# Patient Record
Sex: Female | Born: 1935
Health system: Southern US, Community
[De-identification: ages and names within clinical notes are randomized; demographics above are authoritative.]

## PROBLEM LIST (undated history)

## (undated) DIAGNOSIS — IMO0002 Reserved for concepts with insufficient information to code with codable children: Secondary | ICD-10-CM

## (undated) DIAGNOSIS — H5440 Blindness, one eye, unspecified eye: Secondary | ICD-10-CM

## (undated) DIAGNOSIS — S0591XA Unspecified injury of right eye and orbit, initial encounter: Secondary | ICD-10-CM

## (undated) DIAGNOSIS — K5790 Diverticulosis of intestine, part unspecified, without perforation or abscess without bleeding: Secondary | ICD-10-CM

## (undated) DIAGNOSIS — I10 Essential (primary) hypertension: Secondary | ICD-10-CM

## (undated) DIAGNOSIS — E785 Hyperlipidemia, unspecified: Secondary | ICD-10-CM

## (undated) DIAGNOSIS — K5904 Chronic idiopathic constipation: Secondary | ICD-10-CM

## (undated) DIAGNOSIS — K219 Gastro-esophageal reflux disease without esophagitis: Secondary | ICD-10-CM

## (undated) DIAGNOSIS — K222 Esophageal obstruction: Secondary | ICD-10-CM

## (undated) HISTORY — DX: Essential (primary) hypertension: I10

## (undated) HISTORY — DX: Reserved for concepts with insufficient information to code with codable children: IMO0002

## (undated) HISTORY — PX: EYE SURGERY: SHX253

## (undated) HISTORY — DX: Diverticulosis of intestine, part unspecified, without perforation or abscess without bleeding: K57.90

## (undated) HISTORY — DX: Unspecified injury of right eye and orbit, initial encounter: S05.91XA

## (undated) HISTORY — PX: BREAST REDUCTION SURGERY: SHX8

## (undated) HISTORY — DX: Esophageal obstruction: K22.2

## (undated) HISTORY — DX: Hyperlipidemia, unspecified: E78.5

## (undated) HISTORY — PX: TOTAL ABDOMINAL HYSTERECTOMY W/ BILATERAL SALPINGOOPHORECTOMY: SHX83

## (undated) HISTORY — DX: Blindness, one eye, unspecified eye: H54.40

## (undated) HISTORY — DX: Chronic idiopathic constipation: K59.04

## (undated) HISTORY — PX: ABDOMINAL HYSTERECTOMY: SHX81

---
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
## 1998-02-13 ENCOUNTER — Ambulatory Visit (HOSPITAL_COMMUNITY): Admission: RE | Admit: 1998-02-13 | Discharge: 1998-02-13 | Payer: Self-pay

## 1999-02-14 ENCOUNTER — Ambulatory Visit (HOSPITAL_COMMUNITY): Admission: RE | Admit: 1999-02-14 | Discharge: 1999-02-14 | Payer: Self-pay

## 2000-02-18 ENCOUNTER — Ambulatory Visit (HOSPITAL_COMMUNITY): Admission: RE | Admit: 2000-02-18 | Discharge: 2000-02-18 | Payer: Self-pay | Admitting: Family Medicine

## 2000-02-18 ENCOUNTER — Encounter: Payer: Self-pay | Admitting: Family Medicine

## 2001-02-26 ENCOUNTER — Encounter: Payer: Self-pay | Admitting: Family Medicine

## 2001-02-26 ENCOUNTER — Ambulatory Visit (HOSPITAL_COMMUNITY): Admission: RE | Admit: 2001-02-26 | Discharge: 2001-02-26 | Payer: Self-pay | Admitting: Family Medicine

## 2001-12-28 ENCOUNTER — Other Ambulatory Visit: Admission: RE | Admit: 2001-12-28 | Discharge: 2001-12-28 | Payer: Self-pay | Admitting: Family Medicine

## 2002-03-30 ENCOUNTER — Encounter: Payer: Self-pay | Admitting: Family Medicine

## 2002-03-30 ENCOUNTER — Ambulatory Visit (HOSPITAL_COMMUNITY): Admission: RE | Admit: 2002-03-30 | Discharge: 2002-03-30 | Payer: Self-pay | Admitting: Family Medicine

## 2003-03-29 ENCOUNTER — Encounter: Payer: Self-pay | Admitting: Family Medicine

## 2003-03-29 ENCOUNTER — Ambulatory Visit (HOSPITAL_COMMUNITY): Admission: RE | Admit: 2003-03-29 | Discharge: 2003-03-29 | Payer: Self-pay | Admitting: Family Medicine

## 2003-04-06 ENCOUNTER — Ambulatory Visit (HOSPITAL_COMMUNITY): Admission: RE | Admit: 2003-04-06 | Discharge: 2003-04-06 | Payer: Self-pay | Admitting: Family Medicine

## 2003-04-06 ENCOUNTER — Encounter: Payer: Self-pay | Admitting: Family Medicine

## 2003-07-05 ENCOUNTER — Ambulatory Visit (HOSPITAL_COMMUNITY): Admission: RE | Admit: 2003-07-05 | Discharge: 2003-07-05 | Payer: Self-pay | Admitting: Family Medicine

## 2003-07-05 ENCOUNTER — Encounter: Payer: Self-pay | Admitting: Family Medicine

## 2003-08-05 ENCOUNTER — Encounter: Payer: Self-pay | Admitting: Family Medicine

## 2003-08-05 ENCOUNTER — Ambulatory Visit (HOSPITAL_COMMUNITY): Admission: RE | Admit: 2003-08-05 | Discharge: 2003-08-05 | Payer: Self-pay | Admitting: Family Medicine

## 2004-04-25 ENCOUNTER — Ambulatory Visit (HOSPITAL_COMMUNITY): Admission: RE | Admit: 2004-04-25 | Discharge: 2004-04-25 | Payer: Self-pay | Admitting: Family Medicine

## 2004-05-07 ENCOUNTER — Ambulatory Visit (HOSPITAL_COMMUNITY): Admission: RE | Admit: 2004-05-07 | Discharge: 2004-05-07 | Payer: Self-pay | Admitting: Ophthalmology

## 2004-10-15 ENCOUNTER — Ambulatory Visit (HOSPITAL_COMMUNITY): Admission: RE | Admit: 2004-10-15 | Discharge: 2004-10-15 | Payer: Self-pay | Admitting: Ophthalmology

## 2004-10-20 ENCOUNTER — Observation Stay (HOSPITAL_COMMUNITY): Admission: EM | Admit: 2004-10-20 | Discharge: 2004-10-20 | Payer: Self-pay | Admitting: Emergency Medicine

## 2004-11-27 ENCOUNTER — Ambulatory Visit: Payer: Self-pay | Admitting: Family Medicine

## 2005-03-12 ENCOUNTER — Ambulatory Visit: Payer: Self-pay | Admitting: Family Medicine

## 2005-03-27 ENCOUNTER — Ambulatory Visit (HOSPITAL_COMMUNITY): Admission: RE | Admit: 2005-03-27 | Discharge: 2005-03-27 | Payer: Self-pay | Admitting: Family Medicine

## 2005-04-29 ENCOUNTER — Ambulatory Visit (HOSPITAL_COMMUNITY): Admission: RE | Admit: 2005-04-29 | Discharge: 2005-04-29 | Payer: Self-pay | Admitting: Family Medicine

## 2005-11-26 ENCOUNTER — Ambulatory Visit: Payer: Self-pay | Admitting: Family Medicine

## 2005-12-11 ENCOUNTER — Ambulatory Visit: Payer: Self-pay | Admitting: Family Medicine

## 2006-03-03 ENCOUNTER — Ambulatory Visit: Payer: Self-pay | Admitting: Family Medicine

## 2006-03-27 ENCOUNTER — Other Ambulatory Visit: Admission: RE | Admit: 2006-03-27 | Discharge: 2006-03-27 | Payer: Self-pay | Admitting: Family Medicine

## 2006-03-27 ENCOUNTER — Ambulatory Visit: Payer: Self-pay | Admitting: Family Medicine

## 2006-03-27 ENCOUNTER — Encounter: Payer: Self-pay | Admitting: Family Medicine

## 2006-04-30 ENCOUNTER — Ambulatory Visit (HOSPITAL_COMMUNITY): Admission: RE | Admit: 2006-04-30 | Discharge: 2006-04-30 | Payer: Self-pay | Admitting: Family Medicine

## 2006-09-09 ENCOUNTER — Ambulatory Visit: Payer: Self-pay | Admitting: Pediatrics

## 2007-01-01 ENCOUNTER — Ambulatory Visit: Payer: Self-pay | Admitting: Family Medicine

## 2007-02-05 ENCOUNTER — Encounter: Payer: Self-pay | Admitting: Family Medicine

## 2007-02-05 LAB — CONVERTED CEMR LAB
ALT: 22 units/L (ref 0–35)
Albumin: 4.3 g/dL (ref 3.5–5.2)
Alkaline Phosphatase: 122 units/L — ABNORMAL HIGH (ref 39–117)
BUN: 21 mg/dL (ref 6–23)
Chloride: 106 meq/L (ref 96–112)
Cholesterol: 188 mg/dL (ref 0–200)
Creatinine, Ser: 0.94 mg/dL (ref 0.40–1.20)
Glucose, Bld: 89 mg/dL (ref 70–99)
HDL: 82 mg/dL (ref >39)
Total Protein: 6.8 g/dL (ref 6.0–8.3)
Triglycerides: 139 mg/dL (ref <150)

## 2007-03-28 ENCOUNTER — Encounter (INDEPENDENT_AMBULATORY_CARE_PROVIDER_SITE_OTHER): Payer: Self-pay | Admitting: *Deleted

## 2007-03-28 LAB — CONVERTED CEMR LAB: Pap Smear: NORMAL

## 2007-04-16 ENCOUNTER — Encounter: Payer: Self-pay | Admitting: Family Medicine

## 2007-04-16 ENCOUNTER — Other Ambulatory Visit: Admission: RE | Admit: 2007-04-16 | Discharge: 2007-04-16 | Payer: Self-pay | Admitting: Family Medicine

## 2007-04-16 ENCOUNTER — Ambulatory Visit: Payer: Self-pay | Admitting: Family Medicine

## 2007-05-04 ENCOUNTER — Ambulatory Visit (HOSPITAL_COMMUNITY): Admission: RE | Admit: 2007-05-04 | Discharge: 2007-05-04 | Payer: Self-pay | Admitting: Family Medicine

## 2007-06-11 ENCOUNTER — Ambulatory Visit: Payer: Self-pay | Admitting: Family Medicine

## 2007-06-11 LAB — CONVERTED CEMR LAB
Basophils Absolute: 0 10*3/uL (ref 0.0–0.1)
Basophils Relative: 0 % (ref 0–1)
Creatinine, Ser: 0.9 mg/dL (ref 0.40–1.20)
Eosinophils Absolute: 0.1 10*3/uL (ref 0.0–0.7)
Eosinophils Relative: 1 % (ref 0–5)
HCT: 40.5 % (ref 36.0–46.0)
Lymphs Abs: 1.6 10*3/uL (ref 0.7–3.3)
Monocytes Absolute: 0.6 10*3/uL (ref 0.2–0.7)
Neutrophils Relative %: 59 % (ref 43–77)
Potassium: 4.3 meq/L (ref 3.5–5.3)
RBC: 4.58 M/uL (ref 3.87–5.11)
Sodium: 140 meq/L (ref 135–145)

## 2007-07-09 ENCOUNTER — Ambulatory Visit: Payer: Self-pay | Admitting: Family Medicine

## 2007-07-16 ENCOUNTER — Ambulatory Visit (HOSPITAL_COMMUNITY): Admission: RE | Admit: 2007-07-16 | Discharge: 2007-07-16 | Payer: Self-pay | Admitting: Family Medicine

## 2007-08-20 ENCOUNTER — Ambulatory Visit: Payer: Self-pay | Admitting: Family Medicine

## 2007-10-12 ENCOUNTER — Encounter: Payer: Self-pay | Admitting: Family Medicine

## 2007-10-12 LAB — CONVERTED CEMR LAB
ALT: 26 units/L (ref 0–35)
AST: 22 units/L (ref 0–37)
Albumin: 4.3 g/dL (ref 3.5–5.2)
Alkaline Phosphatase: 82 units/L (ref 39–117)
BUN: 18 mg/dL (ref 6–23)
Basophils Absolute: 0 10*3/uL (ref 0.0–0.1)
Basophils Relative: 0 % (ref 0–1)
Bilirubin, Direct: 0.1 mg/dL (ref 0.0–0.3)
CO2: 26 meq/L (ref 19–32)
Calcium: 9.4 mg/dL (ref 8.4–10.5)
Chloride: 104 meq/L (ref 96–112)
Cholesterol: 190 mg/dL (ref 0–200)
Creatinine, Ser: 0.92 mg/dL (ref 0.40–1.20)
Eosinophils Absolute: 0.1 10*3/uL (ref 0.0–0.7)
Eosinophils Relative: 2 % (ref 0–5)
Glucose, Bld: 95 mg/dL (ref 70–99)
HCT: 42.9 % (ref 36.0–46.0)
HDL: 74 mg/dL (ref >39)
Hemoglobin: 13.7 g/dL (ref 12.0–15.0)
Indirect Bilirubin: 0.4 mg/dL (ref 0.0–0.9)
LDL Cholesterol: 80 mg/dL (ref 0–99)
Lymphocytes Relative: 30 % (ref 12–46)
Lymphs Abs: 1.7 10*3/uL (ref 0.7–3.3)
MCHC: 31.9 g/dL (ref 30.0–36.0)
MCV: 94.5 fL (ref 78.0–100.0)
Monocytes Absolute: 0.6 10*3/uL (ref 0.2–0.7)
Monocytes Relative: 10 % (ref 3–11)
Neutro Abs: 3.1 10*3/uL (ref 1.7–7.7)
Neutrophils Relative %: 57 % (ref 43–77)
Platelets: 286 10*3/uL (ref 150–400)
Potassium: 4.3 meq/L (ref 3.5–5.3)
RBC: 4.54 M/uL (ref 3.87–5.11)
RDW: 13.8 % (ref 11.5–14.0)
Sodium: 141 meq/L (ref 135–145)
Total Bilirubin: 0.5 mg/dL (ref 0.3–1.2)
Total CHOL/HDL Ratio: 2.6
Total Protein: 6.8 g/dL (ref 6.0–8.3)
Triglycerides: 179 mg/dL — ABNORMAL HIGH (ref <150)
VLDL: 36 mg/dL (ref 0–40)
WBC: 5.5 10*3/uL (ref 4.0–10.5)

## 2007-12-12 ENCOUNTER — Encounter (INDEPENDENT_AMBULATORY_CARE_PROVIDER_SITE_OTHER): Payer: Self-pay | Admitting: *Deleted

## 2008-03-28 ENCOUNTER — Ambulatory Visit: Payer: Self-pay | Admitting: Family Medicine

## 2008-03-28 LAB — CONVERTED CEMR LAB
Albumin: 4.6 g/dL (ref 3.5–5.2)
BUN: 19 mg/dL (ref 6–23)
CO2: 25 meq/L (ref 19–32)
Calcium: 9.6 mg/dL (ref 8.4–10.5)
Cholesterol: 215 mg/dL — ABNORMAL HIGH (ref 0–200)
HDL: 85 mg/dL (ref >39)
Total CHOL/HDL Ratio: 2.5
Total Protein: 7 g/dL (ref 6.0–8.3)

## 2008-03-31 DIAGNOSIS — K5730 Diverticulosis of large intestine without perforation or abscess without bleeding: Secondary | ICD-10-CM | POA: Insufficient documentation

## 2008-03-31 DIAGNOSIS — E782 Mixed hyperlipidemia: Secondary | ICD-10-CM | POA: Insufficient documentation

## 2008-03-31 DIAGNOSIS — E785 Hyperlipidemia, unspecified: Secondary | ICD-10-CM

## 2008-03-31 DIAGNOSIS — M810 Age-related osteoporosis without current pathological fracture: Secondary | ICD-10-CM | POA: Insufficient documentation

## 2008-03-31 DIAGNOSIS — H5440 Blindness, one eye, unspecified eye: Secondary | ICD-10-CM

## 2008-03-31 DIAGNOSIS — I10 Essential (primary) hypertension: Secondary | ICD-10-CM | POA: Insufficient documentation

## 2008-03-31 HISTORY — DX: Diverticulosis of large intestine without perforation or abscess without bleeding: K57.30

## 2008-04-04 ENCOUNTER — Ambulatory Visit (HOSPITAL_COMMUNITY): Admission: RE | Admit: 2008-04-04 | Discharge: 2008-04-04 | Payer: Self-pay | Admitting: Family Medicine

## 2008-05-11 ENCOUNTER — Ambulatory Visit (HOSPITAL_COMMUNITY): Admission: RE | Admit: 2008-05-11 | Discharge: 2008-05-11 | Payer: Self-pay | Admitting: Family Medicine

## 2008-07-04 ENCOUNTER — Encounter: Admission: RE | Admit: 2008-07-04 | Discharge: 2008-07-04 | Payer: Self-pay | Admitting: General Surgery

## 2008-10-18 ENCOUNTER — Encounter: Payer: Self-pay | Admitting: Family Medicine

## 2008-12-23 DIAGNOSIS — K5904 Chronic idiopathic constipation: Secondary | ICD-10-CM

## 2008-12-23 HISTORY — DX: Chronic idiopathic constipation: K59.04

## 2009-04-13 ENCOUNTER — Telehealth: Payer: Self-pay | Admitting: Family Medicine

## 2009-05-11 ENCOUNTER — Other Ambulatory Visit: Admission: RE | Admit: 2009-05-11 | Discharge: 2009-05-11 | Payer: Self-pay | Admitting: Family Medicine

## 2009-05-11 ENCOUNTER — Encounter: Payer: Self-pay | Admitting: Family Medicine

## 2009-05-11 ENCOUNTER — Ambulatory Visit: Payer: Self-pay | Admitting: Family Medicine

## 2009-05-11 LAB — CONVERTED CEMR LAB: OCCULT 1: NEGATIVE

## 2009-05-12 ENCOUNTER — Encounter: Payer: Self-pay | Admitting: Family Medicine

## 2009-05-15 ENCOUNTER — Ambulatory Visit (HOSPITAL_COMMUNITY): Admission: RE | Admit: 2009-05-15 | Discharge: 2009-05-15 | Payer: Self-pay | Admitting: Family Medicine

## 2009-05-17 LAB — CONVERTED CEMR LAB
Basophils Absolute: 0 10*3/uL (ref 0.0–0.1)
Chloride: 103 meq/L (ref 96–112)
Cholesterol: 270 mg/dL — ABNORMAL HIGH (ref 0–200)
HCT: 41.9 % (ref 36.0–46.0)
Hemoglobin: 13.9 g/dL (ref 12.0–15.0)
LDL Cholesterol: 154 mg/dL — ABNORMAL HIGH (ref 0–99)
MCHC: 33.2 g/dL (ref 30.0–36.0)
MCV: 89.5 fL (ref 78.0–100.0)
Monocytes Absolute: 0.5 10*3/uL (ref 0.1–1.0)
Neutrophils Relative %: 54 % (ref 43–77)
Potassium: 4.1 meq/L (ref 3.5–5.3)
RBC: 4.68 M/uL (ref 3.87–5.11)
RDW: 14.1 % (ref 11.5–15.5)
TSH: 2.451 microintl units/mL (ref 0.350–4.500)
Total CHOL/HDL Ratio: 3.6
VLDL: 41 mg/dL — ABNORMAL HIGH (ref 0–40)
WBC: 4.5 10*3/uL (ref 4.0–10.5)

## 2009-07-14 ENCOUNTER — Emergency Department (HOSPITAL_COMMUNITY): Admission: EM | Admit: 2009-07-14 | Discharge: 2009-07-14 | Payer: Self-pay | Admitting: Emergency Medicine

## 2009-08-04 ENCOUNTER — Ambulatory Visit: Payer: Self-pay | Admitting: Family Medicine

## 2009-08-04 DIAGNOSIS — R002 Palpitations: Secondary | ICD-10-CM

## 2009-08-07 ENCOUNTER — Encounter: Payer: Self-pay | Admitting: Family Medicine

## 2009-08-09 ENCOUNTER — Encounter: Payer: Self-pay | Admitting: Family Medicine

## 2009-08-15 LAB — CONVERTED CEMR LAB
ALT: 22 units/L (ref 0–35)
AST: 23 units/L (ref 0–37)
Albumin: 4.5 g/dL (ref 3.5–5.2)
Alkaline Phosphatase: 100 units/L (ref 39–117)
Bilirubin, Direct: <0.1 mg/dL (ref 0.0–0.3)
Cholesterol: 319 mg/dL — ABNORMAL HIGH (ref 0–200)
LDL Cholesterol: 198 mg/dL — ABNORMAL HIGH (ref 0–99)
Triglycerides: 193 mg/dL — ABNORMAL HIGH (ref <150)
Vit D, 25-Hydroxy: 41 ng/mL (ref 30–89)

## 2009-09-11 ENCOUNTER — Encounter: Payer: Self-pay | Admitting: Family Medicine

## 2009-09-27 ENCOUNTER — Encounter: Payer: Self-pay | Admitting: Family Medicine

## 2009-10-27 ENCOUNTER — Telehealth: Payer: Self-pay | Admitting: Family Medicine

## 2009-11-06 ENCOUNTER — Ambulatory Visit: Payer: Self-pay | Admitting: Family Medicine

## 2009-11-20 ENCOUNTER — Ambulatory Visit: Payer: Self-pay | Admitting: Gastroenterology

## 2009-11-20 DIAGNOSIS — K219 Gastro-esophageal reflux disease without esophagitis: Secondary | ICD-10-CM

## 2009-11-20 DIAGNOSIS — K5900 Constipation, unspecified: Secondary | ICD-10-CM | POA: Insufficient documentation

## 2009-11-22 ENCOUNTER — Encounter: Payer: Self-pay | Admitting: Gastroenterology

## 2009-11-22 DIAGNOSIS — K222 Esophageal obstruction: Secondary | ICD-10-CM

## 2009-11-22 HISTORY — DX: Esophageal obstruction: K22.2

## 2009-11-22 HISTORY — PX: UPPER GASTROINTESTINAL ENDOSCOPY: SHX188

## 2009-11-22 HISTORY — PX: COLONOSCOPY: SHX174

## 2009-12-05 ENCOUNTER — Ambulatory Visit (HOSPITAL_COMMUNITY): Admission: RE | Admit: 2009-12-05 | Discharge: 2009-12-05 | Payer: Self-pay | Admitting: Gastroenterology

## 2009-12-05 ENCOUNTER — Ambulatory Visit: Payer: Self-pay | Admitting: Gastroenterology

## 2009-12-11 ENCOUNTER — Encounter: Payer: Self-pay | Admitting: Gastroenterology

## 2010-03-23 ENCOUNTER — Encounter: Payer: Self-pay | Admitting: Family Medicine

## 2010-05-09 ENCOUNTER — Encounter: Payer: Self-pay | Admitting: Urgent Care

## 2010-05-18 ENCOUNTER — Ambulatory Visit (HOSPITAL_COMMUNITY): Admission: RE | Admit: 2010-05-18 | Discharge: 2010-05-18 | Payer: Self-pay | Admitting: Family Medicine

## 2010-05-30 ENCOUNTER — Ambulatory Visit (HOSPITAL_COMMUNITY): Admission: RE | Admit: 2010-05-30 | Discharge: 2010-05-30 | Payer: Self-pay | Admitting: Family Medicine

## 2010-06-04 ENCOUNTER — Ambulatory Visit: Payer: Self-pay | Admitting: Family Medicine

## 2010-06-15 ENCOUNTER — Encounter: Payer: Self-pay | Admitting: Family Medicine

## 2011-01-13 ENCOUNTER — Encounter (HOSPITAL_BASED_OUTPATIENT_CLINIC_OR_DEPARTMENT_OTHER): Payer: Self-pay | Admitting: Internal Medicine

## 2011-01-24 ENCOUNTER — Ambulatory Visit (INDEPENDENT_AMBULATORY_CARE_PROVIDER_SITE_OTHER): Payer: Medicare PPO | Admitting: Family Medicine

## 2011-01-24 ENCOUNTER — Encounter: Payer: Self-pay | Admitting: Family Medicine

## 2011-01-24 DIAGNOSIS — K5730 Diverticulosis of large intestine without perforation or abscess without bleeding: Secondary | ICD-10-CM

## 2011-01-24 DIAGNOSIS — M810 Age-related osteoporosis without current pathological fracture: Secondary | ICD-10-CM

## 2011-01-24 DIAGNOSIS — E785 Hyperlipidemia, unspecified: Secondary | ICD-10-CM

## 2011-01-24 DIAGNOSIS — I10 Essential (primary) hypertension: Secondary | ICD-10-CM

## 2011-01-24 NOTE — Medication Information (Signed)
Summary: omeprazole refill  omeprazole refill   Imported By: Hendricks Limes LPN 62/13/0865 78:46:96  _____________________________________________________________________  External Attachment:    Type:   Image     Comment:   External Document

## 2011-01-24 NOTE — Progress Notes (Signed)
[facility]   Imported By: Lind Guest 06/20/2010 11:17:15  _____________________________________________________________________  External Attachment:    Type:   Image     Comment:   External Document

## 2011-01-24 NOTE — Medication Information (Signed)
Summary: PA for omeprazole  PA for omeprazole   Imported By: Hendricks Limes LPN 16/09/9603 54:09:81  _____________________________________________________________________  External Attachment:    Type:   Image     Comment:   External Document

## 2011-01-24 NOTE — Assessment & Plan Note (Signed)
Summary: office visit   Vital Signs:  Patient profile:   77 year old female Menstrual status:  hysterectomy Height:      65.5 inches Weight:      144 pounds BMI:     23.68 O2 Sat:      98 % Pulse rate:   80 / minute Resp:     16 per minute BP sitting:   120 / 78  (left arm) Cuff size:   regular  Vitals Entered By: Everitt Amber LPN (June 04, 2010 10:31 AM) CC: Follow up chronic problems   Primary Care Provider:  Syliva Overman, MD  CC:  Follow up chronic problems.  History of Present Illness: Reports  that she has been doing well. Denies recent fever or chills. Denies sinus pressure, nasal congestion , ear pain or sore throat. Denies chest congestion, or cough productive of sputum. Denies chest pain, palpitations, PND, orthopnea or leg swelling. Denies abdominal pain, nausea, vomitting, diarrhea or constipation. Denies change in bowel movements or bloody stool. Denies dysuria , frequency, incontinence or hesitancy. Denies  joint pain, swelling, or reduced mobility. Denies headaches, vertigo, seizures. Denies depression, anxiety or insomnia. Denies  rash, lesions, or itch.     Current Medications (verified): 1)  Aspirin 81 Mg  Tbec (Aspirin) .... One Tab By Mouth Once Daily 2)  Diphenhydramine Hcl 25 Mg  Tabs (Diphenhydramine Hcl) .... One Tab By Mouth Once Daily As Needed 3)  Multivitamin/iron   Tabs (Multiple Vitamins-Iron) .... One Tab By Mouth Once Daily 4)  Ramipril 10 Mg  Caps (Ramipril) .... One 1/2 Cap By Mouth Once Daily 5)  Calcium + D + K 750-500-40 Mg-Unt-Mcg  Tabs (Calcium-Vitamin D-Vitamin K) .... Two Tabs By Mouth Once Daily 6)  Fish Oil 1200 Mg Caps (Omega-3 Fatty Acids) .... Take 1 Tablet By Mouth Two Times A Day 7)  Vitamin D (Ergocalciferol) 50000 Unit Caps (Ergocalciferol) .... Take One Tab Once A Week 8)  Crestor 40 Mg Tabs (Rosuvastatin Calcium) .... Take Onehalf Tab At Bedtime 9)  Omeprazole 20 Mg Cpdr (Omeprazole) .... Take 1 Tablet By Mouth  Once A Day  Allergies (verified): No Known Drug Allergies  Review of Systems      See HPI Eyes:  Complains of vision loss-both eyes. Endo:  Denies cold intolerance, excessive hunger, excessive thirst, excessive urination, heat intolerance, polyuria, and weight change. Heme:  Denies abnormal bruising and bleeding. Allergy:  Denies hives or rash and itching eyes.  Physical Exam  General:  Well-developed,well-nourished,in no acute distress; alert,appropriate and cooperative throughout examination HEENT: No facial asymmetry,  EOMI, No sinus tenderness, TM's Clear, oropharynx  pink and moist.   Chest: Clear to auscultation bilaterally.  CVS: S1, S2, No murmurs, No S3.   Abd: Soft, Nontender.  MS: Adequate ROM spine, hips, shoulders and knees.  Ext: No edema.   CNS: CN 2-12 intact, power tone and sensation normal throughout.   Skin: Intact, no visible lesions or rashes.  Psych: Good eye contact, normal affect.  Memory intact, not anxious or depressed appearing.    Impression & Recommendations:  Problem # 1:  GERD (ICD-530.81) Assessment Unchanged  Her updated medication list for this problem includes:    Omeprazole 20 Mg Cpdr (Omeprazole) .[name]... Take 1 tablet by mouth once a day  Problem # 2:  HYPERTENSION (ICD-401.9) Assessment: Unchanged  Her updated medication list for this problem includes:    Ramipril 10 Mg Caps (Ramipril) ..... One 1/2 cap by mouth once daily  Orders: T-Basic Metabolic Panel ([id number])  BP today: 120/78 Prior BP: 122/78 (11/20/2009)  Labs Reviewed: K+: 4.1 (05/12/2009) Creat: : 0.85 (05/12/2009)   Chol: 319 (08/04/2009)   HDL: 82 (08/04/2009)   LDL: 198 (08/04/2009)   TG: 193 (08/04/2009)  Problem # 3:  HYPERLIPIDEMIA (ICD-272.4) Assessment: Comment Only  The following medications were removed from the medication list:    Crestor 40 Mg Tabs (Rosuvastatin calcium) .[name]... Take onehalf tab at bedtime Her updated medication list for this problem  includes:    Crestor 10 Mg Tabs (Rosuvastatin calcium) .[name]... Take 1 tab by mouth at bedtime  Orders: T-Hepatic Function [id number]) T-Lipid Profile [id number])  Labs Reviewed: SGOT: 23 (08/04/2009)   SGPT: 22 (08/04/2009)   HDL:82 (08/04/2009), 75 (05/12/2009)  LDL:198 (08/04/2009), 154 (05/12/2009)  Chol:319 (08/04/2009), 270 (05/12/2009)  Trig:193 (08/04/2009), 203 (05/12/2009)  Complete Medication List: 1)  Aspirin 81 Mg Tbec (Aspirin) .... One tab by mouth once daily 2)  Diphenhydramine Hcl 25 Mg Tabs (Diphenhydramine hcl) .... One tab by mouth once daily as needed 3)  Multivitamin/iron Tabs (Multiple vitamins-iron) .... One tab by mouth once daily 4)  Ramipril 10 Mg Caps (Ramipril) .... One 1/2 cap by mouth once daily 5)  Calcium + D + K 750-500-40 Mg-unt-mcg Tabs (Calcium-vitamin d-vitamin k) .... Two tabs by mouth once daily 6)  Fish Oil 1200 Mg Caps (Omega-3 fatty acids) .... Take 1 tablet by mouth two times a day 7)  Omeprazole 20 Mg Cpdr (Omeprazole) .... Take 1 tablet by mouth once a day 8)  Crestor 10 Mg Tabs (Rosuvastatin calcium) .... Take 1 tab by mouth at bedtime  Patient Instructions: 1)  F/u in 5months and 5 and a half weeks.At 8am or 1pm. 2)  No med changes at this time. 3)  BMP prior to visit, ICD-9: 4)  Hepatic Panel prior to visit, ICD-9:   fasting in 5 and a half mionths  5)  Lipid Panel prior to visit, ICD-9: Prescriptions: CRESTOR 10 MG TABS (ROSUVASTATIN CALCIUM) Take 1 tab by mouth at bedtime  #30 x 5   Entered and Authorized by:   Syliva Overman MD   Signed by:   Syliva Overman MD on 06/04/2010   Method used:   Historical   RxID:   [id number]

## 2011-01-25 NOTE — Progress Notes (Signed)
[facility]   Imported By: Lind Guest 03/28/2010 13:57:50  _____________________________________________________________________  External Attachment:    Type:   Image     Comment:   External Document

## 2011-01-28 ENCOUNTER — Encounter: Payer: Self-pay | Admitting: Family Medicine

## 2011-01-28 ENCOUNTER — Encounter (INDEPENDENT_AMBULATORY_CARE_PROVIDER_SITE_OTHER): Payer: Self-pay

## 2011-01-28 LAB — CONVERTED CEMR LAB
ALT: 17 units/L (ref 0–35)
AST: 23 units/L (ref 0–37)
Alkaline Phosphatase: 104 units/L (ref 39–117)
Basophils Relative: 1 % (ref 0–1)
Bilirubin, Direct: 0.1 mg/dL (ref 0.0–0.3)
CO2: 30 meq/L (ref 19–32)
Creatinine, Ser: 0.84 mg/dL (ref 0.40–1.20)
HCT: 41.1 % (ref 36.0–46.0)
Indirect Bilirubin: 0.3 mg/dL (ref 0.0–0.9)
LDL Cholesterol: 82 mg/dL (ref 0–99)
Lymphocytes Relative: 30 % (ref 12–46)
Lymphs Abs: 1.6 10*3/uL (ref 0.7–4.0)
MCHC: 32.8 g/dL (ref 30.0–36.0)
Platelets: 264 10*3/uL (ref 150–400)
Potassium: 4.3 meq/L (ref 3.5–5.3)
RDW: 13.8 % (ref 11.5–15.5)
Sodium: 141 meq/L (ref 135–145)
Total Bilirubin: 0.4 mg/dL (ref 0.3–1.2)
Total CHOL/HDL Ratio: 2.2
Total Protein: 6.9 g/dL (ref 6.0–8.3)

## 2011-01-30 NOTE — Assessment & Plan Note (Signed)
Summary: office visit   Vital Signs:  Patient profile:   77 year old female Menstrual status:  hysterectomy Height:      65.5 inches Weight:      143 pounds BMI:     23.52 O2 Sat:      98 % Pulse rate:   82 / minute Pulse rhythm:   regular Resp:     16 per minute BP sitting:   122 / 80  (left arm) Cuff size:   regular  Vitals Entered By: Everitt Amber LPN (January 24, 2011 2:31 PM) CC: Follow up chronic problems   Primary Care Provider:  Syliva Overman, MD  CC:  Follow up chronic problems.  History of Present Illness: Reports  that she has been doing well except for black stool in the past 3months Denies recent fever or chills. Denies sinus pressure, nasal congestion , ear pain or sore throat. Denies chest congestion, or cough productive of sputum. Denies chest pain, palpitations, PND, orthopnea or leg swelling. Denies abdominal pain, nausea, vomitting, diarrhea or constipation. Denies change in bowel movements or bloody stool. Denies dysuria , frequency, incontinence or hesitancy. Denies  joint pain, swelling, or reduced mobility. Denies headaches, vertigo, seizures. Denies depression, anxiety or insomnia. Denies  rash, lesions, or itch.     Current Medications (verified): 1)  Aspirin 81 Mg  Tbec (Aspirin) .... One Tab By Mouth Once Daily 2)  Diphenhydramine Hcl 25 Mg  Tabs (Diphenhydramine Hcl) .... One Tab By Mouth Once Daily As Needed 3)  Multivitamin/iron   Tabs (Multiple Vitamins-Iron) .... One Tab By Mouth Once Daily 4)  Ramipril 10 Mg  Caps (Ramipril) .... One 1/2 Cap By Mouth Once Daily 5)  Calcium 1000mg  .... Take 2 Tabs Daily 6)  Fish Oil 1200 Mg Caps (Omega-3 Fatty Acids) .... Take 1 Tablet By Mouth Two Times A Day 7)  Omeprazole 20 Mg Cpdr (Omeprazole) .... Take 1 Tablet By Mouth Once A Day 8)  Crestor 10 Mg Tabs (Rosuvastatin Calcium) .... Take 1 Tab By Mouth At Bedtime 9)  Gnp Magnesium 250 Mg Tabs (Magnesium) .... 2 Tabs Once Daily 10)  Vitamin D  1000 Unit Tabs (Cholecalciferol) .... 2 Tabs Once Daily  Allergies (verified): No Known Drug Allergies  Review of Systems      See HPI Eyes:  Complains of vision loss-both eyes. GI:  Complains of dark tarry stools; 3 month h/o dark stool, no change noted in apetite, stool form , and no weight loss, 2 aunts had colon ca. Endo:  Denies cold intolerance, excessive hunger, excessive thirst, excessive urination, and heat intolerance. Heme:  Denies abnormal bruising and bleeding. Allergy:  Denies hives or rash and itching eyes.  Physical Exam  General:  Well-developed,well-nourished,in no acute distress; alert,appropriate and cooperative throughout examination HEENT: No facial asymmetry,  EOMI, No sinus tenderness, TM's Clear, oropharynx  pink and moist.   Chest: Clear to auscultation bilaterally.  CVS: S1, S2, No murmurs, No S3.   Abd: Soft, Nontender.  MS: Adequate ROM spine, hips, shoulders and knees.  Ext: No edema.   CNS: CN 2-12 intact, power tone and sensation normal throughout.   Skin: Intact, no visible lesions or rashes.  Psych: Good eye contact, normal affect.  Memory intact, not anxious or depressed appearing. Rectal: internal hemmorhoids, guaic neg brown stool    Impression & Recommendations:  Problem # 1:  GERD (ICD-530.81) Assessment Improved  Her updated medication list for this problem includes:    Omeprazole 20  Mg Cpdr (Omeprazole) .[name]... Take 1 tablet by mouth once a day  Problem # 2:  HYPERLIPIDEMIA (ICD-272.4) Assessment: Comment Only  Her updated medication list for this problem includes:    Crestor 10 Mg Tabs (Rosuvastatin calcium) .[name]... Take 1 tab by mouth at bedtime Low fat dietdiscussed and encouraged   Orders: T-Hepatic Function [id number]) T-Lipid Profile [id number])  Labs Reviewed: SGOT: 23 (08/04/2009)   SGPT: 22 (08/04/2009)   HDL:82 (08/04/2009), 75 (05/12/2009)  LDL:198 (08/04/2009), 154 (05/12/2009)  Chol:319 (08/04/2009), 270  (05/12/2009)  Trig:193 (08/04/2009), 203 (05/12/2009)  Problem # 3:  HYPERTENSION (ICD-401.9) Assessment: Unchanged  The following medications were removed from the medication list:    Ramipril 10 Mg Caps (Ramipril) ..... One 1/2 cap by mouth once daily Her updated medication list for this problem includes:    Ramipril 5 Mg Caps (Ramipril) .[name]... Take 1 capsule by mouth once a day  Orders: T-Basic Metabolic Panel [id number])  BP today: 122/80 Prior BP: 120/78 (06/04/2010)  Labs Reviewed: K+: 4.1 (05/12/2009) Creat: : 0.85 (05/12/2009)   Chol: 319 (08/04/2009)   HDL: 82 (08/04/2009)   LDL: 198 (08/04/2009)   TG: 193 (08/04/2009)  Complete Medication List: 1)  Aspirin 81 Mg Tbec (Aspirin) .... One tab by mouth once daily 2)  Diphenhydramine Hcl 25 Mg Tabs (Diphenhydramine hcl) .... One tab by mouth once daily as needed 3)  Multivitamin/iron Tabs (Multiple vitamins-iron) .... One tab by mouth once daily 4)  Calcium 1000mg   .... Take 2 tabs daily 5)  Fish Oil 1200 Mg Caps (Omega-3 fatty acids) .... Take 1 tablet by mouth two times a day 6)  Omeprazole 20 Mg Cpdr (Omeprazole) .... Take 1 tablet by mouth once a day 7)  Crestor 10 Mg Tabs (Rosuvastatin calcium) .... Take 1 tab by mouth at bedtime 8)  Gnp Magnesium 250 Mg Tabs (Magnesium) .... 2 tabs once daily 9)  Vitamin D 1000 Unit Tabs (Cholecalciferol) .... 2 tabs once daily 10)  Ramipril 5 Mg Caps (Ramipril) .... Take 1 capsule by mouth once a day  Other Orders: T-CBC w/Diff ([id number]) T-TSH [id number])  Patient Instructions: 1)  Follow up appointment in 5.35months 2)  It is important that you exercise regularly at least 20 minutes 5 times a week. If you develop chest pain, have severe difficulty breathing, or feel very tired , stop exercising immediately and seek medical attention. 3)  BMP prior to visit, ICD-9: 4)  Hepatic Panel prior to visit, ICD-9: 5)  Lipid Panel prior to visit, ICD-9: fasting asap 6)  TSH prior to  visit, ICD-9: 7)  CBC w/ Diff prior to visit, ICD-9: 8)  Pls return 3 stool cards at your earliest convenience once you have black stool off of aspirin. 9)  No med changes at this time. 10)  We will give you info on hot flashes  (good luck)   Orders Added: 1)  Est. Patient Level IV [40347] 2)  T-Basic Metabolic Panel [[id number]] 3)  T-Hepatic Function [[id number]] 4)  T-Lipid Profile [[id number]] 5)  T-CBC w/Diff [[id number]] 6)  T-TSH [[id number]]

## 2011-02-07 NOTE — Letter (Signed)
Summary: Laboratory/X-Ray Results  [facility]  [address]   [place], [place] [address]   Phone: [phone]  Fax: [phone]    Lab/X-Ray Results  January 28, 2011  MRN: [id number]  [facility] [address] [place], [place]  [address]  [place]    The results of your recent lab/x-ray has been reviewed and were found:    If you have any questions, please contact our office.     Everitt Amber LPN

## 2011-02-07 NOTE — Letter (Signed)
Summary: Letter  Letter   Imported By: Lind Guest 01/29/2011 14:51:19  _____________________________________________________________________  External Attachment:    Type:   Image     Comment:   External Document

## 2011-03-31 LAB — URINE MICROSCOPIC-ADD ON

## 2011-03-31 LAB — BASIC METABOLIC PANEL
BUN: 19 mg/dL (ref 6–23)
CO2: 25 mEq/L (ref 19–32)
Calcium: 9.3 mg/dL (ref 8.4–10.5)
Creatinine, Ser: 0.84 mg/dL (ref 0.4–1.2)
GFR calc Af Amer: >60 mL/min (ref >60)
GFR calc non Af Amer: >60 mL/min (ref >60)
Glucose, Bld: 107 mg/dL — ABNORMAL HIGH (ref 70–99)

## 2011-03-31 LAB — URINALYSIS, ROUTINE W REFLEX MICROSCOPIC
Nitrite: NEGATIVE
Protein, ur: NEGATIVE mg/dL
Specific Gravity, Urine: <1.005 — ABNORMAL LOW (ref 1.005–1.030)

## 2011-03-31 LAB — CBC
Hemoglobin: 13.5 g/dL (ref 12.0–15.0)
MCHC: 34.9 g/dL (ref 30.0–36.0)
RDW: 13.3 % (ref 11.5–15.5)

## 2011-03-31 LAB — DIFFERENTIAL
Basophils Absolute: 0 10*3/uL (ref 0.0–0.1)
Eosinophils Absolute: 0.1 10*3/uL (ref 0.0–0.7)
Eosinophils Relative: 2 % (ref 0–5)
Monocytes Absolute: 0.7 10*3/uL (ref 0.1–1.0)
Monocytes Relative: 11 % (ref 3–12)

## 2011-04-29 ENCOUNTER — Telehealth: Payer: Self-pay | Admitting: Family Medicine

## 2011-04-29 NOTE — Telephone Encounter (Signed)
See response

## 2011-04-29 NOTE — Telephone Encounter (Signed)
Patient coming Wednesday at 3:30 for nurse visit ear flush

## 2011-04-29 NOTE — Telephone Encounter (Signed)
This sounds like possible wax, so I think a nurse visit should be fine, ask her if ear hurts, fever, chills or sympts suggesting infection,if not nurse visit is fine, I will look in the ear before and after

## 2011-04-29 NOTE — Telephone Encounter (Signed)
Does patient need to go to urgent care?

## 2011-04-29 NOTE — Telephone Encounter (Signed)
Called patient, left message.

## 2011-05-01 ENCOUNTER — Ambulatory Visit (INDEPENDENT_AMBULATORY_CARE_PROVIDER_SITE_OTHER): Payer: Medicare PPO | Admitting: Gastroenterology

## 2011-05-01 ENCOUNTER — Telehealth: Payer: Self-pay | Admitting: Family Medicine

## 2011-05-01 ENCOUNTER — Encounter: Payer: Self-pay | Admitting: Gastroenterology

## 2011-05-01 DIAGNOSIS — K5900 Constipation, unspecified: Secondary | ICD-10-CM

## 2011-05-01 MED ORDER — LUBIPROSTONE 24 MCG PO CAPS: ORAL_CAPSULE | ORAL | Status: DC

## 2011-05-01 NOTE — Telephone Encounter (Signed)
Kelsey Nelson came back in and she is going to see Dr.  Darrick Penna today

## 2011-05-01 NOTE — Telephone Encounter (Signed)
noted 

## 2011-05-01 NOTE — Assessment & Plan Note (Addendum)
Intermittent constipation in the past. Sx worse for 2 weeks. No warning signs. No indication for repeat TCS. No definite S/Sx of diverticulitis.   Take Calcium 30 mins to 1 hour after meals. Add Amitiza. Continue water, fiber, and exercise. OPV in 1 month.

## 2011-05-01 NOTE — Patient Instructions (Signed)
Take Amitiza at bedtime for 7 days then take it twice a day. Continue to exercise. Continue to drink water and eat fiber. Follow up in 1 month. Call Dr. Darrick Penna if you see red, grey/clay colored, or black tarry stools.

## 2011-05-01 NOTE — Progress Notes (Signed)
  Subjective:    Patient ID: Kelsey Nelson, female    DOB: 07/19/1934, 77 y.o.   MRN: [id number]  PCP: [facility]  HPI  Gets gas when she's constipated and makes her back hurt. GETS PAIN EVERY 3-4 MOS. Bms: usu. Every day but for the past 2 weeks has had trouble moving her bowels. BMs: dark green, not black, red,or white. Weight loss 6 lbs since 2010. Pt states has happened in the past month. Constipation first then decreased appetite for past couple of weeks. Can't pinpoint a trigger. No new stress. Last night felt "chills". Hasn't seen Dr. Lodema Hong. No itching or eyes turning yellow. One night very nauseous, last SUN. No vomiting. REFLUX IS CONTROLLED. No new meds.  Past Medical History  Diagnosis Date  . Diverticulosis     [place]  . Hyperlipemia   . HTN (hypertension)   . Functional constipation 2010 INTERMITTENT  . Blindness of right eye   . BMI between 19-24,adult 2010 145 LBS  . Schatzki's ring DEC 2010  . Diverticula of small intestine DEC 2010 TWO   Past Surgical History  Procedure Date  . Total abdominal hysterectomy w/ bilateral salpingoophorectomy 1985 FIBROIDS  . Eye surgery RIGHT 2005  . Breast reduction surgery 1992 BIL  . Colonoscopy DEC 2010    SIMPLE ADENOMAS (6-8), [place] TICS, SML IH  . Upper gastrointestinal endoscopy DEC 2010    Bx-MILD GASTRITIS/DUODENITIS, DUODENAL DIVERTICULA, SCHATZKI'S RING   No Known Allergies  Current Outpatient Prescriptions  Medication Sig Dispense Refill  . Calcium Carb-Cholecalciferol (CALCIUM 1000 + D PO) Take by mouth.        . Flaxseed, Linseed, (FLAX SEEDS PO) Take by mouth.        . magnesium gluconate (MAGONATE) 500 MG tablet Take 500 mg by mouth 2 (two) times daily.        . Multiple Vitamin (MULTIVITAMIN) capsule Take 1 capsule by mouth daily.        [name] omeprazole (PRILOSEC) 20 MG capsule Take 20 mg by mouth daily.        . ramipril (ALTACE) 5 MG capsule Take 5 mg by mouth daily.        . rosuvastatin (CRESTOR) 10 MG tablet Take 10  mg by mouth daily.        [name] lubiprostone (AMITIZA) 24 MCG capsule 1 po BID  60 capsule  11   Review of Systems FEB 2012 CA NL  TSH NL    Objective:   Physical Exam  Constitutional: She is oriented to person, place, and time. She appears well-developed and well-nourished. No distress.  HENT:  Head: Normocephalic and atraumatic.  Mouth/Throat: Oropharynx is clear and moist.  Eyes: No scleral icterus.  Neck: Normal range of motion. Neck supple.  Cardiovascular: Normal rate and regular rhythm.   Pulmonary/Chest: Effort normal and breath sounds normal.  Abdominal: Soft. Bowel sounds are normal. There is tenderness.       MILD TTP IN BLQ  Musculoskeletal: She exhibits no edema.  Lymphadenopathy:    She has no cervical adenopathy.  Neurological: She is oriented to person, place, and time.  Skin: Skin is warm and dry.          Assessment & Plan:

## 2011-05-01 NOTE — Progress Notes (Signed)
Pt is aware of OV for 6/13 @ 10 am with [facility]

## 2011-05-02 ENCOUNTER — Telehealth: Payer: Self-pay

## 2011-05-02 NOTE — Telephone Encounter (Signed)
Pt was informed. Said she will try, although she did the one last night with food and was sick in 15 min. She will try again and let us know.

## 2011-05-02 NOTE — Telephone Encounter (Signed)
Please call pt. She is to take only one at bedtime for 7 days then increase to one twice a day. If she has vomiting with just one pill at bedtime, then we will decrease her dose to 8 mcg daily.

## 2011-05-02 NOTE — Telephone Encounter (Signed)
Pt called and said she was given samples of Amitiza 24 mcg yesterday. She took one last night and one this AM ( with food ) and both times she got very sick and started vomiting about 15 min after. Please advise!

## 2011-05-02 NOTE — Progress Notes (Signed)
Cc to PCP 

## 2011-05-06 ENCOUNTER — Other Ambulatory Visit: Payer: Self-pay

## 2011-05-06 MED ORDER — OMEPRAZOLE 20 MG PO CPDR: 20.0000 mg | DELAYED_RELEASE_CAPSULE | Freq: Every day | ORAL | Status: DC

## 2011-05-07 ENCOUNTER — Telehealth: Payer: Self-pay

## 2011-05-07 ENCOUNTER — Encounter: Payer: Self-pay | Admitting: Gastroenterology

## 2011-05-07 MED ORDER — OMEPRAZOLE 20 MG PO CPDR: 20.0000 mg | DELAYED_RELEASE_CAPSULE | Freq: Every day | ORAL | Status: DC

## 2011-05-07 NOTE — Telephone Encounter (Signed)
Please disregard electronic RX Fax printed Rx to [facility]

## 2011-05-07 NOTE — Telephone Encounter (Signed)
Pt's husband came by and brought a note of her problems. Can't have a BM without a laxative. The Amitiza produces a little result for one time but then nothing. Said if she doesn't go to bed immediately after taking it she vomits. With or without medications she has nausea most of the time. Last several nights she has had chills and then sweats. I asked Darl Pikes to made her an appt to see Dr. Darrick Penna, she has an appt on 05/08/2011.

## 2011-05-07 NOTE — Telephone Encounter (Signed)
Faxed Rx to [facility]

## 2011-05-08 ENCOUNTER — Ambulatory Visit (HOSPITAL_COMMUNITY)
Admission: RE | Admit: 2011-05-08 | Discharge: 2011-05-08 | Disposition: A | Discharge status: Home / Self Care | Payer: Medicare PPO | Source: Ambulatory Visit | Attending: Gastroenterology | Admitting: Gastroenterology

## 2011-05-08 ENCOUNTER — Ambulatory Visit (INDEPENDENT_AMBULATORY_CARE_PROVIDER_SITE_OTHER): Payer: Medicare PPO | Admitting: Gastroenterology

## 2011-05-08 ENCOUNTER — Encounter: Payer: Self-pay | Admitting: Gastroenterology

## 2011-05-08 VITALS — BP 126/77 | HR 83 | Temp 97.6°F | Ht 65.0 in | Wt 139.8 lb

## 2011-05-08 DIAGNOSIS — K5900 Constipation, unspecified: Secondary | ICD-10-CM

## 2011-05-08 DIAGNOSIS — N289 Disorder of kidney and ureter, unspecified: Secondary | ICD-10-CM | POA: Insufficient documentation

## 2011-05-08 DIAGNOSIS — R110 Nausea: Secondary | ICD-10-CM | POA: Insufficient documentation

## 2011-05-08 DIAGNOSIS — K5909 Other constipation: Secondary | ICD-10-CM | POA: Insufficient documentation

## 2011-05-08 DIAGNOSIS — R634 Abnormal weight loss: Secondary | ICD-10-CM | POA: Insufficient documentation

## 2011-05-08 DIAGNOSIS — K5730 Diverticulosis of large intestine without perforation or abscess without bleeding: Secondary | ICD-10-CM | POA: Insufficient documentation

## 2011-05-08 DIAGNOSIS — R1013 Epigastric pain: Secondary | ICD-10-CM

## 2011-05-08 DIAGNOSIS — K3189 Other diseases of stomach and duodenum: Secondary | ICD-10-CM

## 2011-05-08 LAB — COMPREHENSIVE METABOLIC PANEL
AST: 32 U/L (ref 0–37)
Albumin: 4.5 g/dL (ref 3.5–5.2)
BUN: 15 mg/dL (ref 6–23)
CO2: 27 mEq/L (ref 19–32)
Calcium: 9.9 mg/dL (ref 8.4–10.5)
Chloride: 104 mEq/L (ref 96–112)
Creat: 1.01 mg/dL (ref 0.40–1.20)
Glucose, Bld: 92 mg/dL (ref 70–99)
Potassium: 5 mEq/L (ref 3.5–5.3)

## 2011-05-08 LAB — CREATININE, SERUM
Creatinine, Ser: 0.85 mg/dL (ref 0.4–1.2)
GFR calc non Af Amer: >60 mL/min (ref >60)

## 2011-05-08 MED ORDER — IOHEXOL 300 MG/ML  SOLN
100.0000 mL | Freq: Once | INTRAMUSCULAR | Status: AC | PRN
  Administered 2011-05-08: 100 mL via INTRAVENOUS

## 2011-05-08 NOTE — Assessment & Plan Note (Addendum)
Most likely functional constipation. Differential diagnosis includes occult malignancy (pancreatic), obstruction from stricture due to previous surgery, and less likely diverticulitis.  STOP TAKING CALCIUM. CT SCAN TODAY TO EVALUATE nausea, vomiting, and change in bowel habits. Continue Amitiza. May try AMITIZA 8 MCG AT BEDTIME INSTEAD OF 24 MCG TABLETS to see if you have less side effects. FOLLOW UP IN ONE MONTH.

## 2011-05-08 NOTE — Assessment & Plan Note (Addendum)
Most likely 2o to non-ulcer dyspepsia or uncontrolled GERD. Pt has hx: PUD and continues on ASA.  Take OMP 30 minutes prior to first meal. CT SCAN TODAY. UPPER ENDOSCOPY TOMORROW. FOLLOW UP IN ONE MONTH.  Pt given [facility] & Dr. Suszanne Conners.

## 2011-05-08 NOTE — Progress Notes (Signed)
Cc to PCP 

## 2011-05-08 NOTE — Progress Notes (Signed)
Pt is aware of OV on 06/05/11 @ 1000 with [facility]

## 2011-05-08 NOTE — Patient Instructions (Addendum)
STOP TAKING CALCIUM. Take OMEPRAZOLE 30 minutes prior to your first meal. CT SCAN TODAY. UPPER ENDOSCOPY TOMORROW. TRY AMITIZA 8 MCG AT BEDTIME INSTEAD OF 24 MCG TABLETS. FOLLOW UP IN ONE MONTH.

## 2011-05-08 NOTE — Telephone Encounter (Signed)
OPV today.

## 2011-05-08 NOTE — Progress Notes (Signed)
Subjective:     Patient ID: Kelsey Nelson, female   DOB: 1934-06-08, 77 y.o.   MRN: [id number]  HPI Still only able to have a BM with AMitiza 24 mcg at bedtime. Initially nausea and vomiting after taking meds. Better if takes it and goes to bed. BMs: once a day ("verry loose"). Amitiza does work. Concerned that she needs laxative to have a BM. Having night sweats/subjective chills-3x at night since last visit. Nausea: most of the time. Meds tried: none. No heartburn or indigestion. Has burping a lot. NEVER HAD DIVERTICULITIS. NO PAIN WITH CONSTIPATION OR problems swallowing, dysuria, or hematuria, bld in stool, or blk tarry stools. Takes ASA daily. No BC, Goody's, Ibuprofen, Motrin or Aleve.   Past Medical History  Diagnosis Date  . Diverticulosis     [place]  . Hyperlipemia   . HTN (hypertension)   . Functional constipation 2010 INTERMITTENT  . Blindness of right eye   . BMI between 19-24,adult 2010 145 LBS  . Schatzki's ring DEC 2010  . Diverticula of small intestine DEC 2010 TWO   Past Surgical History  Procedure Date  . Total abdominal hysterectomy w/ bilateral salpingoophorectomy 1985 FIBROIDS  . Eye surgery RIGHT 2005  . Breast reduction surgery 1992 BIL  . Colonoscopy DEC 2010    SIMPLE ADENOMAS (6-8),  TICS, SML IH  . Upper gastrointestinal endoscopy DEC 2010    Bx-MILD GASTRITIS/DUODENITIS, DUODENAL DIVERTICULA, SCHATZKI'S RING   No Known Allergies  Current Outpatient Prescriptions  Medication Sig Dispense Refill  . Calcium Carb-Cholecalciferol (CALCIUM 1000 + D PO) Take by mouth.        . Flaxseed, Linseed, (FLAX SEEDS PO) Take by mouth.        . lubiprostone (AMITIZA) 24 MCG capsule 1 po QHS      . magnesium gluconate (MAGONATE) 500 MG tablet Take 500 mg by mouth 2 (two) times daily.        . Multiple Vitamin (MULTIVITAMIN) capsule Take 1 capsule by mouth daily.        [name] omeprazole (PRILOSEC) 20 MG capsule Take 1 capsule (20 mg total) by mouth daily.      . ramipril  (ALTACE) 5 MG capsule Take 5 mg by mouth daily.        . rosuvastatin (CRESTOR) 10 MG tablet Take 10 mg by mouth daily.         Review of Systems  All other systems reviewed and are negative.  Pt c/o ear problems.     Objective:   Physical Exam  Constitutional: She is oriented to person, place, and time. She appears well-developed and well-nourished. No distress.  HENT:  Head: Normocephalic and atraumatic.  Mouth/Throat: Oropharynx is clear and moist.       No lesion  Eyes: No scleral icterus.       Right pupil defect, left pupil reactive  Neck: Normal range of motion. Neck supple.  Cardiovascular: Normal rate and regular rhythm.   No murmur heard. Pulmonary/Chest: Effort normal and breath sounds normal. She has no wheezes.  Abdominal: Soft. Bowel sounds are normal. She exhibits no distension. There is no tenderness. There is no rebound and no guarding.  Musculoskeletal: She exhibits no edema.  Lymphadenopathy:    She has no cervical adenopathy.  Neurological: She is alert and oriented to person, place, and time.       No focal deficits  Skin: Skin is warm and dry. No rash noted.  Psychiatric:  FLAT AFFECT       Assessment:         Plan:

## 2011-05-09 ENCOUNTER — Ambulatory Visit (HOSPITAL_COMMUNITY)
Admission: RE | Admit: 2011-05-09 | Discharge: 2011-05-09 | Disposition: A | Discharge status: Home / Self Care | Payer: Medicare PPO | Source: Ambulatory Visit | Attending: Gastroenterology | Admitting: Gastroenterology

## 2011-05-09 ENCOUNTER — Other Ambulatory Visit: Payer: Self-pay | Admitting: Gastroenterology

## 2011-05-09 ENCOUNTER — Encounter: Payer: Medicare PPO | Admitting: Gastroenterology

## 2011-05-09 DIAGNOSIS — Z79899 Other long term (current) drug therapy: Secondary | ICD-10-CM | POA: Insufficient documentation

## 2011-05-09 DIAGNOSIS — K222 Esophageal obstruction: Secondary | ICD-10-CM | POA: Insufficient documentation

## 2011-05-09 DIAGNOSIS — I10 Essential (primary) hypertension: Secondary | ICD-10-CM | POA: Insufficient documentation

## 2011-05-09 DIAGNOSIS — K2960 Other gastritis without bleeding: Secondary | ICD-10-CM

## 2011-05-09 DIAGNOSIS — R112 Nausea with vomiting, unspecified: Secondary | ICD-10-CM

## 2011-05-09 DIAGNOSIS — E785 Hyperlipidemia, unspecified: Secondary | ICD-10-CM | POA: Insufficient documentation

## 2011-05-09 DIAGNOSIS — K2940 Chronic atrophic gastritis without bleeding: Secondary | ICD-10-CM | POA: Insufficient documentation

## 2011-05-09 DIAGNOSIS — K2980 Duodenitis without bleeding: Secondary | ICD-10-CM | POA: Insufficient documentation

## 2011-05-09 DIAGNOSIS — R110 Nausea: Secondary | ICD-10-CM | POA: Insufficient documentation

## 2011-05-10 NOTE — Op Note (Signed)
NAME:  Kelsey Nelson, Kelsey Nelson                ACCOUNT NO.:  [id number]   MEDICAL RECORD NO.:  [id number]          PATIENT TYPE:  OIB   LOCATION:  2899                         FACILITY:  [facility]   PHYSICIAN:  Alford Highland. Rankin, M.D.   DATE OF BIRTH:  08-03-1934   DATE OF PROCEDURE:  10/15/2004  DATE OF DISCHARGE:                                 OPERATIVE REPORT   PREOPERATIVE DIAGNOSIS:  Proliferative vitreal retinopathy, stage C2, both  eyes.   POSTOPERATIVE DIAGNOSES:  1.  Proliferative vitreal retinopathy, stage C2, both eyes.  2.  Anterior synechiae, corneal adhesion of iris.   OPERATION PERFORMED:  1.  Posterior vitrectomy and membrane peel, right eye.  PVR site      infratemporally near the ora serrata.  2.  Endolaser panphotocoagulation to reattach the retina.  3.  Internal drainage of subretinal fluid via gas-fluid exchange.  4.  Instillation of a permanent vitreous substitute--silicone oil 5000      centistokes.  5.  Removal of silicone oil, right eye, at the beginning of the surgery.  6.  Lysis of anterior synechiae of iris adhesion to the old corneal injury      site, right eye.   SURGEON:  Alford Highland. Rankin, M.D.   ANESTHESIA:  Local retrobulbar, monitored anesthesia control.   INDICATIONS FOR PROCEDURE:  The patient is a 77 year old woman who has a  history of profound vision loss in this right eye on the basis of old  traumatic injury never repaired, cataract formation, who underwent initially  successful removal of a mature involutional traumatic cataract which was  complicated by some subretinal hemorrhage which presumably has led to  development of proliferative diabetic retinopathy.  This is an attempt to  remove the oil, reattach the retina, remove membrane material in this left  eye epiretinal tissue that has produced this recurrent retinal detachment  and reinflation of the silicone oil.  The patient understands the risks of  anesthesia including the rare occurrence of  death but also to the eye  including but not limited to hemorrhage, infection, scarring, need for  another surgery, no change in vision, loss of vision and progression of  disease despite intervention.  After appropriate signed consent was  obtained, the patient was taken to the operating room.   DESCRIPTION OF PROCEDURE:  In the operating room, appropriate monitoring was  followed by mild sedation.  0.75% Marcaine was delivered 5 mL retrobulbar  followed by an additional 5 mL laterally in the fashion of modified Darel Hong.  The right periocular region was sterilely prepped and draped in the  usual ophthalmic fashion.  A lid speculum was applied.  Conjunctival  peritomy was fashioned in each of the quadrants with the exception of  infranasal.  A 4 mm infusion secured 3.5 mm posterior to the limbus in the  infratemporal quadrant.  Placement in the vitreous cavity verified visually.  Superior sclerotomies then fashioned.  Wild microscope was placed in  position with the Biom attached.  Core vitrectomy was then begun.  This  initial part of the procedure consisted of extraction  of silicone oil with a  silicone oil extractor.  Remnants of the oil were removed as well.  The  lysis of the anterior corneal adhesion was then carried out using  intravitreous scissors.   At this time forceps were then used to engage the epiretinal tissues in the  infratemporal quadrant near the ora serrata with significant traction on the  retina preventing its reattachment.  This was also the cause of the  detachment.  Significant attempts to remove the epiretinal tissues caused  significant traction posteriorly and the fear of causing a large posterior  break.  For this reason, it was decided to perform a limited retinectomy at  the site of the PVR, because of the intimate nature of the adherence of the  tissues and the underlying corrugation and constriction of the retina.  Endo  diathermy was then applied to  the vessels and then this area had relaxing  retinotomy fashioned.  This allowed for excellent mobility of the peripheral  retina.  At this time fluid-air exchange was then completed.  The retina  flattened nicely without difficulty.  Endolaser panphotocoagulation placed  in the bed of the detachment which extended from the 9 o'clock position  inferiorly around to the 5:30 position.  Posterior row of laser treatment  was placed between the macula and the posterior extent of the detachment.  The retina flattened nicely.  Because of its inferior nature of detachment,  decision was made to reinstill the silicone oil.  This was done under  passive technique.  Superior sclerotomies were then closed with 7-0 Vicryl.  The infusion removed and similarly closed with 7-0 Vicryl.  Conjunctiva  closed with 7-0 Vicryl.  Subconjunctival injection of antibiotic and steroid  applied.  The patient tolerated the procedure well without complication.       GAR/MEDQ  D:  10/15/2004  T:  10/15/2004  Job:  [id number]

## 2011-05-10 NOTE — Op Note (Signed)
NAME:  Kelsey Nelson, BROXTERMAN                          ACCOUNT NO.:  [id number]   MEDICAL RECORD NO.:  [id number]                   PATIENT TYPE:  OIB   LOCATION:  2550                                 FACILITY:  [facility]   PHYSICIAN:  Alford Highland. Rankin, M.D.                DATE OF BIRTH:  May 12, 1934   DATE OF PROCEDURE:  05/07/2004  DATE OF DISCHARGE:  05/07/2004                                 OPERATIVE REPORT   PREOPERATIVE DIAGNOSIS:  1. 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  2. Iris and corneal adhesions.   POSTOPERATIVE DIAGNOSIS:  1. 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  2. Iris and corneal adhesions.  3. Rhegmatogenous detachment temporally, supratemporally.   OPERATION PERFORMED:  1. Posterior vitrectomy with endolaser pan photocoagulation to repair     retinal detachment.  2. Removal of nonmagnetic foreign body, multiple fragments of old crenated     involutional cataract.  3. Temporary injection of vitreous substitute, Perfluoron.  4. Injection of permanent vitreous substitute, 500 centistokes.  5. Lysis of adhesions corneal adhesions, right eye.  6. Inferior peripheral iridectomy, right eye.   SURGEON:  Alford Highland. Rankin, M.D.   ANESTHESIA:  Local retrobulbar, monitored anesthesia control.   INDICATIONS FOR PROCEDURE:  The patient is a 77 year old woman with a 55  year history of traumatic cataract from perforating injury.  This is an  attempt to remove the cloudy __________ opacification.  The patient and the  family understand the risk of retinal detachment, hemorrhage, infection,  scarring, need for another surgery, no change in vision, loss of vision, and  uncovering previous injury.  She understands the risks to the eye, including  even loss of the eye.  She wished to proceed with a chance at maximizing  visual potential in this eye.  Appropriate signed consent was obtained.  She  was taken to the operating room.   DESCRIPTION OF PROCEDURE:  In the  operating room appropriate monitoring  followed by mild sedation.  0.75% Marcaine delivered 5 mL retrobulbar  followed by additional 5 mL laterally in the fashion of modified Darel Hong.  The right periocular region was sterilely prepped and draped in the usual  ophthalmic fashion.  A lid speculum was applied.  Conjunctival peritomy was  fashioned temporally and supranasally.  A 4 mm infusion secured 3.5 mm  posterior to the limbus in the infratemporal quadrant.  During its placement  it was noticed that free fluid and vitreous exited the wound.   At this time the anterior chamber was opened and deepened with Viscoat via  paracentesis incision.  Superior sclerotomies were then fashioned.  The  microscope was placed in position with the Biom attached.  Core vitrectomy  was then begun.  Instruments were seen immediately in the visual axis and  behind the large lens fragment and high aspiration, low cutting rate was  successful at engaging the central  remnants of the lens.  This material was  white and fairly easily removed.  It was necessary to cause segmentation in  the posterior segment using the MVR blade.  This allowed further engagement  and removal in a circumferential fashion of the Soemmering's ring type of  crenated material.  Thus it was necessary at this time as the vitrectomy  machine would not cut this, nor would the fragmentation machine aspirate  this leathery type material, found necessary to use the __________ foreign  body forceps.  This allowed segmentation and removal of this material.  Some  fragments were noted to be mobile.  There did appear to be a small whitening  of the retina inferior to the optic nerve which was suspicious for a lens  fragment injury.  Similar type injury occurred infratemporal to the arcade.  It was at this time that intraocular Perfluoron was used to protect and  cover the macular region.  No further injuries were noted.  After removal of  all the  multiple lens fragments carried out in deliberate fashion,  it was  noted that there was fluid in the retina temporally. This appeared to be in  the same quadrant of the previous corneal laceration. Suspicion for retinal  hole in this area.  Indirect ophthalmoscopy count not confirm the location  of the retinal hole.  It is believed that there may be a small atrophic hole  at the ora serrata.   For this reason, after removal of all the lens fragments had been confirmed,  the Perfluoron was left in place in the macular region to protect the retina  in the macular region.  Fluid-air exchange was carried out overlying this  area.  Diathermy was then carried out in this area and laser retinopexy.  The retina reattached nicely.  All subretinal fluid was removed.  Remnants  of the Perfluoron were removed.  Thereafter, a decision was made to use  intraocular silicone oil because of the highly complex nature of this repair  and the likelihood that longstanding intraocular tamponade be required.  Inferior peripheral iridectomy had been fashioned previously in the removal  of the Soemmering's ring type of cataract.  At this time, the supranasal  sclerotomy closed with 7-0 Vicryl.  The air-silicone oil exchange 5000  centistokes was then carried out without difficulty.  This sclerotomy was  then closed with 7-0 Vicryl.  The infusion removed and similarly closed with  7-0 Vicryl.  Conjunctiva closed with 7-0 Vicryl.  Subconjunctival injection  of antibiotic and steroid applied.  The patient tolerated the procedure well  without complication. The patient was awake and alert and transferred to the  short stay area to be discharged as an outpatient.                                               Alford Highland Rankin, M.D.    GAR/MEDQ  D:  05/07/2004  T:  05/08/2004  Job:  [id number]

## 2011-05-16 ENCOUNTER — Encounter: Payer: Self-pay | Admitting: Family Medicine

## 2011-05-17 ENCOUNTER — Ambulatory Visit (INDEPENDENT_AMBULATORY_CARE_PROVIDER_SITE_OTHER): Payer: Medicare PPO | Admitting: Family Medicine

## 2011-05-17 ENCOUNTER — Encounter: Payer: Self-pay | Admitting: Family Medicine

## 2011-05-17 VITALS — BP 130/78 | HR 83 | Resp 16 | Ht 66.0 in | Wt 143.0 lb

## 2011-05-17 DIAGNOSIS — N289 Disorder of kidney and ureter, unspecified: Secondary | ICD-10-CM | POA: Insufficient documentation

## 2011-05-17 DIAGNOSIS — R1013 Epigastric pain: Secondary | ICD-10-CM

## 2011-05-17 DIAGNOSIS — E785 Hyperlipidemia, unspecified: Secondary | ICD-10-CM

## 2011-05-17 DIAGNOSIS — I10 Essential (primary) hypertension: Secondary | ICD-10-CM

## 2011-05-17 DIAGNOSIS — K5900 Constipation, unspecified: Secondary | ICD-10-CM

## 2011-05-17 DIAGNOSIS — K3189 Other diseases of stomach and duodenum: Secondary | ICD-10-CM

## 2011-05-17 MED ORDER — RAMIPRIL 5 MG PO CAPS: 5.0000 mg | ORAL_CAPSULE | Freq: Every day | ORAL | Status: DC

## 2011-05-17 MED ORDER — ROSUVASTATIN CALCIUM 10 MG PO TABS: 10.0000 mg | ORAL_TABLET | Freq: Every day | ORAL | Status: DC

## 2011-05-17 NOTE — Progress Notes (Signed)
  Subjective:    Patient ID: Kelsey Nelson, female    DOB: October 06, 1934, 77 y.o.   MRN: [id number]  HPI Pt reports 1 month history  Constipation, saw GI had amitiza presribed, she is intolerant. Relief from daily mineral oil , and this week std herbal supplement post meals , states this also seems to help, but willing to reduce usage of both products , limiting to as needed. Chronic h/o constipation flares every 3 to 4 mths for years. She is aware of need for further scanning to evaluate a renal lesion., and this will be set up as soon as possible. She has a chronic h/o excessive belching , the possibility of gall bladder US is opened to ensure no gallstones, this will be ordered    Review of Systems See HPI Denies recent fever or chills. Denies sinus pressure, nasal congestion, ear pain or sore throat. Denies chest congestion, productive cough or wheezing. Denies chest pains, palpitations, paroxysmal nocturnal dyspnea, orthopnea and leg swelling Occasional abdominal pain, chronic constipation and excessive belching Denies depression, anxiety or insomnia. Denies skin break down or rash.         Objective:   Physical Exam Patient alert and oriented and in no Cardiopulmonary distress.  HEENT: No facial asymmetry, EOMI, no sinus tenderness, TM's clear, Oropharynx pink and moist.  Neck supple no adenopathy.  Chest: Clear to auscultation bilaterally.  CVS: S1, S2 no murmurs, no S3.  ABD: Soft non tender. Bowel sounds normal.  Ext: No edema  MS: Adequate ROM spine, shoulders, hips and knees.  [name]  Psych: Good eye contact, normal affect. Memory intact not anxious or depressed appearing.        Assessment & Plan:

## 2011-05-17 NOTE — Patient Instructions (Signed)
F/u in 3. 5 months.  You will be referred for an MRI to further look at your kidney and most likely will also be followed by urology about this.  I will look further into the need for [place] of your gallbladder as it relates to belching and get back to you.  pls manage the bouts ofconstipation as discussed.  Pls keep ENT appt.  I will check as to when aspirin can be resumed

## 2011-05-24 ENCOUNTER — Telehealth: Payer: Self-pay | Admitting: Family Medicine

## 2011-05-24 NOTE — Telephone Encounter (Signed)
pls advise pt I recommend an ultrasound of her gallbladder, and schedule,I am unable to enter the order, dx dyspepsia, belching and bloating, thank you

## 2011-05-24 NOTE — Assessment & Plan Note (Signed)
Deteriorated, chronic excessive belching, gBUS to be ordered

## 2011-05-24 NOTE — Assessment & Plan Note (Signed)
Intolerant of amitiza,using OTC supplement with some relief

## 2011-05-24 NOTE — Assessment & Plan Note (Signed)
Controlled, no change in medication  

## 2011-05-24 NOTE — Assessment & Plan Note (Signed)
MRI to be ordered to further diilneate this, pt aware

## 2011-05-27 ENCOUNTER — Other Ambulatory Visit: Payer: Self-pay | Admitting: Family Medicine

## 2011-05-27 ENCOUNTER — Ambulatory Visit (HOSPITAL_COMMUNITY)
Admission: RE | Admit: 2011-05-27 | Discharge: 2011-05-27 | Disposition: A | Discharge status: Home / Self Care | Payer: Medicare PPO | Source: Ambulatory Visit | Attending: Family Medicine | Admitting: Family Medicine

## 2011-05-27 DIAGNOSIS — N281 Cyst of kidney, acquired: Secondary | ICD-10-CM

## 2011-05-27 DIAGNOSIS — N289 Disorder of kidney and ureter, unspecified: Secondary | ICD-10-CM

## 2011-05-27 DIAGNOSIS — K7689 Other specified diseases of liver: Secondary | ICD-10-CM | POA: Insufficient documentation

## 2011-05-27 HISTORY — DX: Cyst of kidney, acquired: N28.1

## 2011-05-27 MED ORDER — GADOBENATE DIMEGLUMINE 529 MG/ML IV SOLN: 13.0000 mL | Freq: Once | INTRAVENOUS | Status: AC | PRN

## 2011-05-28 ENCOUNTER — Telehealth: Payer: Self-pay | Admitting: Family Medicine

## 2011-05-28 ENCOUNTER — Encounter: Payer: Self-pay | Admitting: Family Medicine

## 2011-05-29 NOTE — Telephone Encounter (Signed)
Yes we can explain to Dr Patty Sermons office this is a pt request to cHANGE her doc, since they will want to know why (they are not encouraging people to switch from one doc to the next)

## 2011-06-05 ENCOUNTER — Ambulatory Visit: Payer: Medicare PPO | Admitting: Gastroenterology

## 2011-06-11 ENCOUNTER — Telehealth: Payer: Self-pay | Admitting: Gastroenterology

## 2011-06-11 NOTE — Telephone Encounter (Signed)
I was aware that she was thinking of changing, so  Since this is her idea, sending him her info is the next bestr thing we can do, thanks for your help with her

## 2011-06-11 NOTE — Op Note (Signed)
NAME:  Kelsey Nelson, Kelsey Nelson                ACCOUNT NO.:  [id number]  MEDICAL RECORD NO.:  [id number]           PATIENT TYPE:  O  LOCATION:  DAYP                          FACILITY:  [facility]  PHYSICIAN:  Jonette Eva, M.D.     DATE OF BIRTH:  [birth year]  DATE OF PROCEDURE:  05/09/2011 DATE OF DISCHARGE:                              OPERATIVE REPORT   PRIMARY PROVIDER:  Milus Mallick. Lodema Hong, MD  PROCEDURE:  Esophagogastroduodenoscopy with cold forceps biopsy of the gastric mucosa.  INDICATION FOR EXAM:  Kelsey Nelson is a 77 year old female who presents with persistent nausea and takes aspirin.  She had an upper endoscopy in December 2012 which showed peptic ulcer disease.  He was also noted to have chronic duodenitis but no H. pylori.  She has been having difficulty with constipation.  FINDINGS: 1. Distal Schatzki ring.  Otherwise, no evidence of Barrett's, mass,     erosions, ulcerations, or strictures. 2. Linear erosions seen in the prepyloric area.  Biopsies obtained via     cold forceps to evaluate for H. pylori gastritis. 3. Patchy erythema in the duodenal bulb with linear erythema extending     through the junction of D1 and D2.  Normal second portion of the     duodenum.  Unable to appreciate duodenal diverticula.  DIAGNOSIS:  Nausea, most likely secondary to gastritis and duodenitis. The gastritis and duodenitis most likely secondary to nonsteroidal antiinflammatory drugs.  RECOMMENDATIONS: 1. She had increased the Prilosec to 20 mg twice daily 30 minutes     prior to meals for the next 3 months.  Then, she may go down to     once daily.  She should continue her high-fiber diet. 2. She is asked to hold calcium for 1 month. 3. She should drink 6-8 cups of water daily. 4. She should take Amitiza 24 mcg or 8 mcg at bedtime.  The patient     will use the dose that caused the least amount of nausea. 5. No aspirin or NSAIDs for 14 days. 6. She already has a followup appointment to see  me in 1 month.  MEDICATIONS: 1. Demerol 50 mg IV. 2. Versed 3 mg IV. 3. Phenergan 6.25 mg IV.  PROCEDURE TECHNIQUE:  Physical exam was performed.  Informed consent was obtained from the patient after explaining the benefits, risks, and alternatives to the procedure.  The patient was connected to the monitor and placed in the left lateral position.  Continuous oxygen was provided by nasal cannula and IV medicine administered through the indwelling cannula.  After administration of sedation, the patient's esophagus was entered and the scope was advanced under direct visualization to the second portion of the duodenum.  Scope was removed slowly by carefully examining the color, texture, anatomy, and integrity of the mucosa on the way out.  The patient was recovered in endoscopy and discharged home in satisfactory condition.  RADIOGRAPHIC STUDIES:  CT scan of the abdomen and pelvis performed on May 08, 2011, showed a complex cyst in the left kidney.  Extensive left- sided diverticulosis with rectosigmoid muscular hypertrophy.  The gastric wall thickening  secondary to gastritis.   PATH: GASTRITIS-OMP BID.  Jonette Eva, M.D.     SF/MEDQ  D:  05/09/2011  T:  05/09/2011  Job:  [id number]  cc:   Milus Mallick. Lodema Hong, M.D. Fax: [phone]  Electronically Signed by Jonette Eva M.D. on 06/11/2011 01:10:35 PM

## 2011-06-11 NOTE — Telephone Encounter (Signed)
Pt requesting change to new GI doc. Information faxed to Dr. Karilyn Cota. Noted in the computer.

## 2011-06-12 ENCOUNTER — Telehealth: Payer: Self-pay | Admitting: Family Medicine

## 2011-06-12 NOTE — Telephone Encounter (Signed)
Dr Patty Sermons office said that he would not see patient because she is established [facility] patient. Please advise.

## 2011-06-12 NOTE — Telephone Encounter (Signed)
pls let pt know she will need to see GI in [place], and set up appt there for her when she decides  Which doc she wants to see

## 2011-06-12 NOTE — Telephone Encounter (Signed)
Spoke with NUR ofc. Dr. Karilyn Cota will review records and decide if he will accept pt. If not pt may see [facility] or [facility] in [place].

## 2011-06-12 NOTE — Telephone Encounter (Signed)
noted 

## 2011-06-13 NOTE — Telephone Encounter (Signed)
Notes was copied out of computer by Dr. Cathie Beams office

## 2011-06-13 NOTE — Telephone Encounter (Signed)
NUR called yesterday afternoon to let us know that he would see patient.

## 2011-06-18 ENCOUNTER — Ambulatory Visit (INDEPENDENT_AMBULATORY_CARE_PROVIDER_SITE_OTHER): Payer: Medicare PPO | Admitting: Internal Medicine

## 2011-06-18 DIAGNOSIS — R110 Nausea: Secondary | ICD-10-CM

## 2011-06-18 DIAGNOSIS — K5900 Constipation, unspecified: Secondary | ICD-10-CM

## 2011-06-21 ENCOUNTER — Encounter: Payer: Self-pay | Admitting: Family Medicine

## 2011-06-21 ENCOUNTER — Ambulatory Visit (HOSPITAL_COMMUNITY)
Admission: RE | Admit: 2011-06-21 | Discharge: 2011-06-21 | Disposition: A | Discharge status: Home / Self Care | Payer: Medicare PPO | Source: Ambulatory Visit | Attending: Family Medicine | Admitting: Family Medicine

## 2011-06-21 ENCOUNTER — Ambulatory Visit (INDEPENDENT_AMBULATORY_CARE_PROVIDER_SITE_OTHER): Payer: Medicare PPO | Admitting: Family Medicine

## 2011-06-21 VITALS — BP 150/84 | Temp 97.5°F | Ht 66.0 in | Wt 142.4 lb

## 2011-06-21 DIAGNOSIS — K5900 Constipation, unspecified: Secondary | ICD-10-CM | POA: Insufficient documentation

## 2011-06-21 DIAGNOSIS — R110 Nausea: Secondary | ICD-10-CM

## 2011-06-21 DIAGNOSIS — R109 Unspecified abdominal pain: Secondary | ICD-10-CM

## 2011-06-21 DIAGNOSIS — K5909 Other constipation: Secondary | ICD-10-CM | POA: Insufficient documentation

## 2011-06-21 DIAGNOSIS — I10 Essential (primary) hypertension: Secondary | ICD-10-CM

## 2011-06-21 DIAGNOSIS — R194 Change in bowel habit: Secondary | ICD-10-CM | POA: Insufficient documentation

## 2011-06-21 MED ORDER — ONDANSETRON HCL 2 MG/ML IJ SOLN
4.0000 mg | Freq: Once | INTRAMUSCULAR | Status: AC
  Administered 2011-06-21: 4 mg via INTRAMUSCULAR

## 2011-06-21 NOTE — Patient Instructions (Signed)
F/u as before.  You should have a good bowel movement in the next few hours.  On exam you do have soft stool in your rectum.  pls ensure that you eat a lot of vegetables and fruit , and keep your water intake good.(at least 64 ounces waterdaily)  Pls get an x ray because you have abdominal pain

## 2011-06-22 DIAGNOSIS — R110 Nausea: Secondary | ICD-10-CM | POA: Insufficient documentation

## 2011-06-22 NOTE — Assessment & Plan Note (Signed)
Unchanged, pt re-educated about diet and use of stool softeners and fiber daily, laxatives evry 3 days only preferred. Reassured of soft stool in rectum, anticipate bM soon

## 2011-06-22 NOTE — Assessment & Plan Note (Signed)
Elevated at this visit , likely due to strress

## 2011-06-22 NOTE — Progress Notes (Signed)
  Subjective:    Patient ID: Kelsey Nelson, female    DOB: May 01, 1934, 77 y.o.   MRN: [id number]  HPI Pt walked in today, stating 'I have never felt so terrible in all my life" No bM for past 3 days, despite laxative use , and constant nausea. Not much apetitie , poor intake , no vomiting. No fever or chills. Denies urinary symptoms.   Review of Systems Denies recent fever or chills. Denies sinus pressure, nasal congestion, ear pain or sore throat. Denies chest congestion, productive cough or wheezing. Denies chest pains,  and leg swelling       Objective:   Physical Exam Patient alert and oriented and in no Cardiopulmonary distress.  HEENT: No facial asymmetry, EOMI, , Oropharynx pink and moist.  Neck supple no adenopathy.  Chest: Clear to auscultation bilaterally.  CVS: S1, S2 no murmurs, no S3.  ABD: Soft . Left lower quadrant tenderness, questionable  rebound or guarding Rectal; no mas, soft guaic negative brown stool in rectum Ext: No edema  MS: Adequate ROM spine, shoulders, hips and knees.        Assessment & Plan:

## 2011-06-22 NOTE — Assessment & Plan Note (Signed)
Deteriorated, continues daily. Zofran administered. Will specifically order gall bladder studies

## 2011-06-27 ENCOUNTER — Ambulatory Visit (HOSPITAL_COMMUNITY)
Admission: RE | Admit: 2011-06-27 | Discharge: 2011-06-27 | Disposition: A | Discharge status: Home / Self Care | Payer: Medicare FFS | Source: Ambulatory Visit | Attending: Family Medicine | Admitting: Family Medicine

## 2011-06-27 ENCOUNTER — Other Ambulatory Visit: Payer: Self-pay | Admitting: Family Medicine

## 2011-06-27 DIAGNOSIS — R109 Unspecified abdominal pain: Secondary | ICD-10-CM | POA: Insufficient documentation

## 2011-06-27 DIAGNOSIS — Q619 Cystic kidney disease, unspecified: Secondary | ICD-10-CM | POA: Insufficient documentation

## 2011-06-27 DIAGNOSIS — R110 Nausea: Secondary | ICD-10-CM | POA: Insufficient documentation

## 2011-06-27 MED ORDER — PROMETHAZINE HCL 12.5 MG PO TABS: 12.5000 mg | ORAL_TABLET | Freq: Three times a day (TID) | ORAL | Status: DC | PRN

## 2011-06-28 ENCOUNTER — Other Ambulatory Visit: Payer: Self-pay | Admitting: Family Medicine

## 2011-06-28 ENCOUNTER — Ambulatory Visit (INDEPENDENT_AMBULATORY_CARE_PROVIDER_SITE_OTHER): Payer: Medicare FFS | Admitting: Urology

## 2011-06-28 DIAGNOSIS — R110 Nausea: Secondary | ICD-10-CM

## 2011-06-28 DIAGNOSIS — N281 Cyst of kidney, acquired: Secondary | ICD-10-CM

## 2011-07-01 ENCOUNTER — Other Ambulatory Visit (INDEPENDENT_AMBULATORY_CARE_PROVIDER_SITE_OTHER): Payer: Self-pay | Admitting: Internal Medicine

## 2011-07-01 DIAGNOSIS — R110 Nausea: Secondary | ICD-10-CM

## 2011-07-03 ENCOUNTER — Ambulatory Visit (HOSPITAL_COMMUNITY)
Admission: RE | Admit: 2011-07-03 | Discharge: 2011-07-03 | Disposition: A | Discharge status: Home / Self Care | Payer: Medicare FFS | Source: Ambulatory Visit | Attending: Internal Medicine | Admitting: Internal Medicine

## 2011-07-03 ENCOUNTER — Other Ambulatory Visit (INDEPENDENT_AMBULATORY_CARE_PROVIDER_SITE_OTHER): Payer: Self-pay | Admitting: Internal Medicine

## 2011-07-03 ENCOUNTER — Other Ambulatory Visit (HOSPITAL_COMMUNITY): Payer: Medicare PPO

## 2011-07-03 DIAGNOSIS — R110 Nausea: Secondary | ICD-10-CM

## 2011-07-03 DIAGNOSIS — R42 Dizziness and giddiness: Secondary | ICD-10-CM | POA: Insufficient documentation

## 2011-07-08 ENCOUNTER — Encounter (HOSPITAL_COMMUNITY): Payer: Self-pay

## 2011-07-08 ENCOUNTER — Encounter (HOSPITAL_COMMUNITY)
Admission: RE | Admit: 2011-07-08 | Discharge: 2011-07-08 | Disposition: A | Discharge status: Home / Self Care | Payer: Medicare FFS | Source: Ambulatory Visit | Attending: Family Medicine | Admitting: Family Medicine

## 2011-07-08 DIAGNOSIS — R110 Nausea: Secondary | ICD-10-CM | POA: Insufficient documentation

## 2011-07-08 MED ORDER — TECHNETIUM TC 99M MEBROFENIN IV KIT
5.0000 | PACK | Freq: Once | INTRAVENOUS | Status: AC | PRN
  Administered 2011-07-08: 5.4 via INTRAVENOUS

## 2011-07-08 MED ORDER — SINCALIDE 5 MCG IJ SOLR
0.0200 ug/kg | Freq: Once | INTRAMUSCULAR | Status: AC
  Administered 2011-07-08: 1.27 ug via INTRAVENOUS

## 2011-07-10 ENCOUNTER — Telehealth: Payer: Self-pay | Admitting: Family Medicine

## 2011-07-10 NOTE — Telephone Encounter (Signed)
Patient aware of result of gallbladder study

## 2011-07-12 NOTE — Progress Notes (Signed)
Quick Note:  Called patient left message ______

## 2011-07-15 ENCOUNTER — Encounter (INDEPENDENT_AMBULATORY_CARE_PROVIDER_SITE_OTHER): Payer: Self-pay | Admitting: *Deleted

## 2011-07-16 ENCOUNTER — Ambulatory Visit: Payer: Medicare PPO | Admitting: Family Medicine

## 2011-07-18 ENCOUNTER — Encounter (INDEPENDENT_AMBULATORY_CARE_PROVIDER_SITE_OTHER): Payer: Self-pay

## 2011-08-12 ENCOUNTER — Ambulatory Visit (INDEPENDENT_AMBULATORY_CARE_PROVIDER_SITE_OTHER): Payer: Medicare FFS | Admitting: Internal Medicine

## 2011-08-22 ENCOUNTER — Other Ambulatory Visit: Payer: Self-pay | Admitting: *Deleted

## 2011-08-22 MED ORDER — RAMIPRIL 5 MG PO CAPS: 5.0000 mg | ORAL_CAPSULE | Freq: Every day | ORAL | Status: DC

## 2011-08-27 ENCOUNTER — Ambulatory Visit: Payer: Medicare PPO | Admitting: Family Medicine

## 2011-08-29 ENCOUNTER — Other Ambulatory Visit: Payer: Self-pay | Admitting: Family Medicine

## 2011-08-29 DIAGNOSIS — Z139 Encounter for screening, unspecified: Secondary | ICD-10-CM

## 2011-09-02 ENCOUNTER — Ambulatory Visit: Payer: Medicare PPO | Admitting: Family Medicine

## 2011-09-03 ENCOUNTER — Ambulatory Visit (HOSPITAL_COMMUNITY)
Admission: RE | Admit: 2011-09-03 | Discharge: 2011-09-03 | Disposition: A | Discharge status: Home / Self Care | Payer: Medicare FFS | Source: Ambulatory Visit | Attending: Family Medicine | Admitting: Family Medicine

## 2011-09-03 DIAGNOSIS — Z1231 Encounter for screening mammogram for malignant neoplasm of breast: Secondary | ICD-10-CM | POA: Insufficient documentation

## 2011-09-03 DIAGNOSIS — Z139 Encounter for screening, unspecified: Secondary | ICD-10-CM

## 2011-09-10 ENCOUNTER — Ambulatory Visit (INDEPENDENT_AMBULATORY_CARE_PROVIDER_SITE_OTHER): Payer: Medicare FFS | Admitting: Internal Medicine

## 2011-09-10 DIAGNOSIS — E785 Hyperlipidemia, unspecified: Secondary | ICD-10-CM

## 2011-09-10 DIAGNOSIS — K5900 Constipation, unspecified: Secondary | ICD-10-CM

## 2011-09-10 DIAGNOSIS — K5909 Other constipation: Secondary | ICD-10-CM

## 2011-09-10 DIAGNOSIS — R110 Nausea: Secondary | ICD-10-CM

## 2011-09-10 MED ORDER — SUCRALFATE 1 G PO TABS: 2.0000 g | ORAL_TABLET | Freq: Every day | ORAL | Status: DC

## 2011-09-10 NOTE — Patient Instructions (Signed)
Go back on Crestor but take 5 mg daily. Carafate 2 gm by mouth daily at bedtime

## 2011-09-10 NOTE — Progress Notes (Signed)
Presenting complaint; followup for nausea and constipation. Subjective; mother 77 year old Caucasian female who is here for scheduled visit. I initially saw on 06/19/2011 for nausea and constipation. She had undergone multiple studies by Dr. Darrick Penna summarized in my initial note. She was begun on Zofran and has to take it every day She had MRI of her brain and no abnormality was found to account for her nausea. She states her constipation is improved a great deal. She is having one bowel movement every day stool is soft. Her nausea has not gone away completely but is not as intense. She has nausea every morning when she wakes up in as soon as she needs her breakfast she gets better. He would go she does not have good appetite she has to eat small meals so that she can keep her nausea weight. She has not experienced any vomiting. She also complains of insomnia and using Sleep-Aid which helps sometimes. She believes she is coping better with her nausea. Current medications; Current Outpatient Prescriptions on File Prior to Visit  Medication Sig Dispense Refill  . amoxicillin (AMOXIL) 500 MG capsule       . Calcium Carb-Cholecalciferol (CALCIUM 1000 + D PO) Take by mouth.        . Digestive Enzymes TABS Take 1 tablet by mouth 3 (three) times daily.        . Flaxseed, Linseed, (FLAX SEEDS PO) Take by mouth.        . lubiprostone (AMITIZA) 24 MCG capsule 1 po BID  60 capsule  11  . magnesium gluconate (MAGONATE) 500 MG tablet Take 500 mg by mouth 2 (two) times daily.        . Multiple Vitamin (MULTIVITAMIN) capsule Take 1 capsule by mouth daily.        [name] omeprazole (PRILOSEC) 20 MG capsule Take 1 capsule (20 mg total) by mouth daily.  90 capsule  3  . ramipril (ALTACE) 5 MG capsule Take 1 capsule (5 mg total) by mouth daily.  90 capsule  1  . rosuvastatin (CRESTOR) 10 MG tablet Take 1 tablet (10 mg total) by mouth daily.  90 tablet  1   objective; Weight 140 pounds, she is 65 inches tall. Pulse 68 per  minute. Blood pressure 108/70. Respirations 16 and temp is 97.8. Conjunctiva is pink sclerae nonicteric  oropharyngeal mucosa is normal. Neck masses or thyromegaly noted. Abdomen is soft and nontender without organomegaly or masses No LE edema noted Lab data; MRI of brain without contrast on 07/03/2011 Normal examination Assessment; #1. Constipation; she is doing much better with therapy which will be continued for now #2. Chronic nausea with negative GI workup; he seemed to help this symptom; ? Bile reflux. Plan; Trial with Carafate 2 g at bedtime. Patient will call us with a progress report in few weeks. She will return for office visit in 3 months. Management of her insomnia will be deferred to Dr. Lodema Hong

## 2011-09-11 ENCOUNTER — Encounter (INDEPENDENT_AMBULATORY_CARE_PROVIDER_SITE_OTHER): Payer: Self-pay | Admitting: Internal Medicine

## 2011-09-20 ENCOUNTER — Encounter: Payer: Self-pay | Admitting: Family Medicine

## 2011-09-20 ENCOUNTER — Ambulatory Visit (INDEPENDENT_AMBULATORY_CARE_PROVIDER_SITE_OTHER): Payer: Medicare FFS | Admitting: Family Medicine

## 2011-09-20 VITALS — BP 110/70 | HR 75 | Resp 16 | Ht 66.0 in | Wt 139.0 lb

## 2011-09-20 DIAGNOSIS — R110 Nausea: Secondary | ICD-10-CM

## 2011-09-20 DIAGNOSIS — E785 Hyperlipidemia, unspecified: Secondary | ICD-10-CM

## 2011-09-20 DIAGNOSIS — R05 Cough: Secondary | ICD-10-CM

## 2011-09-20 DIAGNOSIS — G4700 Insomnia, unspecified: Secondary | ICD-10-CM | POA: Insufficient documentation

## 2011-09-20 DIAGNOSIS — I10 Essential (primary) hypertension: Secondary | ICD-10-CM

## 2011-09-20 MED ORDER — METHYLPREDNISOLONE ACETATE 80 MG/ML IJ SUSP
80.0000 mg | Freq: Once | INTRAMUSCULAR | Status: AC
  Administered 2011-09-20: 80 mg via INTRAMUSCULAR

## 2011-09-20 MED ORDER — CHLORPHENIRAMINE-HYDROCODONE 8-10 MG/5ML PO LQCR: 5.0000 mL | Freq: Two times a day (BID) | ORAL | Status: DC | PRN

## 2011-09-20 MED ORDER — AZITHROMYCIN 250 MG PO TABS: ORAL_TABLET | ORAL | Status: AC

## 2011-09-20 MED ORDER — PREDNISONE (PAK) 5 MG PO TABS: 5.0000 mg | ORAL_TABLET | ORAL | Status: AC

## 2011-09-20 MED ORDER — HYDROCOD POLST-CHLORPHEN POLST 10-8 MG/5ML PO LQCR: 5.0000 mL | Freq: Two times a day (BID) | ORAL | Status: DC | PRN

## 2011-09-20 MED ORDER — TEMAZEPAM 15 MG PO CAPS: 15.0000 mg | ORAL_CAPSULE | Freq: Every evening | ORAL | Status: AC | PRN

## 2011-09-20 NOTE — Assessment & Plan Note (Signed)
Hyperlipidemia:Low fat diet discussed and encouraged.  Pt has discontinued crestor for the past approx 4 weeks at the suggestion of the GI doc when she c/ cramps, which have resolved off of the drug rept labs in 3 months

## 2011-09-20 NOTE — Patient Instructions (Addendum)
F/U in December , middle.  You will get depo medrol injection, and a prednisone dose pack and z pack.  You are being treated for allergic based cough with signs of early infection and also new med for insomnia.  Pls do not take cough suppressant syrup and sleep aid together  LABWORK  NEEDS TO BE DONE BETWEEN 3 TO 7 DAYS BEFORE YOUR NEXT SCEDULED  VISIT.  THIS WILL IMPROVE THE QUALITY OF YOUR CARE. Fasting lipid, hepatic and chem 7 in December before visit

## 2011-09-20 NOTE — Assessment & Plan Note (Signed)
Sleep hygiene discussed. Pt  essentially has good habits. She is now started on restoril also

## 2011-09-20 NOTE — Progress Notes (Signed)
  Subjective:    Patient ID: Kelsey Nelson, female    DOB: 11-13-1934, 77 y.o.   MRN: [id number]  HPI 3 day h/o dry cough , keeping her up at night, no fever or chills, no sinus pressure, sore throat  Or ear pain.States she does have occasional cream , yellow sputum Pt reports that her nausea though present has improved, but states"since the nausea all started" she has been having difficulty sleeping, both falling asleep and staying asleep.Sleeps on avg 4 hours and wants relief . Denies depression or anxiety, hopes to go out on a road trip in the next few days.   Review of Systems See HPI Denies chest pains, palpitations and leg swelling Denies abdominal pain, nausea, vomiting,diarrhea or constipation.   Denies dysuria, frequency, hesitancy or incontinence. Denies joint pain, swelling and limitation in mobility. Denies headaches, seizures, numbness, or tingling.  Denies skin break down or rash.        Objective:   Physical Exam  Patient alert and oriented and in no cardiopulmonary distress.  HEENT: No facial asymmetry, EOMI, no sinus tenderness,  oropharynx pink and moist.  Neck supple no adenopathy.  Chest:adequate air entry throughout few crackles , no wheezes  CVS: S1, S2 no murmurs, no S3.  ABD: Soft non tender. Bowel sounds normal.  Ext: No edema  MS: Adequate ROM spine, shoulders, hips and knees.  Skin: Intact, no ulcerations or rash noted.  Psych: Good eye contact, normal affect. Memory intact not anxious or depressed appearing.  CNS: CN 3-12 intact, power, tone and sensation normal throughout.Loss of vision, unilateral       Assessment & Plan:

## 2011-09-20 NOTE — Assessment & Plan Note (Signed)
Acute flare, depo medrol administered in office , prednisone dose pack and a z pack also prescribed

## 2011-09-20 NOTE — Assessment & Plan Note (Signed)
Reports improvement , though still present

## 2011-09-20 NOTE — Assessment & Plan Note (Signed)
Controlled, no change in medication  

## 2011-10-28 ENCOUNTER — Ambulatory Visit: Payer: Medicare FFS

## 2011-10-29 ENCOUNTER — Telehealth: Payer: Self-pay | Admitting: Family Medicine

## 2011-10-30 NOTE — Telephone Encounter (Signed)
Message left for [pt to call back, if she does pls get me to the phone

## 2011-10-31 ENCOUNTER — Other Ambulatory Visit: Payer: Self-pay | Admitting: Family Medicine

## 2011-10-31 MED ORDER — CYCLOBENZAPRINE HCL 5 MG PO TABS: 5.0000 mg | ORAL_TABLET | Freq: Every day | ORAL | Status: AC

## 2011-10-31 NOTE — Telephone Encounter (Signed)
Pt called in stating that the nausea is a little better, but still there," something is wrong"states she does not want to eat because of this , but when she eats she does feel better however. Not losing weight to her knowledge, med prescribed has not worked, wants 2nd opinion.I advised I will flag Dr. Karilyn Cota for his input/assistance and one or the other of [place] will be back in touch Also leg cramps ,at night, like spasms,, had stopped crestor to see if this helped , no change. She is reminded to have lab work done and keep f/u appt in the office

## 2011-11-01 NOTE — Telephone Encounter (Signed)
Flexeril 5mg  at bedtime sent in for leg cramps, pt aware

## 2011-11-04 ENCOUNTER — Encounter (INDEPENDENT_AMBULATORY_CARE_PROVIDER_SITE_OTHER): Payer: Self-pay | Admitting: Internal Medicine

## 2011-11-04 NOTE — Telephone Encounter (Signed)
Pt never called back.

## 2011-11-06 ENCOUNTER — Telehealth (INDEPENDENT_AMBULATORY_CARE_PROVIDER_SITE_OTHER): Payer: Self-pay | Admitting: Internal Medicine

## 2011-11-06 NOTE — Telephone Encounter (Signed)
Dr. Karilyn Cota states that he was going to refer her to a Gastroenterologist at [facility], if the patient has a name of one there that she would like to see , she can provide that name for [place] and we will request that one. I called the patient's home number and left her a message with those recommendations.

## 2011-11-06 NOTE — Telephone Encounter (Signed)
Kelsey Nelson was to call and let Dr. Karilyn Cota know who she would like to be referred to. [facility] is the easiest for her to get to. She would like to see who Dr. Karilyn Cota would suggest.

## 2011-11-06 NOTE — Telephone Encounter (Signed)
Reviewed message. I will ask Dr. Karilyn Cota who he would refer patient to and then call her back.

## 2011-11-27 ENCOUNTER — Encounter: Payer: Self-pay | Admitting: Family Medicine

## 2011-11-29 ENCOUNTER — Other Ambulatory Visit: Payer: Self-pay | Admitting: Family Medicine

## 2011-11-29 LAB — HEPATIC FUNCTION PANEL
Alkaline Phosphatase: 110 U/L (ref 39–117)
Bilirubin, Direct: 0.1 mg/dL (ref 0.0–0.3)
Indirect Bilirubin: 0.4 mg/dL (ref 0.0–0.9)
Total Protein: 6.8 g/dL (ref 6.0–8.3)

## 2011-11-29 LAB — BASIC METABOLIC PANEL
BUN: 21 mg/dL (ref 6–23)
Chloride: 102 mEq/L (ref 96–112)
Creat: 0.96 mg/dL (ref 0.50–1.10)
Glucose, Bld: 91 mg/dL (ref 70–99)
Potassium: 4.8 mEq/L (ref 3.5–5.3)

## 2011-11-29 LAB — LIPID PANEL
Cholesterol: 195 mg/dL (ref 0–200)
LDL Cholesterol: 87 mg/dL (ref 0–99)
Triglycerides: 96 mg/dL (ref <150)
VLDL: 19 mg/dL (ref 0–40)

## 2011-12-02 ENCOUNTER — Ambulatory Visit (INDEPENDENT_AMBULATORY_CARE_PROVIDER_SITE_OTHER): Payer: Medicare FFS | Admitting: Family Medicine

## 2011-12-02 ENCOUNTER — Encounter: Payer: Self-pay | Admitting: Family Medicine

## 2011-12-02 VITALS — BP 110/80 | HR 75 | Resp 16 | Ht 66.0 in | Wt 138.4 lb

## 2011-12-02 DIAGNOSIS — R351 Nocturia: Secondary | ICD-10-CM

## 2011-12-02 DIAGNOSIS — I10 Essential (primary) hypertension: Secondary | ICD-10-CM

## 2011-12-02 DIAGNOSIS — G4700 Insomnia, unspecified: Secondary | ICD-10-CM

## 2011-12-02 DIAGNOSIS — L989 Disorder of the skin and subcutaneous tissue, unspecified: Secondary | ICD-10-CM | POA: Insufficient documentation

## 2011-12-02 DIAGNOSIS — R5383 Other fatigue: Secondary | ICD-10-CM

## 2011-12-02 DIAGNOSIS — R110 Nausea: Secondary | ICD-10-CM

## 2011-12-02 DIAGNOSIS — E785 Hyperlipidemia, unspecified: Secondary | ICD-10-CM

## 2011-12-02 MED ORDER — IMIPRAMINE HCL 10 MG PO TABS: 10.0000 mg | ORAL_TABLET | Freq: Every day | ORAL | Status: DC

## 2011-12-02 NOTE — Patient Instructions (Signed)
F/u in 4 months.  New medication to help with sleep and excessive wetting at night, pls call if no improvement in January.  I will send a msg to Dr Karilyn Cota about the concern with the fact that you have a sister whop had pancreatic cancer, ansd how soon the [facility] of your abdomen should be repeated, and will get back to you.  You are being referred to dermatology about your skin, esp the itchy lesion on your low back  TSH is being added to your most recent labs which are excellent.  Your weight is essentially unchanged

## 2011-12-02 NOTE — Progress Notes (Signed)
  Subjective:    Patient ID: Kelsey Nelson, female    DOB: 1934-07-03, 77 y.o.   MRN: [id number]  HPI Pt in for f/u of her chronic condiitons. She had sent in a letter expressing her concerns prior to actually getting in today. Essentially states the nausea persists but has improved slightly. States lack of sleep is her worst problem and urinary frequency at night is a major contributor. She has been evaluated by urology who felt as though the poor sleep was more of a problem than urinary symptoms. She denies burning, on urination At this time does not feel really pressured to see GI outside of the area with regard to the nausea, and when a choice was given of phenergan dm which would help with nausea and sleep , or imipramine , she opted to start with imipramine which I totally agree with. She also had concerns about flat brown spots on her forearm and arm , not pruritic,no drainage, increasing in number over time   Review of Systems See HPI Denies recent fever or chills. Denies sinus pressure, nasal congestion, ear pain or sore throat. Denies chest congestion, productive cough or wheezing. Denies chest pains, palpitations and leg swelling Denies abdominal pain, diarrhea or constipation.   Denies dysuria, or incontinence. Denies joint pain, swelling and limitation in mobility. Denies headaches, seizures, numbness, or tingling. Denies depression or  anxiety.         Objective:   Physical Exam  Patient alert and oriented and in no cardiopulmonary distress.  HEENT: No facial asymmetry, EOMI, no sinus tenderness,  oropharynx pink and moist.  Neck supple no adenopathy.  Chest: Clear to auscultation bilaterally.  CVS: S1, S2 no murmurs, no S3.  ABD: Soft non tender. Bowel sounds normal.  Ext: No edema  MS: Adequate ROM spine, shoulders, hips and knees.  Skin: Intact, multiple hyperpigmented lesions on upper ext of concern is darker lesion with irregular border on low back which  is pruritic  Psych: Good eye contact, normal affect. Memory intact not anxious or depressed appearing.  CNS: CN 2-12 intact, power, tone and sensation normal throughout.       Assessment & Plan:

## 2011-12-04 ENCOUNTER — Encounter (INDEPENDENT_AMBULATORY_CARE_PROVIDER_SITE_OTHER): Payer: Self-pay | Admitting: *Deleted

## 2011-12-09 NOTE — Assessment & Plan Note (Signed)
Controlled, no change in medication  

## 2011-12-09 NOTE — Assessment & Plan Note (Signed)
Trial of imipramine for symptom control and to assist with poorsleep

## 2011-12-09 NOTE — Assessment & Plan Note (Signed)
Hyperpigmented, multicolored lesion which is pruritic on low back is of greatest concern referred to dermatology

## 2011-12-09 NOTE — Assessment & Plan Note (Signed)
Slightly improved, but still present , no med prescribed for symptom at this time, weight stable

## 2011-12-26 ENCOUNTER — Telehealth: Payer: Self-pay | Admitting: Family Medicine

## 2011-12-27 NOTE — Telephone Encounter (Signed)
pls call dr Karilyn Cota I am returning call on Kelsey Nelson, pls get me to the phone, I am available this mornning

## 2011-12-27 NOTE — Telephone Encounter (Signed)
Dr. Cathie Beams office was called

## 2012-01-07 ENCOUNTER — Ambulatory Visit (INDEPENDENT_AMBULATORY_CARE_PROVIDER_SITE_OTHER): Payer: MEDICARE | Admitting: Internal Medicine

## 2012-01-07 ENCOUNTER — Encounter (INDEPENDENT_AMBULATORY_CARE_PROVIDER_SITE_OTHER): Payer: Self-pay | Admitting: Internal Medicine

## 2012-01-07 DIAGNOSIS — R110 Nausea: Secondary | ICD-10-CM

## 2012-01-07 DIAGNOSIS — K5900 Constipation, unspecified: Secondary | ICD-10-CM

## 2012-01-07 DIAGNOSIS — R351 Nocturia: Secondary | ICD-10-CM

## 2012-01-07 DIAGNOSIS — G4700 Insomnia, unspecified: Secondary | ICD-10-CM

## 2012-01-07 MED ORDER — DOCUSATE SODIUM 100 MG PO TABS: 200.0000 mg | ORAL_TABLET | Freq: Two times a day (BID) | ORAL | Status: AC

## 2012-01-07 MED ORDER — IMIPRAMINE HCL 10 MG PO TABS: 20.0000 mg | ORAL_TABLET | Freq: Every day | ORAL | Status: DC

## 2012-01-07 NOTE — Patient Instructions (Signed)
Can decrease Imipramine dose to 10 mg if you have side-effects with 20 mg daily. Notify if symptoms worsen.

## 2012-01-07 NOTE — Progress Notes (Signed)
Presenting complaint; Followup for nausea and constipation. Subjective: Kelsey Nelson is a 78 year old Caucasian female patient of Dr. Claris Che Simpson's who is here for scheduled visit. She has been struggling with nausea and constipation for several months. She's had extensive workup which has been negative. She had normal B12 level last month. She saw Dr. Lodema Hong few weeks ago and was begun on imipramine of nocturia and difficulty sleeping. Both of these symptoms have improved. Now she is having to get up every 2 hours to urinate while previously was every hour. She remains with nausea which is worse late in the day and seemed to improve with meals. She has not experienced  emesis. Occasionally she has nausea free day. She states her nausea is not as bad as it was a few months ago. Her appetite is fair and her weight has been stable since her last visit 4 months ago. She is still having some difficulty with constipation and takes Peri-Colace 3 times a week. She denies melena or rectal bleeding. She is having less night sweats since she has been on imipramine. Current Medications: Current Outpatient Prescriptions  Medication Sig Dispense Refill  . aspirin 81 MG tablet Take 81 mg by mouth daily.       . Calcium Carbonate-Vitamin D (CALCIUM-VITAMIN D3) 600-200 MG-UNIT TABS Take by mouth 1 day or 1 dose.       . Cholecalciferol (VITAMIN D PO) Take 1,000 Units by mouth.      . Digestive Enzymes TABS Take 1 tablet by mouth daily.       Tery Sanfilippo Calcium (STOOL SOFTENER PO) Take by mouth. Patient takes 1 by mouth every night       . Doxylamine Succinate, Sleep, (SLEEP AID PO) Take 1 tablet by mouth at bedtime as needed.        . fish oil-omega-3 fatty acids 1000 MG capsule Take 2 g by mouth daily.      . magnesium gluconate (MAGONATE) 500 MG tablet Take 500 mg by mouth 2 (two) times daily.        . Probiotic Product (ALIGN) 4 MG CAPS Take by mouth daily.        . ramipril (ALTACE) 5 MG capsule Take 1 capsule  (5 mg total) by mouth daily.  90 capsule  1  . rosuvastatin (CRESTOR) 10 MG tablet Take 10 mg by mouth daily.        [name] imipramine (TOFRANIL) 10 MG tablet Take 1 tablet (10 mg total) by mouth at bedtime.  30 tablet  11    Objective: Blood pressure 120/74, pulse 76, temperature 98.1 F (36.7 C), temperature source Oral, resp. rate 14, height 5\' 5"  (1.651 m), weight 140 lb (63.504 kg). Conjunctiva is pink. Sclera is nonicteric Oral pharyngeal mucosa is normal. No neck masses or thyromegaly noted. Cardiac exam with regular rhythm normal S1 and S2. No murmur or gallop noted. Lungs are clear to auscultation. Abdomen is soft and nontender without organomegaly or masses. No LE edema or clubbing noted.   Assessment: #1 Nausea. Extensive workup looking for GI as well as non-GI causes has been negative. Her nausea appears to be central in origin. Imipramine at higher dose might help if she is able to tolerate it. She does not appear to be depressed. It is reassuring to know that her nausea is not as intractable as it was previously. #2. Chronic constipation.    Plan: Increase Colace to 2 tablets daily at bedtime and use Peri-Colace only on an as-needed basis.  Increase imipramine to 20 mg by mouth daily. Notify if you have side effects. Call with progress report in 4 weeks Office visit in 6 months

## 2012-01-26 ENCOUNTER — Other Ambulatory Visit: Payer: Self-pay | Admitting: Family Medicine

## 2012-02-03 ENCOUNTER — Telehealth (INDEPENDENT_AMBULATORY_CARE_PROVIDER_SITE_OTHER): Payer: Self-pay | Admitting: *Deleted

## 2012-02-03 NOTE — Telephone Encounter (Signed)
Kelsey Nelson came by the office and would like Dr. Karilyn Cota to know she is ready to be sent somewhere for observation. Her return phone number is [phone].

## 2012-02-04 NOTE — Telephone Encounter (Signed)
Patient called and LM stating she doesn't know anything about either place. [facility] is closer but would like to go to the place of Dr. Patty Sermons choice.

## 2012-02-04 NOTE — Telephone Encounter (Signed)
I called patient yesterday and left a message for her to tell us if she would prefer to go to the [facility], [facility] or [address]. Ann, please arrange a visit for Ms. Knippel to tertiary center of her choice; please send to my initial consultation note as well as to followup notes, blood work, MRI result. Reason for consultation is unexplained nausea.

## 2012-02-05 NOTE — Telephone Encounter (Signed)
Referral & notes faxed to [facility], they will contact patient with appt, left mess w/ patient's husband about referral

## 2012-03-30 ENCOUNTER — Encounter: Payer: Self-pay | Admitting: Family Medicine

## 2012-03-30 ENCOUNTER — Ambulatory Visit (INDEPENDENT_AMBULATORY_CARE_PROVIDER_SITE_OTHER): Payer: MEDICARE | Admitting: Family Medicine

## 2012-03-30 VITALS — BP 112/80 | HR 105 | Resp 16 | Ht 65.0 in | Wt 142.0 lb

## 2012-03-30 DIAGNOSIS — I10 Essential (primary) hypertension: Secondary | ICD-10-CM

## 2012-03-30 DIAGNOSIS — R110 Nausea: Secondary | ICD-10-CM

## 2012-03-30 DIAGNOSIS — R5381 Other malaise: Secondary | ICD-10-CM

## 2012-03-30 DIAGNOSIS — K5900 Constipation, unspecified: Secondary | ICD-10-CM

## 2012-03-30 DIAGNOSIS — L989 Disorder of the skin and subcutaneous tissue, unspecified: Secondary | ICD-10-CM

## 2012-03-30 DIAGNOSIS — E785 Hyperlipidemia, unspecified: Secondary | ICD-10-CM

## 2012-03-30 NOTE — Patient Instructions (Signed)
F/u in 6 month.  Please call if you need me before  I am happy that ypu feel much better, weight is stable and good.  Continue your healthy lifestyle  No changes in medication.  Fasting cbc, lipid and cmp asap.  Fasting lipid and cmp in 6 months, before next visit.

## 2012-03-30 NOTE — Assessment & Plan Note (Signed)
Hyperlipidemia:Low fat diet discussed and encouraged.  Updated labs needed 

## 2012-03-30 NOTE — Progress Notes (Signed)
  Subjective:    Patient ID: Kelsey Nelson, female    DOB: Mar 04, 1934, 78 y.o.   MRN: [id number]  HPI The PT is here for follow up and re-evaluation of chronic medical conditions, medication management and review of any available recent lab and radiology data.  Preventive health is updated, specifically  Cancer screening and Immunization.   Questions or concerns regarding consultations or procedures which the PT has had in the interim are  Addressed.Has upcoming appointment at [facility] for however c/o chronic nausea for 1 year. She is reassured by the fact that her weight is stable and states her symptom is less frequent and less severe and is most often in the evening. She exercises on average 3 times per week The PT denies any adverse reactions to current medications since the last visit.  There are no new concerns.  There are no specific complaints       Review of Systems See HPI Denies recent fever or chills. Denies sinus pressure, nasal congestion, ear pain or sore throat. Denies chest congestion, productive cough or wheezing. Denies chest pains, palpitations and leg swelling Denies abdominal pain,  vomiting,diarrhea or constipation.   Denies dysuria,  hesitancy or incontinence.nocturia persists on average every 2 hrs she voids Denies joint pain, swelling and limitation in mobility. Denies headaches, seizures, numbness, or tingling. Denies depression, anxiety or insomnia. Denies skin break down or rash.        Objective:   Physical Exam Patient alert and oriented and in no cardiopulmonary distress.  HEENT: No facial asymmetry, EOMI, no sinus tenderness,  oropharynx pink and moist.  Neck supple no adenopathy.  Chest: Clear to auscultation bilaterally.  CVS: S1, S2 no murmurs, no S3.  ABD: Soft non tender. Bowel sounds normal.  Ext: No edema  MS: Adequate ROM spine, shoulders, hips and knees.  Skin: Intact, no ulcerations or rash noted.  Psych: Good eye  contact, normal affect. Memory intact not anxious or depressed appearing.  CNS: CN 2-12 intact, power, tone and sensation normal throughout.        Assessment & Plan:

## 2012-03-30 NOTE — Assessment & Plan Note (Signed)
Has had derm eval

## 2012-03-30 NOTE — Assessment & Plan Note (Signed)
Controlled, no change in medication  

## 2012-03-30 NOTE — Assessment & Plan Note (Signed)
Improved, has upcoming GI eval at tertiary center

## 2012-03-30 NOTE — Assessment & Plan Note (Signed)
Daily stool softener negates this complaint, pt reassured

## 2012-03-31 ENCOUNTER — Ambulatory Visit: Payer: Medicare FFS | Admitting: Family Medicine

## 2012-04-04 LAB — COMPLETE METABOLIC PANEL WITH GFR
BUN: 17 mg/dL (ref 6–23)
CO2: 26 mEq/L (ref 19–32)
Calcium: 9.8 mg/dL (ref 8.4–10.5)
Creat: 0.9 mg/dL (ref 0.50–1.10)
GFR, Est African American: 72 mL/min
GFR, Est Non African American: 63 mL/min
Glucose, Bld: 99 mg/dL (ref 70–99)
Total Bilirubin: 0.3 mg/dL (ref 0.3–1.2)

## 2012-04-04 LAB — LIPID PANEL
Cholesterol: 181 mg/dL (ref 0–200)
HDL: 91 mg/dL (ref >39)
Triglycerides: 89 mg/dL (ref <150)

## 2012-04-04 LAB — CBC WITH DIFFERENTIAL/PLATELET
Basophils Absolute: 0 10*3/uL (ref 0.0–0.1)
HCT: 42 % (ref 36.0–46.0)
Lymphocytes Relative: 30 % (ref 12–46)
Monocytes Absolute: 0.5 10*3/uL (ref 0.1–1.0)
Neutro Abs: 2.5 10*3/uL (ref 1.7–7.7)
Neutrophils Relative %: 57 % (ref 43–77)
Platelets: 304 10*3/uL (ref 150–400)
RDW: 14.2 % (ref 11.5–15.5)
WBC: 4.5 10*3/uL (ref 4.0–10.5)

## 2012-04-21 DIAGNOSIS — Z8719 Personal history of other diseases of the digestive system: Secondary | ICD-10-CM | POA: Insufficient documentation

## 2012-04-21 DIAGNOSIS — K862 Cyst of pancreas: Secondary | ICD-10-CM | POA: Insufficient documentation

## 2012-04-25 ENCOUNTER — Other Ambulatory Visit: Payer: Self-pay | Admitting: Family Medicine

## 2012-07-06 ENCOUNTER — Ambulatory Visit (INDEPENDENT_AMBULATORY_CARE_PROVIDER_SITE_OTHER): Payer: MEDICARE | Admitting: Internal Medicine

## 2012-07-20 ENCOUNTER — Ambulatory Visit (INDEPENDENT_AMBULATORY_CARE_PROVIDER_SITE_OTHER): Payer: Medicare Other | Admitting: Family Medicine

## 2012-07-20 ENCOUNTER — Encounter: Payer: Self-pay | Admitting: Family Medicine

## 2012-07-20 VITALS — BP 120/82 | HR 109 | Resp 16 | Ht 65.0 in | Wt 137.4 lb

## 2012-07-20 DIAGNOSIS — Z0000 Encounter for general adult medical examination without abnormal findings: Secondary | ICD-10-CM

## 2012-07-20 DIAGNOSIS — F329 Major depressive disorder, single episode, unspecified: Secondary | ICD-10-CM

## 2012-07-20 MED ORDER — SERTRALINE HCL 25 MG PO TABS: 25.0000 mg | ORAL_TABLET | Freq: Every day | ORAL | Status: DC

## 2012-07-20 MED ORDER — IMIPRAMINE HCL 25 MG PO TABS: 25.0000 mg | ORAL_TABLET | Freq: Every day | ORAL | Status: DC

## 2012-07-20 NOTE — Progress Notes (Signed)
Subjective:    Patient ID: Kelsey Nelson, female    DOB: January 03, 1934, 78 y.o.   MRN: [id number]  HPI   Pt also in for review of chronic illness. States in the past 3 months , since being treated for influenza she has no taste, she enjoys no food and has difficulty with all solid food. States at times she is unsure if she wants to live, no suicidal or homicidal ideation,no hallucinatios Preventive Screening-Counseling & Management   Patient present here today for a Medicare annual wellness visit.   Current Problems (verified)   Medications Prior to Visit Allergies (verified)   PAST HISTORY  Family History  Social History Married x 57 yrs, in love with spouse more every day. Mother of 3 adult children all healthy   Risk Factors Current exercise habits:  3 miles per day, 3 days pr week  Dietary issues discussed:no interest in food   Cardiac risk factors: none  Depression Screen  (Note: if answer to either of the following is "Yes", a more complete depression screening is indicated)   Over the past two weeks, have you felt down, depressed or hopeless? No  Over the past two weeks, have you felt little interest or pleasure in doing things? No  Have you lost interest or pleasure in daily life? yes  Do you often feel hopeless? yes Do you cry easily over simple problems? yes  Activities of Daily Living  In your present state of health, do you have any difficulty performing the following activities?  Driving?: No Managing money?: No Feeding yourself?:No Getting from bed to chair?:No Climbing a flight of stairs?:No Preparing food and eating?:No Bathing or showering?:No Getting dressed?:No Getting to the toilet?:No Using the toilet?:No Moving around from place to place?: No  Fall Risk Assessment In the past year have you fallen or had a near fall?:No Are you currently taking any medications that make you dizziness?:No   Hearing Difficulties: No Do you often ask  people to speak up or repeat themselves?:No Do you experience ringing or noises in your ears?:No Do you have difficulty understanding soft or whispered voices?:No  Cognitive Testing  Alert? Yes Normal Appearance?Yes  Oriented to person? Yes Place? Yes  Time? Yes  Displays appropriate judgment?Yes  Can read the correct time from a watch face? yes Are you having problems remembering things?No  Advanced Directives have been discussed with the patient?Yes    List the Names of Other Physician/Practitioners you currently use: see chart   Indicate any recent Medical Services you may have received from other than [facility] providers in the past year (date may be approximate).   Assessment:    Annual Wellness Exam   Plan:    During the course of the visit the patient was educated and counseled about appropriate screening and preventive services including:  A healthy diet is rich in fruit, vegetables and whole grains. Poultry fish, nuts and beans are a healthy choice for protein rather then red meat. A low sodium diet and drinking 64 ounces of water daily is generally recommended. Oils and sweet should be limited. Carbohydrates especially for those who are diabetic or overweight, should be limited to 30-45 gram per meal. It is important to eat on a regular schedule, at least 3 times daily. Snacks should be primarily fruits, vegetables or nuts. It is important that you exercise regularly at least 30 minutes 5 times a week. If you develop chest pain, have severe difficulty breathing, or feel very tired, stop  exercising immediately and seek medical attention  Immunization reviewed and updated. Cancer screening reviewed and updated    Patient Instructions (the written plan) was given to the patient.  Medicare Attestation  I have personally reviewed:  The patient's medical and social history  Their use of alcohol, tobacco or illicit drugs  Their current medications and supplements  The patient's  functional ability including ADLs,fall risks, home safety risks, cognitive, and hearing and visual impairment  Diet and physical activities  Evidence for depression or mood disorders  The patient's weight, height, BMI, and visual acuity have been recorded in the chart. I have made referrals, counseling, and provided education to the patient based on review of the above and I have provided the patient with a written personalized care plan for preventive services.      Review of Systems     Objective:   Physical Exam        Assessment & Plan:

## 2012-07-20 NOTE — Patient Instructions (Addendum)
F/u in 6 to 8 weeks. Imipramine is changed to 25mg  one daily. New is zoloft 25mg  one daily  Continue to exercise, increase to 5 days per week.  Call with concerns  Hope you start feeling better soon

## 2012-07-31 ENCOUNTER — Ambulatory Visit (HOSPITAL_COMMUNITY)
Admission: RE | Admit: 2012-07-31 | Discharge: 2012-07-31 | Disposition: A | Discharge status: Home / Self Care | Payer: MEDICARE | Source: Ambulatory Visit | Attending: Family Medicine | Admitting: Family Medicine

## 2012-07-31 ENCOUNTER — Encounter: Payer: Self-pay | Admitting: Family Medicine

## 2012-07-31 ENCOUNTER — Telehealth: Payer: Self-pay | Admitting: Family Medicine

## 2012-07-31 ENCOUNTER — Ambulatory Visit (INDEPENDENT_AMBULATORY_CARE_PROVIDER_SITE_OTHER): Payer: MEDICARE | Admitting: Family Medicine

## 2012-07-31 VITALS — BP 118/74 | HR 90 | Resp 15 | Ht 65.0 in | Wt 137.0 lb

## 2012-07-31 DIAGNOSIS — N390 Urinary tract infection, site not specified: Secondary | ICD-10-CM

## 2012-07-31 DIAGNOSIS — R109 Unspecified abdominal pain: Secondary | ICD-10-CM | POA: Insufficient documentation

## 2012-07-31 DIAGNOSIS — R439 Unspecified disturbances of smell and taste: Secondary | ICD-10-CM

## 2012-07-31 DIAGNOSIS — R432 Parageusia: Secondary | ICD-10-CM | POA: Insufficient documentation

## 2012-07-31 DIAGNOSIS — N3090 Cystitis, unspecified without hematuria: Secondary | ICD-10-CM | POA: Insufficient documentation

## 2012-07-31 DIAGNOSIS — Z0000 Encounter for general adult medical examination without abnormal findings: Secondary | ICD-10-CM | POA: Insufficient documentation

## 2012-07-31 LAB — COMPREHENSIVE METABOLIC PANEL
ALT: 16 U/L (ref 0–35)
AST: 19 U/L (ref 0–37)
Albumin: 3.6 g/dL (ref 3.5–5.2)
Alkaline Phosphatase: 121 U/L — ABNORMAL HIGH (ref 39–117)
Calcium: 9.9 mg/dL (ref 8.4–10.5)
Chloride: 101 mEq/L (ref 96–112)
Creat: 0.97 mg/dL (ref 0.50–1.10)
Potassium: 4.3 mEq/L (ref 3.5–5.3)

## 2012-07-31 LAB — POCT URINALYSIS DIPSTICK
Bilirubin, UA: NEGATIVE
Glucose, UA: NEGATIVE
Ketones, UA: NEGATIVE
Spec Grav, UA: 1.015

## 2012-07-31 LAB — CBC WITH DIFFERENTIAL/PLATELET
Basophils Absolute: 0 10*3/uL (ref 0.0–0.1)
Basophils Relative: 0 % (ref 0–1)
Eosinophils Relative: 1 % (ref 0–5)
HCT: 38.6 % (ref 36.0–46.0)
MCHC: 32.4 g/dL (ref 30.0–36.0)
MCV: 91.3 fL (ref 78.0–100.0)
Monocytes Absolute: 0.6 10*3/uL (ref 0.1–1.0)
RDW: 13.7 % (ref 11.5–15.5)

## 2012-07-31 MED ORDER — CEPHALEXIN 500 MG PO CAPS: 500.0000 mg | ORAL_CAPSULE | Freq: Two times a day (BID) | ORAL | Status: AC

## 2012-07-31 MED ORDER — SERTRALINE HCL 50 MG PO TABS: 50.0000 mg | ORAL_TABLET | Freq: Every day | ORAL | Status: DC

## 2012-07-31 NOTE — Telephone Encounter (Signed)
Faxed in

## 2012-07-31 NOTE — Assessment & Plan Note (Signed)
She is not having any particular urine symptoms however with suprapubic pain and her abnormal UA will start on antibiotics and culture.

## 2012-07-31 NOTE — Assessment & Plan Note (Addendum)
Unknown cause, pt has diverticulosis however, no change in stools, no fever, fairly benign exam. UA concern for infection, though I am not thoroughly convinced this is cause of pain Send for abdominal xray, CMET, CBC Given red flags

## 2012-07-31 NOTE — Progress Notes (Signed)
  Subjective:    Patient ID: Kelsey Nelson, female    DOB: 02/02/1934, 78 y.o.   MRN: [id number]  HPI  Pt here with abdominal pain x 2 days, history of constipation and diverticulosis but never had diverticulitis.  Past 2 days has lower abdominal pain sharp in nature, worse with walking, passing gas and bowel movements. Pain is non radiating, she has chronic nausea unchanged, states BM are regular with aid of stool softener, no diarrhea, no blood in stool, no dysuria, no vaginal discharge.   She is also concerned about loss of taste x 1 month, no previous URI, sinus infection, new medication Zoloft started 1 week ago, history of GERD, doing well on PPI  Review of Systems   GEN- denies fatigue, fever, weight loss,weakness, recent illness HEENT- denies eye drainage, change in vision, nasal discharge, CVS- denies chest pain, palpitations RESP- denies SOB, cough, wheeze ABD-  N/V, change in stools, +abd pain GU- denies dysuria, hematuria, dribbling, incontinence MSK- denies joint pain, muscle aches, injury Neuro- denies headache, dizziness, syncope, seizure activity      Objective:   Physical Exam GEN- NAD, alert and oriented x3 HEENT- PERRL, EOMI, non injected sclera, pink conjunctiva, MMM, oropharynx clear CVS- RRR, no murmur RESP-CTAB ABD-NABS,soft,mild TTP suprapubic region, and mid lower quadrants, no rebound, no gaurding, non distended, no CVA tenderness EXT- No edema Pulses- Radial, DP- 2+        Assessment & Plan:

## 2012-07-31 NOTE — Assessment & Plan Note (Signed)
Annual wellness visit completed . Major issue is with regard to ongoing poor appetite and nausea. Pt to start SSRI .

## 2012-07-31 NOTE — Assessment & Plan Note (Signed)
Refer to ENT, difficulty x 1 month

## 2012-07-31 NOTE — Telephone Encounter (Signed)
I spoke with pt today. She agrees to increase dose of zoloft to 50 mg daily, will take two 25 mg tablets daily till done. Pls fax new rx to her mail order with dose increase

## 2012-07-31 NOTE — Patient Instructions (Signed)
You will be referred to ENT for the loss in taste  Get the xray at [facility] the antibiotics for the urine infection Get the labs I will call with results Keep previous f/u appt with Dr. Lodema Hong

## 2012-07-31 NOTE — Assessment & Plan Note (Signed)
Pt to start zoloft 25mg  daily

## 2012-08-05 ENCOUNTER — Other Ambulatory Visit: Payer: Self-pay | Admitting: Family Medicine

## 2012-08-05 DIAGNOSIS — Z139 Encounter for screening, unspecified: Secondary | ICD-10-CM

## 2012-08-05 DIAGNOSIS — Z1231 Encounter for screening mammogram for malignant neoplasm of breast: Secondary | ICD-10-CM

## 2012-08-06 ENCOUNTER — Ambulatory Visit (INDEPENDENT_AMBULATORY_CARE_PROVIDER_SITE_OTHER): Payer: Medicare Other | Admitting: Otolaryngology

## 2012-08-06 DIAGNOSIS — R439 Unspecified disturbances of smell and taste: Secondary | ICD-10-CM

## 2012-09-21 ENCOUNTER — Ambulatory Visit (HOSPITAL_COMMUNITY): Payer: MEDICARE

## 2012-09-24 ENCOUNTER — Ambulatory Visit (INDEPENDENT_AMBULATORY_CARE_PROVIDER_SITE_OTHER): Payer: Medicare Other | Admitting: Family Medicine

## 2012-09-24 ENCOUNTER — Encounter: Payer: Self-pay | Admitting: Family Medicine

## 2012-09-24 VITALS — BP 140/80 | HR 80 | Resp 18 | Ht 65.0 in | Wt 139.1 lb

## 2012-09-24 DIAGNOSIS — R5381 Other malaise: Secondary | ICD-10-CM

## 2012-09-24 DIAGNOSIS — R439 Unspecified disturbances of smell and taste: Secondary | ICD-10-CM

## 2012-09-24 DIAGNOSIS — F3289 Other specified depressive episodes: Secondary | ICD-10-CM

## 2012-09-24 DIAGNOSIS — F329 Major depressive disorder, single episode, unspecified: Secondary | ICD-10-CM

## 2012-09-24 DIAGNOSIS — K219 Gastro-esophageal reflux disease without esophagitis: Secondary | ICD-10-CM

## 2012-09-24 DIAGNOSIS — R432 Parageusia: Secondary | ICD-10-CM

## 2012-09-24 DIAGNOSIS — G4700 Insomnia, unspecified: Secondary | ICD-10-CM

## 2012-09-24 DIAGNOSIS — R5383 Other fatigue: Secondary | ICD-10-CM

## 2012-09-24 DIAGNOSIS — E785 Hyperlipidemia, unspecified: Secondary | ICD-10-CM

## 2012-09-24 DIAGNOSIS — I10 Essential (primary) hypertension: Secondary | ICD-10-CM

## 2012-09-24 DIAGNOSIS — Z23 Encounter for immunization: Secondary | ICD-10-CM

## 2012-09-24 NOTE — Progress Notes (Signed)
  Subjective:    Patient ID: Kelsey Nelson, female    DOB: [birth year], 78 y.o.   MRN: [id number]  HPI The PT is here for follow up and re-evaluation of chronic medical conditions, medication management and review of any available recent lab and radiology data.  Preventive health is updated, specifically  Cancer screening and Immunization.   Questions or concerns regarding consultations or procedures which the PT has had in the interim are  addressed. The PT denies any adverse reactions to current medications since the last visit.  There are no new concerns. Reports she felt even better when zoloft dose increased to 50mg , her taste is back as is her appetite, her nausea, seems to do better when she takes her PPI regularly, so she will start a set schedule There are no specific complaints       Review of Systems See HPI Denies recent fever or chills. Denies sinus pressure, nasal congestion, ear pain or sore throat. Denies chest congestion, productive cough or wheezing. Denies chest pains, palpitations and leg swelling Denies abdominal pain,  vomiting,diarrhea or constipation.   Denies dysuria, frequency, hesitancy or incontinence. Denies joint pain, swelling and limitation in mobility. Denies headaches, seizures, numbness, or tingling. Denies depression, anxiety and reports improved sleep Denies skin break down or rash.        Objective:   Physical Exam  Patient alert and oriented and in no cardiopulmonary distress.  HEENT: No facial asymmetry, EOMI, no sinus tenderness,  oropharynx pink and moist.  Neck supple no adenopathy.  Chest: Clear to auscultation bilaterally.  CVS: S1, S2 no murmurs, no S3.  ABD: Soft non tender. Bowel sounds normal.  Ext: No edema  MS: Adequate ROM spine, shoulders, hips and knees.  Skin: Intact, no ulcerations or rash noted.  Psych: Good eye contact, normal affect. Memory intact not anxious or depressed appearing.  CNS: CN 2-12 intact,  power, tone and sensation normal throughout.       Assessment & Plan:

## 2012-09-24 NOTE — Patient Instructions (Addendum)
Annual wellness early February. Please call if you need me before.  I am happy that you are feeling better.  Please commit to omeprazole every Monday, Wednesday and Saturday, I think this will control the nausea.  We will refill meds, as requested.  Fasting lipid, cmp and TSH end January   Please commit to 30 minutes every day of physical activity and increased intake of fresh or frozen fruit and vegetable. Please cut back on salt and canned foods, blood pressure slightly elevated today  Flu shot today

## 2012-09-25 MED ORDER — ROSUVASTATIN CALCIUM 10 MG PO TABS: 10.0000 mg | ORAL_TABLET | Freq: Every day | ORAL | Status: DC

## 2012-09-26 NOTE — Assessment & Plan Note (Signed)
Marked improvement with increased dose of zoloft, will continue same

## 2012-09-26 NOTE — Assessment & Plan Note (Signed)
Improved now that she is taking zoloft regularly

## 2012-09-26 NOTE — Assessment & Plan Note (Signed)
Pt to commit to regularly taking med 4 days per week , as this controls the nausea which she has been experiencing better

## 2012-09-26 NOTE — Assessment & Plan Note (Signed)
Resolved following burst of prednisone by ENT

## 2012-09-26 NOTE — Assessment & Plan Note (Signed)
Controlled, no change in medication DASH diet and commitment to daily physical activity for a minimum of 30 minutes discussed and encouraged, as a part of hypertension management. The importance of attaining a healthy weight is also discussed.  

## 2012-09-26 NOTE — Assessment & Plan Note (Signed)
Hyperlipidemia:Low fat diet discussed and encouraged.  Controlled, no change in medication   

## 2012-09-28 ENCOUNTER — Ambulatory Visit (HOSPITAL_COMMUNITY)
Admission: RE | Admit: 2012-09-28 | Discharge: 2012-09-28 | Disposition: A | Discharge status: Home / Self Care | Payer: Medicare Other | Source: Ambulatory Visit | Attending: Family Medicine | Admitting: Family Medicine

## 2012-09-28 DIAGNOSIS — Z139 Encounter for screening, unspecified: Secondary | ICD-10-CM

## 2012-09-28 DIAGNOSIS — N6459 Other signs and symptoms in breast: Secondary | ICD-10-CM | POA: Insufficient documentation

## 2012-09-28 DIAGNOSIS — Z1231 Encounter for screening mammogram for malignant neoplasm of breast: Secondary | ICD-10-CM | POA: Insufficient documentation

## 2012-10-02 ENCOUNTER — Other Ambulatory Visit: Payer: Self-pay | Admitting: Family Medicine

## 2012-10-02 DIAGNOSIS — R928 Other abnormal and inconclusive findings on diagnostic imaging of breast: Secondary | ICD-10-CM

## 2012-10-20 ENCOUNTER — Ambulatory Visit: Payer: MEDICARE | Admitting: Family Medicine

## 2012-10-21 ENCOUNTER — Ambulatory Visit (HOSPITAL_COMMUNITY)
Admission: RE | Admit: 2012-10-21 | Discharge: 2012-10-21 | Disposition: A | Discharge status: Home / Self Care | Payer: Medicare Other | Source: Ambulatory Visit | Attending: Family Medicine | Admitting: Family Medicine

## 2012-10-21 DIAGNOSIS — R928 Other abnormal and inconclusive findings on diagnostic imaging of breast: Secondary | ICD-10-CM | POA: Insufficient documentation

## 2012-10-22 ENCOUNTER — Telehealth: Payer: Self-pay | Admitting: Family Medicine

## 2012-10-23 ENCOUNTER — Telehealth: Payer: Self-pay | Admitting: Family Medicine

## 2012-10-23 ENCOUNTER — Encounter: Payer: Self-pay | Admitting: Family Medicine

## 2012-10-23 ENCOUNTER — Ambulatory Visit (INDEPENDENT_AMBULATORY_CARE_PROVIDER_SITE_OTHER): Payer: Medicare Other | Admitting: Family Medicine

## 2012-10-23 VITALS — BP 130/78 | HR 81 | Resp 15 | Ht 65.0 in | Wt 139.1 lb

## 2012-10-23 DIAGNOSIS — R21 Rash and other nonspecific skin eruption: Secondary | ICD-10-CM | POA: Insufficient documentation

## 2012-10-23 MED ORDER — PREDNISONE 10 MG PO TABS: ORAL_TABLET | ORAL | Status: DC

## 2012-10-23 MED ORDER — METHYLPREDNISOLONE SODIUM SUCC 125 MG IJ SOLR
125.0000 mg | Freq: Once | INTRAMUSCULAR | Status: AC
  Administered 2012-10-23: 125 mg via INTRAMUSCULAR

## 2012-10-23 MED ORDER — HYDROXYZINE PAMOATE 25 MG PO CAPS: 25.0000 mg | ORAL_CAPSULE | Freq: Three times a day (TID) | ORAL | Status: DC | PRN

## 2012-10-23 NOTE — Progress Notes (Signed)
  Subjective:    Patient ID: Kelsey Nelson, female    DOB: 1934/09/09, 78 y.o.   MRN: [id number]  HPI  Patient presents with bilateral rash on forearms for the past 2 days. She states Wednesday she saw a mosquito bite her on both arms and since then she's been taking significantly. She has a redness on both arms and initially had some warmth but this is now resolved. She states that her arm was swollen a little but this is also better. She's been using Benadryl tablets as well as Benadryl cream hydrocortisone over-the-counter and a fungal/cortisone cream that her husband and. No meds these have helped the itching. She is unable to sleep at night secondary to severe itching. She denies any fever or feeling ill otherwise. No new soap/lotion , medications, other sick contact  Review of Systems  GEN- denies fatigue, fever, weight loss,weakness, recent illness HEENT- denies eye drainage, change in vision, nasal discharge, CVS- denies chest pain, palpitations RESP- denies SOB, cough, wheeze ABD- denies N/V, change in stools, abd pain MSK- denies joint pain, muscle aches, injury Neuro- denies headache, dizziness, syncope, seizure activity      Objective:   Physical Exam GEN-NAD, alert and oriented x 3 HEENT- no oral lesions, MMM Skin- erythema to bilateral forearms. Few Small macular lesions , no vesicles no blisters, arm Non-tender, mild swelling in area of erythema, no warmth  Pulse- Radial pulse 2+       Assessment & Plan:

## 2012-10-23 NOTE — Addendum Note (Signed)
Addended by: Abner Greenspan on: 10/23/2012 01:58 PM   Modules accepted: Orders

## 2012-10-23 NOTE — Telephone Encounter (Signed)
She has picked up her pills and has taken 3 of the itch pills with no relief. Wants to know if she can start the prednisone today or what else can she do?

## 2012-10-23 NOTE — Patient Instructions (Signed)
Solumedrol shot given today Start prednisone tomorrow Anti-itch medication Vistaril given If you develop signs of infection, fever, warmth to arm, worsening rash- please call so antibiotics can be added F/U as needed

## 2012-10-23 NOTE — Assessment & Plan Note (Signed)
?   Insect bite as culprit, localized reaction on bilateral arms, IM Solumedrol, Vistaril for itching, if any signs of infection would add Keflex

## 2012-10-23 NOTE — Telephone Encounter (Signed)
Patient aware.

## 2012-10-23 NOTE — Telephone Encounter (Signed)
Tell to start prednisone in the morning, do not take more than 2 tablets at a time for the itching medication She can also pick up Aveeno oatmeal for the skin

## 2012-10-28 ENCOUNTER — Other Ambulatory Visit: Payer: Self-pay

## 2012-10-28 MED ORDER — ROSUVASTATIN CALCIUM 10 MG PO TABS: 10.0000 mg | ORAL_TABLET | Freq: Every day | ORAL | Status: DC

## 2012-10-28 NOTE — Telephone Encounter (Signed)
Med sent to mail order 

## 2012-11-12 ENCOUNTER — Telehealth: Payer: Self-pay | Admitting: Family Medicine

## 2012-11-12 MED ORDER — SERTRALINE HCL 50 MG PO TABS: 50.0000 mg | ORAL_TABLET | Freq: Every day | ORAL | Status: DC

## 2012-11-12 NOTE — Telephone Encounter (Signed)
Med sent in.

## 2012-12-24 LAB — COMPREHENSIVE METABOLIC PANEL
ALT: 15 U/L (ref 0–35)
CO2: 30 mEq/L (ref 19–32)
Potassium: 4.2 mEq/L (ref 3.5–5.3)
Sodium: 139 mEq/L (ref 135–145)
Total Bilirubin: 0.4 mg/dL (ref 0.3–1.2)
Total Protein: 6.5 g/dL (ref 6.0–8.3)

## 2012-12-24 LAB — LIPID PANEL
HDL: 90 mg/dL (ref >39)
LDL Cholesterol: 89 mg/dL (ref 0–99)

## 2012-12-25 LAB — TSH: TSH: 2.669 u[IU]/mL (ref 0.350–4.500)

## 2012-12-30 ENCOUNTER — Other Ambulatory Visit: Payer: Self-pay | Admitting: Family Medicine

## 2013-01-26 ENCOUNTER — Ambulatory Visit (INDEPENDENT_AMBULATORY_CARE_PROVIDER_SITE_OTHER): Payer: Medicare Other | Admitting: Family Medicine

## 2013-01-26 ENCOUNTER — Encounter: Payer: Self-pay | Admitting: Family Medicine

## 2013-01-26 VITALS — BP 126/78 | HR 80 | Resp 18 | Ht 65.0 in | Wt 137.0 lb

## 2013-01-26 DIAGNOSIS — E785 Hyperlipidemia, unspecified: Secondary | ICD-10-CM

## 2013-01-26 DIAGNOSIS — K862 Cyst of pancreas: Secondary | ICD-10-CM

## 2013-01-26 DIAGNOSIS — R110 Nausea: Secondary | ICD-10-CM

## 2013-01-26 DIAGNOSIS — Z0000 Encounter for general adult medical examination without abnormal findings: Secondary | ICD-10-CM

## 2013-01-26 MED ORDER — ONDANSETRON HCL 4 MG PO TABS: 4.0000 mg | ORAL_TABLET | Freq: Every day | ORAL | Status: DC | PRN

## 2013-01-26 MED ORDER — PRAVASTATIN SODIUM 20 MG PO TABS: 20.0000 mg | ORAL_TABLET | Freq: Every day | ORAL | Status: DC

## 2013-01-26 NOTE — Progress Notes (Signed)
Subjective:    Patient ID: Kelsey Nelson, female    DOB: 03-11-1934, 79 y.o.   MRN: [id number]  HPI  Preventive Screening-Counseling & Management   Patient present here today for a Medicare annual wellness visit.   Current Problems (verified)   Medications Prior to Visit Allergies (verified)   PAST HISTORY  Family History: 2 sisters deceased, 1 with Down's died at age 94, there other sister died at age age 74 with pancreatic cancer. Brother died at age 66, had GB Mom died at age 65,had pulmonary fibrosis, father died at age 35   Social History  Married x 51 years  Mother of 3 adult children, and proud grandma of 4  And 2 great grand kids. 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 No  Over the past two weeks, have you felt little interest or pleasure in doing things? No  Have you lost interest or pleasure in daily life? No  Do you often feel hopeless? No  Do you cry easily over simple problems? No   Activities of Daily Living  In your present state of health, do you have any difficulty performing the following activities?  Driving?: No Managing money?: No Feeding yourself?:No Getting from bed to chair?:No Climbing a flight of stairs?:No Preparing food and eating?:No Bathing or showering?:No Getting dressed?:No Getting to the toilet?:No Using the toilet?:No Moving around from place to place?: No  Fall Risk Assessment In the past year have you fallen or had  a near fall?:No Are you currently taking any medications that make you dizziness?:No   Hearing Difficulties: No Do you often ask people to speak up or repeat themselves?:No Do you experience ringing or noises in your ears?:sometimes ringing Do you have difficulty understanding soft or whispered voices?:No  Cognitive Testing  Alert? Yes Normal Appearance?Yes  Oriented to person? Yes Place? Yes  Time? Yes  Displays appropriate judgment?Yes  Can read the correct time from a watch face? yes Are you having problems remembering things?No  Advanced Directives have been discussed with the patient?Yes    List the Names of Other Physician/Practitioners you currently use: Dr Karilyn Cota   Indicate any recent Medical Services you may have received from other than [facility] providers in the past year (date may be approximate).   Assessment:    Annual Wellness Exam   Plan:    During the course of the visit the patient was educated and counseled about appropriate screening and preventive services including:  A healthy diet is rich in fruit, vegetables and whole grains. Poultry fish, nuts and beans are a healthy choice for protein rather then red meat. A low sodium diet and drinking 64 ounces of water daily is generally recommended. Oils and sweet should be limited. Carbohydrates especially for those who are diabetic or overweight, should be limited to 30-45 gram per meal. It is important to eat on a regular schedule, at least 3 times daily.  Snacks should be primarily fruits, vegetables or nuts. It is important that you exercise regularly at least 30 minutes 5 times a week. If you develop chest pain, have severe difficulty breathing, or feel very tired, stop exercising immediately and seek medical attention  Immunization reviewed and updated. Cancer screening reviewed and updated    Patient Instructions (the written plan) was given to the patient.  Medicare Attestation  I have personally reviewed:   The patient's medical and social history  Their use of alcohol, tobacco or illicit drugs  Their current medications and supplements  The patient's functional ability including ADLs,fall risks, home safety risks, cognitive, and hearing and visual impairment  Diet and physical activities  Evidence for depression or mood disorders  The patient's weight, height, BMI, and visual acuity have been recorded in the chart. I have made referrals, counseling, and provided education to the patient based on review of the above and I have provided the patient with a written personalized care plan for preventive services.     Review of Systems     Objective:   Physical Exam        Assessment & Plan:

## 2013-01-26 NOTE — Patient Instructions (Addendum)
F/u in mid July to early August.   Fasting lipid and cmp prior to return visit.   Trial of zofran once daily for nausea during the day    You are referred for follow up MRI of abdomen and pelvis to see cysts on pancreas and on kidney

## 2013-02-05 DIAGNOSIS — K862 Cyst of pancreas: Secondary | ICD-10-CM

## 2013-02-05 HISTORY — DX: Cyst of pancreas: K86.2

## 2013-02-05 NOTE — Assessment & Plan Note (Signed)
Annual wellness completed as documented. Pt fully functional, still works part time  Has disabling nausea, will try zofran Need f/u abdominal scan past h/o renal and pancreatic cysts, and positive f/h of pancreatic cancer

## 2013-02-08 ENCOUNTER — Telehealth: Payer: Self-pay | Admitting: Family Medicine

## 2013-02-08 DIAGNOSIS — I10 Essential (primary) hypertension: Secondary | ICD-10-CM

## 2013-02-08 NOTE — Telephone Encounter (Signed)
Creatinine was drawn 12/24/2012, I think this can be used, pls let me know what radiology says, it was normal

## 2013-02-10 NOTE — Telephone Encounter (Signed)
LAB ORDERED AND ALREADY FAXED TO [facility]. WILL LET PT KNOW WHEN SHE CALLS THURSDAY

## 2013-02-10 NOTE — Telephone Encounter (Signed)
pls let pt know when she calls, she is to call tomorrow  her creatinine level needs to be re drawn so she can get the MRI scan of her abdomen, lab has to be within 4 weeks of the test, pls order the lab , she does not have to fast

## 2013-02-10 NOTE — Telephone Encounter (Signed)
Needs a new one she is out of town she is going to call tomorrow when she gets back

## 2013-02-11 ENCOUNTER — Ambulatory Visit (HOSPITAL_COMMUNITY): Payer: Medicare Other

## 2013-02-12 ENCOUNTER — Telehealth: Payer: Self-pay | Admitting: Family Medicine

## 2013-02-12 NOTE — Telephone Encounter (Signed)
Faxed over

## 2013-02-17 NOTE — Telephone Encounter (Signed)
Noted  

## 2013-02-18 ENCOUNTER — Ambulatory Visit (HOSPITAL_COMMUNITY)
Admission: RE | Admit: 2013-02-18 | Discharge: 2013-02-18 | Disposition: A | Discharge status: Home / Self Care | Payer: Medicare Other | Source: Ambulatory Visit | Attending: Family Medicine | Admitting: Family Medicine

## 2013-02-18 ENCOUNTER — Other Ambulatory Visit: Payer: Self-pay | Admitting: Family Medicine

## 2013-02-18 DIAGNOSIS — K862 Cyst of pancreas: Secondary | ICD-10-CM | POA: Insufficient documentation

## 2013-02-18 DIAGNOSIS — Q619 Cystic kidney disease, unspecified: Secondary | ICD-10-CM | POA: Insufficient documentation

## 2013-02-18 MED ORDER — GADOBENATE DIMEGLUMINE 529 MG/ML IV SOLN
12.0000 mL | Freq: Once | INTRAVENOUS | Status: AC | PRN
  Administered 2013-02-18: 12 mL via INTRAVENOUS

## 2013-05-05 ENCOUNTER — Telehealth: Payer: Self-pay | Admitting: Family Medicine

## 2013-05-05 MED ORDER — SERTRALINE HCL 50 MG PO TABS: 50.0000 mg | ORAL_TABLET | Freq: Every day | ORAL | Status: DC

## 2013-05-05 MED ORDER — RAMIPRIL 5 MG PO CAPS: ORAL_CAPSULE | ORAL | Status: DC

## 2013-05-05 NOTE — Telephone Encounter (Signed)
[facility] in system as her pharmacy

## 2013-09-13 ENCOUNTER — Telehealth: Payer: Self-pay | Admitting: Family Medicine

## 2013-09-13 NOTE — Telephone Encounter (Signed)
I had called pt , she needs annual wellness, and will need fasting lipid, cmp before the visit, also flu vaccine is available. Pls schedule if she agrees, pls encourage her to make appt as medicare requires AWV and I would alos like to see how she is doing before the end of the year, no appt is currently in the susytem for her

## 2013-09-13 NOTE — Telephone Encounter (Signed)
I don't see any messages on patient, did you call her?

## 2013-09-16 ENCOUNTER — Telehealth: Payer: Self-pay | Admitting: Family Medicine

## 2013-09-16 DIAGNOSIS — E785 Hyperlipidemia, unspecified: Secondary | ICD-10-CM

## 2013-09-16 DIAGNOSIS — I10 Essential (primary) hypertension: Secondary | ICD-10-CM

## 2013-09-16 NOTE — Telephone Encounter (Signed)
Patient had a AWV 2.4.2014 patient scheduled an appointment for November

## 2013-09-16 NOTE — Telephone Encounter (Signed)
Called patient and could not leave a message

## 2013-09-16 NOTE — Telephone Encounter (Signed)
Noted.  Lipid and cmp ordered per last visit.  Lab requisition mailed to patient at her request.

## 2013-09-28 ENCOUNTER — Telehealth: Payer: Self-pay | Admitting: Family Medicine

## 2013-09-28 MED ORDER — RAMIPRIL 5 MG PO CAPS: ORAL_CAPSULE | ORAL | Status: DC

## 2013-09-28 MED ORDER — SERTRALINE HCL 50 MG PO TABS: 50.0000 mg | ORAL_TABLET | Freq: Every day | ORAL | Status: DC

## 2013-09-28 NOTE — Telephone Encounter (Signed)
meds sent

## 2013-10-04 LAB — LIPID PANEL
Cholesterol: 203 mg/dL — ABNORMAL HIGH (ref 0–200)
HDL: 82 mg/dL (ref >39)
Total CHOL/HDL Ratio: 2.5 Ratio
Triglycerides: 133 mg/dL (ref <150)

## 2013-10-04 LAB — COMPREHENSIVE METABOLIC PANEL
Albumin: 3.8 g/dL (ref 3.5–5.2)
Alkaline Phosphatase: 105 U/L (ref 39–117)
BUN: 13 mg/dL (ref 6–23)
Creat: 0.89 mg/dL (ref 0.50–1.10)
Glucose, Bld: 89 mg/dL (ref 70–99)
Total Bilirubin: 0.5 mg/dL (ref 0.3–1.2)

## 2013-10-15 ENCOUNTER — Other Ambulatory Visit: Payer: Self-pay | Admitting: Family Medicine

## 2013-10-15 DIAGNOSIS — Z139 Encounter for screening, unspecified: Secondary | ICD-10-CM

## 2013-10-21 ENCOUNTER — Ambulatory Visit (HOSPITAL_COMMUNITY)
Admission: RE | Admit: 2013-10-21 | Discharge: 2013-10-21 | Disposition: A | Discharge status: Home / Self Care | Payer: Medicare Other | Source: Ambulatory Visit | Attending: Family Medicine | Admitting: Family Medicine

## 2013-10-21 DIAGNOSIS — Z139 Encounter for screening, unspecified: Secondary | ICD-10-CM

## 2013-10-21 DIAGNOSIS — Z1231 Encounter for screening mammogram for malignant neoplasm of breast: Secondary | ICD-10-CM | POA: Insufficient documentation

## 2013-10-26 ENCOUNTER — Encounter: Payer: Self-pay | Admitting: Family Medicine

## 2013-10-26 ENCOUNTER — Ambulatory Visit (INDEPENDENT_AMBULATORY_CARE_PROVIDER_SITE_OTHER): Payer: Medicare Other | Admitting: Family Medicine

## 2013-10-26 ENCOUNTER — Encounter (INDEPENDENT_AMBULATORY_CARE_PROVIDER_SITE_OTHER): Payer: Self-pay

## 2013-10-26 VITALS — BP 118/74 | HR 89 | Resp 16 | Wt 132.1 lb

## 2013-10-26 DIAGNOSIS — E785 Hyperlipidemia, unspecified: Secondary | ICD-10-CM

## 2013-10-26 DIAGNOSIS — N3000 Acute cystitis without hematuria: Secondary | ICD-10-CM

## 2013-10-26 DIAGNOSIS — N289 Disorder of kidney and ureter, unspecified: Secondary | ICD-10-CM

## 2013-10-26 DIAGNOSIS — Z1211 Encounter for screening for malignant neoplasm of colon: Secondary | ICD-10-CM

## 2013-10-26 DIAGNOSIS — G4700 Insomnia, unspecified: Secondary | ICD-10-CM

## 2013-10-26 DIAGNOSIS — R110 Nausea: Secondary | ICD-10-CM

## 2013-10-26 DIAGNOSIS — R194 Change in bowel habit: Secondary | ICD-10-CM

## 2013-10-26 DIAGNOSIS — N3001 Acute cystitis with hematuria: Secondary | ICD-10-CM | POA: Insufficient documentation

## 2013-10-26 DIAGNOSIS — R3915 Urgency of urination: Secondary | ICD-10-CM

## 2013-10-26 DIAGNOSIS — R195 Other fecal abnormalities: Secondary | ICD-10-CM

## 2013-10-26 DIAGNOSIS — R198 Other specified symptoms and signs involving the digestive system and abdomen: Secondary | ICD-10-CM

## 2013-10-26 DIAGNOSIS — K5730 Diverticulosis of large intestine without perforation or abscess without bleeding: Secondary | ICD-10-CM

## 2013-10-26 DIAGNOSIS — R634 Abnormal weight loss: Secondary | ICD-10-CM

## 2013-10-26 DIAGNOSIS — K219 Gastro-esophageal reflux disease without esophagitis: Secondary | ICD-10-CM

## 2013-10-26 DIAGNOSIS — Z23 Encounter for immunization: Secondary | ICD-10-CM

## 2013-10-26 DIAGNOSIS — I10 Essential (primary) hypertension: Secondary | ICD-10-CM

## 2013-10-26 HISTORY — DX: Acute cystitis with hematuria: N30.01

## 2013-10-26 LAB — POCT URINALYSIS DIPSTICK
Bilirubin, UA: NEGATIVE
Ketones, UA: NEGATIVE
Protein, UA: NEGATIVE

## 2013-10-26 LAB — POC HEMOCCULT BLD/STL (OFFICE/1-CARD/DIAGNOSTIC)

## 2013-10-26 MED ORDER — MIRTAZAPINE 7.5 MG PO TABS: 7.5000 mg | ORAL_TABLET | Freq: Every day | ORAL | Status: DC

## 2013-10-26 MED ORDER — CIPROFLOXACIN HCL 500 MG PO TABS: 500.0000 mg | ORAL_TABLET | Freq: Two times a day (BID) | ORAL | Status: AC

## 2013-10-26 NOTE — Patient Instructions (Addendum)
F/u in 2 month, call if you need me before  Recent labs are excellent, no changes in medication.  Cholesterol is slightly elevated (203) so please watch fatty and fried foods  I am very concerned about weight loss with c/o change in character of stool and ongoing nausea  You will be referred to Dr Karilyn Cota, rectal exam today , no mass felt, and no hidden blood in stool  You are being referred for pelvic scan due to localized LLQ pain  and fullness  Your urine is being tested for infection looks as if you do have an infection and medication will be sent in, and due to increased urinary urgency , which is also disrupting sleep, I am requesting that Dr Annabell Howells re evaluate you  I am suggesting an additional medication at night, remeron, which will help both with sleep and appetite  Flu vaccine today  Stool samples to be sent for ova and parasites, you will get containers to take to lab with specimen

## 2013-10-26 NOTE — Progress Notes (Signed)
  Subjective:    Patient ID: Kelsey Nelson, female    DOB: 1934/12/21, 79 y.o.   MRN: [id number]  HPI 1 month h/o change in bowel movements, string stool, pebbly stool, black stool, when she does not take the [place], this is new. She has  relied in the past on [place] on an intermittent basis, but now concludes that she needs it daily Also I made her aware that by special request of her daughter , I was going to be testing her for parasites to see if this has anything to do with her  nausea, which continues on a daily basis, which is somewhat relieved by eating varying foods, and does not cause vomiting. 1 month h/o lower abdominal pain and pressure, has the urge to urinate every 15 minutes though not voiding as often, denies frequency or dysuria, no fever , chills or flank pain Denies recent fever or chills. Denies depression No longer driving due to urinary frequency   Review of Systems See HPI Denies recent fever or chills.c/o poor appetite and weight loss, the extent of which she was unaware Denies sinus pressure, nasal congestion, ear pain or sore throat. Denies chest congestion, productive cough or wheezing. Denies chest pains, palpitations and leg swelling    Denies dysuria, frequency, hesitancy or incontinence. Denies joint pain, swelling and limitation in mobility. Denies headaches, seizures, numbness, or tingling. Denies depression or  anxiety , does have  Insomnia.Some of which is provoked by her urinary symptoms Denies skin break down or rash.        Objective:   Physical Exam  Patient alert and oriented and in no cardiopulmonary distress.Pt has visibly lost weight  HEENT: No facial asymmetry, EOMI, no sinus tenderness,  oropharynx pink and moist.  Neck supple no adenopathy.  Chest: Clear to auscultation bilaterally.  CVS: S1, S2 no murmurs, no S3.  ABD: Soft left lower quadrant tenderness with possible mass, mild guarding, no rebound tenderness. Bowel sounds  normal Rectal: no palpable mass, heme negative stool  Ext: No edema  MS: Adequate ROM spine, shoulders, hips and knees.  Skin: Intact, no ulcerations or rash noted.  Psych: Good eye contact, normal affect. Memory intact not anxious or depressed appearing.  CNS: CN 2-12 intact, power, tone and sensation normal throughout.       Assessment & Plan:

## 2013-10-27 ENCOUNTER — Other Ambulatory Visit: Payer: Self-pay | Admitting: Family Medicine

## 2013-10-28 LAB — URINE CULTURE

## 2013-10-31 DIAGNOSIS — R3915 Urgency of urination: Secondary | ICD-10-CM | POA: Insufficient documentation

## 2013-10-31 DIAGNOSIS — R194 Change in bowel habit: Secondary | ICD-10-CM | POA: Insufficient documentation

## 2013-10-31 NOTE — Assessment & Plan Note (Signed)
Sleep hygiene reviewed, trial of remeron for sleep and appetite

## 2013-10-31 NOTE — Assessment & Plan Note (Signed)
Asymptomatic as far as dysuria is concerned, c/o nocturia "chronic" Abnormal Ua, treated presumptively f/u on culture

## 2013-10-31 NOTE — Assessment & Plan Note (Signed)
Worsening symptoms of urinary urgency, disrupts sleep a lot. C/o pelvic pressure/fullness Refer for urology to re eval

## 2013-10-31 NOTE — Assessment & Plan Note (Signed)
Controlled, no change in medication  

## 2013-10-31 NOTE — Assessment & Plan Note (Signed)
Unchanged, persistent

## 2013-10-31 NOTE — Assessment & Plan Note (Signed)
Weight loss , and change in stool, GI to re evaluate. Stool sent for testing for ova and parasites

## 2013-10-31 NOTE — Assessment & Plan Note (Signed)
Slightly elevated LDL, dietary modification, no med change

## 2013-10-31 NOTE — Assessment & Plan Note (Addendum)
continue medication, unsure if nausea is from GERD, has had extensive GI evakl i the past for her nausea which remains a challenge

## 2013-10-31 NOTE — Assessment & Plan Note (Signed)
Localized pain and  "fullness" in left lower abdomen, will order repeat scan. Pt also has c/o chnage in stool caliber and weight loss in the past 6 month, with chronic daily nausea and poor apetite Start remeron. Refer to GI for re eval

## 2013-11-01 ENCOUNTER — Telehealth: Payer: Self-pay | Admitting: Family Medicine

## 2013-11-02 NOTE — Telephone Encounter (Signed)
Patient is aware 

## 2013-11-03 ENCOUNTER — Encounter (HOSPITAL_COMMUNITY): Payer: Self-pay

## 2013-11-03 ENCOUNTER — Ambulatory Visit (HOSPITAL_COMMUNITY)
Admission: RE | Admit: 2013-11-03 | Discharge: 2013-11-03 | Disposition: A | Discharge status: Home / Self Care | Payer: Medicare Other | Source: Ambulatory Visit | Attending: Family Medicine | Admitting: Family Medicine

## 2013-11-03 DIAGNOSIS — K5732 Diverticulitis of large intestine without perforation or abscess without bleeding: Secondary | ICD-10-CM | POA: Insufficient documentation

## 2013-11-03 DIAGNOSIS — R109 Unspecified abdominal pain: Secondary | ICD-10-CM | POA: Insufficient documentation

## 2013-11-03 DIAGNOSIS — K5730 Diverticulosis of large intestine without perforation or abscess without bleeding: Secondary | ICD-10-CM | POA: Insufficient documentation

## 2013-11-03 DIAGNOSIS — R634 Abnormal weight loss: Secondary | ICD-10-CM

## 2013-11-03 DIAGNOSIS — R195 Other fecal abnormalities: Secondary | ICD-10-CM

## 2013-11-03 DIAGNOSIS — Q618 Other cystic kidney diseases: Secondary | ICD-10-CM | POA: Insufficient documentation

## 2013-11-05 ENCOUNTER — Telehealth: Payer: Self-pay | Admitting: Family Medicine

## 2013-11-05 NOTE — Telephone Encounter (Signed)
Patient is aware and information mailed to her explaining the findings

## 2013-11-05 NOTE — Telephone Encounter (Signed)
pls contact pt with report of her recent abdominal scan, seems as if I had not forwarded message about the result. Thank you

## 2013-11-09 ENCOUNTER — Ambulatory Visit (INDEPENDENT_AMBULATORY_CARE_PROVIDER_SITE_OTHER): Payer: Medicare Other | Admitting: Internal Medicine

## 2013-11-09 ENCOUNTER — Telehealth (INDEPENDENT_AMBULATORY_CARE_PROVIDER_SITE_OTHER): Payer: Self-pay | Admitting: *Deleted

## 2013-11-09 ENCOUNTER — Encounter (INDEPENDENT_AMBULATORY_CARE_PROVIDER_SITE_OTHER): Payer: Self-pay | Admitting: Internal Medicine

## 2013-11-09 ENCOUNTER — Other Ambulatory Visit (INDEPENDENT_AMBULATORY_CARE_PROVIDER_SITE_OTHER): Payer: Self-pay | Admitting: *Deleted

## 2013-11-09 VITALS — BP 108/70 | HR 80 | Temp 98.1°F | Ht 65.5 in | Wt 135.4 lb

## 2013-11-09 DIAGNOSIS — Z800 Family history of malignant neoplasm of digestive organs: Secondary | ICD-10-CM | POA: Insufficient documentation

## 2013-11-09 DIAGNOSIS — D369 Benign neoplasm, unspecified site: Secondary | ICD-10-CM

## 2013-11-09 DIAGNOSIS — Z86010 Personal history of colonic polyps: Secondary | ICD-10-CM

## 2013-11-09 DIAGNOSIS — K5732 Diverticulitis of large intestine without perforation or abscess without bleeding: Secondary | ICD-10-CM

## 2013-11-09 DIAGNOSIS — K5792 Diverticulitis of intestine, part unspecified, without perforation or abscess without bleeding: Secondary | ICD-10-CM

## 2013-11-09 DIAGNOSIS — Z1211 Encounter for screening for malignant neoplasm of colon: Secondary | ICD-10-CM

## 2013-11-09 HISTORY — DX: Family history of malignant neoplasm of digestive organs: Z80.0

## 2013-11-09 HISTORY — DX: Benign neoplasm, unspecified site: D36.9

## 2013-11-09 NOTE — Progress Notes (Signed)
Subjective:     Patient ID: Kelsey Nelson, female   DOB: [birth year], 79 y.o.   MRN: [id number]  HPI Referred to our office by Dr. Lodema Hong for pain left lower quadrant. CT scan revealed mild changes of diverticulitis. She was placed on ? Antibiotic for 5 days. Her pain resolved 3 days after starting the antibiotics. There was no diarrhea. She was constipated. There was no fever. She denies prior hx of diverticulitis.  She tells me today she has no pain. For the most part she feels good. Appetite is good. She has lost 7 pounds in the past year, though she does tell me she has had nausea for 3 yrs. No dysphagia. She usually has a BM daily. Stools are hard sometimes and sometimes they are soft. She does take a stool softener every day. She also eats Activia daily. She does have a lot of gas and she burps frequently,.  11/05/2013 CT abdomen/pelvis with CM: 1. Severe sigmoid colonic diverticulosis with mild changes of  diverticulitis. No evidence of bowel obstruction or perforation.  2. Hysterectomy. Bilateral stable simple renal cysts. No other  significant focal abnormality identified.   Recent hemocult negative for blood. Urine culture negative. CBC     Her last colonoscopy was in 2010 by Dr. Jonette Eva for small four small polyps removed from her ascending colon.  She had frequent sigmoid diverticula. She also had internal hemorrhoids. Biopsy:   1. - TUBULAR ADENOMA.(SIX FRAGMENTS) - POLYPOID FRAGMENTS OF BENIGN COLONIC MUCOSA WITH INTRAMUCOSAL LYMPHOID AGGREGATES. - NO HIGH GRADE DYSPLASIA OR MALIGNANCY IDENTIFIED.  EGD in May of 2012 DR. Fields becaue of persistent nausea, and the fact that she was on full dose aspirin. She had nonobstructive but nonciritical Schatzki ring linear erosions in antrum/prepyloric region. Biopsies revealed gastropathy with evidence of H. Pylori.   Family hx of colon cancer in two aunts in their 15s or father's side.  Review of Systems Current Outpatient  Prescriptions  Medication Sig Dispense Refill  . aspirin 81 MG tablet Take 81 mg by mouth daily.       . Calcium Carbonate-Vitamin D (CALCIUM-VITAMIN D3) 600-200 MG-UNIT TABS Take by mouth 1 day or 1 dose.       . Cholecalciferol (VITAMIN D PO) Take 1,000 Units by mouth.      . magnesium gluconate (MAGONATE) 500 MG tablet Take 500 mg by mouth 2 (two) times daily.        . mirtazapine (REMERON) 7.5 MG tablet Take 1 tablet (7.5 mg total) by mouth at bedtime.  90 tablet  1  . omeprazole (PRILOSEC) 40 MG capsule Take 40 mg by mouth 2 (two) times daily.      . pravastatin (PRAVACHOL) 20 MG tablet Take 1 tablet (20 mg total) by mouth daily.  90 tablet  3  . ramipril (ALTACE) 5 MG capsule TAKE 1 CAPSULE DAILY  90 capsule  0  . sertraline (ZOLOFT) 50 MG tablet Take 1 tablet (50 mg total) by mouth daily.  90 tablet  0   No current facility-administered medications for this visit.   Past Medical History  Diagnosis Date  . Diverticulosis     [place]  . Hyperlipemia   . HTN (hypertension)   . Functional constipation 2010 INTERMITTENT  . Blindness of right eye   . BMI between 19-24,adult 2010 145 LBS  . Schatzki's ring DEC 2010  . Diverticula of small intestine DEC 2010 TWO  . Right eye trauma 1949    loss of  vision resulted at age 89   Past Surgical History  Procedure Laterality Date  . Total abdominal hysterectomy w/ bilateral salpingoophorectomy  1985 FIBROIDS  . Eye surgery  RIGHT 2005  . Breast reduction surgery  1992 BIL  . Colonoscopy  DEC 2010    SIMPLE ADENOMAS (6-8), [place] TICS, SML IH  . Upper gastrointestinal endoscopy  DEC 2010    Bx-MILD GASTRITIS/DUODENITIS, DUODENAL DIVERTICULA, SCHATZKI'S RING  . Abdominal hysterectomy     No Known Allergies      Objective:   Physical Exam  Filed Vitals:   11/09/13 1452  BP: 108/70  Pulse: 80  Temp: 98.1 F (36.7 C)  Height: 5' 5.5" (1.664 m)  Weight: 135 lb 6.4 oz (61.417 kg)   Alert and oriented. Skin warm and dry. Oral mucosa is  moist.   . Sclera anicteric, conjunctivae is pink. Thyroid not enlarged. No cervical lymphadenopathy. Lungs clear. Heart regular rate and rhythm.  Abdomen is soft. Bowel sounds are positive. No hepatomegaly. No abdominal masses felt. Slight tendernes left lower quadrant.  No edema to lower extremities.       Assessment:    Diverticulitis. Resolved. Hx of tubular adenomas. Family hx of colon cancer. Needs surveillance colonoscopy    Plan:    Surveillance colonoscopy.   The risks and benefits such as perforation, bleeding, and infection were reviewed with the patient and is agreeable.

## 2013-11-09 NOTE — Patient Instructions (Signed)
Colonoscopy with Dr. Rehman. The risks and benefits such as perforation, bleeding, and infection were reviewed with the patient and is agreeable. 

## 2013-11-09 NOTE — Telephone Encounter (Signed)
Patient needs trilyte instructions 

## 2013-11-10 MED ORDER — PEG 3350-KCL-NA BICARB-NACL 420 G PO SOLR: 4000.0000 mL | Freq: Once | ORAL | Status: DC

## 2013-11-29 ENCOUNTER — Encounter (HOSPITAL_COMMUNITY): Payer: Self-pay

## 2013-12-04 ENCOUNTER — Other Ambulatory Visit: Payer: Self-pay | Admitting: Family Medicine

## 2013-12-08 ENCOUNTER — Encounter (HOSPITAL_COMMUNITY): Payer: Self-pay | Admitting: *Deleted

## 2013-12-08 ENCOUNTER — Encounter (HOSPITAL_COMMUNITY)
Admission: RE | Disposition: A | Discharge status: Home / Self Care | Payer: Self-pay | Source: Ambulatory Visit | Attending: Internal Medicine

## 2013-12-08 ENCOUNTER — Ambulatory Visit (HOSPITAL_COMMUNITY)
Admission: RE | Admit: 2013-12-08 | Discharge: 2013-12-08 | Disposition: A | Discharge status: Home / Self Care | Payer: Medicare Other | Source: Ambulatory Visit | Attending: Internal Medicine | Admitting: Internal Medicine

## 2013-12-08 DIAGNOSIS — D126 Benign neoplasm of colon, unspecified: Secondary | ICD-10-CM | POA: Insufficient documentation

## 2013-12-08 DIAGNOSIS — K5730 Diverticulosis of large intestine without perforation or abscess without bleeding: Secondary | ICD-10-CM | POA: Insufficient documentation

## 2013-12-08 DIAGNOSIS — Z860100 Personal history of colon polyps, unspecified: Secondary | ICD-10-CM | POA: Insufficient documentation

## 2013-12-08 DIAGNOSIS — Z8719 Personal history of other diseases of the digestive system: Secondary | ICD-10-CM

## 2013-12-08 DIAGNOSIS — K5792 Diverticulitis of intestine, part unspecified, without perforation or abscess without bleeding: Secondary | ICD-10-CM

## 2013-12-08 DIAGNOSIS — K644 Residual hemorrhoidal skin tags: Secondary | ICD-10-CM

## 2013-12-08 HISTORY — DX: Gastro-esophageal reflux disease without esophagitis: K21.9

## 2013-12-08 HISTORY — PX: COLONOSCOPY: SHX5424

## 2013-12-08 SURGERY — COLONOSCOPY
Anesthesia: Moderate Sedation

## 2013-12-08 MED ORDER — MEPERIDINE HCL 50 MG/ML IJ SOLN
INTRAMUSCULAR | Status: DC | PRN
  Administered 2013-12-08 (×2): 25 mg via INTRAVENOUS

## 2013-12-08 MED ORDER — MIDAZOLAM HCL 5 MG/5ML IJ SOLN
INTRAMUSCULAR | Status: AC
  Filled 2013-12-08: qty 10

## 2013-12-08 MED ORDER — MIDAZOLAM HCL 5 MG/5ML IJ SOLN
INTRAMUSCULAR | Status: DC | PRN
  Administered 2013-12-08 (×2): 1 mg via INTRAVENOUS
  Administered 2013-12-08: 2 mg via INTRAVENOUS
  Administered 2013-12-08: 1 mg via INTRAVENOUS

## 2013-12-08 MED ORDER — SODIUM CHLORIDE 0.9 % IV SOLN
INTRAVENOUS | Status: DC
  Administered 2013-12-08: 1000 mL via INTRAVENOUS

## 2013-12-08 MED ORDER — MEPERIDINE HCL 50 MG/ML IJ SOLN
INTRAMUSCULAR | Status: AC
  Filled 2013-12-08: qty 1

## 2013-12-08 MED ORDER — STERILE WATER FOR IRRIGATION IR SOLN
Status: DC | PRN
  Administered 2013-12-08: 14:00:00

## 2013-12-08 NOTE — Op Note (Signed)
COLONOSCOPY PROCEDURE REPORT  PATIENT:  Kelsey Nelson  MR#:  [id number] Birthdate:  August 28, 1934, 79 y.o., female Endoscopist:  Dr. Malissa Hippo, MD Referred By:  Dr. Syliva Overman, MD  Procedure Date: 12/08/2013  Procedure:   Colonoscopy  Indications:  Patient is 79 year old Caucasian female who has history of colonic adenomas who developed LLQ abdominal pain last month and noted to have changes of diverticulitis on CT and has responded to antibiotic therapy. She is undergoing colonoscopy both for diagnostic and surveillance purpose.  Informed Consent:  The procedure and risks were reviewed with the patient and informed consent was obtained.  Medications:  Demerol 50 mg IV Versed 5 mg IV  Description of procedure:  After a digital rectal exam was performed, that colonoscope was advanced from the anus through the rectum and colon to the area of the cecum, ileocecal valve and appendiceal orifice. The cecum was deeply intubated. These structures were well-seen and photographed for the record. From the level of the cecum and ileocecal valve, the scope was slowly and cautiously withdrawn. The mucosal surfaces were carefully surveyed utilizing scope tip to flexion to facilitate fold flattening as needed. The scope was pulled down into the rectum where a thorough exam including retroflexion was performed.  Findings:   Prep excellent. Small polyp ablated via cold biopsy from proximal transverse colon. Multiple diverticula noted at sigmoid colon. Normal rectal mucosa. Small hemorrhoids below the dentate line.   Therapeutic/Diagnostic Maneuvers Performed:  See above  Complications:  None  Cecal Withdrawal Time:  7 minutes  Impression:  Examination performed to cecum. Small polyps ablated via cold biopsy from proximal transverse colon. Multiple diverticula at sigmoid colon. Small external hemorrhoids.  Recommendations:  Standard instructions given. High-fiber diet. Fiber  supplement 3-4 g by mouth daily. I will contact patient with biopsy results and further recommendations.  [place] U  12/08/2013 2:56 PM  CC: Dr. Syliva Overman, MD & Dr. Bonnetta Barry ref. provider found

## 2013-12-08 NOTE — H&P (Addendum)
Kelsey Nelson is an 79 y.o. female.   Chief Complaint: Patient is  here for colonoscopy. HPI: Patient is 79 year old Caucasian female who has history of colonic adenomas. She was recently treated for diverticulitis and responded to antibiotic therapy. She has history of constipation but lately her bowels moving regularly. She denies rectal bleeding. Family history significant for colon carcinoma in 2 paternal aunts she does not do their ages at the time of diagnosis. Patient's last colonoscopy was in December 2012 with removal of tubular adenoma from ascending colon.  Past Medical History  Diagnosis Date  . Diverticulosis     [place]  . Hyperlipemia   . HTN (hypertension)   . Functional constipation 2010 INTERMITTENT  . Blindness of right eye   . BMI between 19-24,adult 2010 145 LBS  . Schatzki's ring DEC 2010  . Diverticula of small intestine DEC 2010 TWO  . Right eye trauma 1949    loss of vision resulted at age 22  . GERD (gastroesophageal reflux disease)     Past Surgical History  Procedure Laterality Date  . Total abdominal hysterectomy w/ bilateral salpingoophorectomy  1985 FIBROIDS  . Eye surgery  RIGHT 2005  . Breast reduction surgery  1992 BIL  . Colonoscopy  DEC 2010    SIMPLE ADENOMAS (6-8), [place] TICS, SML IH  . Upper gastrointestinal endoscopy  DEC 2010    Bx-MILD GASTRITIS/DUODENITIS, DUODENAL DIVERTICULA, SCHATZKI'S RING  . Abdominal hysterectomy      Family History  Problem Relation Age of Onset  . Pancreatic cancer Sister   . Colon cancer Paternal Aunt   . Colon polyps Neg Hx    Social History:  reports that she has never smoked. She has never used smokeless tobacco. She reports that she does not drink alcohol or use illicit drugs.  Allergies: No Known Allergies  Medications Prior to Admission  Medication Sig Dispense Refill  . aspirin 81 MG tablet Take 81 mg by mouth daily.       . Calcium Carbonate-Vitamin D (CALCIUM-VITAMIN D3) 600-200 MG-UNIT TABS Take 1  tablet by mouth daily.       . cholecalciferol (VITAMIN D) 1000 UNITS tablet Take 1,000 Units by mouth daily.      . magnesium gluconate (MAGONATE) 500 MG tablet Take 500 mg by mouth daily.       . mirtazapine (REMERON) 7.5 MG tablet Take 1 tablet (7.5 mg total) by mouth at bedtime.  90 tablet  1  . omeprazole (PRILOSEC) 40 MG capsule Take 40 mg by mouth daily.      . pravastatin (PRAVACHOL) 20 MG tablet Take 20 mg by mouth daily.      . ramipril (ALTACE) 5 MG capsule TAKE 1 CAPSULE DAILY  90 capsule  0  . sertraline (ZOLOFT) 50 MG tablet Take 1 tablet (50 mg total) by mouth daily.  90 tablet  0    No results found for this or any previous visit (from the past 48 hour(s)). No results found.  ROS  Blood pressure 139/86, pulse 89, temperature 98.2 F (36.8 C), temperature source Oral, resp. rate 16, height 5' 5.5" (1.664 m), weight 135 lb (61.236 kg), SpO2 96.00%. Physical Exam  Constitutional: She appears well-developed and well-nourished.  HENT:  Mouth/Throat: Oropharynx is clear and moist.  Eyes: Conjunctivae are normal. No scleral icterus.  Neck: No thyromegaly present.  Cardiovascular: Normal rate, regular rhythm and normal heart sounds.   No murmur heard. Respiratory: Effort normal and breath sounds normal.  GI:  Soft. She exhibits no distension and no mass. Tenderness: mild tenderness in LLQ.  Musculoskeletal: She exhibits no edema.  Lymphadenopathy:    She has no cervical adenopathy.  Neurological: She is alert.  Skin: Skin is warm and dry.     Assessment/Plan History of colonic adenomas. History of diverticulitis. Diagnostic/surveillance colonoscopy.   REHMAN,NAJEEB U 12/08/2013, 2:15 PM

## 2013-12-13 ENCOUNTER — Encounter (HOSPITAL_COMMUNITY): Payer: Self-pay | Admitting: Internal Medicine

## 2013-12-24 ENCOUNTER — Encounter (INDEPENDENT_AMBULATORY_CARE_PROVIDER_SITE_OTHER): Payer: Self-pay

## 2013-12-24 ENCOUNTER — Ambulatory Visit (INDEPENDENT_AMBULATORY_CARE_PROVIDER_SITE_OTHER): Payer: Medicare Other | Admitting: Family Medicine

## 2013-12-24 ENCOUNTER — Encounter: Payer: Self-pay | Admitting: Family Medicine

## 2013-12-24 ENCOUNTER — Other Ambulatory Visit: Payer: Self-pay | Admitting: Family Medicine

## 2013-12-24 VITALS — BP 130/80 | HR 92 | Resp 16 | Ht 65.0 in | Wt 135.1 lb

## 2013-12-24 DIAGNOSIS — Z139 Encounter for screening, unspecified: Secondary | ICD-10-CM

## 2013-12-24 DIAGNOSIS — F329 Major depressive disorder, single episode, unspecified: Secondary | ICD-10-CM

## 2013-12-24 DIAGNOSIS — F3289 Other specified depressive episodes: Secondary | ICD-10-CM

## 2013-12-24 DIAGNOSIS — I10 Essential (primary) hypertension: Secondary | ICD-10-CM

## 2013-12-24 DIAGNOSIS — F32A Depression, unspecified: Secondary | ICD-10-CM

## 2013-12-24 DIAGNOSIS — M810 Age-related osteoporosis without current pathological fracture: Secondary | ICD-10-CM

## 2013-12-24 DIAGNOSIS — E785 Hyperlipidemia, unspecified: Secondary | ICD-10-CM

## 2013-12-24 DIAGNOSIS — G4700 Insomnia, unspecified: Secondary | ICD-10-CM

## 2013-12-24 DIAGNOSIS — Z13820 Encounter for screening for osteoporosis: Secondary | ICD-10-CM

## 2013-12-24 DIAGNOSIS — R110 Nausea: Secondary | ICD-10-CM

## 2013-12-24 LAB — COMPREHENSIVE METABOLIC PANEL
ALK PHOS: 143 U/L — AB (ref 39–117)
ALT: 21 U/L (ref 0–35)
AST: 27 U/L (ref 0–37)
Albumin: 4.1 g/dL (ref 3.5–5.2)
BUN: 16 mg/dL (ref 6–23)
CALCIUM: 9.4 mg/dL (ref 8.4–10.5)
CO2: 28 mEq/L (ref 19–32)
CREATININE: 0.91 mg/dL (ref 0.50–1.10)
Chloride: 102 mEq/L (ref 96–112)
Glucose, Bld: 100 mg/dL — ABNORMAL HIGH (ref 70–99)
Potassium: 4.1 mEq/L (ref 3.5–5.3)
Sodium: 138 mEq/L (ref 135–145)
Total Bilirubin: 0.4 mg/dL (ref 0.3–1.2)
Total Protein: 6.8 g/dL (ref 6.0–8.3)

## 2013-12-24 LAB — LIPID PANEL
CHOL/HDL RATIO: 2.7 ratio
Cholesterol: 247 mg/dL — ABNORMAL HIGH (ref 0–200)
HDL: 92 mg/dL (ref >39)
LDL CALC: 131 mg/dL — AB (ref 0–99)
Triglycerides: 121 mg/dL (ref <150)
VLDL: 24 mg/dL (ref 0–40)

## 2013-12-24 MED ORDER — RAMIPRIL 5 MG PO CAPS: ORAL_CAPSULE | ORAL | Status: DC

## 2013-12-24 MED ORDER — SERTRALINE HCL 50 MG PO TABS: 50.0000 mg | ORAL_TABLET | Freq: Every day | ORAL | Status: DC

## 2013-12-24 MED ORDER — MIRTAZAPINE 7.5 MG PO TABS: 7.5000 mg | ORAL_TABLET | Freq: Every day | ORAL | Status: DC

## 2013-12-24 NOTE — Progress Notes (Signed)
   Subjective:    Patient ID: Kelsey Nelson, female    DOB: 11/28/1934, 80 y.o.   MRN: [id number]  HPI The PT is here for follow up and re-evaluation of chronic medical conditions, medication management and review of any available recent lab and radiology data.  Preventive health is updated, specifically  Cancer screening and Immunization.   Questions or concerns regarding consultations or procedures which the PT has had in the interim are  Addressed.Recent GI eval was negative The PT denies any adverse reactions to current medications since the last visit. States remeron has been a "wonder drug" for her , with complete symptom resolution, denies nausea, poor appetite or poor sleep. Feels "better than she has in years' There are no new concerns.  There are no specific complaints       Review of Systems See HPI Denies recent fever or chills. Denies sinus pressure, nasal congestion, ear pain or sore throat. Denies chest congestion, productive cough or wheezing. Denies chest pains, palpitations and leg swelling Denies abdominal pain, nausea, vomiting,diarrhea or constipation.   Denies dysuria, frequency, hesitancy or incontinence. Denies joint pain, swelling and limitation in mobility. Denies headaches, seizures, numbness, or tingling. Denies depression, anxiety or insomnia. Denies skin break down or rash.        Objective:   Physical Exam  Patient alert and oriented and in no cardiopulmonary distress.  HEENT: No facial asymmetry, EOMI, no sinus tenderness,  oropharynx pink and moist.  Neck supple no adenopathy.  Chest: Clear to auscultation bilaterally.  CVS: S1, S2 no murmurs, no S3.  ABD: Soft non tender. Bowel sounds normal.  Ext: No edema  MS: Adequate ROM spine, shoulders, hips and knees.  Skin: Intact, no ulcerations or rash noted.  Psych: Good eye contact, normal affect. Memory intact not anxious or depressed appearing.  CNS: CN 2-12 intact, power, tone and  sensation normal throughout.       Assessment & Plan:

## 2013-12-24 NOTE — Patient Instructions (Addendum)
Pelvic and breast July 15 or after, call if you need me before  I am truly thankful that you are doing so much better.  You are referred for a bone density scan    When you finish the magnesium, better to take one multivitamin, like centrum , once daily  Fasting CBc, lipid, cmp , TSd and Vit D in July, just before next visit  Keep active!

## 2013-12-25 NOTE — Assessment & Plan Note (Signed)
Dexa is past due , pt reminded of need for daily calcium and is referred for dexa

## 2013-12-25 NOTE — Assessment & Plan Note (Signed)
Deteriorated and uncontrolled, I recommend tha tpt resume pravachol , low dose, also follow low fat dit. She will be contacted next week as lab was drawn the day of the visit

## 2013-12-25 NOTE — Assessment & Plan Note (Signed)
Controlled, no change in medication  

## 2013-12-25 NOTE — Assessment & Plan Note (Signed)
Resolved , continue remeron, sleep hygiene also reviewed

## 2013-12-25 NOTE — Assessment & Plan Note (Signed)
Denies any synmptoms of depression, has good appetie and sleep well, continue zoloft and remeron

## 2013-12-25 NOTE — Assessment & Plan Note (Signed)
Resolved, continue remeron

## 2013-12-27 ENCOUNTER — Ambulatory Visit (HOSPITAL_COMMUNITY)
Admission: RE | Admit: 2013-12-27 | Discharge: 2013-12-27 | Disposition: A | Discharge status: Home / Self Care | Payer: Medicare Other | Source: Ambulatory Visit | Attending: Family Medicine | Admitting: Family Medicine

## 2013-12-27 DIAGNOSIS — Z13820 Encounter for screening for osteoporosis: Secondary | ICD-10-CM

## 2013-12-27 DIAGNOSIS — Z780 Asymptomatic menopausal state: Secondary | ICD-10-CM | POA: Insufficient documentation

## 2013-12-27 DIAGNOSIS — M949 Disorder of cartilage, unspecified: Principal | ICD-10-CM

## 2013-12-27 DIAGNOSIS — M899 Disorder of bone, unspecified: Secondary | ICD-10-CM | POA: Insufficient documentation

## 2013-12-27 DIAGNOSIS — Z9079 Acquired absence of other genital organ(s): Secondary | ICD-10-CM | POA: Insufficient documentation

## 2013-12-28 ENCOUNTER — Other Ambulatory Visit: Payer: Self-pay | Admitting: Family Medicine

## 2013-12-31 ENCOUNTER — Other Ambulatory Visit: Payer: Self-pay

## 2013-12-31 ENCOUNTER — Other Ambulatory Visit: Payer: Self-pay | Admitting: Family Medicine

## 2013-12-31 ENCOUNTER — Ambulatory Visit (INDEPENDENT_AMBULATORY_CARE_PROVIDER_SITE_OTHER): Payer: Medicare Other | Admitting: Urology

## 2013-12-31 DIAGNOSIS — R3915 Urgency of urination: Secondary | ICD-10-CM

## 2013-12-31 MED ORDER — RISEDRONATE SODIUM 35 MG PO TABS: 35.0000 mg | ORAL_TABLET | ORAL | Status: DC

## 2014-01-28 ENCOUNTER — Other Ambulatory Visit: Payer: Self-pay

## 2014-01-28 MED ORDER — OMEPRAZOLE 20 MG PO CPDR: 20.0000 mg | DELAYED_RELEASE_CAPSULE | Freq: Every day | ORAL | Status: DC

## 2014-03-16 ENCOUNTER — Other Ambulatory Visit: Payer: Self-pay | Admitting: Family Medicine

## 2014-04-13 ENCOUNTER — Encounter (HOSPITAL_COMMUNITY): Payer: Self-pay | Admitting: Emergency Medicine

## 2014-04-13 ENCOUNTER — Emergency Department (HOSPITAL_COMMUNITY)
Admission: EM | Admit: 2014-04-13 | Discharge: 2014-04-13 | Disposition: A | Discharge status: Home / Self Care | Payer: Medicare Other | Attending: Emergency Medicine | Admitting: Emergency Medicine

## 2014-04-13 DIAGNOSIS — H1130 Conjunctival hemorrhage, unspecified eye: Secondary | ICD-10-CM | POA: Insufficient documentation

## 2014-04-13 DIAGNOSIS — S0502XA Injury of conjunctiva and corneal abrasion without foreign body, left eye, initial encounter: Secondary | ICD-10-CM

## 2014-04-13 DIAGNOSIS — K219 Gastro-esophageal reflux disease without esophagitis: Secondary | ICD-10-CM | POA: Insufficient documentation

## 2014-04-13 DIAGNOSIS — S058X9A Other injuries of unspecified eye and orbit, initial encounter: Secondary | ICD-10-CM | POA: Insufficient documentation

## 2014-04-13 DIAGNOSIS — H5440 Blindness, one eye, unspecified eye: Secondary | ICD-10-CM | POA: Insufficient documentation

## 2014-04-13 DIAGNOSIS — Z7982 Long term (current) use of aspirin: Secondary | ICD-10-CM | POA: Insufficient documentation

## 2014-04-13 DIAGNOSIS — I10 Essential (primary) hypertension: Secondary | ICD-10-CM | POA: Insufficient documentation

## 2014-04-13 DIAGNOSIS — E785 Hyperlipidemia, unspecified: Secondary | ICD-10-CM | POA: Insufficient documentation

## 2014-04-13 DIAGNOSIS — Y929 Unspecified place or not applicable: Secondary | ICD-10-CM | POA: Insufficient documentation

## 2014-04-13 DIAGNOSIS — Z79899 Other long term (current) drug therapy: Secondary | ICD-10-CM | POA: Insufficient documentation

## 2014-04-13 DIAGNOSIS — X58XXXA Exposure to other specified factors, initial encounter: Secondary | ICD-10-CM | POA: Insufficient documentation

## 2014-04-13 DIAGNOSIS — Y939 Activity, unspecified: Secondary | ICD-10-CM | POA: Insufficient documentation

## 2014-04-13 DIAGNOSIS — Z9889 Other specified postprocedural states: Secondary | ICD-10-CM | POA: Insufficient documentation

## 2014-04-13 DIAGNOSIS — H1132 Conjunctival hemorrhage, left eye: Secondary | ICD-10-CM

## 2014-04-13 MED ORDER — TOBRAMYCIN 0.3 % OP SOLN
1.0000 [drp] | Freq: Once | OPHTHALMIC | Status: AC
  Administered 2014-04-13: 1 [drp] via OPHTHALMIC
  Filled 2014-04-13: qty 5

## 2014-04-13 MED ORDER — TETRACAINE HCL 0.5 % OP SOLN
2.0000 [drp] | Freq: Once | OPHTHALMIC | Status: AC
  Administered 2014-04-13: 2 [drp] via OPHTHALMIC
  Filled 2014-04-13: qty 2

## 2014-04-13 MED ORDER — KETOROLAC TROMETHAMINE 0.5 % OP SOLN
1.0000 [drp] | Freq: Once | OPHTHALMIC | Status: AC
  Administered 2014-04-13: 1 [drp] via OPHTHALMIC
  Filled 2014-04-13: qty 5

## 2014-04-13 MED ORDER — FLUORESCEIN SODIUM 1 MG OP STRP
1.0000 | ORAL_STRIP | Freq: Once | OPHTHALMIC | Status: AC
  Administered 2014-04-13: 1 via OPHTHALMIC
  Filled 2014-04-13: qty 1

## 2014-04-13 NOTE — Discharge Instructions (Signed)
Corneal Abrasion  The cornea is the clear covering at the front and center of the eye. When looking at the colored portion of the eye (iris), you are looking through the cornea. This very thin tissue is made up of many layers. The surface layer is a single layer of cells (corneal epithelium) and is one of the most sensitive tissues in the body. If a scratch or injury causes the corneal epithelium to come off, it is called a corneal abrasion. If the injury extends to the tissues below the epithelium, the condition is called a corneal ulcer.  CAUSES    Scratches.   Trauma.   Foreign body in the eye.  Some people have recurrences of abrasions in the area of the original injury even after it has healed (recurrent erosion syndrome). Recurrent erosion syndrome generally improves and goes away with time.  SYMPTOMS    Eye pain.   Difficulty or inability to keep the injured eye open.   The eye becomes very sensitive to light.   Recurrent erosions tend to happen suddenly, first thing in the morning, usually after waking up and opening the eye.  DIAGNOSIS   Your health care provider can diagnose a corneal abrasion during an eye exam. Dye is usually placed in the eye using a drop or a small paper strip moistened by your tears. When the eye is examined with a special light, the abrasion shows up clearly because of the dye.  TREATMENT    Small abrasions may be treated with antibiotic drops or ointment alone.   Usually a pressure patch is specially applied. Pressure patches prevent the eye from blinking, allowing the corneal epithelium to heal. A pressure patch also reduces the amount of pain present in the eye during healing. Most corneal abrasions heal within 2 3 days with no effect on vision.  If the abrasion becomes infected and spreads to the deeper tissues of the cornea, a corneal ulcer can result. This is serious because it can cause corneal scarring. Corneal scars interfere with light passing through the cornea  and cause a loss of vision in the involved eye.  HOME CARE INSTRUCTIONS   Use medicine or ointment as directed. Only take over-the-counter or prescription medicines for pain, discomfort, or fever as directed by your health care provider.   Do not drive or operate machinery while your eye is patched. Your ability to judge distances is impaired.   If your health care provider has given you a follow-up appointment, it is very important to keep that appointment. Not keeping the appointment could result in a severe eye infection or permanent loss of vision. If there is any problem keeping the appointment, let your health care provider know.  SEEK MEDICAL CARE IF:    You have pain, light sensitivity, and a scratchy feeling in one eye or both eyes.   Your pressure patch keeps loosening up, and you can blink your eye under the patch after treatment.   Any kind of discharge develops from the eye after treatment or if the lids stick together in the morning.   You have the same symptoms in the morning as you did with the original abrasion days, weeks, or months after the abrasion healed.  MAKE SURE YOU:    Understand these instructions.   Will watch your condition.   Will get help right away if you are not doing well or get worse.  Document Released: 12/06/2000 Document Revised: 09/29/2013 Document Reviewed: 08/16/2013  [employer] Patient Information   2014 [employer]

## 2014-04-13 NOTE — ED Notes (Signed)
Pt received discharge instructions and prescriptions, verbalized understanding and has no further questions. Pt ambulated to exit in stable condition accompanied by husband.

## 2014-04-13 NOTE — ED Notes (Signed)
Pt presents with pain and redness to left eye. Pt states she first noticed symptoms approximately one hour ago. Pt denies blurred vision.

## 2014-04-16 NOTE — ED Provider Notes (Signed)
CSN: [id number]     Arrival date & time 04/13/14  1747 History   First MD Initiated Contact with Patient 04/13/14 1848     Chief Complaint  Patient presents with  . Eye Pain     (Consider location/radiation/quality/duration/timing/severity/associated sxs/prior Treatment) Patient is a 80 y.o. female presenting with eye pain. The history is provided by the patient.  Eye Pain This is a new problem. Episode onset: one hour PTA. The problem occurs constantly. The problem has been unchanged. Pertinent negatives include no chest pain, chills, congestion, coughing, fever, headaches, nausea, neck pain, numbness, rash, sore throat, swollen glands, vertigo, visual change, vomiting or weakness. Associated symptoms comments: foreign body sensation to her eye after being outside in the wind.  . Exacerbated by: bright light and blinking. She has tried nothing for the symptoms. The treatment provided no relief.    Past Medical History  Diagnosis Date  . Diverticulosis     [place]  . Hyperlipemia   . HTN (hypertension)   . Functional constipation 2010 INTERMITTENT  . Blindness of right eye   . BMI between 19-24,adult 2010 145 LBS  . Schatzki's ring DEC 2010  . Diverticula of small intestine DEC 2010 TWO  . Right eye trauma 1949    loss of vision resulted at age 25  . GERD (gastroesophageal reflux disease)    Past Surgical History  Procedure Laterality Date  . Total abdominal hysterectomy w/ bilateral salpingoophorectomy  1985 FIBROIDS  . Eye surgery  RIGHT 2005  . Breast reduction surgery  1992 BIL  . Colonoscopy  DEC 2010    SIMPLE ADENOMAS (6-8), [place] TICS, SML IH  . Upper gastrointestinal endoscopy  DEC 2010    Bx-MILD GASTRITIS/DUODENITIS, DUODENAL DIVERTICULA, SCHATZKI'S RING  . Abdominal hysterectomy    . Colonoscopy N/A 12/08/2013    Procedure: COLONOSCOPY;  Surgeon: Rogene Houston, MD;  Location: [facility];  Service: Endoscopy;  Laterality: N/A;  225   Family History  Problem  Relation Age of Onset  . Pancreatic cancer Sister   . Colon cancer Paternal Aunt   . Colon polyps Neg Hx    History  Substance Use Topics  . Smoking status: Never Smoker   . Smokeless tobacco: Never Used  . Alcohol Use: No   OB History   Grav Para Term Preterm Abortions TAB SAB Ect Mult Living                 Review of Systems  Constitutional: Negative for fever and chills.  HENT: Negative for congestion, ear pain, facial swelling and sore throat.   Eyes: Positive for photophobia, pain and redness. Negative for discharge and visual disturbance.  Respiratory: Negative for cough and chest tightness.   Cardiovascular: Negative for chest pain.  Gastrointestinal: Negative for nausea and vomiting.  Musculoskeletal: Negative for neck pain.  Skin: Negative for rash.  Neurological: Negative for dizziness, vertigo, syncope, speech difficulty, weakness, numbness and headaches.  All other systems reviewed and are negative.     Allergies  Fosamax  Home Medications   Prior to Admission medications   Medication Sig Start Date End Date Taking? Authorizing Provider  aspirin 81 MG tablet Take 81 mg by mouth daily.     Historical Provider, MD  Calcium Carbonate-Vitamin D (CALCIUM-VITAMIN D3) 600-200 MG-UNIT TABS Take 1 tablet by mouth daily.     Historical Provider, MD  cholecalciferol (VITAMIN D) 1000 UNITS tablet Take 1,000 Units by mouth daily.    Historical Provider, MD  magnesium gluconate (MAGONATE) 500 MG tablet Take 500 mg by mouth daily.     Historical Provider, MD  mirtazapine (REMERON) 7.5 MG tablet Take 1 tablet (7.5 mg total) by mouth at bedtime. 12/24/13 12/24/14  Fayrene Helper, MD  omeprazole (PRILOSEC) 20 MG capsule Take 1 capsule (20 mg total) by mouth daily. 01/28/14   Fayrene Helper, MD  pravastatin (PRAVACHOL) 20 MG tablet Take 1 tablet (20 mg total) by mouth daily. 03/16/14   Fayrene Helper, MD  ramipril (ALTACE) 5 MG capsule TAKE 1 CAPSULE DAILY 12/24/13   Fayrene Helper, MD  risedronate (ACTONEL) 35 MG tablet Take 1 tablet (35 mg total) by mouth every 7 (seven) days. with water on empty stomach, nothing by mouth or lie down for next 30 minutes. 12/31/13   Fayrene Helper, MD  sertraline (ZOLOFT) 50 MG tablet Take 1 tablet (50 mg total) by mouth daily. 12/24/13 12/24/14  Fayrene Helper, MD   BP 157/71  Pulse 73  Temp(Src) 98 F (36.7 C)  Resp 18  SpO2 97% Physical Exam  Nursing note and vitals reviewed. Constitutional: She is oriented to person, place, and time. She appears well-developed and well-nourished. No distress.  HENT:  Head: Normocephalic and atraumatic.  Eyes: EOM are normal. Pupils are equal, round, and reactive to light. Lids are everted and swept, no foreign bodies found. Left eye exhibits no chemosis, no discharge, no exudate and no hordeolum. No foreign body present in the left eye. Right conjunctiva is not injected. Left conjunctiva is not injected. Left conjunctiva has a hemorrhage. Left eye exhibits normal extraocular motion.  Slit lamp exam:      The left eye shows corneal abrasion and fluorescein uptake. The left eye shows no corneal flare, no corneal ulcer, no foreign body, no hyphema, no hypopyon and no anterior chamber bulge.    Small corneal abrasion to the 2 o'clock position left eye.  Slit lamp exam reveals negative Seidel's sign, no hyphema or FB.  Pt reports hx blindness to right eye secondary to childhood trauma  Neck: Normal range of motion. Neck supple. No thyromegaly present.  Cardiovascular: Normal rate, regular rhythm, normal heart sounds and intact distal pulses.   No murmur heard. Pulmonary/Chest: Effort normal and breath sounds normal. No respiratory distress.  Musculoskeletal: Normal range of motion.  Lymphadenopathy:    She has no cervical adenopathy.  Neurological: She is alert and oriented to person, place, and time. She exhibits normal muscle tone. Coordination normal.  Skin: Skin is warm and dry. No  rash noted.    ED Course  Procedures (including critical care time) Labs Review Labs Reviewed - No data to display  Imaging Review No results found.   EKG Interpretation None      MDM   Final diagnoses:  Corneal abrasion, left  Subconjunctival hemorrhage of left eye     Small corneal abrasion to left eye.  No FB's, small subconjunctival hemorrhage also present.  No visual changes.    Visual Acuity - R Near: (pt reports baseline right eye blindness) ; R Distance: (see above commet) ; L Near: 20/25 ; L Distance: 20/25  Tobramycin and ketorolac drops dispensed .  patient is feeling better and appears stable for discharge.  She agrees to close f/u with her ophtho.       Kijuan Gallicchio L. Vanessa Brenas, PA-C 04/16/14 2022

## 2014-04-16 NOTE — ED Provider Notes (Signed)
Medical screening examination/treatment/procedure(s) were performed by non-physician practitioner and as supervising physician I was immediately available for consultation/collaboration.   EKG Interpretation None        Kelsey Nelson Starch, MD 04/16/14 2340

## 2014-05-22 ENCOUNTER — Other Ambulatory Visit: Payer: Self-pay | Admitting: Family Medicine

## 2014-05-26 ENCOUNTER — Other Ambulatory Visit: Payer: Self-pay | Admitting: Family Medicine

## 2014-06-02 ENCOUNTER — Other Ambulatory Visit: Payer: Self-pay | Admitting: Family Medicine

## 2014-07-08 ENCOUNTER — Other Ambulatory Visit: Payer: Self-pay | Admitting: Family Medicine

## 2014-07-08 LAB — LIPID PANEL
Cholesterol: 212 mg/dL — ABNORMAL HIGH (ref 0–200)
HDL: 93 mg/dL (ref >39)
LDL Cholesterol: 100 mg/dL — ABNORMAL HIGH (ref 0–99)
Total CHOL/HDL Ratio: 2.3 Ratio
Triglycerides: 94 mg/dL (ref <150)
VLDL: 19 mg/dL (ref 0–40)

## 2014-07-08 LAB — CBC WITH DIFFERENTIAL/PLATELET
BASOS ABS: 0 10*3/uL (ref 0.0–0.1)
Basophils Relative: 0 % (ref 0–1)
EOS PCT: 1 % (ref 0–5)
Eosinophils Absolute: 0.1 10*3/uL (ref 0.0–0.7)
HCT: 39 % (ref 36.0–46.0)
Hemoglobin: 13.2 g/dL (ref 12.0–15.0)
LYMPHS PCT: 26 % (ref 12–46)
Lymphs Abs: 1.3 10*3/uL (ref 0.7–4.0)
MCH: 30.5 pg (ref 26.0–34.0)
MCHC: 33.8 g/dL (ref 30.0–36.0)
MCV: 90.1 fL (ref 78.0–100.0)
Monocytes Absolute: 0.5 10*3/uL (ref 0.1–1.0)
Monocytes Relative: 9 % (ref 3–12)
NEUTROS PCT: 64 % (ref 43–77)
Neutro Abs: 3.3 10*3/uL (ref 1.7–7.7)
PLATELETS: 267 10*3/uL (ref 150–400)
RBC: 4.33 MIL/uL (ref 3.87–5.11)
RDW: 13.4 % (ref 11.5–15.5)
WBC: 5.1 10*3/uL (ref 4.0–10.5)

## 2014-07-08 LAB — COMPREHENSIVE METABOLIC PANEL
ALT: 20 U/L (ref 0–35)
AST: 25 U/L (ref 0–37)
Albumin: 4 g/dL (ref 3.5–5.2)
Alkaline Phosphatase: 103 U/L (ref 39–117)
BUN: 14 mg/dL (ref 6–23)
CALCIUM: 9.2 mg/dL (ref 8.4–10.5)
CHLORIDE: 102 meq/L (ref 96–112)
CO2: 28 mEq/L (ref 19–32)
CREATININE: 0.92 mg/dL (ref 0.50–1.10)
Glucose, Bld: 81 mg/dL (ref 70–99)
Potassium: 4.3 mEq/L (ref 3.5–5.3)
SODIUM: 138 meq/L (ref 135–145)
Total Bilirubin: 0.5 mg/dL (ref 0.2–1.2)
Total Protein: 6.7 g/dL (ref 6.0–8.3)

## 2014-07-08 LAB — TSH: TSH: 2.766 u[IU]/mL (ref 0.350–4.500)

## 2014-07-08 LAB — VITAMIN D 25 HYDROXY (VIT D DEFICIENCY, FRACTURES): Vit D, 25-Hydroxy: 78 ng/mL (ref 30–89)

## 2014-07-11 ENCOUNTER — Ambulatory Visit (INDEPENDENT_AMBULATORY_CARE_PROVIDER_SITE_OTHER): Payer: Medicare Other | Admitting: Family Medicine

## 2014-07-11 ENCOUNTER — Other Ambulatory Visit (HOSPITAL_COMMUNITY)
Admission: RE | Admit: 2014-07-11 | Discharge: 2014-07-11 | Disposition: A | Discharge status: Home / Self Care | Payer: Medicare Other | Source: Ambulatory Visit | Attending: Family Medicine | Admitting: Family Medicine

## 2014-07-11 ENCOUNTER — Encounter: Payer: Self-pay | Admitting: Family Medicine

## 2014-07-11 VITALS — BP 140/84 | HR 70 | Resp 16 | Ht 65.0 in | Wt 136.1 lb

## 2014-07-11 DIAGNOSIS — Z124 Encounter for screening for malignant neoplasm of cervix: Secondary | ICD-10-CM | POA: Diagnosis present

## 2014-07-11 DIAGNOSIS — Z1211 Encounter for screening for malignant neoplasm of colon: Secondary | ICD-10-CM

## 2014-07-11 DIAGNOSIS — I10 Essential (primary) hypertension: Secondary | ICD-10-CM

## 2014-07-11 DIAGNOSIS — Z0000 Encounter for general adult medical examination without abnormal findings: Secondary | ICD-10-CM

## 2014-07-11 DIAGNOSIS — Z23 Encounter for immunization: Secondary | ICD-10-CM

## 2014-07-11 DIAGNOSIS — E785 Hyperlipidemia, unspecified: Secondary | ICD-10-CM

## 2014-07-11 LAB — POC HEMOCCULT BLD/STL (OFFICE/1-CARD/DIAGNOSTIC): Fecal Occult Blood, POC: NEGATIVE

## 2014-07-11 NOTE — Progress Notes (Signed)
   Subjective:    Patient ID: Kelsey Nelson, female    DOB: [birth year], 80 y.o.   MRN: [id number]  HPI Patient is in for annual physical exam. No other health concerns are expressed or addressed at the visit. Recent labs are reviewed, no changes needed in medications Pt states she feels the best that she has in over 3 years    Review of Systems See HPI     Objective:   Physical Exam BP 140/84  Pulse 70  Resp 16  Ht 5\' 5"  (1.651 m)  Wt 136 lb 1.9 oz (61.744 kg)  BMI 22.65 kg/m2  SpO2 96% Pleasant well nourished female, alert and oriented x 3, in no cardio-pulmonary distress. Afebrile. HEENT No facial trauma or asymetry. Sinuses non tender.  EOMI, .  External ears normal, tympanic membranes clear. Oropharynx moist, no exudate, fair dentition. Neck: supple, no adenopathy,JVD or thyromegaly.No bruits.  Chest: Clear to ascultation bilaterally.No crackles or wheezes. Non tender to palpation  Breast: No asymetry,no masses or lumps. No tenderness. No nipple discharge or inversion. No axillary or supraclavicular adenopathy Bilateral scars from breast reduction surgery  Cardiovascular system; Heart sounds normal,  S1 and  S2 ,no S3.  No murmur, or thrill. Apical beat not displaced Peripheral pulses normal.  Abdomen: Soft, non tender, no organomegaly or masses. No bruits. Bowel sounds normal. No guarding, tenderness or rebound.  Rectal:  Normal sphincter tone. No mass.No rectal masses.  Guaiac negative stool.  GU: External genitalia normal female genitalia , female distribution of hair. No lesions. Urethral meatus normal in size, no  Prolapse, no lesions visibly  Present. Bladder non tender. Vagina pink and moist , with no visible lesions , physiologic discharge present . Adequate pelvic support no  cystocele or rectocele noted  Uterus absent, no adnexal masses, no adnexal tenderness.   Musculoskeletal exam: Full ROM of spine, hips , shoulders and knees. No  deformity ,swelling or crepitus noted. No muscle wasting or atrophy.   Neurologic: Cranial nerves 3 to 12 intact. Legally .Blind in right eye Power, tone ,sensation and reflexes normal throughout. No disturbance in gait. No tremor.  Skin: Intact, no ulceration, erythema , scaling or rash noted. Pigmentation normal throughout  Psych; Normal mood and affect. Judgement and concentration normal        Assessment & Plan:  Encounter for annual health examination Annual exam as documented. Counseling done  re healthy lifestyle involving commitment to 150 minutes exercise per week, heart healthy diet, and attaining healthy weight.The importance of adequate sleep also discussed. Regular seat belt use , is also discussed. Changes in health habits are decided on by the patient with goals and time frames  set for achieving them. Immunization and cancer screening needs are specifically addressed at this visit.   Need for vaccination with 13-polyvalent pneumococcal conjugate vaccine vaccine administered at visit

## 2014-07-11 NOTE — Patient Instructions (Addendum)
F/u in mid December, call if  you need me before  Prevnar today  Mammogram to be scheduled for  Oct 31 , or after , when it is due   Pls reduce salt intake and fatty foods , like chips, BP slightly high and also your cholesterol , though mUCH better is still a bit higher than it should be  Fasting lipid, cmp and cmp in December

## 2014-07-13 LAB — CYTOLOGY - PAP

## 2014-07-17 ENCOUNTER — Encounter: Payer: Self-pay | Admitting: Family Medicine

## 2014-07-17 DIAGNOSIS — Z23 Encounter for immunization: Secondary | ICD-10-CM | POA: Insufficient documentation

## 2014-07-17 DIAGNOSIS — Z0000 Encounter for general adult medical examination without abnormal findings: Secondary | ICD-10-CM | POA: Insufficient documentation

## 2014-07-17 NOTE — Assessment & Plan Note (Signed)
Annual exam as documented. Counseling done  re healthy lifestyle involving commitment to 150 minutes exercise per week, heart healthy diet, and attaining healthy weight.The importance of adequate sleep also discussed. Regular seat belt use , is also discussed. Changes in health habits are decided on by the patient with goals and time frames  set for achieving them. Immunization and cancer screening needs are specifically addressed at this visit.

## 2014-07-17 NOTE — Assessment & Plan Note (Signed)
vaccine administered at visit

## 2014-07-28 ENCOUNTER — Other Ambulatory Visit: Payer: Self-pay | Admitting: Family Medicine

## 2014-08-20 ENCOUNTER — Other Ambulatory Visit: Payer: Self-pay | Admitting: Family Medicine

## 2014-08-28 ENCOUNTER — Other Ambulatory Visit: Payer: Self-pay | Admitting: Family Medicine

## 2014-09-09 ENCOUNTER — Other Ambulatory Visit: Payer: Self-pay | Admitting: Dermatology

## 2014-10-13 ENCOUNTER — Other Ambulatory Visit: Payer: Self-pay | Admitting: Family Medicine

## 2014-11-16 ENCOUNTER — Telehealth: Payer: Self-pay | Admitting: Family Medicine

## 2014-11-18 ENCOUNTER — Other Ambulatory Visit: Payer: Self-pay | Admitting: Family Medicine

## 2014-11-19 NOTE — Telephone Encounter (Signed)
I spoke directly with Dr Iona Hansen, he is concerned about unilateral proptosis in pt, will need brain scan with focus on orbits, also concerned about thyroid function

## 2014-11-21 NOTE — Telephone Encounter (Signed)
WILL GET BEST SCAN TO ORDER FORM RADIOLOGY AFTER SPEAKING WITH PT

## 2014-11-22 ENCOUNTER — Telehealth: Payer: Self-pay | Admitting: *Deleted

## 2014-11-22 NOTE — Telephone Encounter (Signed)
Pt called [facility] stating Dr. Moshe Cipro called her yesterday and told her she would call back later, pt said she missed her call. Pt said she will be home for most of the day and would like for Dr. Moshe Cipro to call her back, pt said Dr. Moshe Cipro needs to call her back. Please advise

## 2014-11-22 NOTE — Telephone Encounter (Signed)
Spoke with pt directly she is aware that her thyroid bkllood test is normal, states right eye hich she has no vision in has always builged slightly, , developed  Pain and redness in past 2 weeks i Will order scan per radiology recs

## 2014-11-22 NOTE — Telephone Encounter (Signed)
Will call.

## 2014-11-24 ENCOUNTER — Other Ambulatory Visit: Payer: Self-pay | Admitting: Family Medicine

## 2014-11-24 ENCOUNTER — Telehealth: Payer: Self-pay | Admitting: Family Medicine

## 2014-11-24 DIAGNOSIS — H5711 Ocular pain, right eye: Secondary | ICD-10-CM

## 2014-11-24 DIAGNOSIS — H0520 Unspecified exophthalmos: Secondary | ICD-10-CM

## 2014-11-24 NOTE — Telephone Encounter (Signed)
Doesn't want anything for MRI

## 2014-11-24 NOTE — Telephone Encounter (Signed)
Pls find out from pt if she expects to need valium/calming agent prior to MRI which we are trying to schedule , if so send me a msg I will order

## 2014-11-24 NOTE — Telephone Encounter (Signed)
noted 

## 2014-11-30 ENCOUNTER — Ambulatory Visit (HOSPITAL_COMMUNITY)
Admission: RE | Admit: 2014-11-30 | Discharge: 2014-11-30 | Disposition: A | Discharge status: Home / Self Care | Payer: Medicare Other | Source: Ambulatory Visit | Attending: Family Medicine | Admitting: Family Medicine

## 2014-11-30 DIAGNOSIS — H5711 Ocular pain, right eye: Secondary | ICD-10-CM

## 2014-11-30 DIAGNOSIS — H0520 Unspecified exophthalmos: Secondary | ICD-10-CM

## 2014-11-30 DIAGNOSIS — H578 Other specified disorders of eye and adnexa: Secondary | ICD-10-CM | POA: Diagnosis present

## 2014-11-30 LAB — POCT I-STAT CREATININE: Creatinine, Ser: 1 mg/dL (ref 0.50–1.10)

## 2014-11-30 MED ORDER — GADOBENATE DIMEGLUMINE 529 MG/ML IV SOLN
12.0000 mL | Freq: Once | INTRAVENOUS | Status: AC | PRN
  Administered 2014-11-30: 12 mL via INTRAVENOUS

## 2014-12-02 LAB — CBC
HEMATOCRIT: 40.2 % (ref 36.0–46.0)
HEMOGLOBIN: 13.5 g/dL (ref 12.0–15.0)
MCH: 30.7 pg (ref 26.0–34.0)
MCHC: 33.6 g/dL (ref 30.0–36.0)
MCV: 91.4 fL (ref 78.0–100.0)
MPV: 10.4 fL (ref 9.4–12.4)
Platelets: 298 10*3/uL (ref 150–400)
RBC: 4.4 MIL/uL (ref 3.87–5.11)
RDW: 13.8 % (ref 11.5–15.5)
WBC: 5.1 10*3/uL (ref 4.0–10.5)

## 2014-12-03 LAB — COMPREHENSIVE METABOLIC PANEL
ALT: 20 U/L (ref 0–35)
AST: 25 U/L (ref 0–37)
Albumin: 4.1 g/dL (ref 3.5–5.2)
Alkaline Phosphatase: 110 U/L (ref 39–117)
BILIRUBIN TOTAL: 0.5 mg/dL (ref 0.2–1.2)
BUN: 18 mg/dL (ref 6–23)
CO2: 28 meq/L (ref 19–32)
CREATININE: 0.98 mg/dL (ref 0.50–1.10)
Calcium: 9.4 mg/dL (ref 8.4–10.5)
Chloride: 103 mEq/L (ref 96–112)
GLUCOSE: 93 mg/dL (ref 70–99)
Potassium: 4.7 mEq/L (ref 3.5–5.3)
Sodium: 141 mEq/L (ref 135–145)
Total Protein: 6.8 g/dL (ref 6.0–8.3)

## 2014-12-03 LAB — LIPID PANEL
Cholesterol: 211 mg/dL — ABNORMAL HIGH (ref 0–200)
HDL: 101 mg/dL (ref >39)
LDL CALC: 90 mg/dL (ref 0–99)
TRIGLYCERIDES: 100 mg/dL (ref <150)
Total CHOL/HDL Ratio: 2.1 Ratio
VLDL: 20 mg/dL (ref 0–40)

## 2014-12-05 ENCOUNTER — Encounter: Payer: Self-pay | Admitting: Family Medicine

## 2014-12-05 ENCOUNTER — Encounter (INDEPENDENT_AMBULATORY_CARE_PROVIDER_SITE_OTHER): Payer: Self-pay

## 2014-12-05 ENCOUNTER — Ambulatory Visit (INDEPENDENT_AMBULATORY_CARE_PROVIDER_SITE_OTHER): Payer: Medicare Other

## 2014-12-05 ENCOUNTER — Ambulatory Visit (INDEPENDENT_AMBULATORY_CARE_PROVIDER_SITE_OTHER): Payer: Medicare Other | Admitting: Family Medicine

## 2014-12-05 VITALS — BP 142/84 | HR 79 | Resp 16 | Ht 65.0 in | Wt 134.0 lb

## 2014-12-05 DIAGNOSIS — F329 Major depressive disorder, single episode, unspecified: Secondary | ICD-10-CM

## 2014-12-05 DIAGNOSIS — H5789 Other specified disorders of eye and adnexa: Secondary | ICD-10-CM

## 2014-12-05 DIAGNOSIS — F32A Depression, unspecified: Secondary | ICD-10-CM

## 2014-12-05 DIAGNOSIS — I10 Essential (primary) hypertension: Secondary | ICD-10-CM

## 2014-12-05 DIAGNOSIS — H5711 Ocular pain, right eye: Secondary | ICD-10-CM

## 2014-12-05 DIAGNOSIS — N3001 Acute cystitis with hematuria: Secondary | ICD-10-CM

## 2014-12-05 DIAGNOSIS — Z1231 Encounter for screening mammogram for malignant neoplasm of breast: Secondary | ICD-10-CM

## 2014-12-05 DIAGNOSIS — R351 Nocturia: Secondary | ICD-10-CM

## 2014-12-05 DIAGNOSIS — K219 Gastro-esophageal reflux disease without esophagitis: Secondary | ICD-10-CM

## 2014-12-05 DIAGNOSIS — Z23 Encounter for immunization: Secondary | ICD-10-CM

## 2014-12-05 DIAGNOSIS — E785 Hyperlipidemia, unspecified: Secondary | ICD-10-CM

## 2014-12-05 DIAGNOSIS — H578 Other specified disorders of eye and adnexa: Secondary | ICD-10-CM

## 2014-12-05 LAB — POCT URINALYSIS DIPSTICK
Bilirubin, UA: NEGATIVE
Glucose, UA: NEGATIVE
Ketones, UA: NEGATIVE
Nitrite, UA: POSITIVE
Protein, UA: NEGATIVE
Spec Grav, UA: 1.01
Urobilinogen, UA: 0.2
pH, UA: 7.5

## 2014-12-05 MED ORDER — RAMIPRIL 5 MG PO CAPS: 5.0000 mg | ORAL_CAPSULE | Freq: Every day | ORAL | Status: DC

## 2014-12-05 MED ORDER — SERTRALINE HCL 50 MG PO TABS: ORAL_TABLET | ORAL | Status: DC

## 2014-12-05 MED ORDER — IMIPRAMINE HCL 10 MG PO TABS: 10.0000 mg | ORAL_TABLET | Freq: Every day | ORAL | Status: DC

## 2014-12-05 NOTE — Progress Notes (Signed)
   Subjective:    Patient ID: Kelsey Nelson, female    DOB: March 07, 1934, 80 y.o.   MRN: [id number]  HPI The PT is here for follow up and re-evaluation of chronic medical conditions, medication management and review of any available recent lab and radiology data.  Preventive health is updated, specifically  Cancer screening and Immunization. Needs mammogram and flu  vaccine  Questions or concerns regarding consultations or procedures which the PT has had in the interim are  Addressed.Has had 2 eye exams in past 7 weeks for painful red eye, has no vision from the eye , which is not new, has been seen in the past at [facility] will refer back there The PT denies any adverse reactions to current medications since the last visit.  C/o nocturia and poor sleep with small frequent urination, denies fever, chills or flank pain   Review of Systems See HPI Denies recent fever or chills. Denies sinus pressure, nasal congestion, ear pain or sore throat. Denies chest congestion, productive cough or wheezing. Denies chest pains, palpitations and leg swelling Denies abdominal pain, nausea, vomiting,diarrhea or constipation.   Denies joint pain, swelling and limitation in mobility. Denies headaches, seizures, numbness, or tingling. Denies depression, anxiety or insomnia. Denies skin break down or rash.        Objective:   Physical Exam BP 142/84 mmHg  Pulse 79  Resp 16  Ht 5\' 5"  (1.651 m)  Wt 134 lb (60.782 kg)  BMI 22.30 kg/m2  SpO2 97% Patient alert and oriented and in no cardiopulmonary distress.  HEENT: No facial asymmetry, EOMI,   oropharynx pink and moist.  Neck supple no JVD, no mass. Right eye slightly red and watery Chest: Clear to auscultation bilaterally.  CVS: S1, S2 no murmurs, no S3.Regular rate.  ABD: Soft non tender. No renal angle or suprapubic tenderness  Ext: No edema  MS: Adequate ROM spine, shoulders, hips and knees.  Skin: Intact, no ulcerations or rash  noted.  Psych: Good eye contact, normal affect. Memory intact not anxious or depressed appearing.  CNS: CN 2-12 intact, power,  normal throughout.no focal deficits noted.       Assessment & Plan:

## 2014-12-05 NOTE — Patient Instructions (Addendum)
Annual wellness in 4 month, call if you need me before  Pls bring back urine for testing today, will follow up as we discussed, waiting on culture before prescribing antibiotic if needed  New for frequency and to help with sleep is imipramine  You are referred to [facility] for eye exam due to 6 week h/o pain and redness  You are referred for a mammogram

## 2014-12-05 NOTE — Addendum Note (Signed)
Addended by: Eual Fines on: 12/05/2014 11:49 AM   Modules accepted: Orders

## 2014-12-05 NOTE — Assessment & Plan Note (Signed)
Ongoing nocturia, with sleep disturbance, c/o voiding small quantities urine also, will test for uTI and start imipramine , may need urologgy eval has been in the past but not satisfied , will see how she does

## 2014-12-05 NOTE — Assessment & Plan Note (Signed)
Vaccine administered at visit.  

## 2014-12-05 NOTE — Assessment & Plan Note (Addendum)
Not at goal, no change in medication DASH diet and commitment to daily physical activity for a minimum of 30 minutes discussed and encouraged, as a part of hypertension management. The importance of attaining a healthy weight is also discussed.

## 2014-12-05 NOTE — Assessment & Plan Note (Signed)
Controlled, no change in medication  

## 2014-12-05 NOTE — Assessment & Plan Note (Signed)
6 week history, has been started on drop for glaucoma and also artificial tear, had brain scan , no abnormality, ongoing paIN AND REDNESS, SCANT VISION IN TH EAFFECTED EYE

## 2014-12-05 NOTE — Assessment & Plan Note (Signed)
Still slightly elevated, need to reduce fried and fatty food no med change

## 2014-12-08 LAB — URINE CULTURE

## 2014-12-08 MED ORDER — LEVOFLOXACIN 500 MG PO TABS: 500.0000 mg | ORAL_TABLET | Freq: Every day | ORAL | Status: DC

## 2014-12-08 NOTE — Addendum Note (Signed)
Addended by: Denman George B on: 12/08/2014 12:09 PM   Modules accepted: Orders

## 2014-12-13 ENCOUNTER — Telehealth: Payer: Self-pay | Admitting: Family Medicine

## 2014-12-13 MED ORDER — RISEDRONATE SODIUM 35 MG PO TABS: ORAL_TABLET | ORAL | Status: DC

## 2014-12-13 NOTE — Telephone Encounter (Signed)
Left message that [facility] called her and stated that Dr Moshe Cipro needs o call them about a RX that was called into them the 14 th medicine number is [phone] and it needs to be done today

## 2014-12-13 NOTE — Telephone Encounter (Signed)
Done

## 2014-12-29 ENCOUNTER — Ambulatory Visit (HOSPITAL_COMMUNITY): Payer: Medicare Other

## 2015-01-05 ENCOUNTER — Ambulatory Visit (HOSPITAL_COMMUNITY)
Admission: RE | Admit: 2015-01-05 | Discharge: 2015-01-05 | Disposition: A | Discharge status: Home / Self Care | Payer: PPO | Source: Ambulatory Visit | Attending: Family Medicine | Admitting: Family Medicine

## 2015-01-05 DIAGNOSIS — Z1231 Encounter for screening mammogram for malignant neoplasm of breast: Secondary | ICD-10-CM | POA: Insufficient documentation

## 2015-04-13 ENCOUNTER — Encounter: Payer: Medicare Other | Admitting: Family Medicine

## 2015-04-27 ENCOUNTER — Other Ambulatory Visit: Payer: Self-pay | Admitting: Family Medicine

## 2015-05-04 ENCOUNTER — Encounter (HOSPITAL_COMMUNITY): Payer: Self-pay

## 2015-05-04 ENCOUNTER — Encounter (HOSPITAL_COMMUNITY)
Admission: RE | Admit: 2015-05-04 | Discharge: 2015-05-04 | Disposition: A | Discharge status: Home / Self Care | Payer: PPO | Source: Ambulatory Visit | Attending: Ophthalmology | Admitting: Ophthalmology

## 2015-05-04 DIAGNOSIS — H5711 Ocular pain, right eye: Secondary | ICD-10-CM | POA: Insufficient documentation

## 2015-05-04 DIAGNOSIS — H547 Unspecified visual loss: Secondary | ICD-10-CM | POA: Diagnosis not present

## 2015-05-04 DIAGNOSIS — Z01818 Encounter for other preprocedural examination: Secondary | ICD-10-CM | POA: Insufficient documentation

## 2015-05-04 LAB — BASIC METABOLIC PANEL
ANION GAP: 9 (ref 5–15)
BUN: 19 mg/dL (ref 6–20)
CHLORIDE: 103 mmol/L (ref 101–111)
CO2: 27 mmol/L (ref 22–32)
Calcium: 9.9 mg/dL (ref 8.9–10.3)
Creatinine, Ser: 0.88 mg/dL (ref 0.44–1.00)
GFR calc non Af Amer: >60 mL/min (ref >60)
Glucose, Bld: 113 mg/dL — ABNORMAL HIGH (ref 65–99)
POTASSIUM: 4.1 mmol/L (ref 3.5–5.1)
SODIUM: 139 mmol/L (ref 135–145)

## 2015-05-04 LAB — CBC WITH DIFFERENTIAL/PLATELET
BASOS ABS: 0 10*3/uL (ref 0.0–0.1)
Basophils Relative: 1 % (ref 0–1)
Eosinophils Absolute: 0.1 10*3/uL (ref 0.0–0.7)
Eosinophils Relative: 2 % (ref 0–5)
HCT: 41.2 % (ref 36.0–46.0)
Hemoglobin: 13.5 g/dL (ref 12.0–15.0)
Lymphocytes Relative: 32 % (ref 12–46)
Lymphs Abs: 1.8 10*3/uL (ref 0.7–4.0)
MCH: 30.5 pg (ref 26.0–34.0)
MCHC: 32.8 g/dL (ref 30.0–36.0)
MCV: 93.2 fL (ref 78.0–100.0)
MONOS PCT: 10 % (ref 3–12)
Monocytes Absolute: 0.6 10*3/uL (ref 0.1–1.0)
NEUTROS ABS: 3.1 10*3/uL (ref 1.7–7.7)
NEUTROS PCT: 55 % (ref 43–77)
Platelets: 273 10*3/uL (ref 150–400)
RBC: 4.42 MIL/uL (ref 3.87–5.11)
RDW: 13.5 % (ref 11.5–15.5)
WBC: 5.6 10*3/uL (ref 4.0–10.5)

## 2015-05-04 NOTE — Progress Notes (Signed)
Pt called and notified of time change.  Pt to be here on 05/08/2015 @ 6:15 am.

## 2015-05-04 NOTE — Patient Instructions (Signed)
RANEE PEASLEY  05/04/2015   Your procedure is scheduled on:  05/08/2015  Report to [facility] at 9:00 AM.  Call this number if you have problems the morning of surgery: [phone]   Remember:   Do not eat food or drink liquids after midnight.   Take these medicines the morning of surgery with A SIP OF WATER: Altace and Zoloft   Do not wear jewelry, make-up or nail polish.  Do not wear lotions, powders, or perfumes. You may wear deodorant.  Do not shave 48 hours prior to surgery. Men may shave face and neck.  Do not bring valuables to the hospital.  [facility] is not responsible                  for any belongings or valuables.               Contacts, dentures or bridgework may not be worn into surgery.  Leave suitcase in the car. After surgery it may be brought to your room.  For patients admitted to the hospital, discharge time is determined by your                treatment team.               Patients discharged the day of surgery will not be allowed to drive  home.  Name and phone number of your driver: family  Special Instructions: N/A   Please read over the following fact sheets that you were given: Care and Recovery After Surgery

## 2015-05-08 ENCOUNTER — Ambulatory Visit (HOSPITAL_COMMUNITY)
Admission: RE | Admit: 2015-05-08 | Discharge: 2015-05-08 | Disposition: A | Discharge status: Home / Self Care | Payer: PPO | Source: Ambulatory Visit | Attending: Ophthalmology | Admitting: Ophthalmology

## 2015-05-08 ENCOUNTER — Ambulatory Visit (HOSPITAL_COMMUNITY): Payer: PPO | Admitting: Anesthesiology

## 2015-05-08 ENCOUNTER — Encounter (HOSPITAL_COMMUNITY): Payer: Self-pay | Admitting: *Deleted

## 2015-05-08 ENCOUNTER — Encounter (HOSPITAL_COMMUNITY)
Admission: RE | Disposition: A | Discharge status: Home / Self Care | Payer: Self-pay | Source: Ambulatory Visit | Attending: Ophthalmology

## 2015-05-08 DIAGNOSIS — K219 Gastro-esophageal reflux disease without esophagitis: Secondary | ICD-10-CM | POA: Insufficient documentation

## 2015-05-08 DIAGNOSIS — H5441 Blindness, right eye, normal vision left eye: Secondary | ICD-10-CM | POA: Diagnosis present

## 2015-05-08 DIAGNOSIS — I10 Essential (primary) hypertension: Secondary | ICD-10-CM | POA: Diagnosis not present

## 2015-05-08 DIAGNOSIS — F329 Major depressive disorder, single episode, unspecified: Secondary | ICD-10-CM | POA: Diagnosis not present

## 2015-05-08 DIAGNOSIS — H5711 Ocular pain, right eye: Secondary | ICD-10-CM | POA: Insufficient documentation

## 2015-05-08 HISTORY — PX: EVISCERATION: SHX1539

## 2015-05-08 SURGERY — REPAIR, EVISCERATION, ABDOMEN
Anesthesia: General | Site: Eye | Laterality: Right

## 2015-05-08 MED ORDER — ONDANSETRON HCL 4 MG/2ML IJ SOLN
INTRAMUSCULAR | Status: AC
  Filled 2015-05-08: qty 2

## 2015-05-08 MED ORDER — FENTANYL CITRATE (PF) 100 MCG/2ML IJ SOLN
INTRAMUSCULAR | Status: DC | PRN
  Administered 2015-05-08 (×4): 25 ug via INTRAVENOUS

## 2015-05-08 MED ORDER — FENTANYL CITRATE (PF) 100 MCG/2ML IJ SOLN
INTRAMUSCULAR | Status: AC
  Filled 2015-05-08: qty 2

## 2015-05-08 MED ORDER — POVIDONE-IODINE 5 % OP SOLN
1.0000 "application " | Freq: Once | OPHTHALMIC | Status: DC
  Filled 2015-05-08: qty 30

## 2015-05-08 MED ORDER — SODIUM CHLORIDE 0.9 % IJ SOLN
INTRAMUSCULAR | Status: AC
  Filled 2015-05-08: qty 10

## 2015-05-08 MED ORDER — MIDAZOLAM HCL 2 MG/2ML IJ SOLN
1.0000 mg | INTRAMUSCULAR | Status: DC | PRN
  Administered 2015-05-08: 2 mg via INTRAVENOUS

## 2015-05-08 MED ORDER — LIDOCAINE HCL (PF) 1 % IJ SOLN
INTRAMUSCULAR | Status: AC
  Filled 2015-05-08: qty 2

## 2015-05-08 MED ORDER — NEOSTIGMINE METHYLSULFATE 10 MG/10ML IV SOLN: INTRAVENOUS | Status: DC | PRN

## 2015-05-08 MED ORDER — BACITRACIN-NEOMYCIN-POLYMYXIN 400-5-5000 EX OINT
TOPICAL_OINTMENT | CUTANEOUS | Status: AC
  Filled 2015-05-08: qty 1

## 2015-05-08 MED ORDER — PROPOFOL 10 MG/ML IV BOLUS
INTRAVENOUS | Status: DC | PRN
  Administered 2015-05-08: 150 mg via INTRAVENOUS

## 2015-05-08 MED ORDER — FENTANYL CITRATE (PF) 100 MCG/2ML IJ SOLN
25.0000 ug | INTRAMUSCULAR | Status: DC | PRN
  Administered 2015-05-08 (×2): 50 ug via INTRAVENOUS

## 2015-05-08 MED ORDER — TETRACAINE HCL 0.5 % OP SOLN
OPHTHALMIC | Status: AC
  Filled 2015-05-08: qty 2

## 2015-05-08 MED ORDER — TETRACAINE HCL 0.5 % OP SOLN
1.0000 [drp] | OPHTHALMIC | Status: AC
  Administered 2015-05-08 (×3): 1 [drp] via OPHTHALMIC

## 2015-05-08 MED ORDER — LIDOCAINE HCL 3.5 % OP GEL
1.0000 "application " | Freq: Once | OPHTHALMIC | Status: AC
  Administered 2015-05-08: 1 via OPHTHALMIC

## 2015-05-08 MED ORDER — FENTANYL CITRATE (PF) 100 MCG/2ML IJ SOLN
25.0000 ug | Freq: Once | INTRAMUSCULAR | Status: AC
  Administered 2015-05-08: 25 ug via INTRAVENOUS

## 2015-05-08 MED ORDER — BUPIVACAINE HCL (PF) 0.25 % IJ SOLN
INTRAMUSCULAR | Status: AC
  Filled 2015-05-08: qty 30

## 2015-05-08 MED ORDER — MIDAZOLAM HCL 2 MG/2ML IJ SOLN
INTRAMUSCULAR | Status: AC
  Filled 2015-05-08: qty 2

## 2015-05-08 MED ORDER — STERILE WATER FOR IRRIGATION IR SOLN
Status: DC | PRN
  Administered 2015-05-08: 250 mL

## 2015-05-08 MED ORDER — LIDOCAINE HCL 3.5 % OP GEL
OPHTHALMIC | Status: AC
  Filled 2015-05-08: qty 1

## 2015-05-08 MED ORDER — ATROPINE SULFATE 1 MG/ML IJ SOLN
INTRAMUSCULAR | Status: AC
  Filled 2015-05-08: qty 1

## 2015-05-08 MED ORDER — ONDANSETRON HCL 4 MG/2ML IJ SOLN
4.0000 mg | Freq: Once | INTRAMUSCULAR | Status: AC | PRN
  Administered 2015-05-08: 4 mg via INTRAVENOUS

## 2015-05-08 MED ORDER — LIDOCAINE HCL 1 % IJ SOLN
INTRAMUSCULAR | Status: DC | PRN
  Administered 2015-05-08: 25 mg via INTRADERMAL

## 2015-05-08 MED ORDER — ATROPINE SULFATE 1 MG/ML IJ SOLN
INTRAMUSCULAR | Status: DC | PRN
  Administered 2015-05-08: 0.4 mg via INTRAVENOUS

## 2015-05-08 MED ORDER — PROPOFOL 10 MG/ML IV BOLUS
INTRAVENOUS | Status: AC
  Filled 2015-05-08: qty 20

## 2015-05-08 MED ORDER — FENTANYL CITRATE (PF) 100 MCG/2ML IJ SOLN
25.0000 ug | INTRAMUSCULAR | Status: AC | PRN
  Administered 2015-05-08 (×2): 25 ug via INTRAVENOUS

## 2015-05-08 MED ORDER — MIDAZOLAM HCL 5 MG/5ML IJ SOLN
INTRAMUSCULAR | Status: DC | PRN
  Administered 2015-05-08 (×2): 1 mg via INTRAVENOUS

## 2015-05-08 MED ORDER — LACTATED RINGERS IV SOLN
INTRAVENOUS | Status: DC
  Administered 2015-05-08: 1000 mL via INTRAVENOUS

## 2015-05-08 MED ORDER — BACITRACIN-POLYMYXIN B 500-10000 UNIT/GM OP OINT
TOPICAL_OINTMENT | Freq: Once | OPHTHALMIC | Status: AC
  Administered 2015-05-08: 1 via OPHTHALMIC
  Filled 2015-05-08: qty 3.5

## 2015-05-08 MED ORDER — GLYCOPYRROLATE 0.2 MG/ML IJ SOLN
INTRAMUSCULAR | Status: AC
  Filled 2015-05-08: qty 1

## 2015-05-08 MED ORDER — ONDANSETRON HCL 4 MG/2ML IJ SOLN
4.0000 mg | Freq: Once | INTRAMUSCULAR | Status: AC
  Administered 2015-05-08: 4 mg via INTRAVENOUS

## 2015-05-08 MED ORDER — BUPIVACAINE HCL (PF) 0.25 % IJ SOLN
INTRAMUSCULAR | Status: DC | PRN
  Administered 2015-05-08: 3.5 mL

## 2015-05-08 MED ORDER — GLYCOPYRROLATE 0.2 MG/ML IJ SOLN
INTRAMUSCULAR | Status: DC | PRN
  Administered 2015-05-08: 0.2 mg via INTRAVENOUS

## 2015-05-08 MED ORDER — EPHEDRINE SULFATE 50 MG/ML IJ SOLN
INTRAMUSCULAR | Status: AC
  Filled 2015-05-08: qty 1

## 2015-05-08 MED ORDER — EPHEDRINE SULFATE 50 MG/ML IJ SOLN
INTRAMUSCULAR | Status: DC | PRN
Start: 2015-05-08 — End: 2015-05-08
  Administered 2015-05-08: 10 mg via INTRAVENOUS

## 2015-05-08 MED ORDER — LIDOCAINE HCL (PF) 1 % IJ SOLN
INTRAMUSCULAR | Status: AC
  Filled 2015-05-08: qty 5

## 2015-05-08 SURGICAL SUPPLY — 28 items
BAG HAMPER (MISCELLANEOUS) ×3 IMPLANT
BLADE SURG SZ11 CARB STEEL (BLADE) ×3 IMPLANT
CLOTH BEACON ORANGE TIMEOUT ST (SAFETY) ×3 IMPLANT
CONFORMERS SILICONE  MED ×3 IMPLANT
COVER LIGHT HANDLE STERIS (MISCELLANEOUS) ×3 IMPLANT
DRAPE EENT ADH APERT 31X51 STR (DRAPES) ×3 IMPLANT
EYE SHIELD UNIVERSAL CLEAR (GAUZE/BANDAGES/DRESSINGS) ×3 IMPLANT
FORMALIN 10 PREFIL 120ML (MISCELLANEOUS) ×3 IMPLANT
GAUZE SPONGE 4X4 12PLY STRL (GAUZE/BANDAGES/DRESSINGS) ×3 IMPLANT
GLOVE BIO SURGEON STRL SZ7.5 (GLOVE) ×3 IMPLANT
GLOVE BIOGEL PI IND STRL 7.0 (GLOVE) ×1 IMPLANT
GLOVE BIOGEL PI INDICATOR 7.0 (GLOVE) ×2
GLOVE ECLIPSE 7.0 STRL STRAW (GLOVE) ×3 IMPLANT
GLOVE SS BIOGEL STRL SZ 6.5 (GLOVE) ×1 IMPLANT
GLOVE SUPERSENSE BIOGEL SZ 6.5 (GLOVE) ×2
GOWN STRL REUS W/TWL LRG LVL3 (GOWN DISPOSABLE) ×6 IMPLANT
IMPLANT SPHERE SILICON 16MM (Ophthalmic Related) ×3 IMPLANT
KIT SURGICAL DEVON (SET/KITS/TRAYS/PACK) ×3 IMPLANT
NEEDLE HYPO 21X1 ECLIPSE (NEEDLE) ×3 IMPLANT
PACK BASIC III (CUSTOM PROCEDURE TRAY) ×2
PACK SRG BSC III STRL LF ECLPS (CUSTOM PROCEDURE TRAY) ×1 IMPLANT
SUT 6 0 PLAIN (SUTURE) ×3 IMPLANT
SUT VIC AB 4-0 PS2 27 (SUTURE) ×3 IMPLANT
SUT VIC AB 5-0 P-3 18X BRD (SUTURE) ×1 IMPLANT
SUT VIC AB 5-0 P3 18 (SUTURE) ×2
SYR BULB IRRIGATION 50ML (SYRINGE) ×3 IMPLANT
SYR CONTROL 10ML LL (SYRINGE) ×3 IMPLANT
YANKAUER SUCT BULB TIP 10FT TU (MISCELLANEOUS) ×3 IMPLANT

## 2015-05-08 NOTE — Anesthesia Preprocedure Evaluation (Signed)
Anesthesia Evaluation  Patient identified by MRN, date of birth, ID band Patient awake    Reviewed: Allergy & Precautions, NPO status , Patient's Chart, lab work & pertinent test results  Airway Mallampati: II  TM Distance: >3 FB     Dental  (+) Partial Upper, Dental Advisory Given   Pulmonary neg pulmonary ROS,  breath sounds clear to auscultation        Cardiovascular hypertension, Pt. on medications Rhythm:Regular Rate:Normal     Neuro/Psych PSYCHIATRIC DISORDERS Depression    GI/Hepatic GERD-  Controlled and Medicated,  Endo/Other    Renal/GU Renal disease     Musculoskeletal   Abdominal   Peds  Hematology   Anesthesia Other Findings   Reproductive/Obstetrics                             Anesthesia Physical Anesthesia Plan  ASA: III  Anesthesia Plan: General   Post-op Pain Management:    Induction: Intravenous  Airway Management Planned: LMA  Additional Equipment:   Intra-op Plan:   Post-operative Plan: Extubation in OR  Informed Consent: I have reviewed the patients History and Physical, chart, labs and discussed the procedure including the risks, benefits and alternatives for the proposed anesthesia with the patient or authorized representative who has indicated his/her understanding and acceptance.     Plan Discussed with:   Anesthesia Plan Comments:         Anesthesia Quick Evaluation

## 2015-05-08 NOTE — H&P (Signed)
I have reviewed the pre printed H&P, the patient was re-examined, and I have identified no significant interval changes in the patient's medical condition.  There is no change in the plan of care since the history and physical of record. 

## 2015-05-08 NOTE — Discharge Instructions (Signed)

## 2015-05-08 NOTE — Op Note (Signed)
05/08/2015  9:20 AM  PATIENT:  Kelsey Nelson  80 y.o. female  PRE-OPERATIVE DIAGNOSIS:  blind painful right eye  POST-OPERATIVE DIAGNOSIS:  blind painful right eye  PROCEDURE:  Procedure(s): EVISCERATION WITH IMPLANT RIGHT EYE  SURGEON:  Surgeon(s): Williams Che, MD  ASSISTANTS:   Zoila Shutter, CST   ANESTHESIA STAFF: Anesthesiologist: Lerry Liner, MD CRNA: Charmaine Downs, CRNA  ANESTHESIA:   general  REQUESTED LENS POWER: none  LENS IMPLANT INFORMATION: @ORIMPLANT @  CUMULATIVE DISSIPATED ENERGY:none  INDICATIONS:blind painful eye, not otherwise manageable  OP FINDINGS:corneal scar, displaced pupil, conjunctival injections, firm eye  COMPLICATIONS:None  PROCEDURE:  The was brought to the operating room in good condition and had general anesthesia administered, then prepped and draped in the usual fashion for intraocular surgery.  A 360 degree conjunctival peritomy was performed with bishop harmon forceps and iris scissors.  The cornea was excised with a #11 blade.  The ocular contents were removed with an evisceration spoon.  The ocular interior was carefully stripped with a frazier suction until free of all pigment.  Hemostasis was obtained with gentile pressure from a sterile test tube inserted into the posterior pole.  A  84TX silicone ball implant  Was placed with an implant inserter.  Good tissue overlap was noted.  The sclera was winged and sutured with 5-0 white vicryl interrupted sutures.  The conjunctiva was closed with a running 5-0 white vicryl suture.  Bacitracin Oph ung was placed onto the suture line and a silicone conformer placed.  A retrobulbar marcaine injection was performed to insure postoperative comfort.  The patient was extubated and transferred to PACU for recovery.  PATIENT DISPOSITION:  PACU - hemodynamically stable. and home from short stay.

## 2015-05-08 NOTE — Transfer of Care (Signed)
Immediate Anesthesia Transfer of Care Note  Patient: Kelsey Nelson  Procedure(s) Performed: Procedure(s): EVISCERATION WITH IMPLANT RIGHT EYE (Right)  Patient Location: PACU  Anesthesia Type:General  Level of Consciousness: awake, alert , oriented and patient cooperative  Airway & Oxygen Therapy: Patient Spontanous Breathing and Patient connected to face mask oxygen  Post-op Assessment: Report given to RN, Post -op Vital signs reviewed and stable and Patient moving all extremities  Post vital signs: Reviewed and stable  Last Vitals:  Filed Vitals:   05/08/15 0735  BP: 152/77  Temp:   Resp: 14    Complications: No apparent anesthesia complications

## 2015-05-08 NOTE — Brief Op Note (Signed)
05/08/2015  9:13 AM  PATIENT:  Kelsey Nelson  81 y.o. female  PRE-OPERATIVE DIAGNOSIS:  blind painful right eye  POST-OPERATIVE DIAGNOSIS:  blind painful right eye  PROCEDURE:  Procedure(s): EVISCERATION WITH IMPLANT RIGHT EYE (Right)  SURGEON:  Surgeon(s) and Role:    * Williams Che, MD - Primary  PHYSICIAN ASSISTANT:   ASSISTANTS:   Zoila Shutter, CST  ANESTHESIA:   general  EBL:  Total I/O In: 600 [I.V.:600] Out: -   BLOOD ADMINISTERED:none  DRAINS: none   LOCAL MEDICATIONS USED:  MARCAINE  Retrobulbar  Approximately 4 mL   SPECIMEN:  Source of Specimen:  #1  cornea;  #2 ocular contents (vitreous, retina, sclera  DISPOSITION OF SPECIMEN:  PATHOLOGY  COUNTS:  YES  TOURNIQUET:  * No tourniquets in log *  DICTATION: .Note written in EPIC  PLAN OF CARE: Discharge to home after PACU  PATIENT DISPOSITION:  Short Stay   Delay start of Pharmacological VTE agent (>24hrs) due to surgical blood loss or risk of bleeding: no

## 2015-05-08 NOTE — Anesthesia Postprocedure Evaluation (Signed)
  Anesthesia Post-op Note  Patient: Kelsey Nelson  Procedure(s) Performed: Procedure(s): EVISCERATION WITH IMPLANT RIGHT EYE (Right)  Patient Location: PACU  Anesthesia Type:General  Level of Consciousness: awake, alert , oriented and patient cooperative  Airway and Oxygen Therapy: Patient Spontanous Breathing  Post-op Pain: none  Post-op Assessment: Post-op Vital signs reviewed, Patient's Cardiovascular Status Stable, Respiratory Function Stable, Patent Airway, No signs of Nausea or vomiting and Pain level controlled  Post-op Vital Signs: Reviewed and stable  Last Vitals:  Filed Vitals:   05/08/15 0735  BP: 152/77  Temp:   Resp: 14    Complications: No apparent anesthesia complications

## 2015-05-08 NOTE — Anesthesia Procedure Notes (Signed)
Procedure Name: LMA Insertion Date/Time: 05/08/2015 7:40 AM Performed by: Charmaine Downs Pre-anesthesia Checklist: Patient identified, Emergency Drugs available, Suction available and Patient being monitored Patient Re-evaluated:Patient Re-evaluated prior to inductionOxygen Delivery Method: Circle system utilized Preoxygenation: Pre-oxygenation with 100% oxygen Intubation Type: IV induction Ventilation: Mask ventilation without difficulty LMA: LMA inserted LMA Size: 4.0 Tube type: Oral Number of attempts: 1 Placement Confirmation: positive ETCO2 and breath sounds checked- equal and bilateral Tube secured with: Tape Dental Injury: Teeth and Oropharynx as per pre-operative assessment

## 2015-05-10 ENCOUNTER — Encounter (HOSPITAL_COMMUNITY): Payer: Self-pay | Admitting: Ophthalmology

## 2015-05-11 ENCOUNTER — Other Ambulatory Visit: Payer: Self-pay | Admitting: Family Medicine

## 2015-05-29 ENCOUNTER — Emergency Department (HOSPITAL_COMMUNITY): Admission: EM | Admit: 2015-05-29 | Payer: PPO | Source: Home / Self Care

## 2015-06-05 ENCOUNTER — Ambulatory Visit (INDEPENDENT_AMBULATORY_CARE_PROVIDER_SITE_OTHER): Payer: PPO | Admitting: Family Medicine

## 2015-06-05 ENCOUNTER — Encounter: Payer: Self-pay | Admitting: Family Medicine

## 2015-06-05 VITALS — BP 152/86 | HR 93 | Resp 18 | Ht 65.0 in | Wt 126.1 lb

## 2015-06-05 DIAGNOSIS — Z23 Encounter for immunization: Secondary | ICD-10-CM | POA: Diagnosis not present

## 2015-06-05 DIAGNOSIS — R110 Nausea: Secondary | ICD-10-CM | POA: Diagnosis not present

## 2015-06-05 DIAGNOSIS — R3915 Urgency of urination: Secondary | ICD-10-CM

## 2015-06-05 DIAGNOSIS — F329 Major depressive disorder, single episode, unspecified: Secondary | ICD-10-CM

## 2015-06-05 DIAGNOSIS — I10 Essential (primary) hypertension: Secondary | ICD-10-CM

## 2015-06-05 DIAGNOSIS — F419 Anxiety disorder, unspecified: Secondary | ICD-10-CM

## 2015-06-05 DIAGNOSIS — H5711 Ocular pain, right eye: Secondary | ICD-10-CM | POA: Diagnosis not present

## 2015-06-05 DIAGNOSIS — F418 Other specified anxiety disorders: Secondary | ICD-10-CM

## 2015-06-05 MED ORDER — GABAPENTIN 100 MG PO CAPS: ORAL_CAPSULE | ORAL | Status: DC

## 2015-06-05 MED ORDER — PREDNISONE 5 MG PO TABS: 5.0000 mg | ORAL_TABLET | Freq: Two times a day (BID) | ORAL | Status: DC

## 2015-06-05 NOTE — Patient Instructions (Signed)
F/u in 3 weeks, call if you need me before   New for eye pain is gabapentin as we discussed, take at night only.   New for management of pain due to nerve irritation is gabapentin 100 mg.  Start one capsule at bedtime, after 3 to 5 days increase , if needed, to two capsules at bedtime, and after an additional 5 days to 3 capsules at bedtime if needed , for pain control.  Please note, the script is written as one three times daily, DO NOT take like this, instead take only at bedtime as above.  For chronic nausea resume zoloft, also 5 day course of prednisone is prescribed  Please resume your blood pressure medication also  SORRY you had such a hard time, you are now on the mend  You are referred to Dr Lyla Son for pain management as recommended by Dr Iona Hansen

## 2015-06-06 ENCOUNTER — Telehealth: Payer: Self-pay | Admitting: *Deleted

## 2015-06-06 DIAGNOSIS — Z23 Encounter for immunization: Secondary | ICD-10-CM | POA: Insufficient documentation

## 2015-06-06 NOTE — Assessment & Plan Note (Signed)
Open wound still present in right eye socket, After obtaining informed consent, the vaccine is  administered by LPN.

## 2015-06-06 NOTE — Telephone Encounter (Signed)
Pt called stating Dr. Francesco Runner can't see her until 07/06/15, pt is requesting another eye specialist. Please advise

## 2015-06-06 NOTE — Progress Notes (Signed)
   Subjective:    Patient ID: Kelsey Nelson, female    DOB: Oct 07, 1934, 81 y.o.   MRN: [id number]  HPI Pt in for f/u recent UC visit for urinary frequency which has resolved, she denies ever having dysuria, still c/o lower abdominal pain and nausea, stopped zoloft which she has been talking for chronic nausea successfully , will resume same and f/u on urine c/s Very disturbed about persistent right eye pain unchanged for 6 months , despite recent removal of the eyeball, asks fo help with this   Review of Systems See HPI Denies recent fever or chills. Denies sinus pressure, nasal congestion, ear pain or sore throat. Denies chest congestion, productive cough or wheezing. Denies chest pains, palpitations and leg swelling C/o lower abdominal abdominal pain and  Nausea,denies  vomiting,diarrhea or constipation.   Denies current  dysuria, frequency, hesitancy or incontinence. Denies joint pain, swelling and limitation in mobility. Denies , seizures, numbness, or tingling. Reports no change in right eye pain x 6 months despite surgery       Objective:   Physical Exam BP 152/86 mmHg  Pulse 93  Resp 18  Ht 5\' 5"  (1.651 m)  Wt 126 lb 1.9 oz (57.208 kg)  BMI 20.99 kg/m2  SpO2 94% Patient alert and oriented and in no cardiopulmonary distress.Tearful and at times visibly very upset and anxious  HEENT: No facial asymmetry,   oropharynx pink and moist.  Neck supple no JVD, no mass.Right eyeball removed, surgical site still open with scant cream dranage  Chest: Clear to auscultation bilaterally.  CVS: S1, S2 no murmurs, no S3.Regular rate.  ABD: Soft non localized guarding ,  Tenderness or rebound  Ext: No edema  MS: Adequate ROM spine, shoulders, hips and knees.  Skin: Intact, no ulcerations or rash noted.  Psych: Good eye contact, t. Memory intact tearful,  anxious , and mildly  depressed appearing.  CNS: CN 2-12 intact, power,  normal throughout.no focal deficits noted.        Assessment & Plan:  Pain of right eye Ongoing right eye pain 4 weeks following removal of eyeball, pt is distraught, wants to know what to do. Will start gabapentin at bedtime , titrating  up, call with concerns, also referred to pain specialist per request of opthalmologist. I discouraged further eye evaluations at this time   Nausea Chronic nausea and abdominal pain, resume zoloft and 5 day course  Of prednisone   Essential hypertension Uncontrolled , stopped taking medication, states she actually wanted to die due to excruciating pain following right eye surgery  Urinary urgency Recently diagnosed with UTI at [facility] and treated, still has some lower abdominal discomfort , denies frequency or dysuria, will f/u on urine c/s  Need for diphtheria-tetanus-pertussis (Tdap) vaccine, adult/adolescent Open wound still present in right eye socket, After obtaining informed consent, the vaccine is  administered by LPN.   Anxiety and depression Pt tearful and anxious, emotionally traumatized from recent eye surgery, will resume zoloft and  Return in 3 weeks States she had excruciating pain post op, which made her wish she were dead, so she intentionally stopped all her meds Never had suicidal or homicidal intent , and not currently

## 2015-06-06 NOTE — Assessment & Plan Note (Addendum)
Pt tearful and anxious, emotionally traumatized from recent eye surgery, will resume zoloft and  Return in 3 weeks States she had excruciating pain post op, which made her wish she were dead, so she intentionally stopped all her meds Never had suicidal or homicidal intent , and not currently

## 2015-06-06 NOTE — Assessment & Plan Note (Signed)
Chronic nausea and abdominal pain, resume zoloft and 5 day course  Of prednisone

## 2015-06-06 NOTE — Assessment & Plan Note (Signed)
Ongoing right eye pain 4 weeks following removal of eyeball, pt is distraught, wants to know what to do. Will start gabapentin at bedtime , titrating  up, call with concerns, also referred to pain specialist per request of opthalmologist. I discouraged further eye evaluations at this time

## 2015-06-06 NOTE — Assessment & Plan Note (Signed)
Uncontrolled , stopped taking medication, states she actually wanted to die due to excruciating pain following right eye surgery

## 2015-06-06 NOTE — Assessment & Plan Note (Signed)
Recently diagnosed with UTI at [facility] and treated, still has some lower abdominal discomfort , denies frequency or dysuria, will f/u on urine c/s

## 2015-06-08 ENCOUNTER — Other Ambulatory Visit: Payer: Self-pay

## 2015-06-08 DIAGNOSIS — N3000 Acute cystitis without hematuria: Secondary | ICD-10-CM

## 2015-06-09 NOTE — Telephone Encounter (Signed)
Patient will continue with appointment

## 2015-06-11 LAB — URINE CULTURE
Colony Count: NO GROWTH
Organism ID, Bacteria: NO GROWTH

## 2015-06-12 ENCOUNTER — Telehealth: Payer: Self-pay | Admitting: Family Medicine

## 2015-06-13 ENCOUNTER — Other Ambulatory Visit: Payer: Self-pay

## 2015-06-13 MED ORDER — PREDNISONE 5 MG PO TABS: 5.0000 mg | ORAL_TABLET | Freq: Two times a day (BID) | ORAL | Status: DC

## 2015-06-13 NOTE — Telephone Encounter (Signed)
pls efill prednisone x 1 let her knwo I am happy to know she is feeling some better

## 2015-06-13 NOTE — Telephone Encounter (Signed)
States the prednisone helped her nausea. Ran out and wants a few more because the nausea hasnt went away completely. I offered to call her in some anti nausea meds but she declined and said she would rather have a few prednisone because it worked well for her.

## 2015-06-19 ENCOUNTER — Ambulatory Visit (INDEPENDENT_AMBULATORY_CARE_PROVIDER_SITE_OTHER): Payer: PPO | Admitting: Family Medicine

## 2015-06-19 ENCOUNTER — Encounter: Payer: Self-pay | Admitting: Family Medicine

## 2015-06-19 VITALS — BP 128/80 | HR 76 | Resp 18 | Ht 65.0 in | Wt 127.0 lb

## 2015-06-19 DIAGNOSIS — R110 Nausea: Secondary | ICD-10-CM

## 2015-06-19 DIAGNOSIS — L989 Disorder of the skin and subcutaneous tissue, unspecified: Secondary | ICD-10-CM

## 2015-06-19 DIAGNOSIS — R432 Parageusia: Secondary | ICD-10-CM | POA: Diagnosis not present

## 2015-06-19 DIAGNOSIS — H5711 Ocular pain, right eye: Secondary | ICD-10-CM

## 2015-06-19 DIAGNOSIS — I10 Essential (primary) hypertension: Secondary | ICD-10-CM

## 2015-06-19 DIAGNOSIS — E785 Hyperlipidemia, unspecified: Secondary | ICD-10-CM

## 2015-06-19 HISTORY — DX: Disorder of the skin and subcutaneous tissue, unspecified: L98.9

## 2015-06-19 MED ORDER — MEGESTROL ACETATE 40 MG PO TABS: 40.0000 mg | ORAL_TABLET | Freq: Every day | ORAL | Status: DC

## 2015-06-19 MED ORDER — ONDANSETRON HCL 4 MG PO TABS: 4.0000 mg | ORAL_TABLET | Freq: Every day | ORAL | Status: DC | PRN

## 2015-06-19 NOTE — Progress Notes (Signed)
   Subjective:    Patient ID: Kelsey Nelson, female    DOB: 06/12/1934, 81 y.o.   MRN: [id number]  HPI  Pt in today stating appetite still poor, nausea daily , sweats/ chill/ hotfeeling in vessels until she eats, does not have  abdominal pain, no vomit, no change noted in stool caliber Notes many different skin lesions popping up on trunk, right  breast , and back will refer to  Derm Takes gabapentin still having eye pain, has another eye  appt next week arranged by her daughter in the next 1 week, also ahs upcoming appt with pain management Review of Systems See HPI Denies recent fever or chills.c/o poor appetite Denies sinus pressure, nasal congestion, ear pain or sore throat. Denies chest congestion, productive cough or wheezing. Denies chest pains, palpitations and leg swelling Denies abdominal pain, vomiting,diarrhea or constipation.   Denies dysuria, frequency, hesitancy or incontinence. Denies joint pain, swelling and limitation in mobility. Denies headaches, seizures, numbness, or tingling.         Objective:   Physical Exam BP 128/80 mmHg  Pulse 76  Resp 18  Ht 5\' 5"  (1.651 m)  Wt 127 lb (57.607 kg)  BMI 21.13 kg/m2  SpO2 96%  Patient alert and oriented and in no cardiopulmonary distress.  HEENT: No facial asymmetry, EOMI,   oropharynx pink and moist.  Neck supple no JVD, no mass.  Chest: Clear to auscultation bilaterally.  CVS: S1, S2 no murmurs, no S3.Regular rate.  ABD: Soft non tender.   Ext: No edema  MS: Adequate ROM spine, shoulders, hips and knees.  Skin: Intact,multiple hyperpigmented nodular lesions on trunk, also present on right breast  Psych: Good eye contact, normal affect. Memory intact not anxious or depressed appearing.  CNS: CN 2-12 intact, power,  normal throughout.no focal deficits noted.        Assessment & Plan:  Pain, eye, right Dr Patrice Paradise to evaluate pt with chronic right eye pain for 8 months, had enucleation on 05/09/2015,  still has the same pain rated at 2 to 3 more of a gritty feeling in socket  Skin lesions, generalized Generalized lesions, however new right breast hyperpigmented lesion of concern, referred to dermatology for further eval  Pain of right eye Ongoing discomfort, has upcoming appt with a 3rd specialist next week, and hopes to get some help, also has appt with pain management  From ophthalmologist , which I have advised her to keep  Nausea Ongoing daily nausea, precipitated by ongoing left eye pain and zofran prescribed for as needed use  Loss of taste Poor appetite with weight loss, trial of megace to assist, short term  Essential hypertension Controlled, no change in medication   Hyperlipidemia LDL goal <100 Hyperlipidemia:Low fat diet discussed and encouraged.   Lipid Panel  Lab Results  Component Value Date   CHOL 211* 12/02/2014   HDL 101 12/02/2014   LDLCALC 90 12/02/2014   TRIG 100 12/02/2014   CHOLHDL 2.1 12/02/2014   Reduction in fat intake needed, no change in management

## 2015-06-19 NOTE — Patient Instructions (Addendum)
F/u as before, please call if you need me sooner  New for nausea is zofran 4 mg one daily, as needed  New to help with appetite is megace 40 mg one daily , short term  I hope that your appt with eye specialist goes well  You are referred to dermatologist re skin lesions in particular the one on the right breast, important to keep this appt  Urine was not infected in June 16 visit

## 2015-06-19 NOTE — Assessment & Plan Note (Signed)
Dr Patrice Paradise to evaluate pt with chronic right eye pain for 8 months, had enucleation on 05/09/2015, still has the same pain rated at 2 to 3 more of a gritty feeling in socket

## 2015-06-20 ENCOUNTER — Other Ambulatory Visit: Payer: Self-pay

## 2015-06-20 MED ORDER — RAMIPRIL 5 MG PO CAPS: 5.0000 mg | ORAL_CAPSULE | Freq: Every day | ORAL | Status: DC

## 2015-06-27 ENCOUNTER — Telehealth: Payer: Self-pay | Admitting: Family Medicine

## 2015-06-28 NOTE — Telephone Encounter (Signed)
Spoke directly with the pt  Concern was about zofran not being covered, does not need anymore, has appt at [facility] for furhter eval by eye specialist, states megace doing well, pls resched mid to end August

## 2015-06-28 NOTE — Telephone Encounter (Signed)
Patient thought appointment was in July it is for September and so nothing was done with her appointment

## 2015-07-04 DIAGNOSIS — Z9889 Other specified postprocedural states: Secondary | ICD-10-CM | POA: Insufficient documentation

## 2015-07-04 DIAGNOSIS — H0530 Unspecified deformity of orbit: Secondary | ICD-10-CM | POA: Insufficient documentation

## 2015-07-04 DIAGNOSIS — H2512 Age-related nuclear cataract, left eye: Secondary | ICD-10-CM | POA: Insufficient documentation

## 2015-07-10 ENCOUNTER — Other Ambulatory Visit: Payer: Self-pay

## 2015-07-10 DIAGNOSIS — R1013 Epigastric pain: Secondary | ICD-10-CM

## 2015-07-16 NOTE — Assessment & Plan Note (Signed)
Ongoing daily nausea, precipitated by ongoing left eye pain and zofran prescribed for as needed use

## 2015-07-16 NOTE — Assessment & Plan Note (Signed)
Ongoing discomfort, has upcoming appt with a 3rd specialist next week, and hopes to get some help, also has appt with pain management  From ophthalmologist , which I have advised her to keep

## 2015-07-16 NOTE — Assessment & Plan Note (Signed)
Controlled, no change in medication  

## 2015-07-16 NOTE — Assessment & Plan Note (Signed)
Poor appetite with weight loss, trial of megace to assist, short term

## 2015-07-16 NOTE — Assessment & Plan Note (Signed)
Generalized lesions, however new right breast hyperpigmented lesion of concern, referred to dermatology for further eval

## 2015-07-16 NOTE — Assessment & Plan Note (Signed)
Hyperlipidemia:Low fat diet discussed and encouraged.   Lipid Panel  Lab Results  Component Value Date   CHOL 211* 12/02/2014   HDL 101 12/02/2014   LDLCALC 90 12/02/2014   TRIG 100 12/02/2014   CHOLHDL 2.1 12/02/2014   Reduction in fat intake needed, no change in management

## 2015-07-22 ENCOUNTER — Encounter (HOSPITAL_COMMUNITY): Payer: Self-pay

## 2015-07-22 ENCOUNTER — Emergency Department (HOSPITAL_COMMUNITY): Payer: PPO

## 2015-07-22 ENCOUNTER — Emergency Department (HOSPITAL_COMMUNITY)
Admission: EM | Admit: 2015-07-22 | Discharge: 2015-07-22 | Disposition: A | Discharge status: Home / Self Care | Payer: PPO | Attending: Emergency Medicine | Admitting: Emergency Medicine

## 2015-07-22 DIAGNOSIS — I10 Essential (primary) hypertension: Secondary | ICD-10-CM | POA: Diagnosis not present

## 2015-07-22 DIAGNOSIS — E785 Hyperlipidemia, unspecified: Secondary | ICD-10-CM | POA: Insufficient documentation

## 2015-07-22 DIAGNOSIS — Z87828 Personal history of other (healed) physical injury and trauma: Secondary | ICD-10-CM | POA: Insufficient documentation

## 2015-07-22 DIAGNOSIS — R0789 Other chest pain: Secondary | ICD-10-CM | POA: Insufficient documentation

## 2015-07-22 DIAGNOSIS — Z79899 Other long term (current) drug therapy: Secondary | ICD-10-CM | POA: Insufficient documentation

## 2015-07-22 DIAGNOSIS — K219 Gastro-esophageal reflux disease without esophagitis: Secondary | ICD-10-CM | POA: Insufficient documentation

## 2015-07-22 DIAGNOSIS — Z7982 Long term (current) use of aspirin: Secondary | ICD-10-CM | POA: Insufficient documentation

## 2015-07-22 DIAGNOSIS — R002 Palpitations: Secondary | ICD-10-CM | POA: Diagnosis present

## 2015-07-22 DIAGNOSIS — H5441 Blindness, right eye, normal vision left eye: Secondary | ICD-10-CM | POA: Diagnosis not present

## 2015-07-22 DIAGNOSIS — Q394 Esophageal web: Secondary | ICD-10-CM | POA: Insufficient documentation

## 2015-07-22 LAB — COMPREHENSIVE METABOLIC PANEL
ALT: 21 U/L (ref 14–54)
ANION GAP: 9 (ref 5–15)
AST: 28 U/L (ref 15–41)
Albumin: 4 g/dL (ref 3.5–5.0)
Alkaline Phosphatase: 105 U/L (ref 38–126)
BILIRUBIN TOTAL: 0.6 mg/dL (ref 0.3–1.2)
BUN: 13 mg/dL (ref 6–20)
CO2: 26 mmol/L (ref 22–32)
Calcium: 9.1 mg/dL (ref 8.9–10.3)
Chloride: 103 mmol/L (ref 101–111)
Creatinine, Ser: 0.86 mg/dL (ref 0.44–1.00)
GFR calc non Af Amer: >60 mL/min (ref >60)
GLUCOSE: 108 mg/dL — AB (ref 65–99)
POTASSIUM: 3.7 mmol/L (ref 3.5–5.1)
Sodium: 138 mmol/L (ref 135–145)
Total Protein: 6.8 g/dL (ref 6.5–8.1)

## 2015-07-22 LAB — CBC WITH DIFFERENTIAL/PLATELET
Basophils Absolute: 0 10*3/uL (ref 0.0–0.1)
Basophils Relative: 0 % (ref 0–1)
EOS PCT: 2 % (ref 0–5)
Eosinophils Absolute: 0.1 10*3/uL (ref 0.0–0.7)
HCT: 39.6 % (ref 36.0–46.0)
HEMOGLOBIN: 13.2 g/dL (ref 12.0–15.0)
LYMPHS ABS: 1.6 10*3/uL (ref 0.7–4.0)
Lymphocytes Relative: 31 % (ref 12–46)
MCH: 30.7 pg (ref 26.0–34.0)
MCHC: 33.3 g/dL (ref 30.0–36.0)
MCV: 92.1 fL (ref 78.0–100.0)
MONO ABS: 0.5 10*3/uL (ref 0.1–1.0)
Monocytes Relative: 10 % (ref 3–12)
Neutro Abs: 2.9 10*3/uL (ref 1.7–7.7)
Neutrophils Relative %: 57 % (ref 43–77)
Platelets: 280 10*3/uL (ref 150–400)
RBC: 4.3 MIL/uL (ref 3.87–5.11)
RDW: 13.4 % (ref 11.5–15.5)
WBC: 5.1 10*3/uL (ref 4.0–10.5)

## 2015-07-22 LAB — MAGNESIUM: Magnesium: 2.1 mg/dL (ref 1.7–2.4)

## 2015-07-22 LAB — TROPONIN I: Troponin I: <0.03 ng/mL (ref <0.031)

## 2015-07-22 MED ORDER — NITROGLYCERIN 2 % TD OINT
1.0000 [in_us] | TOPICAL_OINTMENT | Freq: Once | TRANSDERMAL | Status: AC
  Administered 2015-07-22: 1 [in_us] via TOPICAL
  Filled 2015-07-22: qty 1

## 2015-07-22 MED ORDER — SODIUM CHLORIDE 0.9 % IV BOLUS (SEPSIS)
500.0000 mL | Freq: Once | INTRAVENOUS | Status: AC
  Administered 2015-07-22: 500 mL via INTRAVENOUS

## 2015-07-22 MED ORDER — ONDANSETRON HCL 4 MG/2ML IJ SOLN
4.0000 mg | Freq: Once | INTRAMUSCULAR | Status: AC
  Administered 2015-07-22: 4 mg via INTRAVENOUS
  Filled 2015-07-22: qty 2

## 2015-07-22 MED ORDER — SODIUM CHLORIDE 0.9 % IV SOLN
INTRAVENOUS | Status: DC
  Administered 2015-07-22: 07:00:00 via INTRAVENOUS

## 2015-07-22 MED ORDER — ACETAMINOPHEN 325 MG PO TABS
650.0000 mg | ORAL_TABLET | Freq: Once | ORAL | Status: AC
  Administered 2015-07-22: 650 mg via ORAL
  Filled 2015-07-22: qty 2

## 2015-07-22 NOTE — Discharge Instructions (Signed)
Call Cardiology office at number above to make appointment. You may need to wear a portable heart monitor called a "Holter Monitor" to catch the cause for the palpitations.   Palpitations A palpitation is the feeling that your heartbeat is irregular or is faster than normal. It may feel like your heart is fluttering or skipping a beat. Palpitations are usually not a serious problem. However, in some cases, you may need further medical evaluation. CAUSES  Palpitations can be caused by:  Smoking.  Caffeine or other stimulants, such as diet pills or energy drinks.  Alcohol.  Stress and anxiety.  Strenuous physical activity.  Fatigue.  Certain medicines.  Heart disease, especially if you have a history of irregular heart rhythms (arrhythmias), such as atrial fibrillation, atrial flutter, or supraventricular tachycardia.  An improperly working pacemaker or defibrillator. DIAGNOSIS  To find the cause of your palpitations, your health care provider will take your medical history and perform a physical exam. Your health care provider may also have you take a test called an ambulatory electrocardiogram (ECG). An ECG records your heartbeat patterns over a 24-hour period. You may also have other tests, such as:  Transthoracic echocardiogram (TTE). During echocardiography, sound waves are used to evaluate how blood flows through your heart.  Transesophageal echocardiogram (TEE).  Cardiac monitoring. This allows your health care provider to monitor your heart rate and rhythm in real time.  Holter monitor. This is a portable device that records your heartbeat and can help diagnose heart arrhythmias. It allows your health care provider to track your heart activity for several days, if needed.  Stress tests by exercise or by giving medicine that makes the heart beat faster. TREATMENT  Treatment of palpitations depends on the cause of your symptoms and can vary greatly. Most cases of  palpitations do not require any treatment other than time, relaxation, and monitoring your symptoms. Other causes, such as atrial fibrillation, atrial flutter, or supraventricular tachycardia, usually require further treatment. HOME CARE INSTRUCTIONS   Avoid:  Caffeinated coffee, tea, soft drinks, diet pills, and energy drinks.  Chocolate.  Alcohol.  Stop smoking if you smoke.  Reduce your stress and anxiety. Things that can help you relax include:  A method of controlling things in your body, such as your heartbeats, with your mind (biofeedback).  Yoga.  Meditation.  Physical activity such as swimming, jogging, or walking.  Get plenty of rest and sleep. SEEK MEDICAL CARE IF:   You continue to have a fast or irregular heartbeat beyond 24 hours.  Your palpitations occur more often. SEEK IMMEDIATE MEDICAL CARE IF:  You have chest pain or shortness of breath.  You have a severe headache.  You feel dizzy or you faint. MAKE SURE YOU:  Understand these instructions.  Will watch your condition.  Will get help right away if you are not doing well or get worse. Document Released: 12/06/2000 Document Revised: 12/14/2013 Document Reviewed: 02/07/2012 [facility] Patient Information 2015 [place], [place]. This information is not intended to replace advice given to you by your health care provider. Make sure you discuss any questions you have with your health care provider.

## 2015-07-22 NOTE — ED Notes (Addendum)
Pt called out stating that she was having "another episode." Pt states she begins to feel very warm, it builds up, then goes away. States this episode lasted less than 1 minute. No changes to pt's vital signs. No rhythm changes noted. Pt AOx4. Pt skin is warm and dry. Denies CP.

## 2015-07-22 NOTE — ED Notes (Signed)
MD Jeneen Rinks at bedside updating patient and family.

## 2015-07-22 NOTE — ED Provider Notes (Signed)
CSN: [id number]     Arrival date & time 07/22/15  0624 History   First MD Initiated Contact with Patient 07/22/15 [phone]     Chief Complaint  Patient presents with  . Palpitations     (Consider location/radiation/quality/duration/timing/severity/associated sxs/prior Treatment) HPI  Patient states yesterday afternoon she was just sitting doing nothing and felt like her heart was pounding. It made her get a hot feeling all the way down her arms and legs and all over. She states she does not have chest pain but she has chest tightness and indicates her left chest. When she has it, it makes her feel short of breath and she gets sweaty. She also has nausea without vomiting. She states the episode last about a minute and she is having 3-4 episodes an hour. She states the last episode was at 6 AM this morning. She states she has been having these episodes about once a month. She initially told me she had discussed this with Dr. Moshe Cipro and she was referred to a specialist, however she then told me she was referred to a specialist for her chronic nausea which she states is improving. She states she's never seen a cardiologist. Looking at her medications I do not see any rate controlling medications on her drug list.  PCP Dr Moshe Cipro  Past Medical History  Diagnosis Date  . Diverticulosis     [place]  . Hyperlipemia   . HTN (hypertension)   . Functional constipation 2010 INTERMITTENT  . Blindness of right eye   . BMI between 19-24,adult 2010 145 LBS  . Schatzki's ring DEC 2010  . Diverticula of small intestine DEC 2010 TWO  . Right eye trauma 1949    loss of vision resulted at age 14  . GERD (gastroesophageal reflux disease)    Past Surgical History  Procedure Laterality Date  . Total abdominal hysterectomy w/ bilateral salpingoophorectomy  1985 FIBROIDS  . Eye surgery  RIGHT 2005  . Breast reduction surgery  1992 BIL  . Colonoscopy  DEC 2010    SIMPLE ADENOMAS (6-8), [place] TICS, SML IH  . Upper  gastrointestinal endoscopy  DEC 2010    Bx-MILD GASTRITIS/DUODENITIS, DUODENAL DIVERTICULA, SCHATZKI'S RING  . Abdominal hysterectomy    . Colonoscopy N/A 12/08/2013    Procedure: COLONOSCOPY;  Surgeon: Rogene Houston, MD;  [facility];  Service: Endoscopy;  Laterality: N/A;  225  . Evisceration Right 05/08/2015    Procedure: EVISCERATION WITH IMPLANT RIGHT EYE;  Surgeon: Williams Che, MD;  Location: [facility] ORS;  Service: Ophthalmology;  Laterality: Right;   Family History  Problem Relation Age of Onset  . Pancreatic cancer Sister   . Colon cancer Paternal Aunt   . Colon polyps Neg Hx    History  Substance Use Topics  . Smoking status: Never Smoker   . Smokeless tobacco: Never Used  . Alcohol Use: No   Lives at home  Lives with spouse  OB History    No data available     Review of Systems  All other systems reviewed and are negative.     Allergies  Codeine and Fosamax  Home Medications   Prior to Admission medications   Medication Sig Start Date End Date Taking? Authorizing Provider  aspirin 81 MG tablet Take 81 mg by mouth daily.    Yes Historical Provider, MD  ramipril (ALTACE) 5 MG capsule Take 1 capsule (5 mg total) by mouth daily. 06/20/15  Yes Fayrene Helper, MD  bimatoprost (LUMIGAN) 0.01 % SOLN Place 1 drop into the right eye at bedtime.    Historical Provider, MD  Calcium Carbonate-Vitamin D (CALCIUM-VITAMIN D3) 600-200 MG-UNIT TABS Take 1 tablet by mouth daily.     Historical Provider, MD  cholecalciferol (VITAMIN D) 1000 UNITS tablet Take 1,000 Units by mouth daily.    Historical Provider, MD  gabapentin (NEURONTIN) 100 MG capsule Three capsules at bedtime for right eye o pain 06/05/15   Fayrene Helper, MD  magnesium gluconate (MAGONATE) 500 MG tablet Take 500 mg by mouth daily.     Historical Provider, MD  megestrol (MEGACE) 40 MG tablet Take 1 tablet (40 mg total) by mouth daily. 06/19/15   Fayrene Helper, MD  omeprazole (PRILOSEC) 20  MG capsule TAKE 1 CAPSULE DAILY Patient not taking: Reported on 12/05/2014 07/28/14   Fayrene Helper, MD  ondansetron [facility]) 4 MG tablet Take 1 tablet (4 mg total) by mouth daily as needed for nausea or vomiting. 06/19/15   Fayrene Helper, MD  pravastatin (PRAVACHOL) 20 MG tablet TAKE 1 TABLET DAILY Patient not taking: Reported on 06/05/2015 04/27/15   Fayrene Helper, MD  predniSONE (DELTASONE) 5 MG tablet Take 1 tablet (5 mg total) by mouth 2 (two) times daily with a meal. Patient not taking: Reported on 06/19/2015 06/13/15   Fayrene Helper, MD  risedronate (ACTONEL) 35 MG tablet TAKE 1 TABLET EVERY 7 DAYS WITH WATER ON AN EMPTY STOMACH, TAKE NOTHING BY MOUTH OR LIE DOWN FOR NEXT 30 MINUTES Patient not taking: Reported on 06/05/2015 05/11/15   Fayrene Helper, MD  sertraline (ZOLOFT) 50 MG tablet TAKE 1 TABLET DAILY Patient not taking: Reported on 06/05/2015 12/05/14   Fayrene Helper, MD  vitamin B-12 (CYANOCOBALAMIN) 100 MCG tablet Take 100 mcg by mouth daily.    Historical Provider, MD   BP 165/80 mmHg  Pulse 80  Temp(Src) 97.9 F (36.6 C) (Oral)  Resp 14  Ht 5\' 5"  (1.651 m)  Wt 130 lb (58.968 kg)  BMI 21.63 kg/m2  SpO2 98%  Vital signs normal   Physical Exam  Constitutional: She is oriented to person, place, and time. She appears well-developed and well-nourished.  Non-toxic appearance. She does not appear ill. No distress.  HENT:  Head: Normocephalic and atraumatic.  Right Ear: External ear normal.  Left Ear: External ear normal.  Nose: Nose normal. No mucosal edema or rhinorrhea.  Mouth/Throat: Oropharynx is clear and moist and mucous membranes are normal. No dental abscesses or uvula swelling.  Eyes:  Patient's right globe is opaque. Left pupil is reactive to light.  Neck: Normal range of motion and full passive range of motion without pain. Neck supple.  Cardiovascular: Normal rate, regular rhythm and normal heart sounds.  Exam reveals no gallop and no  friction rub.   No murmur heard. Pulmonary/Chest: Effort normal and breath sounds normal. No respiratory distress. She has no wheezes. She has no rhonchi. She has no rales. She exhibits no tenderness and no crepitus.    Area of chest tightness noted  Abdominal: Soft. Normal appearance and bowel sounds are normal. She exhibits no distension. There is no tenderness. There is no rebound and no guarding.  Musculoskeletal: Normal range of motion. She exhibits no edema or tenderness.  Moves all extremities well.   Neurological: She is alert and oriented to person, place, and time. She has normal strength. No cranial nerve deficit.  Skin: Skin is warm, dry and intact. No rash noted.  No erythema. No pallor.  Psychiatric: She has a normal mood and affect. Her speech is normal and behavior is normal. Her mood appears not anxious.  Nursing note and vitals reviewed.   ED Course  Procedures (including critical care time)  Medications  0.9 %  sodium chloride infusion ( Intravenous New Bag/Given 07/22/15 0709)  sodium chloride 0.9 % bolus 500 mL (500 mLs Intravenous New Bag/Given 07/22/15 0709)  ondansetron (ZOFRAN) injection 4 mg (4 mg Intravenous Given 07/22/15 0709)  nitroGLYCERIN (NITROGLYN) 2 % ointment 1 inch (1 inch Topical Given 07/22/15 0709)  acetaminophen (TYLENOL) tablet 650 mg (650 mg Oral Given 07/22/15 0709)   Pt c/o dry mouth, she was allowed to drink water and was given some IV fluids. She was given zofran for nausea and acetaminophen for anticipated HA from NTG ointment.   07:14 Pt was turned over to Dr Jeneen Rinks at change of shift to get her lab results, xray results and to hopefully capture her palpitations on her cardiac monitor.    Labs Review pending    Imaging Review No results found.   EKG Interpretation   Date/Time:  Saturday July 22 2015 06:35:10 EDT Ventricular Rate:  79 PR Interval:  169 QRS Duration: 92 QT Interval:  410 QTC Calculation: 470 R Axis:   45 Text  Interpretation:  Sinus rhythm Minimal ST depression, diffuse leads  Baseline wander in lead(s) V3 Since last tracing 04 May 2015 U waves  present Confirmed by Daivik Overley  MD-I, Talon Regala ([id number]) on 07/22/2015 6:48:36 AM      MDM   Final diagnoses:  Palpitations  Chest tightness   Disposition pending  Rolland Porter, MD, Barbette Or, MD 07/22/15 [phone]

## 2015-07-22 NOTE — ED Provider Notes (Signed)
Patient remains asymptomatic. No ectopy while here. On 2 rechecks she continues to feel well and states quite; home". Plan is home with card allergy referral to discuss Holter monitor. Return here with worsening or recurrence of symptoms.  Tanna Furry, MD 07/22/15 [phone]

## 2015-07-22 NOTE — ED Notes (Signed)
Pt states she has been feeling clammy and sweaty along with feeling like her heart is racing.  This started last night and has continued until now.  Pt denies pain, but is sob and nauseated.

## 2015-07-27 ENCOUNTER — Encounter: Payer: Self-pay | Admitting: Cardiology

## 2015-07-27 ENCOUNTER — Ambulatory Visit (INDEPENDENT_AMBULATORY_CARE_PROVIDER_SITE_OTHER): Payer: PPO | Admitting: Cardiology

## 2015-07-27 ENCOUNTER — Ambulatory Visit (INDEPENDENT_AMBULATORY_CARE_PROVIDER_SITE_OTHER): Payer: PPO

## 2015-07-27 VITALS — BP 128/82 | HR 94 | Ht 65.0 in | Wt 125.4 lb

## 2015-07-27 DIAGNOSIS — R072 Precordial pain: Secondary | ICD-10-CM

## 2015-07-27 DIAGNOSIS — I10 Essential (primary) hypertension: Secondary | ICD-10-CM

## 2015-07-27 DIAGNOSIS — R002 Palpitations: Secondary | ICD-10-CM

## 2015-07-27 NOTE — Patient Instructions (Signed)
Your physician recommends that you schedule a follow-up appointment in: TO BE DETERMINED  Your physician recommends that you continue on your current medications as directed. Please refer to the Current Medication list given to you today.  Your physician has recommended that you wear an event monitor. Event monitors are medical devices that record the heart's electrical activity. Doctors most often [place] these monitors to diagnose arrhythmias. Arrhythmias are problems with the speed or rhythm of the heartbeat. The monitor is a small, portable device. You can wear one while you do your normal daily activities. This is usually used to diagnose what is causing palpitations/syncope (passing out).  WILL CALL WITH RESULTS FROM EVENT MONITOR  Thanks for choosing  [facility]!!!

## 2015-07-27 NOTE — Progress Notes (Signed)
Cardiology Office Note  Date: 07/27/2015   ID: Kelsey Nelson, DOB 05-04-1934, MRN [id number]  PCP: Tula Nakayama, MD  Consulting Cardiologist: Rozann Lesches, MD   Chief Complaint  Patient presents with  . Chest discomfort  . Palpitations    History of Present Illness: Kelsey Nelson is a 81 y.o. female referred for cardiology consultation by Dr. Tanna Furry after recent ER visit. Her primary care physician is Dr. Moshe Cipro. She reports at least a one-year history of recurring chest discomfort described as a "burning" in her left retrosternal area. This typically occurs after she gets up early in the morning around 5:30, followed by a feeling of heat all over her body, and then palpitations as well as nausea. Symptoms can last for hours at a time, are otherwise somewhat improved by eating or drinking. These symptoms are nonexertional. She does try to get out and walk, sometimes up to 3 miles at a time at least a few days a week, and does not have the symptoms with this activity.  I review her history, she has prior documented Schatzki's ring, EGD back in 2010. Also GERD. She is followed by Dr. Laural Golden. She does not report any active dysphasia now, but continues to have indigestion.  She stopped all of her regular medications back in May with the exception of Altace, also now takes phytoestrogen and a bone nutrient supplement over-the-counter. She tells me that she did this because she was feeling bad after having eye surgery back in May.  I reviewed her recent ECGs, she has no history of arrhythmia. Recent lab work and chest x-ray are outlined below.   Past Medical History  Diagnosis Date  . Diverticulosis     [place]  . Hyperlipemia   . HTN (hypertension)   . Functional constipation 2010  . Blindness of right eye   . BMI between 19-24,adult 2010 145 LBS  . Schatzki's ring Dec 2010  . Diverticula of small intestine Dec 2010  . Right eye trauma 1949    Loss of vision resulted at age  69  . GERD (gastroesophageal reflux disease)     Past Surgical History  Procedure Laterality Date  . Total abdominal hysterectomy w/ bilateral salpingoophorectomy  1985 FIBROIDS  . Eye surgery  RIGHT 2005  . Breast reduction surgery  1992 BIL  . Colonoscopy  DEC 2010    SIMPLE ADENOMAS (6-8), [place] TICS, SML IH  . Upper gastrointestinal endoscopy  DEC 2010    Bx-MILD GASTRITIS/DUODENITIS, DUODENAL DIVERTICULA, SCHATZKI'S RING  . Abdominal hysterectomy    . Colonoscopy N/A 12/08/2013    Procedure: COLONOSCOPY;  Surgeon: Rogene Houston, MD;  [facility];  Service: Endoscopy;  Laterality: N/A;  225  . Evisceration Right 05/08/2015    Procedure: EVISCERATION WITH IMPLANT RIGHT EYE;  Surgeon: Williams Che, MD;  Location: [facility] OR[facility];  Service: Ophthalmology;  Laterality: Right;    Current Outpatient Prescriptions  Medication Sig Dispense Refill  . aspirin 81 MG tablet Take 81 mg by mouth daily.     . ramipril (ALTACE) 5 MG capsule Take 1 capsule (5 mg total) by mouth daily. 90 capsule 1   No current facility-administered medications for this visit.    Allergies:  Codeine and Fosamax   Social History: The patient  reports that she has never smoked. She has never used smokeless tobacco. She reports that she does not drink alcohol or use illicit drugs.   Family History: The patient's family history  includes Colon cancer in her paternal aunt; Pancreatic cancer in her sister. There is no history of Colon polyps.   ROS:  Please see the history of present illness. Otherwise, complete review of systems is positive for indigestion.  All other systems are reviewed and negative.   Physical Exam: VS:  BP 128/82 mmHg  Pulse 94  Ht 5\' 5"  (1.651 m)  Wt 125 lb 6.4 oz (56.881 kg)  BMI 20.87 kg/m2  SpO2 93%, BMI Body mass index is 20.87 kg/(m^2).  Wt Readings from Last 3 Encounters:  07/27/15 125 lb 6.4 oz (56.881 kg)  07/22/15 130 lb (58.968 kg)  06/19/15 127 lb (57.607 kg)      General: Patient appears comfortable at rest. HEENT: Right eye with limited lid excursion, oropharynx clear. Neck: Supple, no elevated JVP or carotid bruits, no thyromegaly. Lungs: Clear to auscultation, nonlabored breathing at rest. Cardiac: Regular rate and rhythm, no S3 or significant systolic murmur, no pericardial rub. Abdomen: Soft, nontender, bowel sounds present, no guarding or rebound. Extremities: No pitting edema, distal pulses 2+. Skin: Warm and dry. Musculoskeletal: No kyphosis. Neuropsychiatric: Alert and oriented x3, affect grossly appropriate.   ECG: Tracing from 07/22/2015 showed sinus rhythm with nonspecific ST-T changes.  Recent Labwork: 07/22/2015: ALT 21; AST 28; BUN 13; Creatinine, Ser 0.86; Hemoglobin 13.2; Magnesium 2.1; Platelets 280; Potassium 3.7; Sodium 138, troponin I less than 0.03    Component Value Date/Time   CHOL 211* 12/02/2014 0801   TRIG 100 12/02/2014 0801   HDL 101 12/02/2014 0801   CHOLHDL 2.1 12/02/2014 0801   VLDL 20 12/02/2014 0801   Prien 90 12/02/2014 0801    Other Studies Reviewed Today:  Chest x-ray 07/22/2015: FINDINGS: The cardiomediastinal silhouette is unremarkable.  There is no evidence of focal airspace disease, pulmonary edema, suspicious pulmonary nodule/mass, pleural effusion, or pneumothorax. No acute bony abnormalities are identified.  IMPRESSION: No active disease.   ASSESSMENT AND PLAN:  1. Atypical chest discomfort, sounds more gastrointestinal in etiology based on description, although she does report some palpitations associated with this as well. Recent ECG, normal troponin I, and chest x-ray reviewed. We will obtain a seven-day cardiac monitor to exclude any potential associated arrhythmias. If this is reassuring, suggest follow-up with Dr. Laural Golden for consideration of EGD.  2. Essential hypertension, on Altace.  3. Medication noncompliance. She stopped all of her regular medications with the exception  of Altace back in May. I have asked her to follow-up with Dr. Moshe Cipro to discuss this further.  Current medicines were reviewed at length with the patient today.   Orders Placed This Encounter  Procedures  . Cardiac event monitor    Disposition: Call with results.   Signed, Satira Sark, MD, [facility] 07/27/2015 11:24 AM    [place] [place]. [address] [place], [place] [address] Phone: [phone]; Fax: [phone]

## 2015-07-28 ENCOUNTER — Telehealth: Payer: Self-pay

## 2015-07-28 ENCOUNTER — Other Ambulatory Visit: Payer: Self-pay

## 2015-07-28 DIAGNOSIS — K219 Gastro-esophageal reflux disease without esophagitis: Secondary | ICD-10-CM

## 2015-07-28 DIAGNOSIS — R110 Nausea: Secondary | ICD-10-CM

## 2015-07-28 MED ORDER — RAMIPRIL 5 MG PO CAPS: 5.0000 mg | ORAL_CAPSULE | Freq: Every day | ORAL | Status: DC

## 2015-07-28 NOTE — Telephone Encounter (Signed)
I spoke directly to the pt and encouraged resume omeprazole 20 mg one twice daily I am referring her to Dr Laural Golden   Please refill her ramipril, she needs this

## 2015-07-28 NOTE — Telephone Encounter (Signed)
Patient called and said she has been to the ER and wants you to review her note. They mentioned the heat feeling in her chest may be coming from her esophagus. She wants to be referred to GI and also wanted to talk to you about whats going on. She saw cardiology yesterday and they gave her a heart monitor and she wanted me to show her how to use it. I kindly told her that cardiology would be the ones to show her that and I offered to call the nurse for her and explain the situation but she declined. I offered her an appt Monday- she declined. Wants to speak to Dr to ease her mind. Please advise  (doesn't want her daughter to know)

## 2015-07-28 NOTE — Telephone Encounter (Signed)
Called back and said she has her monitor figured out but needs gi referral

## 2015-07-31 ENCOUNTER — Encounter (INDEPENDENT_AMBULATORY_CARE_PROVIDER_SITE_OTHER): Payer: Self-pay | Admitting: Internal Medicine

## 2015-07-31 ENCOUNTER — Ambulatory Visit (INDEPENDENT_AMBULATORY_CARE_PROVIDER_SITE_OTHER): Payer: PPO | Admitting: Internal Medicine

## 2015-07-31 VITALS — BP 130/80 | HR 82 | Temp 98.4°F | Resp 18 | Ht 65.5 in | Wt 127.1 lb

## 2015-07-31 DIAGNOSIS — R634 Abnormal weight loss: Secondary | ICD-10-CM

## 2015-07-31 DIAGNOSIS — R630 Anorexia: Secondary | ICD-10-CM | POA: Diagnosis not present

## 2015-07-31 DIAGNOSIS — R110 Nausea: Secondary | ICD-10-CM | POA: Diagnosis not present

## 2015-07-31 MED ORDER — MIRTAZAPINE 15 MG PO TABS: 7.5000 mg | ORAL_TABLET | Freq: Every day | ORAL | Status: DC

## 2015-07-31 NOTE — Patient Instructions (Signed)
Physician will call with results of blood tests when completed. Notify if you experience any side effects with mirtazapine.

## 2015-07-31 NOTE — Progress Notes (Signed)
Presenting complaint;  Nausea and poor appetite.  Subjective:  Patient is 81 year old Caucasian female who returns for reevaluation for nausea and now she has poor appetite. She has had problems with nausea for a few years and has been thoroughly evaluated over 3 years ago. She states she was doing much better until she had eye surgery on 05/15/2015. She states she had constant sensation of foreign body in her right eye and no treatment worked. She was seen at [facility] She elected to proceed with evisceration of right eye with implant performed by Dr. Iona Hansen on 05/15/2015. She states she had excruciating pain for 9 days and was not able to eat and had constipated. Pain has eventually resolved and her bowels are moving daily but nausea is back. She also has no appetite and has lost 8 pounds. She states she did feel hungry last night and was able to eat at [place]. This is the first time she had an appetite in the last 10 weeks. She also complains of having hot sensation behind her left breast and then did sensation spreads to rest of her body. She's been told that this could be equivalent of heartburn and or hot flash. She denies fever or chills vomiting melena or rectal bleeding. Patient was seen in emergency room on 07/22/2015 for palpitation and workup in emergency room was unremarkable and she was discharged. She has even monitor which will come off this week. She had EGD in May 2012 revealing gastroduodenitis but there was no evidence of peptic ulcer disease. Gastric biopsy revealed erosive gastritis but H. pylori stains were negative. She was treated for diverticulitis in November 2014. She had colonoscopy in December 2014 revealing multiple diverticula at sigmoid colon small external hemorrhoids and single polyp which turned out to be tubular adenoma.    Current Medications: Outpatient Encounter Prescriptions as of 07/31/2015  Medication Sig  . diazepam (VALIUM) 5 MG tablet Take 2.5 mg by mouth  at bedtime.  . diphenhydrAMINE (BENADRYL) 25 mg capsule Take 25 mg by mouth at bedtime.  . Multiple Vitamin (MULTI-VITAMIN DAILY PO) Take by mouth daily. doTerra Women -  Bone Nutireint  . NON FORMULARY doTerra Women - Phytoestrogen - Patient states that she takes 2 by mouth daily.  . NON FORMULARY doTerra - Digestzen oil - a couple drops at bedtime Alternates with the Lemon Oil- a couple of drops at night.  [name] omeprazole (PRILOSEC) 20 MG capsule Take 20 mg by mouth as needed.  [name] OVER THE COUNTER MEDICATION Stool Softner with stimulant laxative - Docusate Sodium 50 mg Sennosides 8.6 mg - 1 by mouth daily.  . ramipril (ALTACE) 5 MG capsule Take 1 capsule (5 mg total) by mouth daily.  [name] aspirin 81 MG tablet Take 81 mg by mouth daily.    No facility-administered encounter medications on file as of 07/31/2015.     Objective: Blood pressure 130/80, pulse 82, temperature 98.4 F (36.9 C), temperature source Oral, resp. rate 18, height 5' 5.5" (1.664 m), weight 127 lb 1.6 oz (57.652 kg). Patient is alert and in no acute distress. She has prosthetic right eye. Conjunctiva is pink. Sclera is nonicteric Oropharyngeal mucosa is normal. No neck masses or thyromegaly noted. Cardiac exam with regular rhythm normal S1 and S2. No murmur or gallop noted. Lungs are clear to auscultation. Abdomen is symmetrical soft and nontender without organomegaly or masses. No LE edema or clubbing noted.  Labs/studies Results: Lab data from 07/22/2015  WBC 5.1, H&H 13.2 and 39.6 and  platelet count  280 K  Serum sodium 138 potassium 3.7, chloride 103, CO2 26, BUN 13, creatinine 0.86  Glucose 108  Bilirubin 0.6, AP 105, AST 28, ALT 21, total protein 6.8 and albumen 4.0 and calcium 9.1.    Assessment:  #1. Nausea without vomiting associated with anorexia and weight loss most likely central in origin or due to depression triggered by acute illness. She has history of nausea and underwent extensive evaluation over 3  years ago. Will also rule out Addison's disease given her symptoms. #2. GERD. She is taking omeprazole on as-needed basis with satisfactory control of her heartburn.   Plan:  Fasting cortisol level. Mirtazapine 7.5 mg by mouth daily at bedtime. Office visit in one month.

## 2015-08-04 LAB — CORTISOL: Cortisol, Plasma: 22.8 ug/dL

## 2015-09-04 ENCOUNTER — Ambulatory Visit (INDEPENDENT_AMBULATORY_CARE_PROVIDER_SITE_OTHER): Payer: PPO | Admitting: Internal Medicine

## 2015-09-04 ENCOUNTER — Encounter (INDEPENDENT_AMBULATORY_CARE_PROVIDER_SITE_OTHER): Payer: Self-pay | Admitting: Internal Medicine

## 2015-09-04 VITALS — BP 126/70 | HR 64 | Temp 98.0°F | Ht 65.5 in | Wt 128.8 lb

## 2015-09-04 DIAGNOSIS — R110 Nausea: Secondary | ICD-10-CM

## 2015-09-04 NOTE — Progress Notes (Signed)
Subjective:    Patient ID: Kelsey Nelson, female    DOB: [birth year], 81 y.o.   MRN: [id number]  HPI Here today for f/u. She saw Dr Laural Golden last month for nausea and poor appetite. She has had nausea for a few year and has been evaluated thoroughly over 3 years ago by Dr. Laural Golden . Hx of evisceration of rt eye with implant performed by Dr. Iona Hansen 05/15/2015. Weight 127. Today her weight is 128.8. She states she feels much better. Her appetite has improved. She has gained almost 2 pounds.  She was started on Remeron by Dr Laural Golden lat visit and she says it has helped her. She feels more happier since her eye surgery.  Usually has a BM daily with the use of a stool softener. She had EGD in May 2012 revealing gastroduodenitis but there was no evidence of peptic ulcer disease. Gastric biopsy revealed erosive gastritis but H. pylori stains were negative. She tells me she is 100% better. She rarely has nausea now.  She walks every day 3 miles.    She was treated for diverticulitis in November 2014. She had colonoscopy in December 2014 revealing multiple diverticula at sigmoid colon small external hemorrhoids and single polyp which turned out to be tubular adenoma.  08/03/2015 Cortisol  22.8  CBC    Component Value Date/Time   WBC 5.1 07/22/2015 0702   RBC 4.30 07/22/2015 0702   HGB 13.2 07/22/2015 0702   HCT 39.6 07/22/2015 0702   PLT 280 07/22/2015 0702   MCV 92.1 07/22/2015 0702   MCH 30.7 07/22/2015 0702   MCHC 33.3 07/22/2015 0702   RDW 13.4 07/22/2015 0702   LYMPHSABS 1.6 07/22/2015 0702   MONOABS 0.5 07/22/2015 0702   EOSABS 0.1 07/22/2015 0702   BASOSABS 0.0 07/22/2015 0702   CMP Latest Ref Rng 07/22/2015 05/04/2015 12/02/2014  Glucose 65 - 99 mg/dL 108(H) 113(H) 93  BUN 6 - 20 mg/dL 13 19 18   Creatinine 0.44 - 1.00 mg/dL 0.86 0.88 0.98  Sodium 135 - 145 mmol/L 138 139 141  Potassium 3.5 - 5.1 mmol/L 3.7 4.1 4.7  Chloride 101 - 111 mmol/L 103 103 103  CO2 22 - 32 mmol/L 26 27 28     Calcium 8.9 - 10.3 mg/dL 9.1 9.9 9.4  Total Protein 6.5 - 8.1 g/dL 6.8 - 6.8  Total Bilirubin 0.3 - 1.2 mg/dL 0.6 - 0.5  Alkaline Phos 38 - 126 U/L 105 - 110  AST 15 - 41 U/L 28 - 25  ALT 14 - 54 U/L 21 - 20       Review of Systems Past Medical History  Diagnosis Date  . Diverticulosis     [place]  . Hyperlipemia   . HTN (hypertension)   . Functional constipation 2010  . Blindness of right eye   . BMI between 19-24,adult 2010 145 LBS  . Schatzki's ring Dec 2010  . Diverticula of small intestine Dec 2010  . Right eye trauma 1949    Loss of vision resulted at age 12  . GERD (gastroesophageal reflux disease)     Past Surgical History  Procedure Laterality Date  . Total abdominal hysterectomy w/ bilateral salpingoophorectomy  1985 FIBROIDS  . Eye surgery  RIGHT 2005  . Breast reduction surgery  1992 BIL  . Colonoscopy  DEC 2010    SIMPLE ADENOMAS (6-8), [place] TICS, SML IH  . Upper gastrointestinal endoscopy  DEC 2010    Bx-MILD GASTRITIS/DUODENITIS, DUODENAL DIVERTICULA, SCHATZKI'S RING  .  Abdominal hysterectomy    . Colonoscopy N/A 12/08/2013    Procedure: COLONOSCOPY;  Surgeon: Rogene Houston, MD;  Location: [facility] ENDO SU[facility];  Service: Endoscopy;  Laterality: N/A;  225  . Evisceration Right 05/08/2015    Procedure: EVISCERATION WITH IMPLANT RIGHT EYE;  Surgeon: Williams Che, MD;  Location: [facility] ORS;  Service: Ophthalmology;  Laterality: Right;    Allergies  Allergen Reactions  . Codeine   . Fosamax [Alendronate Sodium]     heartburn    Current Outpatient Prescriptions on File Prior to Visit  Medication Sig Dispense Refill  . aspirin 81 MG tablet Take 81 mg by mouth daily.     . diazepam (VALIUM) 5 MG tablet Take 2.5 mg by mouth at bedtime.    . diphenhydrAMINE (BENADRYL) 25 mg capsule Take 25 mg by mouth at bedtime.    . mirtazapine (REMERON) 15 MG tablet Take 0.5 tablets (7.5 mg total) by mouth at bedtime. 15 tablet 2  . NON FORMULARY doTerra Women - Phytoestrogen -  Patient states that she takes 2 by mouth daily.    . NON FORMULARY doTerra - Digestzen oil - a couple drops at bedtime Alternates with the Lemon Oil- a couple of drops at night.    [name] omeprazole (PRILOSEC) 20 MG capsule Take 20 mg by mouth as needed.    [name] OVER THE COUNTER MEDICATION Stool Softner with stimulant laxative - Docusate Sodium 50 mg Sennosides 8.6 mg - 1 by mouth daily.    . ramipril (ALTACE) 5 MG capsule Take 1 capsule (5 mg total) by mouth daily. 90 capsule 1  . Multiple Vitamin (MULTI-VITAMIN DAILY PO) Take by mouth daily. doTerra Women -  Bone Nutireint     No current facility-administered medications on file prior to visit.        Objective:   Physical Exam Blood pressure 126/70, pulse 64, temperature 98 F (36.7 C), height 5' 5.5" (1.664 m), weight 128 lb 12.8 oz (58.423 kg).  Alert and oriented. Skin warm and dry. Oral mucosa is moist.   . Sclera anicteric, conjunctivae is pink. Thyroid not enlarged. No cervical lymphadenopathy. Lungs clear. Heart regular rate and rhythm.  Abdomen is soft. Bowel sounds are positive. No hepatomegaly. No abdominal masses felt. No tenderness.  No edema to lower extremities.        Assessment & Plan:  Nausea which has now resolved. She feels much better. She has gained almost 2 pounds since her lat office visit.

## 2015-09-04 NOTE — Patient Instructions (Signed)
OV 1 year. 

## 2015-09-05 ENCOUNTER — Ambulatory Visit (INDEPENDENT_AMBULATORY_CARE_PROVIDER_SITE_OTHER): Payer: PPO | Admitting: Family Medicine

## 2015-09-05 ENCOUNTER — Encounter: Payer: Self-pay | Admitting: Family Medicine

## 2015-09-05 VITALS — BP 120/78 | HR 70 | Resp 16 | Ht 65.5 in | Wt 129.1 lb

## 2015-09-05 DIAGNOSIS — H547 Unspecified visual loss: Secondary | ICD-10-CM

## 2015-09-05 DIAGNOSIS — Z0000 Encounter for general adult medical examination without abnormal findings: Secondary | ICD-10-CM | POA: Insufficient documentation

## 2015-09-05 DIAGNOSIS — H5440 Blindness, one eye, unspecified eye: Secondary | ICD-10-CM

## 2015-09-05 DIAGNOSIS — G4700 Insomnia, unspecified: Secondary | ICD-10-CM

## 2015-09-05 MED ORDER — DIAZEPAM 2 MG PO TABS: ORAL_TABLET | ORAL | Status: DC

## 2015-09-05 MED ORDER — MIRTAZAPINE 15 MG PO TBDP: ORAL_TABLET | ORAL | Status: DC

## 2015-09-05 NOTE — Progress Notes (Signed)
Subjective:    Patient ID: Kelsey Nelson, female    DOB: [birth year], 81 y.o.   MRN: [id number]  HPI Preventive Screening-Counseling & Management   Patient present here today for a Medicare annual wellness visit. "feels better than she has in years" Remeron has been good for her sleep and appetite, however wants additional med, benzo to help with her sleep    Current Problems (verified)   Medications Prior to Visit Allergies (verified)   PAST HISTORY  Family History (verified)   Social History Married for 60 years and 3 adult children and never a smoker    Risk Factors  Current exercise habits:  Walks 3 miles a day or 3-4 days a week at least   Dietary issues discussed: Low fat, low carb, heart healthy    Cardiac risk factors: None   Depression Screen  (Note: if answer to either of the following is "Yes", a more complete depression screening is indicated)   Over the past two weeks, have you felt down, depressed or hopeless? No  Over the past two weeks, have you felt little interest or pleasure in doing things? No  Have you lost interest or pleasure in daily life? No  Do you often feel hopeless? No  Do you cry easily over simple problems? No   Activities of Daily Living  In your present state of health, do you have any difficulty performing the following activities?  Driving?: No Managing money?: No Feeding yourself?:No Getting from bed to chair?:No Climbing a flight of stairs?:No Preparing food and eating?:No Bathing or showering?:No Getting dressed?:No Getting to the toilet?:No Using the toilet?:No Moving around from place to place?: No  Fall Risk Assessment In the past year have you fallen or had a near fall?:No Are you currently taking any medications that make you dizzy?:No   Hearing Difficulties: No Do you often ask people to speak up or repeat themselves?:No Do you experience ringing or noises in your ears?: occasionally  Do you have difficulty  understanding soft or whispered voices?:No  Cognitive Testing  Alert? Yes Normal Appearance?Yes  Oriented to person? Yes Place? Yes  Time? Yes  Displays appropriate judgment?Yes  Can read the correct time from a watch face? yes Are you having problems remembering things? Nothing out of the ordinary   Advanced Directives have been discussed with the patient?Yes, has a living will     List the Names of Other Physician/Practitioners you currently use:  Dr Laural Golden (gastro)    Indicate any recent Medical Services you may have received from other than [facility] providers in the past year (date may be approximate).   Assessment:    Annual Wellness Exam   Plan:    .  Medicare Attestation  I have personally reviewed:  The patient's medical and social history  Their use of alcohol, tobacco or illicit drugs  Their current medications and supplements  The patient's functional ability including ADLs,fall risks, home safety risks, cognitive, and hearing and visual impairment  Diet and physical activities  Evidence for depression or mood disorders  The patient's weight, height, BMI, and visual acuity have been recorded in the chart. I have made referrals, counseling, and provided education to the patient based on review of the above and I have provided the patient with a written personalized care plan for preventive services.     Review of Systems     Objective:   Physical Exam BP 120/78 mmHg  Pulse 70  Resp 16  Ht  5' 5.5" (1.664 m)  Wt 129 lb 1.9 oz (58.568 kg)  BMI 21.15 kg/m2  SpO2 98%        Assessment & Plan:  Medicare annual wellness visit, subsequent Annual exam as documented. Counseling done  re healthy lifestyle involving commitment to 150 minutes exercise per week, heart healthy diet, and attaining healthy weight.The importance of adequate sleep also discussed. Regular seat belt use and home safety, is also discussed. Changes in health habits are decided on by the  patient with goals and time frames  set for achieving them. Immunization and cancer screening needs are specifically addressed at this visit.   Insomnia Sleep hygiene reviewed and written information offered also. Prescription sent for  medication needed.

## 2015-09-05 NOTE — Patient Instructions (Signed)
Annual physical  In 5 months, call if you need me before  Come for flu vaccine pleas, now is not too early  Meds as discussed

## 2015-09-05 NOTE — Assessment & Plan Note (Signed)

## 2015-09-05 NOTE — Assessment & Plan Note (Signed)
Sleep hygiene reviewed and written information offered also. Prescription sent for  medication needed.  

## 2015-09-07 ENCOUNTER — Other Ambulatory Visit: Payer: Self-pay

## 2015-09-07 DIAGNOSIS — G4700 Insomnia, unspecified: Secondary | ICD-10-CM

## 2015-09-25 ENCOUNTER — Telehealth: Payer: Self-pay

## 2015-09-25 ENCOUNTER — Telehealth: Payer: Self-pay | Admitting: *Deleted

## 2015-09-25 DIAGNOSIS — S81802A Unspecified open wound, left lower leg, initial encounter: Secondary | ICD-10-CM

## 2015-09-25 DIAGNOSIS — R490 Dysphonia: Secondary | ICD-10-CM

## 2015-09-25 NOTE — Telephone Encounter (Signed)
pls explain hoarse and no cold really needs ENT to look at larynx, also sore which is not going away , getting bigger and itches needs dermatology, I believe best served by referrals out which I am happy to sign off on , pls let me know if she has a problem with this a[pproach, i believe it is in her best interest

## 2015-09-25 NOTE — Addendum Note (Signed)
Addended by: Eual Fines on: 09/25/2015 01:06 PM   Modules accepted: Orders

## 2015-09-25 NOTE — Telephone Encounter (Signed)
Authorization # for Dr. Nevada Crane is [id number]

## 2015-09-25 NOTE — Telephone Encounter (Signed)
Pt aware and referrals entered

## 2015-09-25 NOTE — Telephone Encounter (Signed)
States she has been hoarse x 2 months but she doesn't have a cold. Also she has a sore on her left leg that started out as a blister and itches like crazy and now its the size of a quarter and won't heal. Wants to be seen. Please advise

## 2015-11-22 DIAGNOSIS — H02103 Unspecified ectropion of right eye, unspecified eyelid: Secondary | ICD-10-CM | POA: Insufficient documentation

## 2015-12-24 ENCOUNTER — Other Ambulatory Visit (INDEPENDENT_AMBULATORY_CARE_PROVIDER_SITE_OTHER): Payer: Self-pay | Admitting: Internal Medicine

## 2016-01-10 ENCOUNTER — Telehealth: Payer: Self-pay | Admitting: Family Medicine

## 2016-01-10 MED ORDER — OMEPRAZOLE 20 MG PO CPDR: 20.0000 mg | DELAYED_RELEASE_CAPSULE | ORAL | Status: DC | PRN

## 2016-01-10 NOTE — Telephone Encounter (Signed)
Med refilled.

## 2016-01-10 NOTE — Telephone Encounter (Signed)
Patient is asking for a refill on omeprazole (PRILOSEC) 20 MG capsule  Called to [facility], please advise?

## 2016-02-05 DIAGNOSIS — H10411 Chronic giant papillary conjunctivitis, right eye: Secondary | ICD-10-CM | POA: Diagnosis not present

## 2016-02-05 DIAGNOSIS — H5711 Ocular pain, right eye: Secondary | ICD-10-CM | POA: Diagnosis not present

## 2016-02-05 DIAGNOSIS — H05401 Unspecified enophthalmos, right eye: Secondary | ICD-10-CM | POA: Diagnosis not present

## 2016-02-05 DIAGNOSIS — H2512 Age-related nuclear cataract, left eye: Secondary | ICD-10-CM | POA: Diagnosis not present

## 2016-02-12 ENCOUNTER — Encounter: Payer: PPO | Admitting: Family Medicine

## 2016-02-22 ENCOUNTER — Other Ambulatory Visit: Payer: Self-pay | Admitting: Family Medicine

## 2016-02-27 ENCOUNTER — Telehealth: Payer: Self-pay | Admitting: Family Medicine

## 2016-02-27 NOTE — Telephone Encounter (Signed)
Med refilled to [facility]

## 2016-02-27 NOTE — Telephone Encounter (Signed)
Kelsey Nelson is asking for a refill on her Blood Pressure Medication, please advise?

## 2016-03-11 DIAGNOSIS — H2512 Age-related nuclear cataract, left eye: Secondary | ICD-10-CM | POA: Diagnosis not present

## 2016-03-11 DIAGNOSIS — Z9889 Other specified postprocedural states: Secondary | ICD-10-CM | POA: Diagnosis not present

## 2016-03-11 DIAGNOSIS — Z885 Allergy status to narcotic agent status: Secondary | ICD-10-CM | POA: Diagnosis not present

## 2016-03-11 DIAGNOSIS — Z888 Allergy status to other drugs, medicaments and biological substances status: Secondary | ICD-10-CM | POA: Diagnosis not present

## 2016-03-11 DIAGNOSIS — Z79899 Other long term (current) drug therapy: Secondary | ICD-10-CM | POA: Diagnosis not present

## 2016-03-11 DIAGNOSIS — Z7982 Long term (current) use of aspirin: Secondary | ICD-10-CM | POA: Diagnosis not present

## 2016-03-11 DIAGNOSIS — H02401 Unspecified ptosis of right eyelid: Secondary | ICD-10-CM | POA: Diagnosis not present

## 2016-03-11 DIAGNOSIS — H1189 Other specified disorders of conjunctiva: Secondary | ICD-10-CM | POA: Diagnosis not present

## 2016-03-11 DIAGNOSIS — I10 Essential (primary) hypertension: Secondary | ICD-10-CM | POA: Diagnosis not present

## 2016-03-19 ENCOUNTER — Telehealth: Payer: Self-pay

## 2016-03-19 ENCOUNTER — Other Ambulatory Visit: Payer: Self-pay | Admitting: Family Medicine

## 2016-03-19 DIAGNOSIS — E785 Hyperlipidemia, unspecified: Secondary | ICD-10-CM

## 2016-03-19 DIAGNOSIS — E559 Vitamin D deficiency, unspecified: Secondary | ICD-10-CM | POA: Diagnosis not present

## 2016-03-19 DIAGNOSIS — R7302 Impaired glucose tolerance (oral): Secondary | ICD-10-CM | POA: Diagnosis not present

## 2016-03-19 DIAGNOSIS — I10 Essential (primary) hypertension: Secondary | ICD-10-CM

## 2016-03-19 NOTE — Telephone Encounter (Signed)
Lab order printed

## 2016-03-20 LAB — CBC WITH DIFFERENTIAL/PLATELET
BASOS ABS: 0 10*3/uL (ref 0.0–0.1)
Basophils Relative: 0 % (ref 0–1)
Eosinophils Absolute: 0.1 10*3/uL (ref 0.0–0.7)
Eosinophils Relative: 2 % (ref 0–5)
HEMATOCRIT: 38.8 % (ref 36.0–46.0)
HEMOGLOBIN: 12.9 g/dL (ref 12.0–15.0)
LYMPHS ABS: 1 10*3/uL (ref 0.7–4.0)
LYMPHS PCT: 25 % (ref 12–46)
MCH: 29.5 pg (ref 26.0–34.0)
MCHC: 33.2 g/dL (ref 30.0–36.0)
MCV: 88.8 fL (ref 78.0–100.0)
MONOS PCT: 7 % (ref 3–12)
MPV: 10 fL (ref 8.6–12.4)
Monocytes Absolute: 0.3 10*3/uL (ref 0.1–1.0)
NEUTROS PCT: 66 % (ref 43–77)
Neutro Abs: 2.7 10*3/uL (ref 1.7–7.7)
Platelets: 290 10*3/uL (ref 150–400)
RBC: 4.37 MIL/uL (ref 3.87–5.11)
RDW: 13.7 % (ref 11.5–15.5)
WBC: 4.1 10*3/uL (ref 4.0–10.5)

## 2016-03-20 LAB — LIPID PANEL
CHOL/HDL RATIO: 3.1 ratio (ref <=5.0)
CHOLESTEROL: 270 mg/dL — AB (ref 125–200)
HDL: 88 mg/dL (ref >=46)
LDL CALC: 156 mg/dL — AB (ref <130)
TRIGLYCERIDES: 128 mg/dL (ref <150)
VLDL: 26 mg/dL (ref <30)

## 2016-03-20 LAB — TSH: TSH: 2.22 mIU/L

## 2016-03-21 ENCOUNTER — Ambulatory Visit (INDEPENDENT_AMBULATORY_CARE_PROVIDER_SITE_OTHER): Payer: PPO | Admitting: Family Medicine

## 2016-03-21 ENCOUNTER — Encounter: Payer: Self-pay | Admitting: Family Medicine

## 2016-03-21 VITALS — BP 130/72 | HR 68 | Resp 16 | Ht 66.0 in | Wt 130.1 lb

## 2016-03-21 DIAGNOSIS — E785 Hyperlipidemia, unspecified: Secondary | ICD-10-CM

## 2016-03-21 DIAGNOSIS — I10 Essential (primary) hypertension: Secondary | ICD-10-CM

## 2016-03-21 DIAGNOSIS — Z0000 Encounter for general adult medical examination without abnormal findings: Secondary | ICD-10-CM | POA: Diagnosis not present

## 2016-03-21 DIAGNOSIS — R7303 Prediabetes: Secondary | ICD-10-CM

## 2016-03-21 LAB — COMPREHENSIVE METABOLIC PANEL
ALBUMIN: 4.1 g/dL (ref 3.6–5.1)
ALK PHOS: 110 U/L (ref 33–130)
ALT: 14 U/L (ref 6–29)
AST: 23 U/L (ref 10–35)
BILIRUBIN TOTAL: 0.5 mg/dL (ref 0.2–1.2)
BUN: 20 mg/dL (ref 7–25)
CALCIUM: 9.2 mg/dL (ref 8.6–10.4)
CO2: 23 mmol/L (ref 20–31)
Chloride: 102 mmol/L (ref 98–110)
Creat: 1 mg/dL — ABNORMAL HIGH (ref 0.60–0.93)
GLUCOSE: 92 mg/dL (ref 65–99)
POTASSIUM: 4.4 mmol/L (ref 3.5–5.3)
Sodium: 140 mmol/L (ref 135–146)
TOTAL PROTEIN: 6.7 g/dL (ref 6.1–8.1)

## 2016-03-21 LAB — HEMOGLOBIN A1C
Hgb A1c MFr Bld: 6 % — ABNORMAL HIGH (ref <5.7)
Mean Plasma Glucose: 126 mg/dL

## 2016-03-21 LAB — VITAMIN D 25 HYDROXY (VIT D DEFICIENCY, FRACTURES): Vit D, 25-Hydroxy: 65 ng/mL (ref 30–100)

## 2016-03-21 MED ORDER — SERTRALINE HCL 50 MG PO TABS: 50.0000 mg | ORAL_TABLET | Freq: Every day | ORAL | Status: DC

## 2016-03-21 NOTE — Progress Notes (Signed)
   Subjective:    Patient ID: Kelsey Nelson, female    DOB: 1934-04-01, 82 y.o.   MRN: [id number]  HPI Patient is in for annual physical exam. No other health concerns are expressed or addressed at the visit. Recent labs, if available are reviewed. Immunization is reviewed , and  updated if needed.    Review of Systems See HPI     Objective:   Physical Exam BP 130/72 mmHg  Pulse 68  Resp 16  Ht 5\' 6"  (1.676 m)  Wt 130 lb 1.9 oz (59.022 kg)  BMI 21.01 kg/m2  SpO2 97%  Pleasant well nourished female, alert and oriented x 3, in no cardio-pulmonary distress. Afebrile. HEENT No facial trauma or asymetry. Sinuses non tender.  Extra occullar muscles intact, . External ears normal, tympanic membranes clear. Oropharynx moist, no exudate, fair dentition. Neck: supple, no adenopathy,JVD or thyromegaly.No bruits.  Chest: Clear to ascultation bilaterally.No crackles or wheezes. Non tender to palpation  Breast: No asymetry,no masses or lumps. No tenderness. No nipple discharge or inversion. No axillary or supraclavicular adenopathy  Cardiovascular system; Heart sounds normal,  S1 and  S2 ,no S3.  No murmur, or thrill. Apical beat not displaced Peripheral pulses normal.  Abdomen: Soft, non tender, no organomegaly or masses. No bruits. Bowel sounds normal. No guarding, tenderness or rebound.  Rectal:  Normal sphincter tone. No mass.No rectal masses.  Guaiac negative stool.  GU: External genitalia normal female genitalia , female distribution of hair. No lesions. Urethral meatus normal in size, no  Prolapse, no lesions visibly  Present. Bladder non tender. Vagina pink and moist , with no visible lesions , discharge present . Adequate pelvic support no  cystocele or rectocele noted Cervix pink and appears healthy, no lesions or ulcerations noted, no discharge noted from os Uterus absent no adnexal masses, no  adnexal tenderness.   Musculoskeletal exam: Full ROM of  spine, hips , shoulders and knees. No deformity ,swelling or crepitus noted. No muscle wasting or atrophy.   Neurologic: Cranial nerves 3 to 12 intact. Power, tone ,sensation and reflexes normal throughout. No disturbance in gait. No tremor.  Skin: Intact, no ulceration, erythema , scaling or rash noted.Multiple hyperpigmented papular skin lesions Psych; Normal mood and affect. Judgement and concentration normal       Assessment & Plan:  Encounter for annual physical exam Annual exam as documented. Counseling done  re healthy lifestyle involving commitment to 150 minutes exercise per week, heart healthy diet, and attaining healthy weight.The importance of adequate sleep also discussed. Regular seat belt use and home safety, is also discussed. Changes in health habits are decided on by the patient with goals and time frames  set for achieving them. Immunization and cancer screening needs are specifically addressed at this visit.

## 2016-03-21 NOTE — Patient Instructions (Signed)
Annual wellness end September, call if you need me sooner  Please change eating to less fat, butter, cheese, and biscuits and sweets  MORE FRUIT, VEGETABLES, BEANS , white baked / grilled meats , blood sugar and cholesterol are both too high and that is damaging to your blood vessels and heart  Continue great exercise habits  No changes in medication  Thanks for choosing [facility], we consider it a privelige to serve you.  Fasting lipid, chem 7 , hBA1C in 5 month, 2 week  HAPPY 82 on June 1!, many, many more!!!

## 2016-03-23 DIAGNOSIS — Z0000 Encounter for general adult medical examination without abnormal findings: Secondary | ICD-10-CM | POA: Insufficient documentation

## 2016-03-23 NOTE — Assessment & Plan Note (Signed)

## 2016-03-25 ENCOUNTER — Other Ambulatory Visit: Payer: Self-pay

## 2016-03-25 ENCOUNTER — Telehealth: Payer: Self-pay

## 2016-03-25 DIAGNOSIS — E785 Hyperlipidemia, unspecified: Secondary | ICD-10-CM

## 2016-03-25 MED ORDER — PRAVASTATIN SODIUM 20 MG PO TABS: 20.0000 mg | ORAL_TABLET | Freq: Every evening | ORAL | Status: DC

## 2016-03-25 NOTE — Telephone Encounter (Signed)
-----  Message from Fayrene Helper, MD sent at 03/22/2016  9:22 AM EDT ----- pls let her know that she has normal liver and kidney function. Is she interested in resuming a statin to help lower her cholesterol, along with dietary change? If so. Low  Dose pravachol 20 mg ( if  Preferred) is my recommendation, sned 6 months if agrees , will need rept lipid, cmp and EGFR fasting in 4 to 5 month  ??  pls ask

## 2016-03-25 NOTE — Telephone Encounter (Signed)
Patient aware of lab results.  Will start trial of Pravachol.  Medication sent to requested pharmacy.  Repeat lab order mailed to patient home.

## 2016-05-16 ENCOUNTER — Encounter (INDEPENDENT_AMBULATORY_CARE_PROVIDER_SITE_OTHER): Payer: Self-pay | Admitting: Internal Medicine

## 2016-07-08 DIAGNOSIS — Z4421 Encounter for fitting and adjustment of artificial right eye: Secondary | ICD-10-CM | POA: Diagnosis not present

## 2016-08-18 ENCOUNTER — Other Ambulatory Visit: Payer: Self-pay | Admitting: Family Medicine

## 2016-08-28 DIAGNOSIS — L918 Other hypertrophic disorders of the skin: Secondary | ICD-10-CM | POA: Diagnosis not present

## 2016-08-28 DIAGNOSIS — X32XXXA Exposure to sunlight, initial encounter: Secondary | ICD-10-CM | POA: Diagnosis not present

## 2016-08-28 DIAGNOSIS — L570 Actinic keratosis: Secondary | ICD-10-CM | POA: Diagnosis not present

## 2016-08-28 DIAGNOSIS — Z1283 Encounter for screening for malignant neoplasm of skin: Secondary | ICD-10-CM | POA: Diagnosis not present

## 2016-08-30 ENCOUNTER — Ambulatory Visit (INDEPENDENT_AMBULATORY_CARE_PROVIDER_SITE_OTHER): Payer: PPO | Admitting: Internal Medicine

## 2016-08-30 ENCOUNTER — Encounter (INDEPENDENT_AMBULATORY_CARE_PROVIDER_SITE_OTHER): Payer: Self-pay | Admitting: Internal Medicine

## 2016-08-30 VITALS — BP 128/65 | HR 56 | Temp 97.5°F | Ht 65.0 in | Wt 124.6 lb

## 2016-08-30 DIAGNOSIS — R110 Nausea: Secondary | ICD-10-CM | POA: Diagnosis not present

## 2016-08-30 NOTE — Patient Instructions (Signed)
OV in 1 year.  

## 2016-08-30 NOTE — Progress Notes (Signed)
Subjective:    Patient ID: Kelsey Nelson, female    DOB: Aug 06, 1934, 82 y.o.   MRN: [id number]  HPI Here today for f/u. She was last seen in September of 2016 by me. Hx of chronic nausea. Her last visit she was much better (100%).  She rarely had nausea. . Her appetite had improved and she had gained 2 pounds.   Eye surgery in 2016 for hx of evisceration of her rt eye with implant by Dr. Iona Hansen.  She had EGD in May 2012 revealing gastroduodenitis but there was no evidence of peptic ulcer disease. Gastric biopsy revealed erosive gastritis but H. pylori stains were negative.   Weight 128 at last OV in September of 2016. Today her weight is 124.6lb She tells me she is doing well. She is 100% better. She has no nausea. Appetite is good. BMs x 1 a day. No melena or BRRB.  She was treated for diverticulitis in November 2014. She had colonoscopy in December 2014 revealing multiple diverticula at sigmoid colon small external hemorrhoids and single polyp which turned out to be tubular adenoma.   Her last colonoscopy was in 2014: Impression:  Examination performed to cecum. Small polyps ablated via cold biopsy from proximal transverse colon. Multiple diverticula at sigmoid colon. Small external hemorrhoids.  Review of Systems Past Medical History:  Diagnosis Date  . Blindness of right eye   . BMI between 19-24,adult 2010 145 LBS  . Diverticula of small intestine Dec 2010  . Diverticulosis    [place]  . Functional constipation 2010  . GERD (gastroesophageal reflux disease)   . HTN (hypertension)   . Hyperlipemia   . Right eye trauma 1949   Loss of vision resulted at age 27  . Schatzki's ring Dec 2010    Past Surgical History:  Procedure Laterality Date  . ABDOMINAL HYSTERECTOMY    . BREAST REDUCTION SURGERY  1992 BIL  . COLONOSCOPY  DEC 2010   SIMPLE ADENOMAS (6-8), [place] TICS, SML IH  . COLONOSCOPY N/A 12/08/2013   Procedure: COLONOSCOPY;  Surgeon: Rogene Houston, MD;  Location: [facility]  ENDO [facility];  Service: Endoscopy;  Laterality: N/A;  225  . EVISCERATION Right 05/08/2015   Procedure: EVISCERATION WITH IMPLANT RIGHT EYE;  Surgeon: Williams Che, MD;  Location: [facility] ORS;  Service: Ophthalmology;  Laterality: Right;  . EYE SURGERY  RIGHT 2005  . EYE SURGERY  right 2016   removed and false eye placed in 08/2015  . TOTAL ABDOMINAL HYSTERECTOMY W/ BILATERAL SALPINGOOPHORECTOMY  1985 FIBROIDS  . UPPER GASTROINTESTINAL ENDOSCOPY  DEC 2010   Bx-MILD GASTRITIS/DUODENITIS, DUODENAL DIVERTICULA, SCHATZKI'S RING    Allergies  Allergen Reactions  . Codeine   . Fosamax [Alendronate Sodium]     heartburn    Current Outpatient Prescriptions on File Prior to Visit  Medication Sig Dispense Refill  . aspirin 81 MG tablet Take 81 mg by mouth daily.     . Multiple Vitamin (MULTI-VITAMIN DAILY PO) Take by mouth daily. doTerra Women -  Bone Nutireint    . NON FORMULARY doTerra Women - Phytoestrogen - Patient states that she takes 2 by mouth daily.    . NON FORMULARY doTerra - Digestzen oil - a couple drops at bedtime Alternates with the Lemon Oil- a couple of drops at night.    [name] omeprazole (PRILOSEC) 20 MG capsule Take 1 capsule (20 mg total) by mouth as needed. 30 capsule 3  . OVER THE COUNTER MEDICATION Stool Softner with stimulant  laxative - Docusate Sodium 50 mg Sennosides 8.6 mg - 1 by mouth daily.    . pravastatin (PRAVACHOL) 20 MG tablet Take 1 tablet (20 mg total) by mouth every evening. 30 tablet 5  . ramipril (ALTACE) 5 MG capsule TAKE 1 CAPSULE(5 MG) BY MOUTH DAILY 90 capsule 0  . sertraline (ZOLOFT) 50 MG tablet Take 1 tablet (50 mg total) by mouth daily. 30 tablet 5   No current facility-administered medications on file prior to visit.        Objective:   Physical Exam Blood pressure 128/65, pulse (!) 56, temperature 97.5 F (36.4 C), height 5\' 5"  (1.651 m), weight 124 lb 9.6 oz (56.5 kg).  Alert and oriented. Skin warm and dry. Oral mucosa is moist.   . Sclera  anicteric, conjunctivae is pink. Thyroid not enlarged. No cervical lymphadenopathy. Lungs clear. Heart regular rate and rhythm.  Abdomen is soft. Bowel sounds are positive. No hepatomegaly. No abdominal masses felt. No tenderness.  No edema to lower extremities.  .       Assessment & Plan:  Chronic nausea. She is much better.  1000%.  OV in 1 year.

## 2016-09-03 ENCOUNTER — Other Ambulatory Visit: Payer: Self-pay | Admitting: Family Medicine

## 2016-09-03 DIAGNOSIS — E785 Hyperlipidemia, unspecified: Secondary | ICD-10-CM | POA: Diagnosis not present

## 2016-09-03 DIAGNOSIS — R7309 Other abnormal glucose: Secondary | ICD-10-CM | POA: Diagnosis not present

## 2016-09-03 DIAGNOSIS — I10 Essential (primary) hypertension: Secondary | ICD-10-CM | POA: Diagnosis not present

## 2016-09-03 DIAGNOSIS — R7989 Other specified abnormal findings of blood chemistry: Secondary | ICD-10-CM | POA: Diagnosis not present

## 2016-09-03 DIAGNOSIS — R7303 Prediabetes: Secondary | ICD-10-CM | POA: Diagnosis not present

## 2016-09-04 LAB — BASIC METABOLIC PANEL
BUN: 18 mg/dL (ref 7–25)
CALCIUM: 9.1 mg/dL (ref 8.6–10.4)
CHLORIDE: 106 mmol/L (ref 98–110)
CO2: 27 mmol/L (ref 20–31)
CREATININE: 0.83 mg/dL (ref 0.60–0.88)
Glucose, Bld: 82 mg/dL (ref 65–99)
Potassium: 4.2 mmol/L (ref 3.5–5.3)
Sodium: 141 mmol/L (ref 135–146)

## 2016-09-04 LAB — LIPID PANEL
CHOLESTEROL: 218 mg/dL — AB (ref 125–200)
HDL: 98 mg/dL (ref >=46)
LDL CALC: 95 mg/dL (ref <130)
TRIGLYCERIDES: 125 mg/dL (ref <150)
Total CHOL/HDL Ratio: 2.2 Ratio (ref <=5.0)
VLDL: 25 mg/dL (ref <30)

## 2016-09-04 LAB — HEMOGLOBIN A1C
Hgb A1c MFr Bld: 5.6 % (ref <5.7)
MEAN PLASMA GLUCOSE: 114 mg/dL

## 2016-09-05 LAB — HEPATIC FUNCTION PANEL
ALBUMIN: 4.1 g/dL (ref 3.6–5.1)
ALK PHOS: 112 U/L (ref 33–130)
ALT: 17 U/L (ref 6–29)
AST: 24 U/L (ref 10–35)
BILIRUBIN TOTAL: 0.4 mg/dL (ref 0.2–1.2)
Bilirubin, Direct: 0.1 mg/dL (ref <=0.2)
Indirect Bilirubin: 0.3 mg/dL (ref 0.2–1.2)
TOTAL PROTEIN: 6.6 g/dL (ref 6.1–8.1)

## 2016-09-12 ENCOUNTER — Ambulatory Visit (INDEPENDENT_AMBULATORY_CARE_PROVIDER_SITE_OTHER): Payer: PPO

## 2016-09-12 VITALS — BP 132/76 | HR 66 | Resp 18 | Ht 65.0 in | Wt 125.0 lb

## 2016-09-12 DIAGNOSIS — Z0000 Encounter for general adult medical examination without abnormal findings: Secondary | ICD-10-CM | POA: Diagnosis not present

## 2016-09-12 MED ORDER — PRAVASTATIN SODIUM 20 MG PO TABS: 20.0000 mg | ORAL_TABLET | Freq: Every evening | ORAL | 0 refills | Status: DC

## 2016-09-12 MED ORDER — RAMIPRIL 5 MG PO CAPS: ORAL_CAPSULE | ORAL | 0 refills | Status: DC

## 2016-09-12 MED ORDER — SERTRALINE HCL 50 MG PO TABS: ORAL_TABLET | ORAL | 0 refills | Status: DC

## 2016-09-12 NOTE — Assessment & Plan Note (Addendum)
Annual exam as documented. . Immunization and cancer screening needs are specifically addressed at this visit.  Refuses flu vaccine 

## 2016-09-12 NOTE — Patient Instructions (Signed)
Thank you for choosing [facility] for your health care needs  The Annual Wellness Visit is designed to allow Kelsey Nelson the chance to assist you in preserving and improving you health.   Dr. Moshe Cipro will see you in 4 months for your next office visit  Labs needed will be mailed to you in December   If you have any questions or concerns please feel free to contact the office.

## 2016-09-12 NOTE — Progress Notes (Signed)
Subjective:    Kelsey Nelson is a 82 y.o. female who presents for Medicare Annual/Subsequent preventive examination.  Preventive Screening-Counseling & Management  Tobacco History  Smoking Status  . Never Smoker  Smokeless Tobacco  . Never Used      Current Problems (verified) Patient Active Problem List   Diagnosis Date Noted  . Medicare annual wellness visit, subsequent 09/05/2015  . Skin lesions, generalized 06/19/2015  . Diverticulitis of colon (without mention of hemorrhage) 11/09/2013  . Tubular adenoma 11/09/2013  . Family hx of colon cancer 11/09/2013  . Urinary urgency 10/31/2013  . Pancreatic cyst 02/05/2013  . Anxiety and depression 07/20/2012  . Nocturia 12/02/2011  . Insomnia 09/20/2011  . Renal cyst 05/27/2011  . GERD 11/20/2009  . Hyperlipidemia LDL goal <100 03/31/2008  . BLINDNESS, RIGHT EYE 03/31/2008  . Essential hypertension 03/31/2008  . DIVERTICULOSIS, SIGMOID COLON 03/31/2008  . OSTEOPOROSIS 03/31/2008    Medications Prior to Visit Current Outpatient Prescriptions on File Prior to Visit  Medication Sig Dispense Refill  . aspirin 81 MG tablet Take 81 mg by mouth daily.     . Multiple Vitamin (MULTI-VITAMIN DAILY PO) Take by mouth daily. doTerra Women -  Bone Nutireint    . omeprazole (PRILOSEC) 20 MG capsule Take 1 capsule (20 mg total) by mouth as needed. 30 capsule 3  . OVER THE COUNTER MEDICATION As needed     No current facility-administered medications on file prior to visit.     Current Medications (verified) Current Outpatient Prescriptions  Medication Sig Dispense Refill  . aspirin 81 MG tablet Take 81 mg by mouth daily.     . Multiple Vitamin (MULTI-VITAMIN DAILY PO) Take by mouth daily. doTerra Women -  Bone Nutireint    . omeprazole (PRILOSEC) 20 MG capsule Take 1 capsule (20 mg total) by mouth as needed. 30 capsule 3  . OVER THE COUNTER MEDICATION As needed    . pravastatin (PRAVACHOL) 20 MG tablet Take 1 tablet (20 mg total)  by mouth every evening. 90 tablet 0  . ramipril (ALTACE) 5 MG capsule TAKE 1 CAPSULE(5 MG) BY MOUTH DAILY 90 capsule 0  . sertraline (ZOLOFT) 50 MG tablet TAKE 1 TABLET DAILY (NEEDS APPOINTMENT BEFORE FURTHER REFILLS) 90 tablet 0   No current facility-administered medications for this visit.      Allergies (verified) Codeine and Fosamax [alendronate sodium]   PAST HISTORY  Family History Family History  Problem Relation Age of Onset  . Pancreatic cancer Sister   . Colon cancer Paternal Aunt   . Colon polyps Neg Hx     Social History Social History  Substance Use Topics  . Smoking status: Never Smoker  . Smokeless tobacco: Never Used  . Alcohol use No     Are there smokers in your home (other than you)? No  Risk Factors Current exercise habits: Home exercise routine includes walking 2 hrs per day.  Dietary issues discussed: Discussed changes with food labels   Cardiac risk factors:   Depression Screen (Note: if answer to either of the following is "Yes", a more complete depression screening is indicated)   Over the past two weeks, have you felt down, depressed or hopeless? No  Over the past two weeks, have you felt little interest or pleasure in doing things? No  Have you lost interest or pleasure in daily life? No  Do you often feel hopeless? No  Do you cry easily over simple problems? No  Activities of Daily Living In  your present state of health, do you have any difficulty performing the following activities?:  Driving? No Managing money?  No Feeding yourself? No Getting from bed to chair? NoNo exam performed today, annual wellness visit without physical exam. Climbing a flight of stairs? No Preparing food and eating?: No Bathing or showering? No Getting dressed: No Getting to the toilet? No Using the toilet:No Moving around from place to place: No In the past year have you fallen or had a near fall?:No   Are you sexually active?  Yes  Do you have more  than one partner?  No  Hearing Difficulties: No Do you often ask people to speak up or repeat themselves? No Do you experience ringing or noises in your ears? No Do you have difficulty understanding soft or whispered voices? No   Do you feel that you have a problem with memory? No  Do you often misplace items? No  Do you feel safe at home?  Yes  Cognitive Testing  Alert? Yes  Normal Appearance?Yes  Oriented to person? Yes  Place? Yes   Time? Yes  Recall of three objects?  Yes  Can perform simple calculations? Yes  Displays appropriate judgment?Yes  Can read the correct time from a watch face?Yes   Advanced Directives have been discussed with the patient? Yes  List the Names of Other Physician/Practitioners you currently use: 1.  Yates (opthamology) 2.  Rehman (gi) 3.  [place] Crane (dermatology)  Indicate any recent Medical Services you may have received from other than [facility] providers in the past year (date may be approximate).  Immunization History  Administered Date(s) Administered  . Influenza Split 09/24/2012, 10/19/2015  . Influenza Whole 12/31/2006, 11/06/2009, 10/29/2011  . Influenza,inj,Quad PF,36+ Mos 10/26/2013, 12/05/2014  . Pneumococcal Conjugate-13 07/11/2014  . Pneumococcal Polysaccharide-23 12/23/2001  . Td 08/06/2004  . Tdap 06/05/2015  . Zoster 04/16/2007    Screening Tests Health Maintenance  Topic Date Due  . INFLUENZA VACCINE  03/22/2017 (Originally 07/23/2016)  . TETANUS/TDAP  06/04/2025  . DEXA SCAN  Completed  . ZOSTAVAX  Completed  . PNA vac Low Risk Adult  Completed    All answers were reviewed with the patient and necessary referrals were made:  Vanetta Mulders, LPN   [id number]   History reviewed: allergies, current medications, past family history, past medical history, past social history, past surgical history and problem list  Review of Systems A comprehensive review of systems was negative.    Objective:     Vision by  Snellen chart: right [id number] to measure, left eye:20/25  Body mass index is 20.8 kg/m. BP 132/76   Pulse 66   Resp 18   Ht 5\' 5"  (1.651 m)   Wt 125 lb (56.7 kg)   SpO2 99%   BMI 20.80 kg/m   No exam performed today, annual wellness visit without physical exam.     Assessment:     Medicare annual wellness visit, subsequent Annual exam as documented. Immunization and cancer screening needs are specifically addressed at this visit. Refuses flu vaccine      Plan:     During the course of the visit the patient was educated and counseled about appropriate screening and preventive services including:    Influenza vaccine  Diet review for nutrition referral? Yes ____  Not Indicated __x__   Patient Instructions (the written plan) was given to the patient.  Medicare Attestation I have personally reviewed: The patient's medical and social history Their use of alcohol, tobacco  or illicit drugs Their current medications and supplements The patient's functional ability including ADLs,fall risks, home safety risks, cognitive, and hearing and visual impairment Diet and physical activities Evidence for depression or mood disorders  The patient's weight, height, BMI, and visual acuity have been recorded in the chart.  I have made referrals, counseling, and provided education to the patient based on review of the above and I have provided the patient with a written personalized care plan for preventive services.     Denman George [place], [place]   [id number]

## 2016-09-16 IMAGING — MR MR ORBITS WO/W CM
6 of 10 series · 21 of 48 positions shown · IV contrast (multihance)
Comparison: MRI brain 07/03/2011.

CLINICAL DATA: Right periorbital pain.  Right eye swelling.

EXAM:
MRI HEAD AND ORBITS WITHOUT AND WITH CONTRAST
TECHNIQUE: Multiplanar, multiecho pulse sequences of the brain and surrounding
structures were obtained without and with intravenous contrast.
Multiplanar, multiecho pulse sequences of the orbits and surrounding
structures were obtained including fat saturation techniques, before
and after intravenous contrast administration.
CONTRAST:  12mL MULTIHANCE GADOBENATE DIMEGLUMINE 529 MG/ML IV SOLN

[Series 2: T1 · sagittal · 5.0mm · 0.43mm/px · 2 of 23 slices shown (1 of 2)]
[im 1/23]
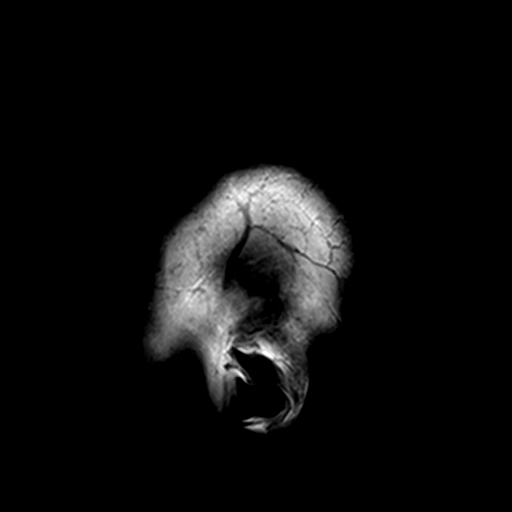
[im 23/23]
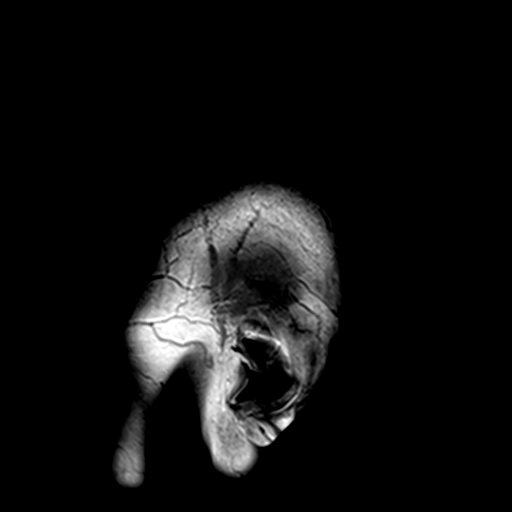

[Series 5: T2 · axial · 5.0mm · 0.47mm/px · z∈[-37,+105]mm · 2 of 23 slices shown (1 of 2)]
[im 1/23]
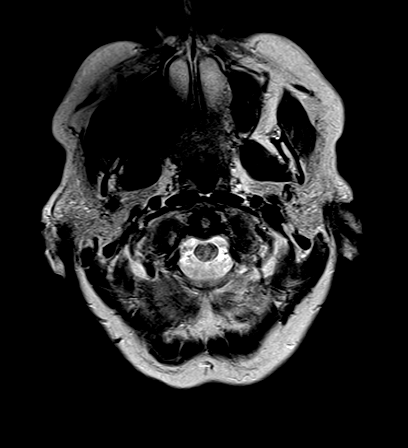
[im 23/23]
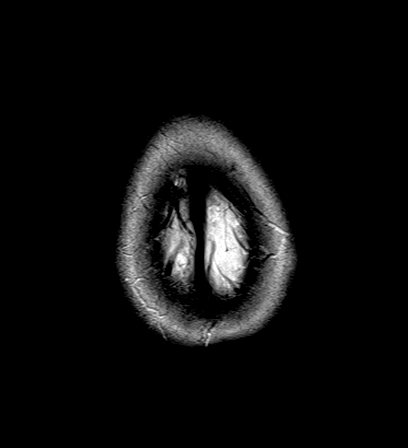

[Series 6: FLAIR · axial · 5.0mm · 0.33mm/px · z∈[-37,+106]mm · 3 of 23 slices shown]
[im 1/23]
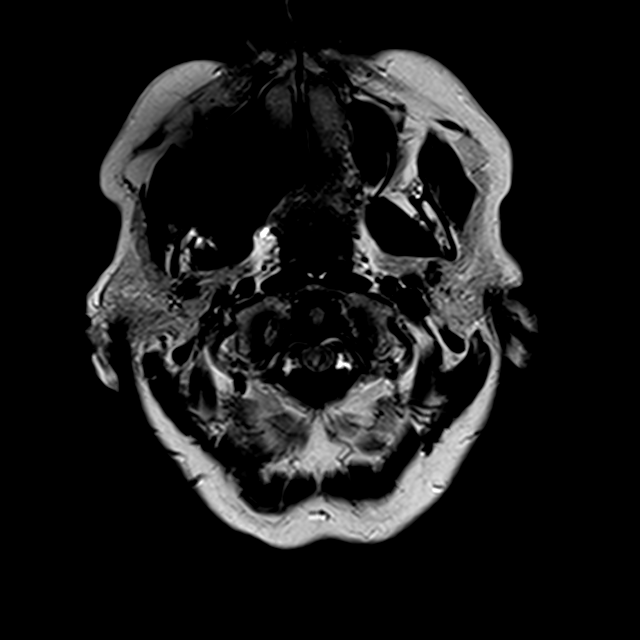
[im 12/23]
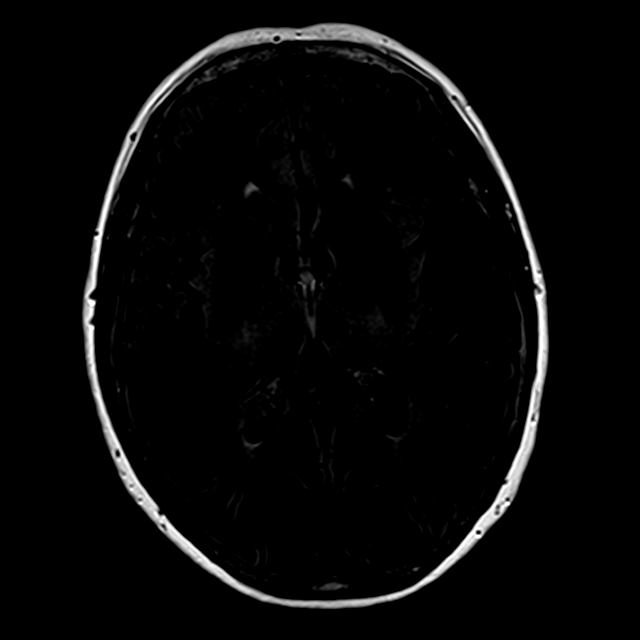
[im 23/23]
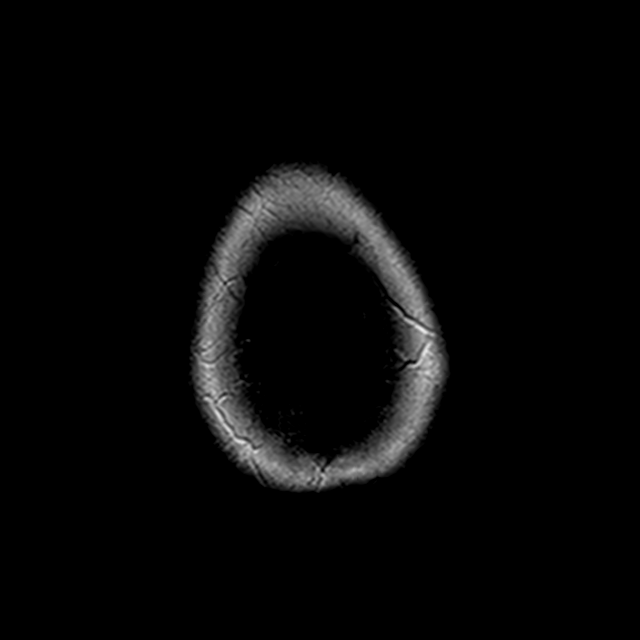

[Series 7: T1 · axial · 2.0mm · 0.42mm/px · z∈[-69,+116]mm · 8 of 94 slices shown (2 of 2)]
[im 1/94]
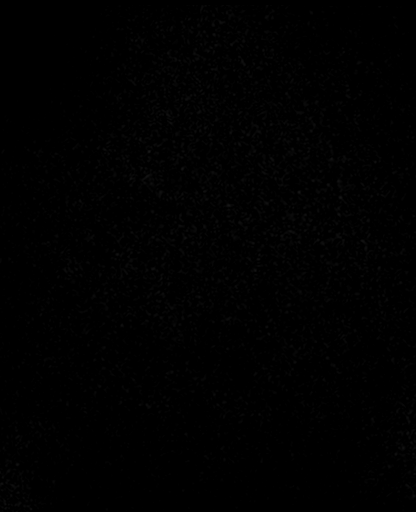
[im 11/94]
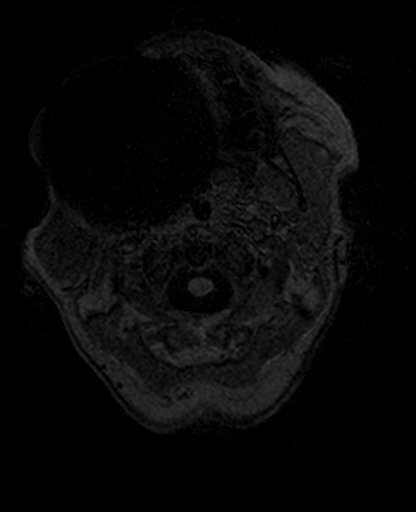
[im 32/94]
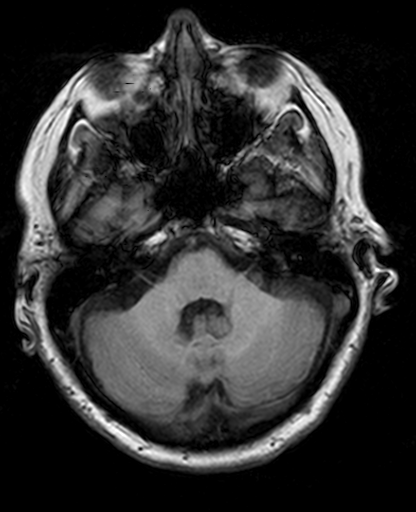
[im 42/94]
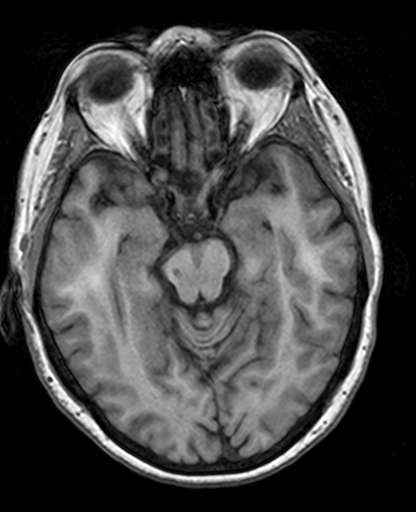
[im 52/94]
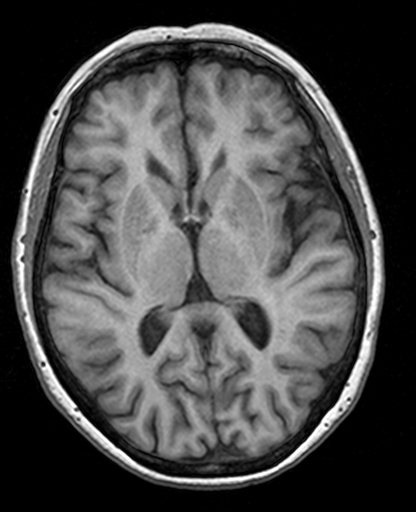
[im 63/94]
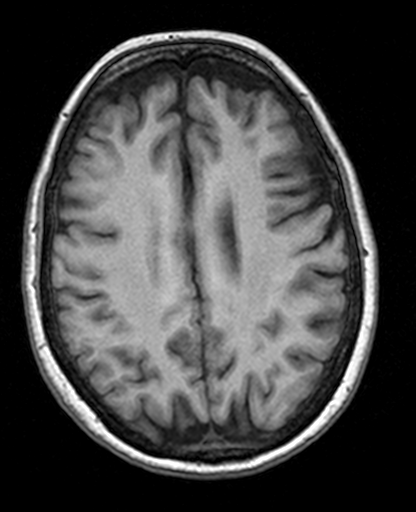
[im 83/94]
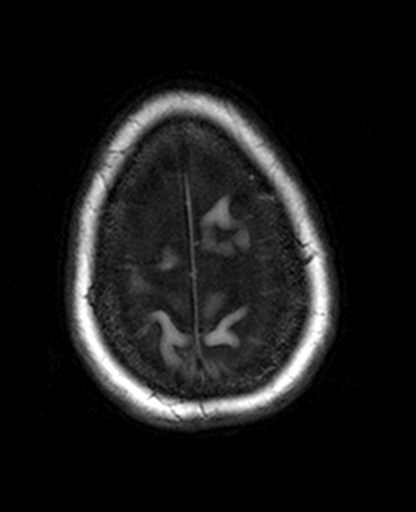
[im 94/94]
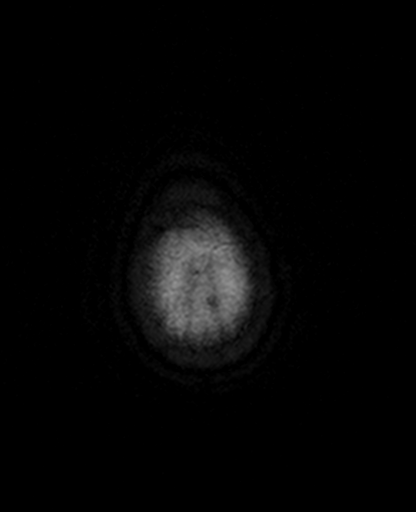

[Series 8: trauma axial · axial · 5.0mm · 0.41mm/px · z∈[-37,+106]mm · 3 of 23 slices shown]
[im 1/23]
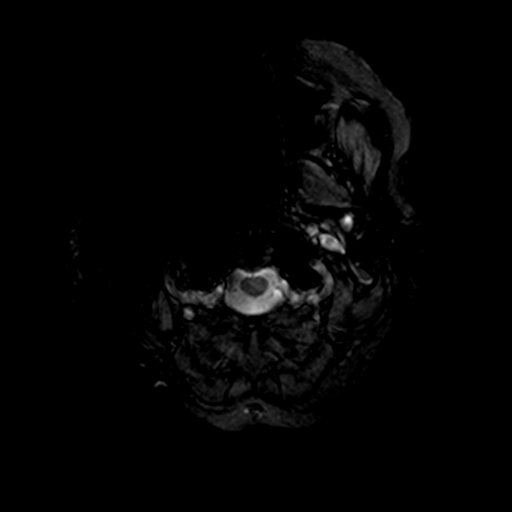
[im 12/23]
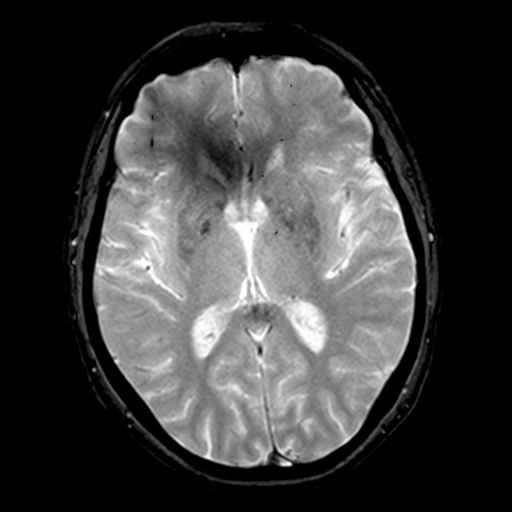
[im 23/23]
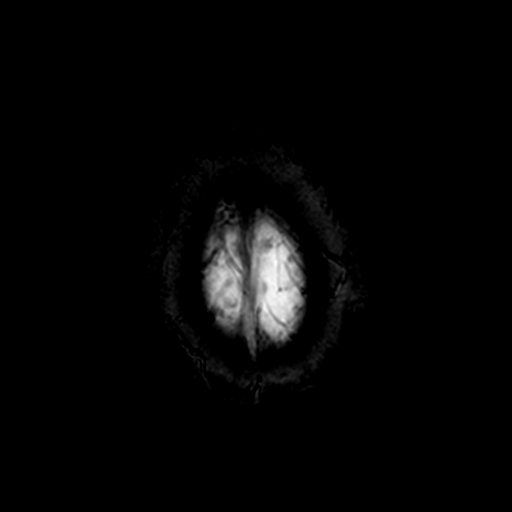

[Series 9: T2 · coronal · 5.0mm · 0.44mm/px · 3 of 28 slices shown (2 of 2)]
[im 1/28]
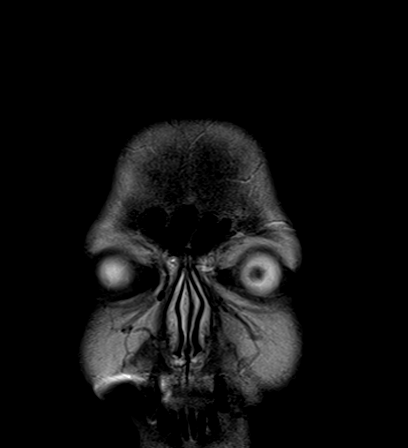
[im 14/28]
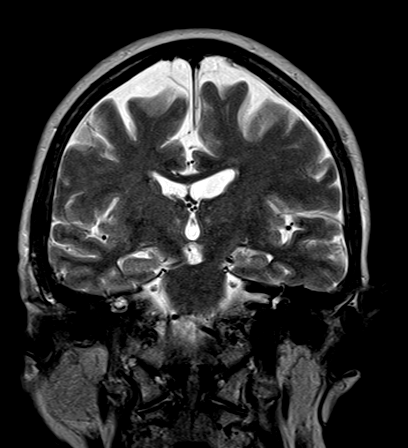
[im 28/28]
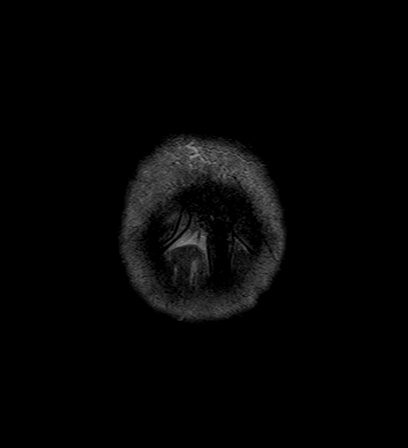

[21 of 48 positions shown; findings below may reference images not displayed]

FINDINGS: MRI HEAD FINDINGS

No acute infarct, hemorrhage, or mass lesion is present. The
ventricles are of normal size. Insert pass fluid. Minimal white
matter changes are stable since prior exam, within normal limits for
age. Flow is present in the major intracranial arteries. The patient
is status post right lens replacement. Left globe and orbit is
intact. The paranasal sinuses and mastoid air cells are clear.
Midline structures are normal. The postcontrast images demonstrate
no pathologic enhancement

MRI ORBITS FINDINGS

Patient is status post right lens replacement. The fat saturation
over the right orbit is degraded by metal artifact in the bowel on
the right. The optic nerves are within normal limits bilaterally.
The optic chiasm is normal. The pituitary and pituitary stalk are
within normal limits.

Extraocular muscles are normal and symmetric. No pathologic
enhancement is evident within the orbits. There is no mass lesion.
IMPRESSION: 1. Normal MRI appearance of the brain for age.
2. Status post right lens replacement.
3. The orbits are otherwise within normal limits bilaterally.

## 2016-10-14 DIAGNOSIS — Z970 Presence of artificial eye: Secondary | ICD-10-CM | POA: Diagnosis not present

## 2016-10-14 DIAGNOSIS — H01021 Squamous blepharitis right upper eyelid: Secondary | ICD-10-CM | POA: Diagnosis not present

## 2016-10-14 DIAGNOSIS — H43812 Vitreous degeneration, left eye: Secondary | ICD-10-CM | POA: Diagnosis not present

## 2016-10-14 DIAGNOSIS — H01022 Squamous blepharitis right lower eyelid: Secondary | ICD-10-CM | POA: Diagnosis not present

## 2016-10-14 DIAGNOSIS — H2512 Age-related nuclear cataract, left eye: Secondary | ICD-10-CM | POA: Diagnosis not present

## 2016-10-14 DIAGNOSIS — H01025 Squamous blepharitis left lower eyelid: Secondary | ICD-10-CM | POA: Diagnosis not present

## 2016-10-14 DIAGNOSIS — H01024 Squamous blepharitis left upper eyelid: Secondary | ICD-10-CM | POA: Diagnosis not present

## 2016-10-25 ENCOUNTER — Ambulatory Visit (INDEPENDENT_AMBULATORY_CARE_PROVIDER_SITE_OTHER): Payer: PPO

## 2016-10-25 DIAGNOSIS — Z23 Encounter for immunization: Secondary | ICD-10-CM | POA: Diagnosis not present

## 2016-11-18 ENCOUNTER — Other Ambulatory Visit: Payer: Self-pay | Admitting: Family Medicine

## 2016-12-10 ENCOUNTER — Other Ambulatory Visit: Payer: Self-pay | Admitting: Family Medicine

## 2016-12-28 ENCOUNTER — Other Ambulatory Visit: Payer: Self-pay | Admitting: Family Medicine

## 2017-01-13 ENCOUNTER — Ambulatory Visit (INDEPENDENT_AMBULATORY_CARE_PROVIDER_SITE_OTHER): Payer: PPO | Admitting: Family Medicine

## 2017-01-13 ENCOUNTER — Encounter: Payer: Self-pay | Admitting: Family Medicine

## 2017-01-13 VITALS — BP 118/74 | HR 87 | Resp 16 | Ht 65.0 in | Wt 125.0 lb

## 2017-01-13 DIAGNOSIS — R351 Nocturia: Secondary | ICD-10-CM

## 2017-01-13 DIAGNOSIS — F32A Depression, unspecified: Secondary | ICD-10-CM

## 2017-01-13 DIAGNOSIS — E559 Vitamin D deficiency, unspecified: Secondary | ICD-10-CM

## 2017-01-13 DIAGNOSIS — M810 Age-related osteoporosis without current pathological fracture: Secondary | ICD-10-CM

## 2017-01-13 DIAGNOSIS — F418 Other specified anxiety disorders: Secondary | ICD-10-CM

## 2017-01-13 DIAGNOSIS — F329 Major depressive disorder, single episode, unspecified: Secondary | ICD-10-CM

## 2017-01-13 DIAGNOSIS — Z1231 Encounter for screening mammogram for malignant neoplasm of breast: Secondary | ICD-10-CM

## 2017-01-13 DIAGNOSIS — E785 Hyperlipidemia, unspecified: Secondary | ICD-10-CM | POA: Diagnosis not present

## 2017-01-13 DIAGNOSIS — I10 Essential (primary) hypertension: Secondary | ICD-10-CM | POA: Diagnosis not present

## 2017-01-13 DIAGNOSIS — F419 Anxiety disorder, unspecified: Secondary | ICD-10-CM

## 2017-01-13 LAB — COMPREHENSIVE METABOLIC PANEL
ALK PHOS: 112 U/L (ref 33–130)
ALT: 15 U/L (ref 6–29)
AST: 23 U/L (ref 10–35)
Albumin: 4.1 g/dL (ref 3.6–5.1)
BUN: 20 mg/dL (ref 7–25)
CO2: 30 mmol/L (ref 20–31)
CREATININE: 0.94 mg/dL — AB (ref 0.60–0.88)
Calcium: 9.8 mg/dL (ref 8.6–10.4)
Chloride: 103 mmol/L (ref 98–110)
GLUCOSE: 88 mg/dL (ref 65–99)
Potassium: 4.3 mmol/L (ref 3.5–5.3)
SODIUM: 139 mmol/L (ref 135–146)
TOTAL PROTEIN: 6.4 g/dL (ref 6.1–8.1)
Total Bilirubin: 0.5 mg/dL (ref 0.2–1.2)

## 2017-01-13 LAB — LIPID PANEL
Cholesterol: 222 mg/dL — ABNORMAL HIGH (ref <200)
HDL: 95 mg/dL (ref >50)
LDL CALC: 105 mg/dL — AB (ref <100)
Total CHOL/HDL Ratio: 2.3 Ratio (ref <5.0)
Triglycerides: 108 mg/dL (ref <150)
VLDL: 22 mg/dL (ref <30)

## 2017-01-13 NOTE — Assessment & Plan Note (Signed)
Unchanged, no med change

## 2017-01-13 NOTE — Addendum Note (Signed)
Addended by: Eual Fines on: 01/13/2017 09:30 AM   Modules accepted: Orders

## 2017-01-13 NOTE — Assessment & Plan Note (Signed)
Controlled, no change in medication  

## 2017-01-13 NOTE — Assessment & Plan Note (Signed)
Reconsidering parenteral treatment. Continue daily weight bearing exercise as well as calcium and vit D supplement

## 2017-01-13 NOTE — Assessment & Plan Note (Signed)
Hyperlipidemia:Low fat diet discussed and encouraged.   Lipid Panel  Lab Results  Component Value Date   CHOL 218 (H) 09/03/2016   HDL 98 09/03/2016   LDLCALC 95 09/03/2016   TRIG 125 09/03/2016   CHOLHDL 2.2 09/03/2016     Updated lab needed at/ before next visit.

## 2017-01-13 NOTE — Patient Instructions (Addendum)
Annual physical exam in 5.5 months   You are referred for mammogram   Fasting lipid cmp, Vit D   Congrats on excellent lifestyle commitment contnue to choose your friends wisely, I know that you treasure them  Thank you  for choosing [facility]. We consider it a privelige to serve you.  Delivering excellent health care in a caring and  compassionate way is our goal.  Partnering with you,  so that together we can achieve this goal is our strategy.   Will get iinfo to you re bone builder as we duiscussed  All the best for 2018!

## 2017-01-13 NOTE — Assessment & Plan Note (Signed)
Controlled, no change in medication DASH diet and commitment to daily physical activity for a minimum of 30 minutes discussed and encouraged, as a part of hypertension management. The importance of attaining a healthy weight is also discussed.  BP/Weight 01/13/2017 09/12/2016 08/30/2016 03/21/2016 09/05/2015 [id number]  Systolic BP 123456 Q000111Q 0000000 [id number] 123456 [id number]  Diastolic BP 74 76 65 72 78 70 80  Wt. (Lbs) 125 125 124.6 130.12 129.12 128.8 127.1  BMI 20.8 20.8 20.73 21.01 21.15 21.1 20.82

## 2017-01-13 NOTE — Progress Notes (Signed)
   YI HINCHMAN     MRN: [id number]      DOB: 1934-12-20   HPI [name] is here for follow up and re-evaluation of chronic medical conditions, medication management and review of any available recent lab and radiology data.  Preventive health is updated, specifically  Cancer screening and Immunization.   Questions or concerns regarding consultations or procedures which the PT has had in the interim are  addressed. The PT denies any adverse reactions to current medications since the last visit.  There are no new concerns.  There are no specific complaints   ROS Denies recent fever or chills. Denies sinus pressure, nasal congestion, ear pain or sore throat. Denies chest congestion, productive cough or wheezing. Denies chest pains, palpitations and leg swelling Denies abdominal pain, nausea, vomiting,diarrhea or constipation.   Denies dysuria, frequency, hesitancy or incontinence. Denies joint pain, swelling and limitation in mobility. Denies headaches, seizures, numbness, or tingling. Denies depression, anxiety or insomnia. Denies skin break down or rash.   PE  BP 118/74   Pulse 87   Resp 16   Ht 5\' 5"  (1.651 m)   Wt 125 lb (56.7 kg)   SpO2 97%   BMI 20.80 kg/m   Patient alert and oriented and in no cardiopulmonary distress.  HEENT: No facial asymmetry, EOMI,   oropharynx pink and moist.  Neck supple no JVD, no mass.  Chest: Clear to auscultation bilaterally.  CVS: S1, S2 no murmurs, no S3.Regular rate.  ABD: Soft non tender.   Ext: No edema  MS: Adequate ROM spine, shoulders, hips and knees.  Skin: Intact, no ulcerations or rash noted.  Psych: Good eye contact, normal affect. Memory intact not anxious or depressed appearing.  CNS: CN 2-12 intact, power,  normal throughout.no focal deficits noted.   Assessment & Plan  Essential hypertension Controlled, no change in medication DASH diet and commitment to daily physical activity for a minimum of 30 minutes  discussed and encouraged, as a part of hypertension management. The importance of attaining a healthy weight is also discussed.  BP/Weight 01/13/2017 09/12/2016 08/30/2016 03/21/2016 09/05/2015 [id number]  Systolic BP 123456 Q000111Q 0000000 [id number] 123456 [id number]  Diastolic BP 74 76 65 72 78 70 80  Wt. (Lbs) 125 125 124.6 130.12 129.12 128.8 127.1  BMI 20.8 20.8 20.73 21.01 21.15 21.1 20.82       Osteoporosis Reconsidering parenteral treatment. Continue daily weight bearing exercise as well as calcium and vit D supplement  Anxiety and depression Controlled, no change in medication   Hyperlipidemia LDL goal <100 Hyperlipidemia:Low fat diet discussed and encouraged.   Lipid Panel  Lab Results  Component Value Date   CHOL 218 (H) 09/03/2016   HDL 98 09/03/2016   LDLCALC 95 09/03/2016   TRIG 125 09/03/2016   CHOLHDL 2.2 09/03/2016     Updated lab needed at/ before next visit.   Nocturia Unchanged, no med change

## 2017-01-14 LAB — VITAMIN D 25 HYDROXY (VIT D DEFICIENCY, FRACTURES): VIT D 25 HYDROXY: 101 ng/mL — AB (ref 30–100)

## 2017-01-28 ENCOUNTER — Other Ambulatory Visit: Payer: Self-pay | Admitting: Family Medicine

## 2017-04-07 ENCOUNTER — Telehealth: Payer: Self-pay | Admitting: Family Medicine

## 2017-04-07 NOTE — Telephone Encounter (Signed)
Patient walked in this morning wanting to be seen for allergy symptoms.  She states she thinks it is allergies because she is coughing sneezing and drainage. This had been going on since this past Friday.  cb # [id number].  Patient uses [facility]

## 2017-04-07 NOTE — Telephone Encounter (Signed)
Called patient back and advised to try robitussin dm and an allergy med like zyrtec and call back in a couple days if not working and could be seen with dr Meda Coffee. Patient understands

## 2017-04-22 DIAGNOSIS — H2512 Age-related nuclear cataract, left eye: Secondary | ICD-10-CM | POA: Diagnosis not present

## 2017-04-22 DIAGNOSIS — H01022 Squamous blepharitis right lower eyelid: Secondary | ICD-10-CM | POA: Diagnosis not present

## 2017-04-22 DIAGNOSIS — H01021 Squamous blepharitis right upper eyelid: Secondary | ICD-10-CM | POA: Diagnosis not present

## 2017-04-22 DIAGNOSIS — H01024 Squamous blepharitis left upper eyelid: Secondary | ICD-10-CM | POA: Diagnosis not present

## 2017-04-22 DIAGNOSIS — Z970 Presence of artificial eye: Secondary | ICD-10-CM | POA: Diagnosis not present

## 2017-04-22 DIAGNOSIS — H01025 Squamous blepharitis left lower eyelid: Secondary | ICD-10-CM | POA: Diagnosis not present

## 2017-05-06 DIAGNOSIS — H01131 Eczematous dermatitis of right upper eyelid: Secondary | ICD-10-CM | POA: Diagnosis not present

## 2017-05-30 ENCOUNTER — Other Ambulatory Visit: Payer: Self-pay | Admitting: Family Medicine

## 2017-06-03 ENCOUNTER — Other Ambulatory Visit: Payer: Self-pay | Admitting: Family Medicine

## 2017-06-26 ENCOUNTER — Telehealth: Payer: Self-pay | Admitting: Family Medicine

## 2017-06-26 DIAGNOSIS — E785 Hyperlipidemia, unspecified: Secondary | ICD-10-CM

## 2017-06-26 NOTE — Telephone Encounter (Signed)
Spoke to patient, informed that labs were ordered and that they were fasting labs. She verbalized understanding.

## 2017-06-26 NOTE — Telephone Encounter (Signed)
Patient is coming in this Monday and would like to know if she is to go for blood work prior to her visit

## 2017-06-26 NOTE — Telephone Encounter (Signed)
Yes pls order fasting lipid, cmp, TSH, cBC and vit D

## 2017-06-27 LAB — CBC
HCT: 41.8 % (ref 35.0–45.0)
HEMOGLOBIN: 13.7 g/dL (ref 11.7–15.5)
MCH: 30.2 pg (ref 27.0–33.0)
MCHC: 32.8 g/dL (ref 32.0–36.0)
MCV: 92.3 fL (ref 80.0–100.0)
MPV: 10.9 fL (ref 7.5–12.5)
Platelets: 293 10*3/uL (ref 140–400)
RBC: 4.53 MIL/uL (ref 3.80–5.10)
RDW: 13.7 % (ref 11.0–15.0)
WBC: 4.6 10*3/uL (ref 3.8–10.8)

## 2017-06-27 LAB — COMPLETE METABOLIC PANEL WITH GFR
ALT: 17 U/L (ref 6–29)
AST: 25 U/L (ref 10–35)
Albumin: 4.2 g/dL (ref 3.6–5.1)
Alkaline Phosphatase: 116 U/L (ref 33–130)
BUN: 16 mg/dL (ref 7–25)
CALCIUM: 9.5 mg/dL (ref 8.6–10.4)
CHLORIDE: 103 mmol/L (ref 98–110)
CO2: 30 mmol/L (ref 20–31)
Creat: 0.92 mg/dL — ABNORMAL HIGH (ref 0.60–0.88)
GFR, Est African American: 68 mL/min (ref >=60)
GFR, Est Non African American: 59 mL/min — ABNORMAL LOW (ref >=60)
Glucose, Bld: 87 mg/dL (ref 65–99)
POTASSIUM: 4.1 mmol/L (ref 3.5–5.3)
Sodium: 140 mmol/L (ref 135–146)
Total Bilirubin: 0.5 mg/dL (ref 0.2–1.2)
Total Protein: 6.9 g/dL (ref 6.1–8.1)

## 2017-06-27 LAB — LIPID PANEL
CHOL/HDL RATIO: 2.2 ratio (ref <5.0)
CHOLESTEROL: 227 mg/dL — AB (ref <200)
HDL: 103 mg/dL (ref >50)
LDL Cholesterol: 98 mg/dL (ref <100)
Triglycerides: 128 mg/dL (ref <150)
VLDL: 26 mg/dL (ref <30)

## 2017-06-27 LAB — TSH: TSH: 3.42 m[IU]/L

## 2017-06-28 LAB — VITAMIN D 25 HYDROXY (VIT D DEFICIENCY, FRACTURES): VIT D 25 HYDROXY: 49 ng/mL (ref 30–100)

## 2017-06-30 ENCOUNTER — Encounter: Payer: Self-pay | Admitting: Family Medicine

## 2017-06-30 ENCOUNTER — Other Ambulatory Visit: Payer: Self-pay | Admitting: Family Medicine

## 2017-06-30 ENCOUNTER — Ambulatory Visit (INDEPENDENT_AMBULATORY_CARE_PROVIDER_SITE_OTHER): Payer: PPO | Admitting: Family Medicine

## 2017-06-30 VITALS — BP 138/78 | HR 73 | Resp 16 | Ht 65.0 in | Wt 126.1 lb

## 2017-06-30 DIAGNOSIS — I10 Essential (primary) hypertension: Secondary | ICD-10-CM | POA: Diagnosis not present

## 2017-06-30 DIAGNOSIS — F5104 Psychophysiologic insomnia: Secondary | ICD-10-CM

## 2017-06-30 DIAGNOSIS — M818 Other osteoporosis without current pathological fracture: Secondary | ICD-10-CM | POA: Diagnosis not present

## 2017-06-30 DIAGNOSIS — E785 Hyperlipidemia, unspecified: Secondary | ICD-10-CM

## 2017-06-30 DIAGNOSIS — F32A Depression, unspecified: Secondary | ICD-10-CM

## 2017-06-30 DIAGNOSIS — L989 Disorder of the skin and subcutaneous tissue, unspecified: Secondary | ICD-10-CM | POA: Diagnosis not present

## 2017-06-30 DIAGNOSIS — K219 Gastro-esophageal reflux disease without esophagitis: Secondary | ICD-10-CM | POA: Diagnosis not present

## 2017-06-30 DIAGNOSIS — F419 Anxiety disorder, unspecified: Secondary | ICD-10-CM

## 2017-06-30 DIAGNOSIS — Z1231 Encounter for screening mammogram for malignant neoplasm of breast: Secondary | ICD-10-CM

## 2017-06-30 DIAGNOSIS — F329 Major depressive disorder, single episode, unspecified: Secondary | ICD-10-CM

## 2017-06-30 NOTE — Progress Notes (Signed)
   Kelsey Nelson     MRN: [id number]      DOB: 02/07/1934   HPI Ms. Butikofer is here for follow up and re-evaluation of chronic medical conditions, medication management and review of any available recent lab and radiology data.  Preventive health is updated, specifically  Cancer screening and Immunization.   Questions or concerns regarding consultations or procedures which the PT has had in the interim are  addressed. The PT denies any adverse reactions to current medications since the last visit.  6 month h/o skin changes with pigment and testure wants derm to re eval , no cancer history  ROS Denies recent fever or chills. Denies sinus pressure, nasal congestion, ear pain or sore throat. Denies chest congestion, productive cough or wheezing. Denies chest pains, palpitations and leg swelling Denies abdominal pain, nausea, vomiting,diarrhea or constipation.   Denies dysuria, frequency, hesitancy or incontinence. Denies joint pain, swelling and limitation in mobility. Denies headaches, seizures, numbness, or tingling. Denies depression, anxiety or insomnia. Denies skin break down or rash.   PE  BP 138/78   Pulse 73   Resp 16   Ht 5\' 5"  (1.651 m)   Wt 126 lb 1.9 oz (57.2 kg)   SpO2 95%   BMI 20.99 kg/m   Patient alert and oriented and in no cardiopulmonary distress.  HEENT: No facial asymmetry, EOMI,   oropharynx pink and moist.  Neck supple no JVD, no mass.  Chest: Clear to auscultation bilaterally.  CVS: S1, S2 no murmurs, no S3.Regular rate.  ABD: Soft non tender.   Ext: No edema  MS: Adequate ROM spine, shoulders, hips and knees.  Skin: Intact, no ulcerations , generalized skin lesions  noted.  Psych: Good eye contact, normal affect. Memory intact not anxious or depressed appearing.  CNS: CN 2-12 intact, power,  normal throughout.no focal deficits noted.   Assessment & Plan  Skin lesions, generalized 6 month h/o increased skin changes, esp on face and back  wants derm eval  Anxiety and depression Controlled, no change in medication   Essential hypertension Controlled, no change in medication DASH diet and commitment to daily physical activity for a minimum of 30 minutes discussed and encouraged, as a part of hypertension management. The importance of attaining a healthy weight is also discussed.  BP/Weight 06/30/2017 01/13/2017 09/12/2016 08/30/2016 03/21/2016 09/05/2015 4/56/2563  Systolic BP 893 734 287 681 157 262 035  Diastolic BP 78 74 76 65 72 78 70  Wt. (Lbs) 126.12 125 125 124.6 130.12 129.12 128.8  BMI 20.99 20.8 20.8 20.73 21.01 21.15 21.1       Hyperlipidemia LDL goal <100 Elevated cholesterol, but good ratio as hDL is excellent Needs to reduce fat in diet and cheese Hyperlipidemia:Low fat diet discussed and encouraged.   Lipid Panel  Lab Results  Component Value Date   CHOL 227 (H) 06/26/2017   HDL 103 06/26/2017   LDLCALC 98 06/26/2017   TRIG 128 06/26/2017   CHOLHDL 2.2 06/26/2017       Insomnia Improved, sleeps on average 6 hrs per night Sleep hygiene reviewed and written information offered also.   GERD Improved , relies on PPI less  Osteoporosis Considering parenteral therapy , holding on rept dexa at this time

## 2017-06-30 NOTE — Assessment & Plan Note (Signed)
6 month h/o increased skin changes, esp on face and back wants derm eval

## 2017-06-30 NOTE — Assessment & Plan Note (Signed)
Elevated cholesterol, but good ratio as hDL is excellent Needs to reduce fat in diet and cheese Hyperlipidemia:Low fat diet discussed and encouraged.   Lipid Panel  Lab Results  Component Value Date   CHOL 227 (H) 06/26/2017   HDL 103 06/26/2017   LDLCALC 98 06/26/2017   TRIG 128 06/26/2017   CHOLHDL 2.2 06/26/2017

## 2017-06-30 NOTE — Assessment & Plan Note (Signed)
Controlled, no change in medication DASH diet and commitment to daily physical activity for a minimum of 30 minutes discussed and encouraged, as a part of hypertension management. The importance of attaining a healthy weight is also discussed.  BP/Weight 06/30/2017 01/13/2017 09/12/2016 08/30/2016 03/21/2016 09/05/2015 04/06/8308  Systolic BP 407 680 881 103 159 458 592  Diastolic BP 78 74 76 65 72 78 70  Wt. (Lbs) 126.12 125 125 124.6 130.12 129.12 128.8  BMI 20.99 20.8 20.8 20.73 21.01 21.15 21.1

## 2017-06-30 NOTE — Assessment & Plan Note (Signed)
Controlled, no change in medication  

## 2017-06-30 NOTE — Patient Instructions (Signed)
Annual exam in 4 month, call if you need me before  No medication changes  Reduce cheese intake and fatty food intake to improve  Cholesterol please  CONGRATS on excellent labs otherwise and regular walking  Please get mammogram appt at checkout , past due  Consider and think about medication for osteoporosis  Thank you  for choosing [facility]. We consider it a privelige to serve you.  Delivering excellent health care in a caring and  compassionate way is our goal.  Partnering with you,  so that together we can achieve this goal is our strategy.

## 2017-06-30 NOTE — Assessment & Plan Note (Signed)
Improved , relies on PPI less

## 2017-06-30 NOTE — Assessment & Plan Note (Signed)
Improved, sleeps on average 6 hrs per night Sleep hygiene reviewed and written information offered also.

## 2017-06-30 NOTE — Assessment & Plan Note (Signed)
Considering parenteral therapy , holding on rept dexa at this time

## 2017-07-07 ENCOUNTER — Ambulatory Visit (HOSPITAL_COMMUNITY)
Admission: RE | Admit: 2017-07-07 | Discharge: 2017-07-07 | Disposition: A | Discharge status: Home / Self Care | Payer: PPO | Source: Ambulatory Visit | Attending: Family Medicine | Admitting: Family Medicine

## 2017-07-07 DIAGNOSIS — Z1231 Encounter for screening mammogram for malignant neoplasm of breast: Secondary | ICD-10-CM | POA: Diagnosis not present

## 2017-07-10 DIAGNOSIS — Z4421 Encounter for fitting and adjustment of artificial right eye: Secondary | ICD-10-CM | POA: Diagnosis not present

## 2017-07-16 ENCOUNTER — Other Ambulatory Visit: Payer: Self-pay | Admitting: Family Medicine

## 2017-07-17 ENCOUNTER — Telehealth: Payer: Self-pay

## 2017-07-17 NOTE — Telephone Encounter (Signed)
Called pt to schedule Medicare Annual Wellness Visit. -nr  

## 2017-07-25 ENCOUNTER — Encounter (INDEPENDENT_AMBULATORY_CARE_PROVIDER_SITE_OTHER): Payer: Self-pay | Admitting: Internal Medicine

## 2017-07-29 DIAGNOSIS — L820 Inflamed seborrheic keratosis: Secondary | ICD-10-CM | POA: Diagnosis not present

## 2017-07-29 DIAGNOSIS — H01119 Allergic dermatitis of unspecified eye, unspecified eyelid: Secondary | ICD-10-CM | POA: Diagnosis not present

## 2017-07-29 DIAGNOSIS — L821 Other seborrheic keratosis: Secondary | ICD-10-CM | POA: Diagnosis not present

## 2017-07-29 DIAGNOSIS — D2261 Melanocytic nevi of right upper limb, including shoulder: Secondary | ICD-10-CM | POA: Diagnosis not present

## 2017-07-29 DIAGNOSIS — H00012 Hordeolum externum right lower eyelid: Secondary | ICD-10-CM | POA: Diagnosis not present

## 2017-09-01 ENCOUNTER — Ambulatory Visit (INDEPENDENT_AMBULATORY_CARE_PROVIDER_SITE_OTHER): Payer: PPO | Admitting: Internal Medicine

## 2017-09-08 DIAGNOSIS — Z4421 Encounter for fitting and adjustment of artificial right eye: Secondary | ICD-10-CM | POA: Diagnosis not present

## 2017-11-06 ENCOUNTER — Encounter: Payer: Self-pay | Admitting: Family Medicine

## 2017-11-06 ENCOUNTER — Ambulatory Visit (INDEPENDENT_AMBULATORY_CARE_PROVIDER_SITE_OTHER): Payer: PPO | Admitting: Family Medicine

## 2017-11-06 VITALS — BP 140/84 | HR 75 | Resp 16 | Ht 65.0 in | Wt 129.0 lb

## 2017-11-06 DIAGNOSIS — Z1211 Encounter for screening for malignant neoplasm of colon: Secondary | ICD-10-CM

## 2017-11-06 DIAGNOSIS — Z0000 Encounter for general adult medical examination without abnormal findings: Secondary | ICD-10-CM | POA: Diagnosis not present

## 2017-11-06 DIAGNOSIS — J309 Allergic rhinitis, unspecified: Secondary | ICD-10-CM

## 2017-11-06 DIAGNOSIS — I10 Essential (primary) hypertension: Secondary | ICD-10-CM | POA: Diagnosis not present

## 2017-11-06 DIAGNOSIS — E785 Hyperlipidemia, unspecified: Secondary | ICD-10-CM

## 2017-11-06 LAB — COMPLETE METABOLIC PANEL WITH GFR
AG Ratio: 1.7 (calc) (ref 1.0–2.5)
ALKALINE PHOSPHATASE (APISO): 113 U/L (ref 33–130)
ALT: 18 U/L (ref 6–29)
AST: 23 U/L (ref 10–35)
Albumin: 4.5 g/dL (ref 3.6–5.1)
BILIRUBIN TOTAL: 0.6 mg/dL (ref 0.2–1.2)
BUN: 20 mg/dL (ref 7–25)
CHLORIDE: 103 mmol/L (ref 98–110)
CO2: 32 mmol/L (ref 20–32)
Calcium: 9.5 mg/dL (ref 8.6–10.4)
Creat: 0.84 mg/dL (ref 0.60–0.88)
GFR, Est African American: 76 mL/min/{1.73_m2} (ref >=60)
GFR, Est Non African American: 65 mL/min/{1.73_m2} (ref >=60)
GLUCOSE: 89 mg/dL (ref 65–99)
Globulin: 2.6 g/dL (calc) (ref 1.9–3.7)
Potassium: 4.1 mmol/L (ref 3.5–5.3)
Sodium: 141 mmol/L (ref 135–146)
Total Protein: 7.1 g/dL (ref 6.1–8.1)

## 2017-11-06 LAB — POC HEMOCCULT BLD/STL (OFFICE/1-CARD/DIAGNOSTIC): Fecal Occult Blood, POC: NEGATIVE

## 2017-11-06 LAB — LIPID PANEL
CHOLESTEROL: 286 mg/dL — AB (ref <200)
HDL: 119 mg/dL (ref >50)
LDL CHOLESTEROL (CALC): 145 mg/dL — AB
Non-HDL Cholesterol (Calc): 167 mg/dL (calc) — ABNORMAL HIGH (ref <130)
TRIGLYCERIDES: 103 mg/dL (ref <150)
Total CHOL/HDL Ratio: 2.4 (calc) (ref <5.0)

## 2017-11-06 MED ORDER — FLUTICASONE PROPIONATE 50 MCG/ACT NA SUSP: 1.0000 | Freq: Every day | NASAL | 2 refills | Status: DC

## 2017-11-06 NOTE — Patient Instructions (Addendum)
Wellness visit with nurse in January  MD f/u in 6 months  Lipid, cmp and EGFr and  today  New for allergies  flonase spray, also use saline flushes to decongest nostril  It is important that you exercise regularly at least 30 minutes 5 times a week. If you develop chest pain, have severe difficulty breathing, or feel very tired, stop exercising immediately and seek medical attention         Fall Prevention in the Home Falls can cause injuries. They can happen to people of all ages. There are many things you can do to make your home safe and to help prevent falls. What can I do on the outside of my home?  Regularly fix the edges of walkways and driveways and fix any cracks.  Remove anything that might make you trip as you walk through a door, such as a raised step or threshold.  Trim any bushes or trees on the path to your home.  Use bright outdoor lighting.  Clear any walking paths of anything that might make someone trip, such as rocks or tools.  Regularly check to see if handrails are loose or broken. Make sure that both sides of any steps have handrails.  Any raised decks and porches should have guardrails on the edges.  Have any leaves, snow, or ice cleared regularly.  Use sand or salt on walking paths during winter.  Clean up any spills in your garage right away. This includes oil or grease spills. What can I do in the bathroom?  Use night lights.  Install grab bars by the toilet and in the tub and shower. Do not use towel bars as grab bars.  Use non-skid mats or decals in the tub or shower.  If you need to sit down in the shower, use a plastic, non-slip stool.  Keep the floor dry. Clean up any water that spills on the floor as soon as it happens.  Remove soap buildup in the tub or shower regularly.  Attach bath mats securely with double-sided non-slip rug tape.  Do not have throw rugs and other things on the floor that can make you trip. What can I do in  the bedroom?  Use night lights.  Make sure that you have a light by your bed that is easy to reach.  Do not use any sheets or blankets that are too big for your bed. They should not hang down onto the floor.  Have a firm chair that has side arms. You can use this for support while you get dressed.  Do not have throw rugs and other things on the floor that can make you trip. What can I do in the kitchen?  Clean up any spills right away.  Avoid walking on wet floors.  Keep items that you use a lot in easy-to-reach places.  If you need to reach something above you, use a strong step stool that has a grab bar.  Keep electrical cords out of the way.  Do not use floor polish or wax that makes floors slippery. If you must use wax, use non-skid floor wax.  Do not have throw rugs and other things on the floor that can make you trip. What can I do with my stairs?  Do not leave any items on the stairs.  Make sure that there are handrails on both sides of the stairs and use them. Fix handrails that are broken or loose. Make sure that handrails are as  long as the stairways.  Check any carpeting to make sure that it is firmly attached to the stairs. Fix any carpet that is loose or worn.  Avoid having throw rugs at the top or bottom of the stairs. If you do have throw rugs, attach them to the floor with carpet tape.  Make sure that you have a light switch at the top of the stairs and the bottom of the stairs. If you do not have them, ask someone to add them for you. What else can I do to help prevent falls?  Wear shoes that: ? Do not have high heels. ? Have rubber bottoms. ? Are comfortable and fit you well. ? Are closed at the toe. Do not wear sandals.  If you use a stepladder: ? Make sure that it is fully opened. Do not climb a closed stepladder. ? Make sure that both sides of the stepladder are locked into place. ? Ask someone to hold it for you, if possible.  Clearly mark and  make sure that you can see: ? Any grab bars or handrails. ? First and last steps. ? Where the edge of each step is.  Use tools that help you move around (mobility aids) if they are needed. These include: ? Canes. ? Walkers. ? Scooters. ? Crutches.  Turn on the lights when you go into a dark area. Replace any light bulbs as soon as they burn out.  Set up your furniture so you have a clear path. Avoid moving your furniture around.  If any of your floors are uneven, fix them.  If there are any pets around you, be aware of where they are.  Review your medicines with your doctor. Some medicines can make you feel dizzy. This can increase your chance of falling. Ask your doctor what other things that you can do to help prevent falls. This information is not intended to replace advice given to you by your health care provider. Make sure you discuss any questions you have with your health care provider. Document Released: 10/05/2009 Document Revised: 05/16/2016 Document Reviewed: 01/13/2015 Elsevier Interactive Patient Education  [employer].

## 2017-11-08 DIAGNOSIS — J309 Allergic rhinitis, unspecified: Secondary | ICD-10-CM

## 2017-11-08 HISTORY — DX: Allergic rhinitis, unspecified: J30.9

## 2017-11-08 NOTE — Assessment & Plan Note (Signed)
Increased symptoms of nasal congestion and drainage, start daily flonase as needed and saline flushes Technique of appropriate use discussed

## 2017-11-08 NOTE — Progress Notes (Signed)
    Kelsey Nelson     MRN: [id number]      DOB: 31-Aug-1934  HPI: Patient is in for annual physical exam. C/o increased nasal congestion and post nasal drainage, denies fever, chills ear pressure or sore throat, denies ear pain Labs are reviewed after the visit as they are drawn on day of visit Immunization is reviewed , and  updated if needed.   PE: BP 140/84   Pulse 75   Resp 16   Ht 5\' 5"  (1.651 m)   Wt 129 lb (58.5 kg)   SpO2 96%   BMI 21.47 kg/m   Pleasant  female, alert and oriented x 3, in no cardio-pulmonary distress. Afebrile. HEENT No facial trauma or asymetry. Sinuses non tender. Nasal mucosa erythematous and edematous Extra occullar muscles intact. External ears normal, tympanic membranes clear. Oropharynx moist, no exudate. Neck: supple, no adenopathy,JVD or thyromegaly.No bruits.  Chest: Clear to ascultation bilaterally.No crackles or wheezes. Non tender to palpation  Breast: No asymetry,no masses or lumps. No tenderness. No nipple discharge or inversion. No axillary or supraclavicular adenopathy  Cardiovascular system; Heart sounds normal,  S1 and  S2 ,no S3.  No murmur, or thrill. Apical beat not displaced Peripheral pulses normal.  Abdomen: Soft, non tender, no organomegaly or masses. No bruits. Bowel sounds normal. No guarding, tenderness or rebound.  Rectal:  Normal sphincter tone. No rectal mass. Guaiac negative stool.  GU: Not examined   Musculoskeletal exam: Full ROM of spine, hips , shoulders and knees. No deformity ,swelling or crepitus noted. No muscle wasting or atrophy.   Neurologic: Cranial nerves 2 to 12 intact. Power, tone ,sensation and reflexes normal throughout. No disturbance in gait. No tremor.  Skin: Intact, no ulceration, erythema , scaling or rash noted. Pigmentation normal throughout  Psych; Normal mood and affect. Judgement and concentration normal   Assessment & Plan:  Annual physical exam Annual exam  as documented. Counseling done  re healthy lifestyle involving commitment to 150 minutes exercise per week, heart healthy diet, and attaining healthy weight.The importance of adequate sleep also discussed. Regular seat belt use and home safety, is also discussed. Changes in health habits are decided on by the patient with goals and time frames  set for achieving them. Immunization and cancer screening needs are specifically addressed at this visit.   Allergic rhinitis Increased symptoms of nasal congestion and drainage, start daily flonase as needed and saline flushes Technique of appropriate use discussed

## 2017-11-08 NOTE — Assessment & Plan Note (Signed)

## 2017-11-26 ENCOUNTER — Telehealth: Payer: Self-pay

## 2017-11-26 DIAGNOSIS — I10 Essential (primary) hypertension: Secondary | ICD-10-CM

## 2017-11-26 DIAGNOSIS — E785 Hyperlipidemia, unspecified: Secondary | ICD-10-CM

## 2017-11-26 NOTE — Telephone Encounter (Signed)
-----   Message from Fayrene Helper, MD sent at 11/08/2017  2:57 PM EST ----- Your liver and kidney function and blood sugar are normal Your total and bad cholesterol are too high, and have increased a lot. I recommend that you REDUCE fried and fatty foods, increase vegetable and fruit. I also recommend increasing dose  Of pravacho to 40 mg and repeating fasting lipid and cmp  In 4.5 months Please let me know if t you agree

## 2017-11-27 ENCOUNTER — Other Ambulatory Visit: Payer: Self-pay | Admitting: Family Medicine

## 2017-11-27 NOTE — Telephone Encounter (Signed)
Seen 11 15 18 

## 2017-11-28 DIAGNOSIS — S8992XA Unspecified injury of left lower leg, initial encounter: Secondary | ICD-10-CM | POA: Insufficient documentation

## 2017-11-28 DIAGNOSIS — M1712 Unilateral primary osteoarthritis, left knee: Secondary | ICD-10-CM | POA: Diagnosis not present

## 2017-11-28 DIAGNOSIS — M7989 Other specified soft tissue disorders: Secondary | ICD-10-CM | POA: Diagnosis not present

## 2017-11-28 DIAGNOSIS — M25562 Pain in left knee: Secondary | ICD-10-CM | POA: Diagnosis not present

## 2017-11-28 DIAGNOSIS — Z9181 History of falling: Secondary | ICD-10-CM | POA: Diagnosis not present

## 2017-11-28 DIAGNOSIS — S8002XA Contusion of left knee, initial encounter: Secondary | ICD-10-CM | POA: Diagnosis not present

## 2017-12-03 ENCOUNTER — Other Ambulatory Visit: Payer: Self-pay | Admitting: Pharmacy Technician

## 2017-12-03 NOTE — Patient Outreach (Signed)
[facility]) Care Management  12/03/2017  Kelsey Nelson [birth year] [id number]  Incoming [employer] EMMI call in reference to medication adherence. HIPAA identifiers verified and verbal consent received. Patient was contacted about Pravastatin 20 mg which she states she takes daily as prescribed. She admits that she may miss a dose occasionally but not too often. She does not currently have any barriers that prevent her from taking her medications.  Doreene Burke, [place] [phone]

## 2018-01-05 ENCOUNTER — Ambulatory Visit (INDEPENDENT_AMBULATORY_CARE_PROVIDER_SITE_OTHER): Payer: PPO

## 2018-01-05 VITALS — BP 118/68 | HR 88 | Temp 98.6°F | Resp 16 | Ht 65.5 in | Wt 129.0 lb

## 2018-01-05 DIAGNOSIS — Z0000 Encounter for general adult medical examination without abnormal findings: Secondary | ICD-10-CM

## 2018-01-05 NOTE — Patient Instructions (Signed)
Kelsey Nelson , Thank you for taking time to come for your Medicare Wellness Visit. I appreciate your ongoing commitment to your health goals. Please review the following plan we discussed and let me know if I can assist you in the future.   Screening recommendations/referrals: Colonoscopy: Discuss need for colonoscopy with Dr. Moshe Cipro at next visit Mammogram: Due 06/2018 Bone Density: Done Recommended yearly ophthalmology/optometry visit for glaucoma screening and checkup Recommended yearly dental visit for hygiene and checkup  Vaccinations: Influenza vaccine: Due 09/2018 Pneumococcal vaccine: Due 2020 Tdap vaccine: Due 2026 Shingles vaccine: You have had Zostavax. Please consider updating with Shingrix. Discuss with Dr. Moshe Cipro    Advanced directives: Discussed today in office  Conditions/risks identified: Fall  Next appointment: 05/06/2018   Preventive Care 65 Years and Older, Female Preventive care refers to lifestyle choices and visits with your health care provider that can promote health and wellness. What does preventive care include?  A yearly physical exam. This is also called an annual well check.  Dental exams once or twice a year.  Routine eye exams. Ask your health care provider how often you should have your eyes checked.  Personal lifestyle choices, including:  Daily care of your teeth and gums.  Regular physical activity.  Eating a healthy diet.  Avoiding tobacco and drug use.  Limiting alcohol use.  Practicing safe sex.  Taking low-dose aspirin every day.  Taking vitamin and mineral supplements as recommended by your health care provider. What happens during an annual well check? The services and screenings done by your health care provider during your annual well check will depend on your age, overall health, lifestyle risk factors, and family history of disease. Counseling  Your health care provider may ask you questions about your:  Alcohol  use.  Tobacco use.  Drug use.  Emotional well-being.  Home and relationship well-being.  Sexual activity.  Eating habits.  History of falls.  Memory and ability to understand (cognition).  Work and work Statistician.  Reproductive health. Screening  You may have the following tests or measurements:  Height, weight, and BMI.  Blood pressure.  Lipid and cholesterol levels. These may be checked every 5 years, or more frequently if you are over 93 years old.  Skin check.  Lung cancer screening. You may have this screening every year starting at age 19 if you have a 30-pack-year history of smoking and currently smoke or have quit within the past 15 years.  Fecal occult blood test (FOBT) of the stool. You may have this test every year starting at age 1.  Flexible sigmoidoscopy or colonoscopy. You may have a sigmoidoscopy every 5 years or a colonoscopy every 10 years starting at age 49.  Hepatitis C blood test.  Hepatitis B blood test.  Sexually transmitted disease (STD) testing.  Diabetes screening. This is done by checking your blood sugar (glucose) after you have not eaten for a while (fasting). You may have this done every 1-3 years.  Bone density scan. This is done to screen for osteoporosis. You may have this done starting at age 68.  Mammogram. This may be done every 1-2 years. Talk to your health care provider about how often you should have regular mammograms. Talk with your health care provider about your test results, treatment options, and if necessary, the need for more tests. Vaccines  Your health care provider may recommend certain vaccines, such as:  Influenza vaccine. This is recommended every year.  Tetanus, diphtheria, and acellular pertussis (Tdap,  Td) vaccine. You may need a Td booster every 10 years.  Zoster vaccine. You may need this after age 63.  Pneumococcal 13-valent conjugate (PCV13) vaccine. One dose is recommended after age  25.  Pneumococcal polysaccharide (PPSV23) vaccine. One dose is recommended after age 35. Talk to your health care provider about which screenings and vaccines you need and how often you need them. This information is not intended to replace advice given to you by your health care provider. Make sure you discuss any questions you have with your health care provider. Document Released: 01/05/2016 Document Revised: 08/28/2016 Document Reviewed: 10/10/2015 Elsevier Interactive Patient Education  2017 [place] Prevention in the Home Falls can cause injuries. They can happen to people of all ages. There are many things you can do to make your home safe and to help prevent falls. What can I do on the outside of my home?  Regularly fix the edges of walkways and driveways and fix any cracks.  Remove anything that might make you trip as you walk through a door, such as a raised step or threshold.  Trim any bushes or trees on the path to your home.  Use bright outdoor lighting.  Clear any walking paths of anything that might make someone trip, such as rocks or tools.  Regularly check to see if handrails are loose or broken. Make sure that both sides of any steps have handrails.  Any raised decks and porches should have guardrails on the edges.  Have any leaves, snow, or ice cleared regularly.  Use sand or salt on walking paths during winter.  Clean up any spills in your garage right away. This includes oil or grease spills. What can I do in the bathroom?  Use night lights.  Install grab bars by the toilet and in the tub and shower. Do not use towel bars as grab bars.  Use non-skid mats or decals in the tub or shower.  If you need to sit down in the shower, use a plastic, non-slip stool.  Keep the floor dry. Clean up any water that spills on the floor as soon as it happens.  Remove soap buildup in the tub or shower regularly.  Attach bath mats securely with double-sided  non-slip rug tape.  Do not have throw rugs and other things on the floor that can make you trip. What can I do in the bedroom?  Use night lights.  Make sure that you have a light by your bed that is easy to reach.  Do not use any sheets or blankets that are too big for your bed. They should not hang down onto the floor.  Have a firm chair that has side arms. You can use this for support while you get dressed.  Do not have throw rugs and other things on the floor that can make you trip. What can I do in the kitchen?  Clean up any spills right away.  Avoid walking on wet floors.  Keep items that you use a lot in easy-to-reach places.  If you need to reach something above you, use a strong step stool that has a grab bar.  Keep electrical cords out of the way.  Do not use floor polish or wax that makes floors slippery. If you must use wax, use non-skid floor wax.  Do not have throw rugs and other things on the floor that can make you trip. What can I do with my stairs?  Do not  leave any items on the stairs.  Make sure that there are handrails on both sides of the stairs and use them. Fix handrails that are broken or loose. Make sure that handrails are as long as the stairways.  Check any carpeting to make sure that it is firmly attached to the stairs. Fix any carpet that is loose or worn.  Avoid having throw rugs at the top or bottom of the stairs. If you do have throw rugs, attach them to the floor with carpet tape.  Make sure that you have a light switch at the top of the stairs and the bottom of the stairs. If you do not have them, ask someone to add them for you. What else can I do to help prevent falls?  Wear shoes that:  Do not have high heels.  Have rubber bottoms.  Are comfortable and fit you well.  Are closed at the toe. Do not wear sandals.  If you use a stepladder:  Make sure that it is fully opened. Do not climb a closed stepladder.  Make sure that both  sides of the stepladder are locked into place.  Ask someone to hold it for you, if possible.  Clearly mark and make sure that you can see:  Any grab bars or handrails.  First and last steps.  Where the edge of each step is.  Use tools that help you move around (mobility aids) if they are needed. These include:  Canes.  Walkers.  Scooters.  Crutches.  Turn on the lights when you go into a dark area. Replace any light bulbs as soon as they burn out.  Set up your furniture so you have a clear path. Avoid moving your furniture around.  If any of your floors are uneven, fix them.  If there are any pets around you, be aware of where they are.  Review your medicines with your doctor. Some medicines can make you feel dizzy. This can increase your chance of falling. Ask your doctor what other things that you can do to help prevent falls. This information is not intended to replace advice given to you by your health care provider. Make sure you discuss any questions you have with your health care provider. Document Released: 10/05/2009 Document Revised: 05/16/2016 Document Reviewed: 01/13/2015 Elsevier Interactive Patient Education  2017 [employer].

## 2018-01-05 NOTE — Progress Notes (Signed)
Subjective:   Kelsey Nelson is a 84 y.o. female who presents for Medicare Annual (Subsequent) preventive examination.  Review of Systems:   Cardiac Risk Factors include: advanced age (>25men, >70 women)     Objective:     Vitals: BP 118/68 (BP Location: Left Arm, Patient Position: Sitting, Cuff Size: Normal)   Pulse 88   Temp 98.6 F (37 C) (Other (Comment))   Resp 16   Ht 5' 5.5" (1.664 m)   Wt 129 lb (58.5 kg)   SpO2 98%   BMI 21.14 kg/m   Body mass index is 21.14 kg/m.  Advanced Directives 01/05/2018 07/22/2015 05/04/2015 12/08/2013  Does Patient Have a Medical Advance Directive? Yes Yes No Patient has advance directive, copy not in chart  Type of Advance Directive - - - [place];Living will  Copy of [place] in Chart? - Yes - Copy requested from family  Would patient like information on creating a medical advance directive? - - No - patient declined information -  Pre-existing out of facility DNR order (yellow form or pink MOST form) - - - No    Tobacco Social History   Tobacco Use  Smoking Status Never Smoker  Smokeless Tobacco Never Used     Counseling given: Not Answered   Clinical Intake:  Pre-visit preparation completed: Yes  Pain : No/denies pain Pain Score: 0-No pain     Diabetes: No  How often do you need to have someone help you when you read instructions, pamphlets, or other written materials from your doctor or pharmacy?: 1 - Never  Interpreter Needed?: No     Past Medical History:  Diagnosis Date  . Blindness of right eye   . BMI between 19-24,adult 2010 145 LBS  . Diverticula of small intestine Dec 2010  . Diverticulosis    [place]  . Functional constipation 2010  . GERD (gastroesophageal reflux disease)   . HTN (hypertension)   . Hyperlipemia   . Right eye trauma 1949   Loss of vision resulted at age 9  . Schatzki's ring Dec 2010   Past Surgical History:  Procedure Laterality Date  .  ABDOMINAL HYSTERECTOMY    . BREAST REDUCTION SURGERY  1992 BIL  . COLONOSCOPY  DEC 2010   SIMPLE ADENOMAS (6-8), [place] TICS, SML IH  . COLONOSCOPY N/A 12/08/2013   Procedure: COLONOSCOPY;  Surgeon: Rogene Houston, MD;  [facility];  Service: Endoscopy;  Laterality: N/A;  225  . EVISCERATION Right 05/08/2015   Procedure: EVISCERATION WITH IMPLANT RIGHT EYE;  Surgeon: Williams Che, MD;  Location: [facility];  Service: Ophthalmology;  Laterality: Right;  . EYE SURGERY  RIGHT 2005  . EYE SURGERY  right 2016   removed and false eye placed in 08/2015  . TOTAL ABDOMINAL HYSTERECTOMY W/ BILATERAL SALPINGOOPHORECTOMY  1985 FIBROIDS  . UPPER GASTROINTESTINAL ENDOSCOPY  DEC 2010   Bx-MILD GASTRITIS/DUODENITIS, DUODENAL DIVERTICULA, SCHATZKI'S RING   Family History  Problem Relation Age of Onset  . Pancreatic cancer Sister   . Colon cancer Paternal Aunt   . Colon polyps Neg Hx    Social History   Socioeconomic History  . Marital status: Married    Spouse name: None  . Number of children: None  . Years of education: None  . Highest education level: None  Social Needs  . Financial resource strain: Not hard at all  . Food insecurity - worry: Never true  . Food insecurity -  inability: Never true  . Transportation needs - medical: No  . Transportation needs - non-medical: No  Occupational History  . None  Tobacco Use  . Smoking status: Never Smoker  . Smokeless tobacco: Never Used  Substance and Sexual Activity  . Alcohol use: No    Alcohol/week: 0.0 oz  . Drug use: No  . Sexual activity: No  Other Topics Concern  . None  Social History Narrative   MARRIED TO DAVID Huy. 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/    Outpatient Encounter Medications as of 01/05/2018  Medication Sig  . aspirin 81 MG tablet Take 81 mg by mouth daily.   . fluticasone (FLONASE) 50 MCG/ACT nasal spray Place 1 spray daily into both nostrils.  . Multiple Vitamin (MULTI-VITAMIN DAILY PO) Take by mouth daily.  doTerra Women -  Bone Nutireint  . omeprazole (PRILOSEC) 20 MG capsule TAKE 1 CAPSULE BY MOUTH DAILY AS NEEDED  . pravastatin (PRAVACHOL) 20 MG tablet TAKE 1 TABLET(20 MG) BY MOUTH EVERY EVENING  . ramipril (ALTACE) 5 MG capsule TAKE 1 CAPSULE(5 MG) BY MOUTH DAILY  . OVER THE COUNTER MEDICATION As needed  . sertraline (ZOLOFT) 50 MG tablet TAKE 1 TABLET BY MOUTH DAILY (Patient not taking: Reported on 01/05/2018)  . [DISCONTINUED] ramipril (ALTACE) 5 MG capsule TAKE 1 CAPSULE(5 MG) BY MOUTH DAILY   No facility-administered encounter medications on file as of 01/05/2018.     Activities of Daily Living In your present state of health, do you have any difficulty performing the following activities: 01/05/2018  Hearing? N  Vision? Y  Difficulty concentrating or making decisions? Y  Walking or climbing stairs? N  Dressing or bathing? N  Doing errands, shopping? N  Preparing Food and eating ? N  Using the Toilet? N  In the past six months, have you accidently leaked urine? N  Do you have problems with loss of bowel control? N  Managing your Medications? N  Managing your Finances? N  Housekeeping or managing your Housekeeping? N  Some recent data might be hidden    Patient Care Team: Fayrene Helper, MD as PCP - General (Family Medicine) Danie Binder, MD (Gastroenterology) Rogene Houston, MD (Gastroenterology) Virgia Land, MD as Referring Physician (Internal Medicine)    Assessment:   This is a routine wellness examination for Kelsey Nelson.  Exercise Activities and Dietary recommendations Current Exercise Habits: Home exercise routine, Type of exercise: walking, Time (Minutes): 60, Frequency (Times/Week): 5, Weekly Exercise (Minutes/Week): 300, Intensity: Moderate, Exercise limited by: None identified  Goals    None      Fall Risk Fall Risk  01/05/2018 01/13/2017 03/21/2016  Falls in the past year? No No No   Is the patient's home free of loose throw rugs in walkways, pet  beds, electrical cords, etc?   yes      Grab bars in the bathroom? no      Handrails on the stairs?   No stairs in home      Adequate lighting?   yes   Depression Screen PHQ 2/9 Scores 01/05/2018 01/13/2017 03/21/2016  PHQ - 2 Score 0 0 1  PHQ- 9 Score - - 6     Cognitive Function     6CIT Screen 01/05/2018  What Year? 0 points  What month? 0 points  What time? 0 points  Count back from 20 0 points  Months in reverse 0 points  Repeat phrase 0 points  Total Score 0    Immunization History  Administered Date(s) Administered  . Influenza Split 09/24/2012, 10/19/2015  . Influenza Whole 12/31/2006, 11/06/2009, 10/29/2011  . Influenza,inj,Quad PF,6+ Mos 10/26/2013, 12/05/2014, 10/25/2016  . Influenza-Unspecified 10/06/2017  . Pneumococcal Conjugate-13 07/11/2014  . Pneumococcal Polysaccharide-23 12/23/2001  . Td 08/06/2004  . Tdap 06/05/2015  . Zoster 04/16/2007    Qualifies for Shingles Vaccine? Yes, patient has had initial Zostavax. Will discuss updating to Shingrix with PCP  Screening Tests Health Maintenance  Topic Date Due  . TETANUS/TDAP  06/04/2025  . INFLUENZA VACCINE  Completed  . DEXA SCAN  Completed  . PNA vac Low Risk Adult  Completed    Cancer Screenings: Lung: Low Dose CT Chest recommended if Age 91-80 years, 30 pack-year currently smoking OR have quit w/in 15years. Patient does not qualify. Breast:  Up to date on Mammogram? Yes   Up to date of Bone Density/Dexa? Yes Colorectal: Patient will discuss need for colonoscopy with PCP      Plan:      I have personally reviewed and noted the following in the patient's chart:   . Medical and social history . Use of alcohol, tobacco or illicit drugs  . Current medications and supplements . Functional ability and status . Nutritional status . Physical activity . Advanced directives . List of other physicians . Hospitalizations, surgeries, and ER visits in previous 12 months . Vitals . Screenings to  include cognitive, depression, and falls . Referrals and appointments  In addition, I have reviewed and discussed with patient certain preventive protocols, quality metrics, and best practice recommendations. A written personalized care plan for preventive services as well as general preventive health recommendations were provided to patient.     Merceda Elks, LPN  04/07/3844

## 2018-02-03 ENCOUNTER — Ambulatory Visit: Payer: PPO | Admitting: Family Medicine

## 2018-02-05 ENCOUNTER — Ambulatory Visit (INDEPENDENT_AMBULATORY_CARE_PROVIDER_SITE_OTHER): Payer: PPO | Admitting: Internal Medicine

## 2018-02-05 ENCOUNTER — Encounter (INDEPENDENT_AMBULATORY_CARE_PROVIDER_SITE_OTHER): Payer: Self-pay | Admitting: Internal Medicine

## 2018-02-05 VITALS — BP 130/72 | HR 80 | Temp 97.7°F | Ht 65.5 in | Wt 128.4 lb

## 2018-02-05 DIAGNOSIS — A084 Viral intestinal infection, unspecified: Secondary | ICD-10-CM | POA: Diagnosis not present

## 2018-02-05 LAB — CBC
HCT: 39.4 % (ref 35.0–45.0)
Hemoglobin: 13.4 g/dL (ref 11.7–15.5)
MCH: 30 pg (ref 27.0–33.0)
MCHC: 34 g/dL (ref 32.0–36.0)
MCV: 88.3 fL (ref 80.0–100.0)
MPV: 11.1 fL (ref 7.5–12.5)
PLATELETS: 261 10*3/uL (ref 140–400)
RBC: 4.46 10*6/uL (ref 3.80–5.10)
RDW: 12.7 % (ref 11.0–15.0)
WBC: 4.7 10*3/uL (ref 3.8–10.8)

## 2018-02-05 LAB — COMPREHENSIVE METABOLIC PANEL
AG Ratio: 1.7 (calc) (ref 1.0–2.5)
ALBUMIN MSPROF: 4 g/dL (ref 3.6–5.1)
ALKALINE PHOSPHATASE (APISO): 106 U/L (ref 33–130)
ALT: 20 U/L (ref 6–29)
AST: 26 U/L (ref 10–35)
BUN/Creatinine Ratio: 24 (calc) — ABNORMAL HIGH (ref 6–22)
BUN: 24 mg/dL (ref 7–25)
CO2: 31 mmol/L (ref 20–32)
CREATININE: 1.02 mg/dL — AB (ref 0.60–0.88)
Calcium: 9.8 mg/dL (ref 8.6–10.4)
Chloride: 103 mmol/L (ref 98–110)
GLUCOSE: 94 mg/dL (ref 65–139)
Globulin: 2.3 g/dL (calc) (ref 1.9–3.7)
POTASSIUM: 4 mmol/L (ref 3.5–5.3)
SODIUM: 142 mmol/L (ref 135–146)
TOTAL PROTEIN: 6.3 g/dL (ref 6.1–8.1)
Total Bilirubin: 0.4 mg/dL (ref 0.2–1.2)

## 2018-02-05 NOTE — Patient Instructions (Addendum)
CBC and CMET today.  Bland diet. Drink plenty of fluids Advil for pain.  Viral Gastroenteritis, Adult Viral gastroenteritis is also known as the stomach flu. This condition is caused by certain germs (viruses). These germs can be passed from person to person very easily (are very contagious). This condition can cause sudden watery poop (diarrhea), fever, and throwing up (vomiting). Having watery poop and throwing up can make you feel weak and cause you to get dehydrated. Dehydration can make you tired and thirsty, make you have a dry mouth, and make it so you pee (urinate) less often. Older adults and people with other diseases or a weak defense system (immune system) are at higher risk for dehydration. It is important to replace the fluids that you lose from having watery poop and throwing up. Follow these instructions at home: Follow instructions from your doctor about how to care for yourself at home. Eating and drinking  Follow these instructions as told by your doctor:  Take an oral rehydration solution (ORS). This is a drink that is sold at pharmacies and stores.  Drink clear fluids in small amounts as you are able, such as: ? Water. ? Ice chips. ? Diluted fruit juice. ? Low-calorie sports drinks.  Eat bland, easy-to-digest foods in small amounts as you are able, such as: ? Bananas. ? Applesauce. ? Rice. ? Low-fat (lean) meats. ? Toast. ? Crackers.  Avoid fluids that have a lot of sugar or caffeine in them.  Avoid alcohol.  Avoid spicy or fatty foods.  General instructions  Drink enough fluid to keep your pee (urine) clear or pale yellow.  Wash your hands often. If you cannot use soap and water, use hand sanitizer.  Make sure that all people in your home wash their hands well and often.  Rest at home while you get better.  Take over-the-counter and prescription medicines only as told by your doctor.  Watch your condition for any changes.  Take a warm bath to  help with any burning or pain from having watery poop.  Keep all follow-up visits as told by your doctor. This is important. Contact a doctor if:  You cannot keep fluids down.  Your symptoms get worse.  You have new symptoms.  You feel light-headed or dizzy.  You have muscle cramps. Get help right away if:  You have chest pain.  You feel very weak or you pass out (faint).  You see blood in your throw-up.  Your throw-up looks like coffee grounds.  You have bloody or black poop (stools) or poop that look like tar.  You have a very bad headache, a stiff neck, or both.  You have a rash.  You have very bad pain, cramping, or bloating in your belly (abdomen).  You have trouble breathing.  You are breathing very quickly.  Your heart is beating very quickly.  Your skin feels cold and clammy.  You feel confused.  You have pain when you pee.  You have signs of dehydration, such as: ? Dark pee, hardly any pee, or no pee. ? Cracked lips. ? Dry mouth. ? Sunken eyes. ? Sleepiness. ? Weakness. This information is not intended to replace advice given to you by your health care provider. Make sure you discuss any questions you have with your health care provider. Document Released: 05/27/2008 Document Revised: 06/28/2016 Document Reviewed: 08/15/2015 Elsevier Interactive Patient Education  2017 [employer].

## 2018-02-05 NOTE — Progress Notes (Signed)
Subjective:    Patient ID: Kelsey Nelson, female    DOB: 1934-01-21, 84 y.o.   MRN: [id number]  HPI Presents today with c/o upset stomach. Last seen in 2017 with nausea.  She says she has an upset stomach. She had vomiting Sunday and Monday. She has not felt since Sunday. She says she has no appetite. She feels awful.  She has not had a fever. She did have some diarrhea. She says her husband is not sick. She is voiding okay. She is keeping her foods down since Tuesday.  She had a normal BM today. Yesterday her stool was watery. No recent antibiotics.    Review of Systems Past Medical History:  Diagnosis Date  . Blindness of right eye   . BMI between 19-24,adult 2010 145 LBS  . Diverticula of small intestine Dec 2010  . Diverticulosis    [place]  . Functional constipation 2010  . GERD (gastroesophageal reflux disease)   . HTN (hypertension)   . Hyperlipemia   . Right eye trauma 1949   Loss of vision resulted at age 61  . Schatzki's ring Dec 2010    Past Surgical History:  Procedure Laterality Date  . ABDOMINAL HYSTERECTOMY    . BREAST REDUCTION SURGERY  1992 BIL  . COLONOSCOPY  DEC 2010   SIMPLE ADENOMAS (6-8), [place] TICS, SML IH  . COLONOSCOPY N/A 12/08/2013   Procedure: COLONOSCOPY;  Surgeon: Rogene Houston, MD;  [facility];  Service: Endoscopy;  Laterality: N/A;  225  . EVISCERATION Right 05/08/2015   Procedure: EVISCERATION WITH IMPLANT RIGHT EYE;  Surgeon: Williams Che, MD;  Location: [facility];  Service: Ophthalmology;  Laterality: Right;  . EYE SURGERY  RIGHT 2005  . EYE SURGERY  right 2016   removed and false eye placed in 08/2015  . TOTAL ABDOMINAL HYSTERECTOMY W/ BILATERAL SALPINGOOPHORECTOMY  1985 FIBROIDS  . UPPER GASTROINTESTINAL ENDOSCOPY  DEC 2010   Bx-MILD GASTRITIS/DUODENITIS, DUODENAL DIVERTICULA, SCHATZKI'S RING    Allergies  Allergen Reactions  . Codeine   . Fosamax [Alendronate Sodium]     heartburn    Current Outpatient Medications on  File Prior to Visit  Medication Sig Dispense Refill  . aspirin 81 MG tablet Take 81 mg by mouth daily.     . fluticasone (FLONASE) 50 MCG/ACT nasal spray Place 1 spray daily into both nostrils. 16 g 2  . Multiple Vitamin (MULTI-VITAMIN DAILY PO) Take by mouth daily. doTerra Women -  Bone Nutireint    . omeprazole (PRILOSEC) 20 MG capsule TAKE 1 CAPSULE BY MOUTH DAILY AS NEEDED 90 capsule 1  . pravastatin (PRAVACHOL) 20 MG tablet TAKE 1 TABLET(20 MG) BY MOUTH EVERY EVENING 90 tablet 0  . ramipril (ALTACE) 5 MG capsule TAKE 1 CAPSULE(5 MG) BY MOUTH DAILY 90 capsule 1  . OVER THE COUNTER MEDICATION As needed     No current facility-administered medications on file prior to visit.         Objective:   Physical Exam Blood pressure 130/72, pulse 80, temperature 97.7 F (36.5 C), height 5' 5.5" (1.664 m), weight 128 lb 6.4 oz (58.2 kg). Alert and oriented. Skin warm and dry. Oral mucosa is moist.   . Sclera anicteric, conjunctivae is pink. Thyroid not enlarged. No cervical lymphadenopathy. Lungs clear. Heart regular rate and rhythm.  Abdomen is soft. Bowel sounds are positive. No hepatomegaly. No abdominal masses felt. No tenderness.  No edema to lower extremities.  Assessment & Plan:  Probably gastroenteritis. Bland diet.  CBC and CMET.  Plenty of fluids.  Hand out on viral gastroenteritis given to patient.

## 2018-02-21 ENCOUNTER — Other Ambulatory Visit: Payer: Self-pay | Admitting: Family Medicine

## 2018-04-02 ENCOUNTER — Telehealth: Payer: Self-pay | Admitting: Family Medicine

## 2018-04-02 NOTE — Telephone Encounter (Signed)
Left voicemail for patient to call back & r/s appt, per r/s list

## 2018-04-22 DIAGNOSIS — H2512 Age-related nuclear cataract, left eye: Secondary | ICD-10-CM | POA: Diagnosis not present

## 2018-04-22 DIAGNOSIS — H10021 Other mucopurulent conjunctivitis, right eye: Secondary | ICD-10-CM | POA: Diagnosis not present

## 2018-05-06 ENCOUNTER — Ambulatory Visit: Payer: PPO | Admitting: Family Medicine

## 2018-05-07 ENCOUNTER — Telehealth: Payer: Self-pay | Admitting: Family Medicine

## 2018-05-07 NOTE — Telephone Encounter (Signed)
Patient lvm asking why she has an appt and if she needs labs. I called her back to let her know it was a 4month f/u & that she does have active labs.  I printed them for her to pickup. (in brown folder)

## 2018-05-11 DIAGNOSIS — E785 Hyperlipidemia, unspecified: Secondary | ICD-10-CM | POA: Diagnosis not present

## 2018-05-11 DIAGNOSIS — I10 Essential (primary) hypertension: Secondary | ICD-10-CM | POA: Diagnosis not present

## 2018-05-11 LAB — COMPREHENSIVE METABOLIC PANEL
AG Ratio: 1.5 (calc) (ref 1.0–2.5)
ALBUMIN MSPROF: 4 g/dL (ref 3.6–5.1)
ALKALINE PHOSPHATASE (APISO): 117 U/L (ref 33–130)
ALT: 21 U/L (ref 6–29)
AST: 25 U/L (ref 10–35)
BUN: 20 mg/dL (ref 7–25)
CHLORIDE: 106 mmol/L (ref 98–110)
CO2: 30 mmol/L (ref 20–32)
Calcium: 9.6 mg/dL (ref 8.6–10.4)
Creat: 0.85 mg/dL (ref 0.60–0.88)
GLOBULIN: 2.6 g/dL (ref 1.9–3.7)
Glucose, Bld: 95 mg/dL (ref 65–99)
POTASSIUM: 4.3 mmol/L (ref 3.5–5.3)
Sodium: 140 mmol/L (ref 135–146)
Total Bilirubin: 0.4 mg/dL (ref 0.2–1.2)
Total Protein: 6.6 g/dL (ref 6.1–8.1)

## 2018-05-11 LAB — LIPID PANEL
Cholesterol: 221 mg/dL — ABNORMAL HIGH (ref <200)
HDL: 84 mg/dL (ref >50)
LDL CHOLESTEROL (CALC): 114 mg/dL — AB
Non-HDL Cholesterol (Calc): 137 mg/dL (calc) — ABNORMAL HIGH (ref <130)
TRIGLYCERIDES: 123 mg/dL (ref <150)
Total CHOL/HDL Ratio: 2.6 (calc) (ref <5.0)

## 2018-05-12 ENCOUNTER — Ambulatory Visit (INDEPENDENT_AMBULATORY_CARE_PROVIDER_SITE_OTHER): Payer: PPO | Admitting: Family Medicine

## 2018-05-12 ENCOUNTER — Encounter: Payer: Self-pay | Admitting: Family Medicine

## 2018-05-12 VITALS — BP 140/82 | HR 64 | Resp 16 | Ht 65.5 in | Wt 130.0 lb

## 2018-05-12 DIAGNOSIS — I10 Essential (primary) hypertension: Secondary | ICD-10-CM

## 2018-05-12 DIAGNOSIS — Z1231 Encounter for screening mammogram for malignant neoplasm of breast: Secondary | ICD-10-CM | POA: Diagnosis not present

## 2018-05-12 DIAGNOSIS — E785 Hyperlipidemia, unspecified: Secondary | ICD-10-CM

## 2018-05-12 NOTE — Patient Instructions (Addendum)
Annual physical exam Nov 16 or after, call if you need me before  Please schedule   mammogram at checkout , due July 17 or after.  Congrats on great health habits, jUST  Less fried and fatty foods  Keep walking!  Thank you  for choosing [facility]. We consider it a privelige to serve you.  Delivering excellent health care in a caring and  compassionate way is our goal.  Partnering with you,  so that together we can achieve this goal is our strategy.

## 2018-05-18 NOTE — Progress Notes (Signed)
   Kelsey Nelson     MRN: [id number]      DOB: February 17, 1934   HPI Kelsey Nelson is here for follow up and re-evaluation of chronic medical conditions, medication management and review of any available recent lab and radiology data.  Preventive health is updated, specifically  Cancer screening and Immunization.   The PT denies any adverse reactions to current medications since the last visit.  There are no new concerns.  There are no specific complaints  Walks 3 miles 5  Days per week and enjoys this  ROS Denies recent fever or chills. Denies sinus pressure, nasal congestion, ear pain or sore throat. Denies chest congestion, productive cough or wheezing. Denies chest pains, palpitations and leg swelling Denies abdominal pain, nausea, vomiting,diarrhea or constipation.   Denies dysuria, frequency, hesitancy or incontinence. Denies joint pain, swelling and limitation in mobility. Denies headaches, seizures, numbness, or tingling. Denies depression, anxiety or insomnia. Denies skin break down or rash.   PE  BP 140/82   Pulse 64   Resp 16   Ht 5' 5.5" (1.664 m)   Wt 130 lb (59 kg)   SpO2 97%   BMI 21.30 kg/m   Patient alert and oriented and in no cardiopulmonary distress.  HEENT: No facial asymmetry, EOMI,   oropharynx pink and moist.  Neck supple no JVD, no mass.  Chest: Clear to auscultation bilaterally.  CVS: S1, S2 no murmurs, no S3.Regular rate.  ABD: Soft non tender.   Ext: No edema  MS: Adequate ROM spine, shoulders, hips and knees.  Skin: Intact, no ulcerations or rash noted.  Psych: Good eye contact, normal affect. Memory intact not anxious or depressed appearing.  CNS: CN 2-12 intact, power,  normal throughout.no focal deficits noted.   Assessment & Plan  Essential hypertension Elevated at visit today, no mmed change DASH diet and commitment to daily physical activity for a minimum of 30 minutes discussed and encouraged, as a part of hypertension  management. The importance of attaining a healthy weight is also discussed.  BP/Weight 05/12/2018 02/05/2018 01/05/2018 11/06/2017 06/30/2017 01/13/2017 1/32/4401  Systolic BP 027 253 664 403 474 259 563  Diastolic BP 82 72 68 84 78 74 76  Wt. (Lbs) 130 128.4 129 129 126.12 125 125  BMI 21.3 21.04 21.14 21.47 20.99 20.8 20.8       Hyperlipidemia LDL goal <100 Hyperlipidemia:Low fat diet discussed and encouraged.   Lipid Panel  Lab Results  Component Value Date   CHOL 221 (H) 05/11/2018   HDL 84 05/11/2018   LDLCALC 114 (H) 05/11/2018   TRIG 123 05/11/2018   CHOLHDL 2.6 05/11/2018   Improved , still needs to reduce fatty food intake

## 2018-05-18 NOTE — Assessment & Plan Note (Signed)
Elevated at visit today, no mmed change DASH diet and commitment to daily physical activity for a minimum of 30 minutes discussed and encouraged, as a part of hypertension management. The importance of attaining a healthy weight is also discussed.  BP/Weight 05/12/2018 02/05/2018 01/05/2018 11/06/2017 06/30/2017 01/13/2017 9/87/2158  Systolic BP 727 618 485 927 [phone]  Diastolic BP 82 72 68 84 78 74 76  Wt. (Lbs) 130 128.4 129 129 126.12 125 125  BMI 21.3 21.04 21.14 21.47 20.99 20.8 20.8

## 2018-05-18 NOTE — Assessment & Plan Note (Signed)
Hyperlipidemia:Low fat diet discussed and encouraged.   Lipid Panel  Lab Results  Component Value Date   CHOL 221 (H) 05/11/2018   HDL 84 05/11/2018   LDLCALC 114 (H) 05/11/2018   TRIG 123 05/11/2018   CHOLHDL 2.6 05/11/2018   Improved , still needs to reduce fatty food intake

## 2018-05-22 ENCOUNTER — Other Ambulatory Visit: Payer: Self-pay | Admitting: Family Medicine

## 2018-08-28 DIAGNOSIS — Z4421 Encounter for fitting and adjustment of artificial right eye: Secondary | ICD-10-CM | POA: Diagnosis not present

## 2018-09-07 ENCOUNTER — Telehealth: Payer: Self-pay | Admitting: Family Medicine

## 2018-09-07 NOTE — Telephone Encounter (Signed)
PT LVM that she wanted to be seen today, I called the PT back left her a VM to call the office

## 2018-09-08 ENCOUNTER — Encounter: Payer: Self-pay | Admitting: Family Medicine

## 2018-09-08 ENCOUNTER — Telehealth: Payer: Self-pay

## 2018-09-08 ENCOUNTER — Other Ambulatory Visit: Payer: Self-pay

## 2018-09-08 ENCOUNTER — Ambulatory Visit (INDEPENDENT_AMBULATORY_CARE_PROVIDER_SITE_OTHER): Payer: PPO | Admitting: Family Medicine

## 2018-09-08 ENCOUNTER — Ambulatory Visit (HOSPITAL_COMMUNITY)
Admission: RE | Admit: 2018-09-08 | Discharge: 2018-09-08 | Disposition: A | Discharge status: Home / Self Care | Payer: PPO | Source: Ambulatory Visit | Attending: Family Medicine | Admitting: Family Medicine

## 2018-09-08 VITALS — BP 148/78 | HR 99 | Temp 98.7°F | Resp 12 | Ht 65.5 in | Wt 130.0 lb

## 2018-09-08 DIAGNOSIS — R0789 Other chest pain: Secondary | ICD-10-CM | POA: Insufficient documentation

## 2018-09-08 NOTE — Telephone Encounter (Signed)
Spoke with [employer]. Patient hasn't been seen in over 3 years so she is considered a new patient. Scheduled her for October 22 at 1:20pm in the [place] office. Left message for patient with appt info

## 2018-09-08 NOTE — Progress Notes (Signed)
Patient ID: Kelsey Nelson, female    DOB: 02-Aug-1934, 84 y.o.   MRN: [id number]  Chief Complaint  Patient presents with  . Chest Pain  . Gas    Allergies Codeine and Fosamax [alendronate sodium]  Subjective:   Kelsey Nelson is a 84 y.o. female who presents to [facility] today.  HPI Kelsey Nelson presents today for an acute care visit.  She is usually seen by Dr. Tula Nakayama.  She reports that 2 nights ago that she took two over-the-counter sleep aid medications, diphenhydramine 25 mg.  She reports that she toss and turned throughout the night and was only able to sleep for approximately 3 hours.  She reports that the next morning when she woke up she noticed she had a heavy feeling in chest and guarded burping.  She reports that the pain lasted for several hours and then it finally went away.  She describes the sensation as a feeling that there is something heavy in her chest.  She did not have any associated nausea, vomiting, or sweating.  She reports that she did have some shortness of breath with the chest pressure.  She reports that the pressure did radiate down her left arm.  She reports it was not a pain but it was a pressure sensation.  She she believes that belching made the pressure feel better.  She usually walks 3 miles every morning but reports did not walk yesterday b/c of the heaviness. She usually does not get SOB or CP/pressure with walking. She did not do anything to make it go away, just went away on its own. Does report that she has had the pain in the past and went to the hosptial for it several years ago.  Was subsequently seen by Dr. Rozann Lesches and had an event monitor placed.  She did not have a stress test performed at that time.  She has not had any subsequent episodes since that time.  Her troponin and laboratory testing done several years ago at the time of the incident were negative. Had a stress test 20 years ago. No tobacco use. No DM. No  prior AMI or CVA. Takes HTN medications.  Reports that she just took her medicine this morning.  She reports that she woke up on her own accord and was not woken up by the pain.  She does report that she has a history of reflux and is followed by Dr.Rehman.  She uses omeprazole every other day.  She reports that she does frequently belch throughout the day.  She does believe this pain is different than her usual/occasional heartburn.   Past Medical History:  Diagnosis Date  . Blindness of right eye   . BMI between 19-24,adult 2010 145 LBS  . Diverticula of small intestine Dec 2010  . Diverticulosis    [place]  . Functional constipation 2010  . GERD (gastroesophageal reflux disease)   . HTN (hypertension)   . Hyperlipemia   . Right eye trauma 1949   Loss of vision resulted at age 9  . Schatzki's ring Dec 2010    Past Surgical History:  Procedure Laterality Date  . ABDOMINAL HYSTERECTOMY    . BREAST REDUCTION SURGERY  1992 BIL  . COLONOSCOPY  DEC 2010   SIMPLE ADENOMAS (6-8), [place] TICS, SML IH  . COLONOSCOPY N/A 12/08/2013   Procedure: COLONOSCOPY;  Surgeon: Rogene Houston, MD;  Location[facility];  Service: Endoscopy;  Laterality: N/A;  225  . EVISCERATION Right 05/08/2015   Procedure: EVISCERATION WITH IMPLANT RIGHT EYE;  Surgeon: Williams Che, MD;  Location: [facility] ORS;  Service: Ophthalmology;  Laterality: Right;  . EYE SURGERY  RIGHT 2005  . EYE SURGERY  right 2016   removed and false eye placed in 08/2015  . TOTAL ABDOMINAL HYSTERECTOMY W/ BILATERAL SALPINGOOPHORECTOMY  1985 FIBROIDS  . UPPER GASTROINTESTINAL ENDOSCOPY  DEC 2010   Bx-MILD GASTRITIS/DUODENITIS, DUODENAL DIVERTICULA, SCHATZKI'S RING    Family History  Problem Relation Age of Onset  . Pancreatic cancer Sister   . Colon cancer Paternal Aunt   . Colon polyps Neg Hx      Social History   Socioeconomic History  . Marital status: Married    Spouse name: Not on file  . Number of children: Not on file  .  Years of education: Not on file  . Highest education level: Not on file  Occupational History  . Not on file  Social Needs  . Financial resource strain: Not hard at all  . Food insecurity:    Worry: Never true    Inability: Never true  . Transportation needs:    Medical: No    Non-medical: No  Tobacco Use  . Smoking status: Never Smoker  . Smokeless tobacco: Never Used  Substance and Sexual Activity  . Alcohol use: No    Alcohol/week: 0.0 standard drinks  . Drug use: No  . Sexual activity: Never  Lifestyle  . Physical activity:    Days per week: 5 days    Minutes per session: 60 min  . Stress: Not at all  Relationships  . Social connections:    Talks on phone: Twice a week    Gets together: Once a week    Attends religious service: More than 4 times per year    Active member of club or organization: Yes    Attends meetings of clubs or organizations: More than 4 times per year    Relationship status: Married  Other Topics Concern  . 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 aspirin 81 MG tablet Take 81 mg by mouth daily.     . Multiple Vitamin (MULTI-VITAMIN DAILY PO) Take by mouth daily. doTerra Women -  Bone Nutireint    . omeprazole (PRILOSEC) 20 MG capsule TAKE 1 CAPSULE BY MOUTH DAILY AS NEEDED 90 capsule 1  . OVER THE COUNTER MEDICATION As needed    . pravastatin (PRAVACHOL) 20 MG tablet TAKE 1 TABLET(20 MG) BY MOUTH EVERY EVENING 90 tablet 1  . ramipril (ALTACE) 5 MG capsule TAKE 1 CAPSULE(5 MG) BY MOUTH DAILY 90 capsule 1   No current facility-administered medications on file prior to visit.     Review of Systems   Objective:   BP (!) 148/78 (BP Location: Right Arm, Patient Position: Sitting, Cuff Size: Normal)   Pulse 99   Temp 98.7 F (37.1 C) (Temporal)   Resp 12   Ht 5' 5.5" (1.664 m)   Wt 130 lb 0.6 oz (59  kg)   SpO2 97% Comment: room air  BMI 21.31 kg/m   Physical Exam  Constitutional: She is oriented to person, place, and time. She appears well-developed and well-nourished.  Non-toxic appearance. She does not appear ill. No distress.  HENT:  Head: Normocephalic and atraumatic.  Eyes: Pupils are  equal, round, and reactive to light.  Neck: Normal range of motion. Neck supple. No hepatojugular reflux and no JVD present. No tracheal deviation present. No thyromegaly present.  Cardiovascular: Normal rate, regular rhythm, normal heart sounds and normal pulses.  No murmur heard. Pulmonary/Chest: Effort normal and breath sounds normal. No accessory muscle usage or stridor. No respiratory distress. She has no decreased breath sounds.  No chest wall tenderness to palpation.  Upon palpating her chest wall she does belch.  Abdominal: Soft. Bowel sounds are normal. She exhibits no distension. There is no tenderness.  Musculoskeletal: Normal range of motion.  Lymphadenopathy:    She has no cervical adenopathy.  Neurological: She is alert and oriented to person, place, and time. She is not disoriented. No cranial nerve deficit.  Skin: Skin is warm and dry. No rash noted. No erythema.  Psychiatric: She has a normal mood and affect. Her behavior is normal. Her mood appears not anxious.  Nursing note and vitals reviewed.  EKG performed in the office which revealed normal sinus rhythm with a nonspecific ST abnormality.  No changes from her previous EKG. Assessment and Plan  1. Chest pressure Chest pressure of uncertain etiology.  She does have cardiovascular risk factors and also has a history of reflux. She is previously been seen by cardiology with very similar symptoms and it was not thought to be cardiovascular in origin.  However, I suspect at this time she does need a stress test for further evaluation. Will obtain chest x-ray at this time. Will increase PPI use to daily. Recommended she follow-up  with her gastroenterologist. Urgent referral placed to cardiology for evaluation and stress test.  We called to get the patient in but she could not wait for appointment.  We will plan to call her on an outpatient basis. In addition, patient was counseled if she develops any recurrent chest pain she should call 911 and go to the emergency department for evaluation.  She voiced understanding.  Patient was counseled concerning worrisome signs and symptoms of chest pain and pressure. - Ambulatory referral to Cardiology - DG Chest 2 View; Future She was asked to follow-up with her PCP within the next month for evaluation and recheck of her blood pressure. No follow-ups on file. Caren Macadam, MD 09/08/2018

## 2018-09-10 ENCOUNTER — Encounter: Payer: Self-pay | Admitting: *Deleted

## 2018-09-10 ENCOUNTER — Encounter: Payer: Self-pay | Admitting: Cardiology

## 2018-09-10 ENCOUNTER — Ambulatory Visit: Payer: PPO | Admitting: Cardiology

## 2018-09-10 VITALS — BP 122/70 | HR 75 | Ht 65.5 in | Wt 128.8 lb

## 2018-09-10 DIAGNOSIS — I10 Essential (primary) hypertension: Secondary | ICD-10-CM | POA: Diagnosis not present

## 2018-09-10 DIAGNOSIS — R0789 Other chest pain: Secondary | ICD-10-CM

## 2018-09-10 DIAGNOSIS — I700 Atherosclerosis of aorta: Secondary | ICD-10-CM | POA: Diagnosis not present

## 2018-09-10 NOTE — Progress Notes (Signed)
Cardiology Office Note:    Date:  09/10/2018   ID:  Kelsey Nelson, DOB 09-03-1934, MRN [id number]  PCP:  Fayrene Helper, MD  Cardiologist:  No primary care provider on file.  Electrophysiologist:  None   Referring MD: Caren Macadam, MD     History of Present Illness:    Kelsey Nelson is a 84 y.o. female here for evaluation of chest pain at the request of Dr. Mannie Stabile.  She was seen over 3 years ago by Dr. Domenic Polite describing a 1 year history of recurrent chest discomfort described as a burning in her left retrosternal area usually early in the morning at about 530 followed by a feeling of heat all over her body than palpitations.  Last in our at a time.  May be improved by eating, drinking.  Nonexertional.  She used to be able to walk up to 3 miles a few times a week does not have any symptoms with this activity.  She had previously documented Schatzki's ring and EGD back in 2010 as well as GERD.  During that encounter in 2016, it was felt to sound more gastric intestinal in etiology, normal troponin, normal EKG and chest x-ray.  No arrhythmias were detected.  Consideration of EGD was discussed.  On 09/10/2018 she described chest pressure in the morning before and is soon as after she gets up.  It has been quite bad over the past 3 days and sometimes travels down her left arm and feels more like a pressure-like sensation.  She thinks it is gas at times. Walks 3 miles over an hour with a friend every morning. Has not had any symptoms today.  No nausea vomiting syncope bleeding orthopnea.  Past Medical History:  Diagnosis Date  . Blindness of right eye   . BMI between 19-24,adult 2010 145 LBS  . Diverticula of small intestine Dec 2010  . Diverticulosis    [place]  . Functional constipation 2010  . GERD (gastroesophageal reflux disease)   . HTN (hypertension)   . Hyperlipemia   . Right eye trauma 1949   Loss of vision resulted at age 65  . Schatzki's ring Dec 2010    Past Surgical  History:  Procedure Laterality Date  . ABDOMINAL HYSTERECTOMY    . BREAST REDUCTION SURGERY  1992 BIL  . COLONOSCOPY  DEC 2010   SIMPLE ADENOMAS (6-8), [place] TICS, SML IH  . COLONOSCOPY N/A 12/08/2013   Procedure: COLONOSCOPY;  Surgeon: Rogene Houston, MD;  Location[facility];  Service: Endoscopy;  Laterality: N/A;  225  . EVISCERATION Right 05/08/2015   Procedure: EVISCERATION WITH IMPLANT RIGHT EYE;  Surgeon: Williams Che, MD;  Location: [facility];  Service: Ophthalmology;  Laterality: Right;  . EYE SURGERY  RIGHT 2005  . EYE SURGERY  right 2016   removed and false eye placed in 08/2015  . TOTAL ABDOMINAL HYSTERECTOMY W/ BILATERAL SALPINGOOPHORECTOMY  1985 FIBROIDS  . UPPER GASTROINTESTINAL ENDOSCOPY  DEC 2010   Bx-MILD GASTRITIS/DUODENITIS, DUODENAL DIVERTICULA, SCHATZKI'S RING    Current Medications: Current Meds  Medication Sig  . aspirin 81 MG tablet Take 81 mg by mouth daily.   . Multiple Vitamin (MULTI-VITAMIN DAILY PO) Take by mouth daily. doTerra Women -  Bone Nutireint  . omeprazole (PRILOSEC) 20 MG capsule TAKE 1 CAPSULE BY MOUTH DAILY AS NEEDED  . pravastatin (PRAVACHOL) 20 MG tablet TAKE 1 TABLET(20 MG) BY MOUTH EVERY EVENING  . ramipril (ALTACE) 5 MG capsule TAKE 1 CAPSULE(5  MG) BY MOUTH DAILY     Allergies:   Codeine and Fosamax [alendronate sodium]   Social History   Socioeconomic History  . Marital status: Married    Spouse name: Not on file  . Number of children: Not on file  . Years of education: Not on file  . Highest education level: Not on file  Occupational History  . Not on file  Social Needs  . Financial resource strain: Not hard at all  . Food insecurity:    Worry: Never true    Inability: Never true  . Transportation needs:    Medical: No    Non-medical: No  Tobacco Use  . Smoking status: Never Smoker  . Smokeless tobacco: Never Used  Substance and Sexual Activity  . Alcohol use: No    Alcohol/week: 0.0 standard drinks  . Drug use:  No  . Sexual activity: Never  Lifestyle  . Physical activity:    Days per week: 5 days    Minutes per session: 60 min  . Stress: Not at all  Relationships  . Social connections:    Talks on phone: Twice a week    Gets together: Once a week    Attends religious service: More than 4 times per year    Active member of club or organization: Yes    Attends meetings of clubs or organizations: More than 4 times per year    Relationship status: Married  Other Topics Concern  . 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 There is no history of Colon polyps.  ROS:   Please see the history of present illness.     All other systems reviewed and are negative.  EKGs/Labs/Other Studies Reviewed:    The following studies were reviewed today: Prior office notes EKG lab work reviewed  EKG: 09/08/2018- sinus rhythm with nonspecific ST-T wave changes personally reviewed and interpreted.  Recent Labs: 02/05/2018: Hemoglobin 13.4; Platelets 261 05/11/2018: ALT 21; BUN 20; Creat 0.85; Potassium 4.3; Sodium 140  Recent Lipid Panel    Component Value Date/Time   CHOL 221 (H) 05/11/2018 0807   TRIG 123 05/11/2018 0807   HDL 84 05/11/2018 0807   CHOLHDL 2.6 05/11/2018 0807   VLDL 26 06/26/2017 0732   LDLCALC 114 (H) 05/11/2018 0807    Physical Exam:    VS:  BP 122/70   Pulse 75   Ht 5' 5.5" (1.664 m)   Wt 128 lb 12.8 oz (58.4 kg)   SpO2 96%   BMI 21.11 kg/m     Wt Readings from Last 3 Encounters:  09/10/18 128 lb 12.8 oz (58.4 kg)  09/08/18 130 lb 0.6 oz (59 kg)  05/12/18 130 lb (59 kg)     GEN:  Well nourished, well developed in no acute distress HEENT: Normal NECK: No JVD; No carotid bruits LYMPHATICS: No lymphadenopathy CARDIAC: RRR, no murmurs, rubs, gallops RESPIRATORY:  Clear to auscultation without  rales, wheezing or rhonchi  ABDOMEN: Soft, non-tender, non-distended MUSCULOSKELETAL:  No edema; No deformity  SKIN: Warm and dry NEUROLOGIC:  Alert and oriented x 3 PSYCHIATRIC:  Normal affect   ASSESSMENT:    1. Chest pressure   2. Essential hypertension   3. Aortic atherosclerosis (HCC)    PLAN:    In order of problems listed above:  Chest  pain - Nuclear stress test, prefer Lexiscan given her advanced age, however she does walk quite well and if you would like to do a walking study this is fine as well.  Symptoms occur mostly in the morning, left arm radiation of discomfort.  She is not having any discomfort mostly throughout the day.  This could all be GI related.  This was thought to be the etiology previously about 3 years ago during her last evaluation.  Essential hypertension -Continue with ACE.  Excellent control.  Hyperlipidemia -Continue with Pravachol 20 mg.  Aortic atherosclerosis -Plaque seen on CT scan in 2014 of her abdomen pelvis.  Continue with statin, hypertension control.  Medication Adjustments/Labs and Tests Ordered: Current medicines are reviewed at length with the patient today.  Concerns regarding medicines are outlined above.  Orders Placed This Encounter  Procedures  . MYOCARDIAL PERFUSION IMAGING   No orders of the defined types were placed in this encounter.   Patient Instructions  Medication Instructions:  The current medical regimen is effective;  continue present plan and medications.  Testing/Procedures: Your physician has requested that you have a lexiscan myoview. For further information please visit [email or web address]. Please follow instruction sheet, as given.  Follow-Up: Follow up as needed with the above testing.  If you need a refill on your cardiac medications before your next appointment, please call your pharmacy.  Thank you for choosing [facility]!!        Signed, Candee Furbish, MD  09/10/2018 3:40 PM      [place]

## 2018-09-10 NOTE — Patient Instructions (Signed)
Medication Instructions:  The current medical regimen is effective;  continue present plan and medications.  Testing/Procedures: Your physician has requested that you have a lexiscan myoview. For further information please visit [email or web address]. Please follow instruction sheet, as given.  Follow-Up: Follow up as needed with the above testing.  If you need a refill on your cardiac medications before your next appointment, please call your pharmacy.  Thank you for choosing [facility]!!

## 2018-09-14 ENCOUNTER — Telehealth (HOSPITAL_COMMUNITY): Payer: Self-pay | Admitting: *Deleted

## 2018-09-14 NOTE — Telephone Encounter (Signed)
Left message on voicemail in reference to upcoming appointment scheduled for 09/18/18. Phone number given for a call back so details instructions can be given. Kelsey Nelson, Ranae Palms

## 2018-09-17 NOTE — Telephone Encounter (Signed)
error 

## 2018-09-18 ENCOUNTER — Ambulatory Visit (HOSPITAL_COMMUNITY): Payer: PPO | Attending: Cardiovascular Disease

## 2018-09-18 DIAGNOSIS — I10 Essential (primary) hypertension: Secondary | ICD-10-CM | POA: Diagnosis not present

## 2018-09-18 DIAGNOSIS — R0789 Other chest pain: Secondary | ICD-10-CM | POA: Diagnosis not present

## 2018-09-18 DIAGNOSIS — I700 Atherosclerosis of aorta: Secondary | ICD-10-CM | POA: Insufficient documentation

## 2018-09-18 LAB — MYOCARDIAL PERFUSION IMAGING
CHL CUP NUCLEAR SSS: 1
CHL CUP RESTING HR STRESS: 62 {beats}/min
LVDIAVOL: 49 mL (ref 46–106)
LVSYSVOL: 11 mL
Peak HR: 95 {beats}/min
SDS: 1
SRS: 0
TID: 0.88

## 2018-09-18 MED ORDER — REGADENOSON 0.4 MG/5ML IV SOLN
0.4000 mg | Freq: Once | INTRAVENOUS | Status: AC
  Administered 2018-09-18: 0.4 mg via INTRAVENOUS

## 2018-09-18 MED ORDER — TECHNETIUM TC 99M TETROFOSMIN IV KIT
10.9000 | PACK | Freq: Once | INTRAVENOUS | Status: AC | PRN
  Administered 2018-09-18: 10.9 via INTRAVENOUS
  Filled 2018-09-18: qty 11

## 2018-09-18 MED ORDER — TECHNETIUM TC 99M TETROFOSMIN IV KIT
31.6000 | PACK | Freq: Once | INTRAVENOUS | Status: AC | PRN
  Administered 2018-09-18: 31.6 via INTRAVENOUS
  Filled 2018-09-18: qty 32

## 2018-09-28 ENCOUNTER — Ambulatory Visit (INDEPENDENT_AMBULATORY_CARE_PROVIDER_SITE_OTHER): Payer: PPO

## 2018-09-28 DIAGNOSIS — Z23 Encounter for immunization: Secondary | ICD-10-CM

## 2018-10-13 ENCOUNTER — Ambulatory Visit: Payer: PPO | Admitting: Cardiology

## 2018-11-04 ENCOUNTER — Encounter: Payer: Self-pay | Admitting: Family Medicine

## 2018-11-04 ENCOUNTER — Ambulatory Visit (INDEPENDENT_AMBULATORY_CARE_PROVIDER_SITE_OTHER): Payer: PPO | Admitting: Family Medicine

## 2018-11-04 VITALS — BP 118/74 | HR 67 | Resp 15 | Ht 66.0 in | Wt 133.0 lb

## 2018-11-04 DIAGNOSIS — K219 Gastro-esophageal reflux disease without esophagitis: Secondary | ICD-10-CM

## 2018-11-04 DIAGNOSIS — I10 Essential (primary) hypertension: Secondary | ICD-10-CM

## 2018-11-04 DIAGNOSIS — E785 Hyperlipidemia, unspecified: Secondary | ICD-10-CM | POA: Diagnosis not present

## 2018-11-04 LAB — COMPLETE METABOLIC PANEL WITH GFR
AG Ratio: 1.7 (calc) (ref 1.0–2.5)
ALT: 16 U/L (ref 6–29)
AST: 24 U/L (ref 10–35)
Albumin: 4.2 g/dL (ref 3.6–5.1)
Alkaline phosphatase (APISO): 119 U/L (ref 33–130)
BUN: 17 mg/dL (ref 7–25)
CALCIUM: 9.5 mg/dL (ref 8.6–10.4)
CO2: 32 mmol/L (ref 20–32)
Chloride: 104 mmol/L (ref 98–110)
Creat: 0.86 mg/dL (ref 0.60–0.88)
GFR, EST AFRICAN AMERICAN: 73 mL/min/{1.73_m2} (ref >=60)
GFR, EST NON AFRICAN AMERICAN: 63 mL/min/{1.73_m2} (ref >=60)
GLUCOSE: 96 mg/dL (ref 65–99)
Globulin: 2.5 g/dL (calc) (ref 1.9–3.7)
Potassium: 4.4 mmol/L (ref 3.5–5.3)
Sodium: 141 mmol/L (ref 135–146)
TOTAL PROTEIN: 6.7 g/dL (ref 6.1–8.1)
Total Bilirubin: 0.5 mg/dL (ref 0.2–1.2)

## 2018-11-04 LAB — LIPID PANEL
CHOL/HDL RATIO: 2.3 (calc) (ref <5.0)
Cholesterol: 205 mg/dL — ABNORMAL HIGH (ref <200)
HDL: 90 mg/dL (ref >50)
LDL Cholesterol (Calc): 97 mg/dL (calc)
NON-HDL CHOLESTEROL (CALC): 115 mg/dL (ref <130)
Triglycerides: 87 mg/dL (ref <150)

## 2018-11-04 LAB — MAGNESIUM: Magnesium: 2 mg/dL (ref 1.5–2.5)

## 2018-11-04 NOTE — Assessment & Plan Note (Signed)
Mildly symptomatic on an intermittent basis , uses low dose PPI as needed

## 2018-11-04 NOTE — Patient Instructions (Addendum)
Please schedule Annual Physical exam  In April. Call if you need me before  Keep wellness appointment    Fasting lipid, cmp and  EGFr  And magnesium today  It is important that you exercise regularly at least 30 minutes 5 times a week. If you develop chest pain, have severe difficulty breathing, or feel very tired, stop exercising immediately and seek medical attention    Thank you  for choosing [facility]. We consider it a privelige to serve you.  Delivering excellent health care in a caring and  compassionate way is our goal.  Partnering with you,  so that together we can achieve this goal is our strategy.

## 2018-11-04 NOTE — Assessment & Plan Note (Signed)
Hyperlipidemia:Low fat diet discussed and encouraged.   Lipid Panel  Lab Results  Component Value Date   CHOL 221 (H) 05/11/2018   HDL 84 05/11/2018   LDLCALC 114 (H) 05/11/2018   TRIG 123 05/11/2018   CHOLHDL 2.6 05/11/2018   Uncontrolled , f/u lab currently due

## 2018-11-04 NOTE — Assessment & Plan Note (Signed)
Controlled, no change in medication DASH diet and commitment to daily physical activity for a minimum of 30 minutes discussed and encouraged, as a part of hypertension management. The importance of attaining a healthy weight is also discussed.  BP/Weight 11/04/2018 09/10/2018 09/08/2018 05/12/2018 02/05/2018 01/05/2018 06/17/9484  Systolic BP 462 703 500 938 182 993 716  Diastolic BP 74 70 78 82 72 68 84  Wt. (Lbs) 133 128.8 130.04 130 128.4 129 129  BMI 21.47 21.11 21.31 21.3 21.04 21.14 21.47

## 2018-11-04 NOTE — Progress Notes (Signed)
   Kelsey Nelson     MRN: [id number]      DOB: October 12, 1934   HPI Ms. Dasher is here for follow up and re-evaluation of chronic medical conditions, medication management and review of any available recent lab and radiology data.  Preventive health is updated, specifically  Cancer screening and Immunization.   Wants to review the report of the stress test which she had several weeks ago. She denies any recurrent left substernal chest pain, and walks for 1 hour daily, report is normal and she is reassured C/o new left sided facial twitch   ROS Denies recent fever or chills. Denies sinus pressure, nasal congestion, ear pain or sore throat. Denies chest congestion, productive cough or wheezing. Denies chest pains, palpitations and leg swelling Denies abdominal pain, nausea, vomiting,diarrhea or constipation.   Denies dysuria, frequency, hesitancy or incontinence. Denies joint pain, swelling and limitation in mobility. Denies headaches, seizures, numbness, or tingling. Denies depression, anxiety or insomnia. Denies skin break down or rash.   PE  BP 118/74   Pulse 67   Resp 15   Ht 5\' 6"  (1.676 m)   Wt 133 lb (60.3 kg)   SpO2 98%   BMI 21.47 kg/m   Patient alert and oriented and in no cardiopulmonary distress.  HEENT: No facial asymmetry, EOMI,   oropharynx pink and moist.  Neck supple no JVD, no mass.  Chest: Clear to auscultation bilaterally.  CVS: S1, S2 no murmurs, no S3.Regular rate.  ABD: Soft non tender.   Ext: No edema  MS: Adequate ROM spine, shoulders, hips and knees.  Skin: Intact, no ulcerations or rash noted.  Psych: Good eye contact, normal affect. Memory intact not anxious or depressed appearing.  CNS: CN 2-12 intact, power,  normal throughout.no focal deficits noted.   Assessment & Plan  Hyperlipidemia LDL goal <100 Hyperlipidemia:Low fat diet discussed and encouraged.   Lipid Panel  Lab Results  Component Value Date   CHOL 221 (H) 05/11/2018   HDL 84 05/11/2018   LDLCALC 114 (H) 05/11/2018   TRIG 123 05/11/2018   CHOLHDL 2.6 05/11/2018   Uncontrolled , f/u lab currently due     Essential hypertension Controlled, no change in medication DASH diet and commitment to daily physical activity for a minimum of 30 minutes discussed and encouraged, as a part of hypertension management. The importance of attaining a healthy weight is also discussed.  BP/Weight 11/04/2018 09/10/2018 09/08/2018 05/12/2018 02/05/2018 01/05/2018 79/02/8332  Systolic BP 832 919 166 060 045 997 741  Diastolic BP 74 70 78 82 72 68 84  Wt. (Lbs) 133 128.8 130.04 130 128.4 129 129  BMI 21.47 21.11 21.31 21.3 21.04 21.14 21.47       GERD Mildly symptomatic on an intermittent basis , uses low dose PPI as needed

## 2018-11-18 ENCOUNTER — Other Ambulatory Visit: Payer: Self-pay | Admitting: Family Medicine

## 2018-12-02 ENCOUNTER — Ambulatory Visit (HOSPITAL_COMMUNITY)
Admission: RE | Admit: 2018-12-02 | Discharge: 2018-12-02 | Disposition: A | Discharge status: Home / Self Care | Payer: PPO | Source: Ambulatory Visit | Attending: Family Medicine | Admitting: Family Medicine

## 2018-12-02 ENCOUNTER — Other Ambulatory Visit: Payer: Self-pay | Admitting: Family Medicine

## 2018-12-02 ENCOUNTER — Ambulatory Visit (INDEPENDENT_AMBULATORY_CARE_PROVIDER_SITE_OTHER): Payer: PPO | Admitting: Family Medicine

## 2018-12-02 ENCOUNTER — Encounter: Payer: Self-pay | Admitting: Family Medicine

## 2018-12-02 VITALS — BP 124/56 | HR 87 | Temp 99.5°F | Resp 14 | Ht 65.5 in | Wt 132.0 lb

## 2018-12-02 DIAGNOSIS — I10 Essential (primary) hypertension: Secondary | ICD-10-CM | POA: Diagnosis not present

## 2018-12-02 DIAGNOSIS — J209 Acute bronchitis, unspecified: Secondary | ICD-10-CM | POA: Diagnosis not present

## 2018-12-02 DIAGNOSIS — R509 Fever, unspecified: Secondary | ICD-10-CM | POA: Diagnosis not present

## 2018-12-02 DIAGNOSIS — R918 Other nonspecific abnormal finding of lung field: Secondary | ICD-10-CM

## 2018-12-02 DIAGNOSIS — J0100 Acute maxillary sinusitis, unspecified: Secondary | ICD-10-CM | POA: Diagnosis not present

## 2018-12-02 DIAGNOSIS — R05 Cough: Secondary | ICD-10-CM | POA: Diagnosis not present

## 2018-12-02 DIAGNOSIS — R0602 Shortness of breath: Secondary | ICD-10-CM | POA: Diagnosis not present

## 2018-12-02 LAB — POC INFLUENZA A&B (BINAX/QUICKVUE)
Influenza A, POC: NEGATIVE
Influenza B, POC: NEGATIVE

## 2018-12-02 MED ORDER — BENZONATATE 100 MG PO CAPS: 100.0000 mg | ORAL_CAPSULE | Freq: Two times a day (BID) | ORAL | 0 refills | Status: DC | PRN

## 2018-12-02 MED ORDER — CEFTRIAXONE SODIUM 500 MG IJ SOLR
500.0000 mg | Freq: Once | INTRAMUSCULAR | Status: AC
  Administered 2018-12-02: 500 mg via INTRAMUSCULAR

## 2018-12-02 MED ORDER — PENICILLIN V POTASSIUM 500 MG PO TABS: 500.0000 mg | ORAL_TABLET | Freq: Three times a day (TID) | ORAL | 0 refills | Status: DC

## 2018-12-02 MED ORDER — PROMETHAZINE-DM 6.25-15 MG/5ML PO SYRP: ORAL_SOLUTION | ORAL | 0 refills | Status: DC

## 2018-12-02 NOTE — Progress Notes (Signed)
Dg cxr  

## 2018-12-02 NOTE — Patient Instructions (Addendum)
F/u as before , call if you need me sooner  You are treated for acute bronchitis, please get CXR today at the hospital  You have received Rocephin injection in the office and penicillin along with 2 other medications, a decongestant , and a cough suppressant are prescribed  Your test for influenza is negative  Take time to fully recover , which you will!  Best for the season and 2020!     Acute Bronchitis, Adult Acute bronchitis is when air tubes (bronchi) in the lungs suddenly get swollen. The condition can make it hard to breathe. It can also cause these symptoms:  A cough.  Coughing up clear, yellow, or green mucus.  Wheezing.  Chest congestion.  Shortness of breath.  A fever.  Body aches.  Chills.  A sore throat.  Follow these instructions at home: Medicines  Take over-the-counter and prescription medicines only as told by your doctor.  If you were prescribed an antibiotic medicine, take it as told by your doctor. Do not stop taking the antibiotic even if you start to feel better. General instructions  Rest.  Drink enough fluids to keep your pee (urine) clear or pale yellow.  Avoid smoking and secondhand smoke. If you smoke and you need help quitting, ask your doctor. Quitting will help your lungs heal faster.  Use an inhaler, cool mist vaporizer, or humidifier as told by your doctor.  Keep all follow-up visits as told by your doctor. This is important. How is this prevented? To lower your risk of getting this condition again:  Wash your hands often with soap and water. If you cannot use soap and water, use hand sanitizer.  Avoid contact with people who have cold symptoms.  Try not to touch your hands to your mouth, nose, or eyes.  Make sure to get the flu shot every year.  Contact a doctor if:  Your symptoms do not get better in 2 weeks. Get help right away if:  You cough up blood.  You have chest pain.  You have very bad shortness of  breath.  You become dehydrated.  You faint (pass out) or keep feeling like you are going to pass out.  You keep throwing up (vomiting).  You have a very bad headache.  Your fever or chills gets worse. This information is not intended to replace advice given to you by your health care provider. Make sure you discuss any questions you have with your health care provider. Document Released: 05/27/2008 Document Revised: 07/17/2016 Document Reviewed: 05/29/2016 Elsevier Interactive Patient Education  [employer].

## 2018-12-02 NOTE — Assessment & Plan Note (Signed)
CXR, Rocephin  500 mg iM, and penicillin and decongestants prescribed and phenergan dm

## 2018-12-02 NOTE — Progress Notes (Signed)
   LISETH WANN     MRN: [id number]      DOB: 06/19/1934   HPI Ms. Umstead  3 day  h/o worsening head and chest congestion, associated with fever and chills intermittently.  Sputum is thick and yellow. C/o bilateral ear pressure, denies hearing loss and sore throat. Increasing fatigue , poor appetitie and sleep disturbed by cough. No improvement with OTC medication. C/o generalized body aches from excessive cough No other family member is ill    ROS Denies chest pains, palpitations and leg swelling Denies abdominal pain, nausea, vomiting,diarrhea or constipation.   Denies dysuria, frequency, hesitancy or incontinence. Denies joint pain, swelling and limitation in mobility. Denies headaches, seizures, numbness, or tingling. Denies depression, anxiety or insomnia. Denies skin break down or rash.   PE  BP (!) 124/56 (BP Location: Left Arm, Patient Position: Sitting, Cuff Size: Normal)   Pulse 87   Temp 99.5 F (37.5 C) (Oral)   Resp 14   Ht 5' 5.5" (1.664 m)   Wt 132 lb (59.9 kg)   SpO2 95% Comment: room air  BMI 21.63 kg/m   Patient alert, ill appearing and in no cardiopulmonary distress.  HEENT: No facial asymmetry, EOMI,   oropharynx pink and moist.  Neck supple no JVD, no mass.Maxillary sinus tenderness, TM clear  Chest: decreased air entry though adequate few crackles and bronchial breath sounds in lung base, no wheeze CVS: S1, S2 no murmurs, no S3.Regular rate.  ABD: Soft non tender.   Ext: No edema  MS: Adequate ROM spine, shoulders, hips and knees.  Skin: Intact, no ulcerations or rash noted.  Psych: Good eye contact, normal affect. Memory intact not anxious or depressed appearing.  CNS: CN 2-12 intact, power,  normal throughout.no focal deficits noted.   Assessment & Plan  Acute bronchitis CXR, Rocephin  500 mg iM, and penicillin and decongestants prescribed and phenergan dm  Acute maxillary sinusitis Antibiotic course prescribed, tylenol given for  pain  Essential hypertension Controlled, no change in medication   Fever and chills Negative n/P swab for influenza

## 2018-12-04 ENCOUNTER — Emergency Department (HOSPITAL_COMMUNITY): Payer: PPO

## 2018-12-04 ENCOUNTER — Emergency Department (HOSPITAL_COMMUNITY)
Admission: EM | Admit: 2018-12-04 | Discharge: 2018-12-04 | Disposition: A | Discharge status: Home / Self Care | Payer: PPO | Attending: Emergency Medicine | Admitting: Emergency Medicine

## 2018-12-04 ENCOUNTER — Encounter (HOSPITAL_COMMUNITY): Payer: Self-pay | Admitting: Emergency Medicine

## 2018-12-04 ENCOUNTER — Other Ambulatory Visit: Payer: Self-pay

## 2018-12-04 DIAGNOSIS — R110 Nausea: Secondary | ICD-10-CM | POA: Diagnosis not present

## 2018-12-04 DIAGNOSIS — R6889 Other general symptoms and signs: Secondary | ICD-10-CM

## 2018-12-04 DIAGNOSIS — Z79899 Other long term (current) drug therapy: Secondary | ICD-10-CM | POA: Insufficient documentation

## 2018-12-04 DIAGNOSIS — R6883 Chills (without fever): Secondary | ICD-10-CM | POA: Diagnosis present

## 2018-12-04 DIAGNOSIS — J111 Influenza due to unidentified influenza virus with other respiratory manifestations: Secondary | ICD-10-CM | POA: Diagnosis not present

## 2018-12-04 DIAGNOSIS — I10 Essential (primary) hypertension: Secondary | ICD-10-CM | POA: Insufficient documentation

## 2018-12-04 DIAGNOSIS — R05 Cough: Secondary | ICD-10-CM | POA: Diagnosis not present

## 2018-12-04 DIAGNOSIS — R5383 Other fatigue: Secondary | ICD-10-CM | POA: Diagnosis not present

## 2018-12-04 LAB — URINALYSIS, ROUTINE W REFLEX MICROSCOPIC
Bacteria, UA: NONE SEEN
Bilirubin Urine: NEGATIVE
Glucose, UA: NEGATIVE mg/dL
Ketones, ur: NEGATIVE mg/dL
Leukocytes, UA: NEGATIVE
Nitrite: NEGATIVE
PH: 7 (ref 5.0–8.0)
Protein, ur: NEGATIVE mg/dL
SPECIFIC GRAVITY, URINE: 1.003 — AB (ref 1.005–1.030)

## 2018-12-04 LAB — COMPREHENSIVE METABOLIC PANEL
ALBUMIN: 3.6 g/dL (ref 3.5–5.0)
ALT: 23 U/L (ref 0–44)
AST: 37 U/L (ref 15–41)
Alkaline Phosphatase: 78 U/L (ref 38–126)
Anion gap: 8 (ref 5–15)
BILIRUBIN TOTAL: 0.7 mg/dL (ref 0.3–1.2)
BUN: 14 mg/dL (ref 8–23)
CO2: 27 mmol/L (ref 22–32)
CREATININE: 0.82 mg/dL (ref 0.44–1.00)
Calcium: 8.9 mg/dL (ref 8.9–10.3)
Chloride: 102 mmol/L (ref 98–111)
GFR calc Af Amer: >60 mL/min (ref >60)
GLUCOSE: 105 mg/dL — AB (ref 70–99)
Potassium: 3.4 mmol/L — ABNORMAL LOW (ref 3.5–5.1)
Sodium: 137 mmol/L (ref 135–145)
TOTAL PROTEIN: 6.8 g/dL (ref 6.5–8.1)

## 2018-12-04 LAB — CBC WITH DIFFERENTIAL/PLATELET
ABS IMMATURE GRANULOCYTES: 0.01 10*3/uL (ref 0.00–0.07)
BASOS ABS: 0 10*3/uL (ref 0.0–0.1)
Basophils Relative: 1 %
Eosinophils Absolute: 0 10*3/uL (ref 0.0–0.5)
Eosinophils Relative: 1 %
HEMATOCRIT: 41.5 % (ref 36.0–46.0)
HEMOGLOBIN: 13.3 g/dL (ref 12.0–15.0)
Immature Granulocytes: 0 %
LYMPHS ABS: 1 10*3/uL (ref 0.7–4.0)
LYMPHS PCT: 31 %
MCH: 30 pg (ref 26.0–34.0)
MCHC: 32 g/dL (ref 30.0–36.0)
MCV: 93.5 fL (ref 80.0–100.0)
MONOS PCT: 20 %
Monocytes Absolute: 0.6 10*3/uL (ref 0.1–1.0)
NEUTROS ABS: 1.4 10*3/uL — AB (ref 1.7–7.7)
NEUTROS PCT: 47 %
Platelets: 178 10*3/uL (ref 150–400)
RBC: 4.44 MIL/uL (ref 3.87–5.11)
RDW: 13.1 % (ref 11.5–15.5)
WBC: 3.1 10*3/uL — ABNORMAL LOW (ref 4.0–10.5)
nRBC: 0 % (ref 0.0–0.2)

## 2018-12-04 LAB — I-STAT CG4 LACTIC ACID, ED: LACTIC ACID, VENOUS: 0.99 mmol/L (ref 0.5–1.9)

## 2018-12-04 MED ORDER — SODIUM CHLORIDE 0.9 % IV SOLN
INTRAVENOUS | Status: DC
  Administered 2018-12-04: 12:00:00 via INTRAVENOUS

## 2018-12-04 MED ORDER — SODIUM CHLORIDE 0.9 % IV BOLUS
500.0000 mL | Freq: Once | INTRAVENOUS | Status: AC
  Administered 2018-12-04: 500 mL via INTRAVENOUS

## 2018-12-04 NOTE — Discharge Instructions (Addendum)
Continue current prescription medications.  Today's work-up without evidence of pneumonia.  Return for any new or worse symptoms.

## 2018-12-04 NOTE — ED Provider Notes (Signed)
[facility] EMERGENCY DEPARTMENT Provider Note   CSN: [id number] Arrival date & time: 12/04/18  1004     History   Chief Complaint Chief Complaint  Patient presents with  . Nausea  . Spasms    HPI ALAZAE CRYMES is a 84 y.o. female.  Patient started to feel ill on Saturday.  On Monday or Tuesday she saw her primary care doctor.  Was started on an antibiotic which she thinks is penicillin.  Reports that flu test was negative at that time.  Patient states that today she is feeling worse she is having some chills.  She had some nausea no vomiting no diarrhea.  Clinically some concern for developing pneumonia she also did have a chest x-ray earlier in the week that was negative.  She is followed by Dr. Moshe Cipro.  Patient's had decreased appetite she not eating or drinking well.     Past Medical History:  Diagnosis Date  . Blindness of right eye   . BMI between 19-24,adult 2010 145 LBS  . Diverticula of small intestine Dec 2010  . Diverticulosis    [place]  . Functional constipation 2010  . GERD (gastroesophageal reflux disease)   . HTN (hypertension)   . Hyperlipemia   . Right eye trauma 1949   Loss of vision resulted at age 70  . Schatzki's ring Dec 2010    Patient Active Problem List   Diagnosis Date Noted  . Acute bronchitis 12/02/2018  . Allergic rhinitis 11/08/2017  . Skin lesions, generalized 06/19/2015  . Tubular adenoma 11/09/2013  . Family hx of colon cancer 11/09/2013  . Urinary urgency 10/31/2013  . Pancreatic cyst 02/05/2013  . Nocturia 12/02/2011  . Renal cyst 05/27/2011  . GERD 11/20/2009  . Hyperlipidemia LDL goal <100 03/31/2008  . BLINDNESS, RIGHT EYE 03/31/2008  . Essential hypertension 03/31/2008  . DIVERTICULOSIS, SIGMOID COLON 03/31/2008  . Osteoporosis 03/31/2008    Past Surgical History:  Procedure Laterality Date  . ABDOMINAL HYSTERECTOMY    . BREAST REDUCTION SURGERY  1992 BIL  . COLONOSCOPY  DEC 2010   SIMPLE ADENOMAS (6-8), [place] TICS,  SML IH  . COLONOSCOPY N/A 12/08/2013   Procedure: COLONOSCOPY;  Surgeon: Rogene Houston, MD;  Location: [facility] ENDO SUITE;  Service: Endoscopy;  Laterality: N/A;  225  . EVISCERATION Right 05/08/2015   Procedure: EVISCERATION WITH IMPLANT RIGHT EYE;  Surgeon: Williams Che, MD;  Location: [facility] ORS;  Service: Ophthalmology;  Laterality: Right;  . EYE SURGERY  RIGHT 2005  . EYE SURGERY  right 2016   removed and false eye placed in 08/2015  . TOTAL ABDOMINAL HYSTERECTOMY W/ BILATERAL SALPINGOOPHORECTOMY  1985 FIBROIDS  . UPPER GASTROINTESTINAL ENDOSCOPY  DEC 2010   Bx-MILD GASTRITIS/DUODENITIS, DUODENAL DIVERTICULA, SCHATZKI'S RING     OB History   No obstetric history on file.      Home Medications    Prior to Admission medications   Medication Sig Start Date End Date Taking? Authorizing Provider  benzonatate (TESSALON) 100 MG capsule Take 1 capsule (100 mg total) by mouth 2 (two) times daily as needed for cough. 12/02/18  Yes Fayrene Helper, MD  Multiple Vitamin (MULTI-VITAMIN DAILY PO) Take by mouth daily. doTerra Women -  Bone Nutireint   Yes [provider]  omeprazole (PRILOSEC) 20 MG capsule TAKE 1 CAPSULE BY MOUTH DAILY AS NEEDED 11/18/18  Yes Fayrene Helper, MD  penicillin v potassium (VEETID) 500 MG tablet Take 1 tablet (500 mg total) by mouth 3 (  three) times daily. 12/02/18  Yes Fayrene Helper, MD  pravastatin (PRAVACHOL) 20 MG tablet TAKE 1 TABLET(20 MG) BY MOUTH EVERY EVENING 11/18/18  Yes Fayrene Helper, MD  promethazine-dextromethorphan (PROMETHAZINE-DM) 6.25-15 MG/5ML syrup 2.5 cc at bedtime , as needed , for excessive cough 12/02/18  Yes Fayrene Helper, MD  ramipril (ALTACE) 5 MG capsule TAKE 1 CAPSULE(5 MG) BY MOUTH DAILY 05/30/17  Yes Fayrene Helper, MD    Family History Family History  Problem Relation Age of Onset  . Pancreatic cancer Sister   . Colon cancer Paternal Aunt   . Colon polyps Neg Hx     Social History Social  History   Tobacco Use  . Smoking status: Never Smoker  . Smokeless tobacco: Never Used  Substance Use Topics  . Alcohol use: No    Alcohol/week: 0.0 standard drinks  . Drug use: No     Allergies   Codeine and Fosamax [alendronate sodium]   Review of Systems Review of Systems  Constitutional: Positive for appetite change, chills and fatigue. Negative for fever.  HENT: Positive for congestion.   Respiratory: Positive for cough.   Cardiovascular: Negative for chest pain.  Gastrointestinal: Positive for nausea. Negative for abdominal pain, diarrhea and vomiting.  Genitourinary: Negative for dysuria.  Musculoskeletal: Positive for myalgias.  Skin: Negative for rash.  Neurological: Negative for dizziness, syncope and headaches.  Hematological: Does not bruise/bleed easily.  Psychiatric/Behavioral: Negative for confusion.     Physical Exam Updated Vital Signs BP 136/70   Pulse 68   Temp 97.6 F (36.4 C) (Oral)   Resp 14   SpO2 90%   Physical Exam Vitals signs and nursing note reviewed.  Constitutional:      General: She is not in acute distress.    Appearance: She is ill-appearing. She is not toxic-appearing or diaphoretic.  HENT:     Head: Normocephalic and atraumatic.     Mouth/Throat:     Mouth: Mucous membranes are dry.  Eyes:     Extraocular Movements: Extraocular movements intact.     Conjunctiva/sclera: Conjunctivae normal.     Pupils: Pupils are equal, round, and reactive to light.  Neck:     Musculoskeletal: Normal range of motion and neck supple. No neck rigidity.  Cardiovascular:     Rate and Rhythm: Normal rate and regular rhythm.     Heart sounds: No murmur.  Pulmonary:     Effort: Pulmonary effort is normal. No respiratory distress.     Breath sounds: Normal breath sounds. No wheezing or rales.  Abdominal:     General: Abdomen is flat. Bowel sounds are normal.     Tenderness: There is no abdominal tenderness.  Musculoskeletal: Normal range of  motion.        General: No swelling.  Skin:    General: Skin is warm.     Capillary Refill: Capillary refill takes less than 2 seconds.     Coloration: Skin is not jaundiced.     Findings: No rash.  Neurological:     General: No focal deficit present.     Mental Status: She is alert and oriented to person, place, and time.      ED Treatments / Results  Labs (all labs ordered are listed, but only abnormal results are displayed) Labs Reviewed  CBC WITH DIFFERENTIAL/PLATELET - Abnormal; Notable for the following components:      Result Value   WBC 3.1 (*)    Neutro Abs 1.4 (*)  All other components within normal limits  COMPREHENSIVE METABOLIC PANEL - Abnormal; Notable for the following components:   Potassium 3.4 (*)    Glucose, Bld 105 (*)    All other components within normal limits  URINALYSIS, ROUTINE W REFLEX MICROSCOPIC - Abnormal; Notable for the following components:   Color, Urine STRAW (*)    Specific Gravity, Urine 1.003 (*)    Hgb urine dipstick SMALL (*)    All other components within normal limits  CULTURE, BLOOD (ROUTINE X 2)  CULTURE, BLOOD (ROUTINE X 2)  I-STAT CG4 LACTIC ACID, ED    EKG None  Radiology Dg Chest 2 View  Result Date: 12/04/2018 CLINICAL DATA:  Cough and congestion EXAM: CHEST - 2 VIEW COMPARISON:  December 02, 2018 FINDINGS: The area of infiltrate in the right upper lobe abutting the right minor fissure is no longer appreciable. There is currently no demonstrable edema or consolidation. The heart size and pulmonary vascularity are normal. There is aortic atherosclerosis. No adenopathy. No bone lesions evident. IMPRESSION: Currently no edema or consolidation. Interval clearing small area of infiltrate in the right upper lobe. Stable cardiac silhouette. There is aortic atherosclerosis. Aortic Atherosclerosis (ICD10-I70.0). Electronically Signed   By: Lowella Grip III M.D.   On: 12/04/2018 11:44    Procedures Procedures (including  critical care time)  Medications Ordered in ED Medications  0.9 %  sodium chloride infusion ( Intravenous New Bag/Given 12/04/18 1200)  sodium chloride 0.9 % bolus 500 mL ( Intravenous Rate/Dose Verify 12/04/18 1200)     Initial Impression / Assessment and Plan / ED Course  I have reviewed the triage vital signs and the nursing notes.  Pertinent labs & imaging results that were available during my care of the patient were reviewed by me and considered in my medical decision making (see chart for details).    Patient's symptoms clinically are very suggestive of flulike illness.  Concern here with her feeling worse is that she is gone on to develop pneumonia despite the chest x-ray earlier in the week.  With her symptoms getting worse feel that it needs to be repeated.  Chest x-ray here without any acute findings.  White blood cell count is a little bit on the low side suggestive of a viral illness.  Patient received IV fluids here clinically her mucous membranes were dry and she feels much better with that.  She feels well enough to go home.  Feel that this is probably a flulike illness most likely viral based.  In addition patient had a lactic acid checked which was normal.  Her vital signs here have not been of any concern with tachycardia or hypotension.  No documented fever here.  Patient has medications already provided by her primary care provider.  She will return for any new or worse symptoms.   Final Clinical Impressions(s) / ED Diagnoses   Final diagnoses:  Flu-like symptoms    ED Discharge Orders    None       Fredia Sorrow, MD 12/04/18 1600

## 2018-12-04 NOTE — ED Triage Notes (Signed)
Pt is being treated for bronchitis this week with PCN.  States waking up this am at 0900 with "teeth chattering" and intermittent nausea.

## 2018-12-05 ENCOUNTER — Encounter: Payer: Self-pay | Admitting: Family Medicine

## 2018-12-05 DIAGNOSIS — J0100 Acute maxillary sinusitis, unspecified: Secondary | ICD-10-CM | POA: Insufficient documentation

## 2018-12-05 DIAGNOSIS — R509 Fever, unspecified: Secondary | ICD-10-CM | POA: Insufficient documentation

## 2018-12-05 NOTE — Assessment & Plan Note (Signed)
Negative n/P swab for influenza

## 2018-12-05 NOTE — Assessment & Plan Note (Signed)
Antibiotic course prescribed, tylenol given for pain

## 2018-12-05 NOTE — Assessment & Plan Note (Signed)
Controlled, no change in medication  

## 2018-12-08 ENCOUNTER — Telehealth: Payer: Self-pay | Admitting: Family Medicine

## 2018-12-08 NOTE — Telephone Encounter (Signed)
Staes still no energy. Cough is less and sputum is clear. I advised, no need to have rept CXR since the last one was "clear" call if still no better in 2 weeks, ED if needed before

## 2018-12-09 LAB — CULTURE, BLOOD (ROUTINE X 2)
Culture: NO GROWTH
Culture: NO GROWTH

## 2018-12-22 ENCOUNTER — Encounter: Payer: Self-pay | Admitting: *Deleted

## 2019-01-06 ENCOUNTER — Telehealth: Payer: Self-pay

## 2019-01-06 ENCOUNTER — Ambulatory Visit (INDEPENDENT_AMBULATORY_CARE_PROVIDER_SITE_OTHER): Payer: PPO

## 2019-01-06 VITALS — BP 124/80 | HR 82 | Resp 10 | Ht 65.0 in | Wt 129.0 lb

## 2019-01-06 DIAGNOSIS — Z0000 Encounter for general adult medical examination without abnormal findings: Secondary | ICD-10-CM

## 2019-01-06 DIAGNOSIS — M818 Other osteoporosis without current pathological fracture: Secondary | ICD-10-CM

## 2019-01-06 DIAGNOSIS — Z1231 Encounter for screening mammogram for malignant neoplasm of breast: Secondary | ICD-10-CM

## 2019-01-06 NOTE — Progress Notes (Signed)
Subjective:   Kelsey Nelson is a 85 y.o. female who presents for Medicare Annual (Subsequent) preventive examination.  Review of Systems:   Cardiac Risk Factors include: advanced age (>25men, >55 women)     Objective:     Vitals: BP 124/80   Pulse 82   Resp 10   Ht 5\' 5"  (1.651 m)   Wt 129 lb (58.5 kg)   SpO2 95%   BMI 21.47 kg/m   Body mass index is 21.47 kg/m.  Advanced Directives 01/06/2019 12/04/2018 01/05/2018 07/22/2015 05/04/2015 12/08/2013  Does Patient Have a Medical Advance Directive? Yes No;Yes Yes Yes No Patient has advance directive, copy not in chart  Type of Advance Directive Healthcare Power of [place];Living will - - - [place];Living will  Does patient want to make changes to medical advance directive? No - Patient declined - - - - -  Copy of [place] in Chart? Yes - validated most recent copy scanned in chart (See row information) - - Yes - Copy requested from family  Would patient like information on creating a medical advance directive? - - - - No - patient declined information -  Pre-existing out of facility DNR order (yellow form or pink MOST form) - - - - - No    Tobacco Social History   Tobacco Use  Smoking Status Never Smoker  Smokeless Tobacco Never Used     Counseling given: Not Answered   Clinical Intake:  Pre-visit preparation completed: Yes  Pain : No/denies pain Pain Score: 0-No pain     BMI - recorded: 21.47 Nutritional Status: BMI of 19-24  Normal Nutritional Risks: None Diabetes: No  How often do you need to have someone help you when you read instructions, pamphlets, or other written materials from your doctor or pharmacy?: 1 - Never What is the last grade level you completed in school?: 12 grade   Interpreter Needed?: No  Information entered by :: Francena Hanly LPN  Past Medical History:  Diagnosis Date  . Blindness of right eye   . BMI between  19-24,adult 2010 145 LBS  . Diverticula of small intestine Dec 2010  . Diverticulosis    [place]  . Functional constipation 2010  . GERD (gastroesophageal reflux disease)   . HTN (hypertension)   . Hyperlipemia   . Right eye trauma 1949   Loss of vision resulted at age 62  . Schatzki's ring Dec 2010   Past Surgical History:  Procedure Laterality Date  . ABDOMINAL HYSTERECTOMY    . BREAST REDUCTION SURGERY  1992 BIL  . COLONOSCOPY  DEC 2010   SIMPLE ADENOMAS (6-8), [place] TICS, SML IH  . COLONOSCOPY N/A 12/08/2013   Procedure: COLONOSCOPY;  Surgeon: Rogene Houston, MD;  Location: [facility];  Service: Endoscopy;  Laterality: N/A;  225  . EVISCERATION Right 05/08/2015   Procedure: EVISCERATION WITH IMPLANT RIGHT EYE;  Surgeon: Williams Che, MD;  Location: [facility] ORS;  Service: Ophthalmology;  Laterality: Right;  . EYE SURGERY  RIGHT 2005  . EYE SURGERY  right 2016   removed and false eye placed in 08/2015  . TOTAL ABDOMINAL HYSTERECTOMY W/ BILATERAL SALPINGOOPHORECTOMY  1985 FIBROIDS  . UPPER GASTROINTESTINAL ENDOSCOPY  DEC 2010   Bx-MILD GASTRITIS/DUODENITIS, DUODENAL DIVERTICULA, SCHATZKI'S RING   Family History  Problem Relation Age of Onset  . Pancreatic cancer Sister   . Colon cancer Paternal Aunt   . Colon polyps Neg  Hx    Social History   Socioeconomic History  . Marital status: Married    Spouse name: [name]   . Number of children: 3  . Years of education: 3  . Highest education level: 12th grade  Occupational History  . Occupation: retired   Scientific laboratory technician  . Financial resource strain: Not hard at all  . Food insecurity:    Worry: Never true    Inability: Never true  . Transportation needs:    Medical: No    Non-medical: No  Tobacco Use  . Smoking status: Never Smoker  . Smokeless tobacco: Never Used  Substance and Sexual Activity  . Alcohol use: No    Alcohol/week: 0.0 standard drinks  . Drug use: No  . Sexual activity: Not Currently  Lifestyle  .  Physical activity:    Days per week: 5 days    Minutes per session: 60 min  . Stress: Not at all  Relationships  . Social connections:    Talks on phone: Twice a week    Gets together: Once a week    Attends religious service: More than 4 times per year    Active member of club or organization: Yes    Attends meetings of clubs or organizations: More than 4 times per year    Relationship status: Married  Other Topics Concern  . Not on file  Social History Narrative   MARRIED TO DAVID Sliney. 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 benzonatate (TESSALON) 100 MG capsule Take 1 capsule (100 mg total) by mouth 2 (two) times daily as needed for cough.  . Multiple Vitamin (MULTI-VITAMIN DAILY PO) Take by mouth daily. doTerra Women -  Bone Nutireint  . omeprazole (PRILOSEC) 20 MG capsule TAKE 1 CAPSULE BY MOUTH DAILY AS NEEDED  . penicillin v potassium (VEETID) 500 MG tablet Take 1 tablet (500 mg total) by mouth 3 (three) times daily.  . pravastatin (PRAVACHOL) 20 MG tablet TAKE 1 TABLET(20 MG) BY MOUTH EVERY EVENING  . promethazine-dextromethorphan (PROMETHAZINE-DM) 6.25-15 MG/5ML syrup 2.5 cc at bedtime , as needed , for excessive cough  . ramipril (ALTACE) 5 MG capsule TAKE 1 CAPSULE(5 MG) BY MOUTH DAILY   No facility-administered encounter medications on file as of 01/06/2019.     Activities of Daily Living In your present state of health, do you have any difficulty performing the following activities: 01/06/2019  Hearing? N  Vision? N  Difficulty concentrating or making decisions? N  Walking or climbing stairs? N  Dressing or bathing? N  Doing errands, shopping? N  Preparing Food and eating ? N  Using the Toilet? N  In the past six months, have you accidently leaked urine? N  Do you have problems with loss of bowel control? N  Managing your Medications? N  Managing your Finances? N  Housekeeping or managing your  Housekeeping? N  Some recent data might be hidden    Patient Care Team: Fayrene Helper, MD as PCP - General (Family Medicine) Danie Binder, MD (Gastroenterology) Rogene Houston, MD (Gastroenterology) Virgia Land, MD as Referring Physician (Internal Medicine)    Assessment:   This is a routine wellness examination for Kelsey Nelson.  Exercise Activities and Dietary recommendations Current Exercise Habits: Home exercise routine, Time (Minutes): 60, Frequency (Times/Week): 5, Weekly Exercise (Minutes/Week): 300, Intensity: Moderate  Goals    . DIET - INCREASE WATER INTAKE  Fall Risk Fall Risk  01/06/2019 12/02/2018 11/04/2018 09/08/2018 05/12/2018  Falls in the past year? 0 0 0 No No  Number falls in past yr: - 0 - - -  Injury with Fall? - 0 - - -   Is the patient's home free of loose throw rugs in walkways, pet beds, electrical cords, etc?   yes      Grab bars in the bathroom? yes      Handrails on the stairs?   yes      Adequate lighting?   yes  Timed Get Up and Go performed: Patient able to perform in 6 seconds without assistance   Depression Screen PHQ 2/9 Scores 01/06/2019 12/02/2018 11/04/2018 09/08/2018  PHQ - 2 Score 0 3 0 0  PHQ- 9 Score - 11 8 -     Cognitive Function     6CIT Screen 01/06/2019 01/05/2018  What Year? 0 points 0 points  What month? 0 points 0 points  What time? 0 points 0 points  Count back from 20 0 points 0 points  Months in reverse 0 points 0 points  Repeat phrase 0 points 0 points  Total Score 0 0    Immunization History  Administered Date(s) Administered  . Influenza Split 09/24/2012, 10/19/2015  . Influenza Whole 12/31/2006, 11/06/2009, 10/29/2011  . Influenza, High Dose Seasonal PF 09/28/2018  . Influenza,inj,Quad PF,6+ Mos 10/26/2013, 12/05/2014, 10/25/2016  . Influenza-Unspecified 10/06/2017  . Pneumococcal Conjugate-13 07/11/2014  . Pneumococcal Polysaccharide-23 12/23/2001  . Td 08/06/2004  . Tdap 06/05/2015  .  Zoster 04/16/2007    Qualifies for Shingles Vaccine? Up to date   Screening Tests Health Maintenance  Topic Date Due  . TETANUS/TDAP  06/04/2025  . INFLUENZA VACCINE  Completed  . DEXA SCAN  Completed  . PNA vac Low Risk Adult  Completed    Cancer Screenings: Lung: Low Dose CT Chest recommended if Age 25-80 years, 30 pack-year currently smoking OR have quit w/in 15years. Patient does not qualify. Breast:  Up to date on Mammogram? Yes   Up to date of Bone Density/Dexa? Yes Colorectal: Up to date   Additional Screenings:  Hepatitis C Screening: N/A      Plan:   Continue to walk, increase water intake   I have personally reviewed and noted the following in the patient's chart:   . Medical and social history . Use of alcohol, tobacco or illicit drugs  . Current medications and supplements . Functional ability and status . Nutritional status . Physical activity . Advanced directives . List of other physicians . Hospitalizations, surgeries, and ER visits in previous 12 months . Vitals . Screenings to include cognitive, depression, and falls . Referrals and appointments  In addition, I have reviewed and discussed with patient certain preventive protocols, quality metrics, and best practice recommendations. A written personalized care plan for preventive services as well as general preventive health recommendations were provided to patient.     Hayden Pedro, LPN  3/76/2831

## 2019-01-06 NOTE — Telephone Encounter (Signed)
AWV done and DEXA and Mammo ordered

## 2019-01-06 NOTE — Patient Instructions (Signed)
[name] , Thank you for taking time to come for your Medicare Wellness Visit. I appreciate your ongoing commitment to your health goals. Please review the following plan we discussed and let me know if I can assist you in the future.   Screening recommendations/referrals: Colonoscopy: up to date  Mammogram: up to date  Bone Density: up to date  Recommended yearly ophthalmology/optometry visit for glaucoma screening and checkup Recommended yearly dental visit for hygiene and checkup  Vaccinations: Influenza vaccine: up to date  Pneumococcal vaccine: up to date  Tdap vaccine: up to date  Shingles vaccine: up to date     Advanced directives: In place   Conditions/risks identified: Hypertension, osteoporosis    Next appointment: Wellness visit in one year    Preventive Care 24 Years and Older, Female Preventive care refers to lifestyle choices and visits with your health care provider that can promote health and wellness. What does preventive care include?  A yearly physical exam. This is also called an annual well check.  Dental exams once or twice a year.  Routine eye exams. Ask your health care provider how often you should have your eyes checked.  Personal lifestyle choices, including:  Daily care of your teeth and gums.  Regular physical activity.  Eating a healthy diet.  Avoiding tobacco and drug use.  Limiting alcohol use.  Practicing safe sex.  Taking low-dose aspirin every day.  Taking vitamin and mineral supplements as recommended by your health care provider. What happens during an annual well check? The services and screenings done by your health care provider during your annual well check will depend on your age, overall health, lifestyle risk factors, and family history of disease. Counseling  Your health care provider may ask you questions about your:  Alcohol use.  Tobacco use.  Drug use.  Emotional well-being.  Home and relationship  well-being.  Sexual activity.  Eating habits.  History of falls.  Memory and ability to understand (cognition).  Work and work Statistician.  Reproductive health. Screening  You may have the following tests or measurements:  Height, weight, and BMI.  Blood pressure.  Lipid and cholesterol levels. These may be checked every 5 years, or more frequently if you are over 76 years old.  Skin check.  Lung cancer screening. You may have this screening every year starting at age 87 if you have a 30-pack-year history of smoking and currently smoke or have quit within the past 15 years.  Fecal occult blood test (FOBT) of the stool. You may have this test every year starting at age 1.  Flexible sigmoidoscopy or colonoscopy. You may have a sigmoidoscopy every 5 years or a colonoscopy every 10 years starting at age 23.  Hepatitis C blood test.  Hepatitis B blood test.  Sexually transmitted disease (STD) testing.  Diabetes screening. This is done by checking your blood sugar (glucose) after you have not eaten for a while (fasting). You may have this done every 1-3 years.  Bone density scan. This is done to screen for osteoporosis. You may have this done starting at age 74.  Mammogram. This may be done every 1-2 years. Talk to your health care provider about how often you should have regular mammograms. Talk with your health care provider about your test results, treatment options, and if necessary, the need for more tests. Vaccines  Your health care provider may recommend certain vaccines, such as:  Influenza vaccine. This is recommended every year.  Tetanus, diphtheria, and  acellular pertussis (Tdap, Td) vaccine. You may need a Td booster every 10 years.  Zoster vaccine. You may need this after age 33.  Pneumococcal 13-valent conjugate (PCV13) vaccine. One dose is recommended after age 6.  Pneumococcal polysaccharide (PPSV23) vaccine. One dose is recommended after age  9. Talk to your health care provider about which screenings and vaccines you need and how often you need them. This information is not intended to replace advice given to you by your health care provider. Make sure you discuss any questions you have with your health care provider. Document Released: 01/05/2016 Document Revised: 08/28/2016 Document Reviewed: 10/10/2015 Elsevier Interactive Patient Education  2017 [place] Prevention in the Home Falls can cause injuries. They can happen to people of all ages. There are many things you can do to make your home safe and to help prevent falls. What can I do on the outside of my home?  Regularly fix the edges of walkways and driveways and fix any cracks.  Remove anything that might make you trip as you walk through a door, such as a raised step or threshold.  Trim any bushes or trees on the path to your home.  Use bright outdoor lighting.  Clear any walking paths of anything that might make someone trip, such as rocks or tools.  Regularly check to see if handrails are loose or broken. Make sure that both sides of any steps have handrails.  Any raised decks and porches should have guardrails on the edges.  Have any leaves, snow, or ice cleared regularly.  Use sand or salt on walking paths during winter.  Clean up any spills in your garage right away. This includes oil or grease spills. What can I do in the bathroom?  Use night lights.  Install grab bars by the toilet and in the tub and shower. Do not use towel bars as grab bars.  Use non-skid mats or decals in the tub or shower.  If you need to sit down in the shower, use a plastic, non-slip stool.  Keep the floor dry. Clean up any water that spills on the floor as soon as it happens.  Remove soap buildup in the tub or shower regularly.  Attach bath mats securely with double-sided non-slip rug tape.  Do not have throw rugs and other things on the floor that can make  you trip. What can I do in the bedroom?  Use night lights.  Make sure that you have a light by your bed that is easy to reach.  Do not use any sheets or blankets that are too big for your bed. They should not hang down onto the floor.  Have a firm chair that has side arms. You can use this for support while you get dressed.  Do not have throw rugs and other things on the floor that can make you trip. What can I do in the kitchen?  Clean up any spills right away.  Avoid walking on wet floors.  Keep items that you use a lot in easy-to-reach places.  If you need to reach something above you, use a strong step stool that has a grab bar.  Keep electrical cords out of the way.  Do not use floor polish or wax that makes floors slippery. If you must use wax, use non-skid floor wax.  Do not have throw rugs and other things on the floor that can make you trip. What can I do with my stairs?  Do not leave any items on the stairs.  Make sure that there are handrails on both sides of the stairs and use them. Fix handrails that are broken or loose. Make sure that handrails are as long as the stairways.  Check any carpeting to make sure that it is firmly attached to the stairs. Fix any carpet that is loose or worn.  Avoid having throw rugs at the top or bottom of the stairs. If you do have throw rugs, attach them to the floor with carpet tape.  Make sure that you have a light switch at the top of the stairs and the bottom of the stairs. If you do not have them, ask someone to add them for you. What else can I do to help prevent falls?  Wear shoes that:  Do not have high heels.  Have rubber bottoms.  Are comfortable and fit you well.  Are closed at the toe. Do not wear sandals.  If you use a stepladder:  Make sure that it is fully opened. Do not climb a closed stepladder.  Make sure that both sides of the stepladder are locked into place.  Ask someone to hold it for you, if  possible.  Clearly mark and make sure that you can see:  Any grab bars or handrails.  First and last steps.  Where the edge of each step is.  Use tools that help you move around (mobility aids) if they are needed. These include:  Canes.  Walkers.  Scooters.  Crutches.  Turn on the lights when you go into a dark area. Replace any light bulbs as soon as they burn out.  Set up your furniture so you have a clear path. Avoid moving your furniture around.  If any of your floors are uneven, fix them.  If there are any pets around you, be aware of where they are.  Review your medicines with your doctor. Some medicines can make you feel dizzy. This can increase your chance of falling. Ask your doctor what other things that you can do to help prevent falls. This information is not intended to replace advice given to you by your health care provider. Make sure you discuss any questions you have with your health care provider. Document Released: 10/05/2009 Document Revised: 05/16/2016 Document Reviewed: 01/13/2015 Elsevier Interactive Patient Education  2017 [employer].

## 2019-01-13 ENCOUNTER — Ambulatory Visit (HOSPITAL_COMMUNITY)
Admission: RE | Admit: 2019-01-13 | Discharge: 2019-01-13 | Disposition: A | Discharge status: Home / Self Care | Payer: PPO | Source: Ambulatory Visit | Attending: Family Medicine | Admitting: Family Medicine

## 2019-01-13 DIAGNOSIS — Z1231 Encounter for screening mammogram for malignant neoplasm of breast: Secondary | ICD-10-CM | POA: Insufficient documentation

## 2019-01-13 DIAGNOSIS — M818 Other osteoporosis without current pathological fracture: Secondary | ICD-10-CM | POA: Diagnosis not present

## 2019-01-13 DIAGNOSIS — M8589 Other specified disorders of bone density and structure, multiple sites: Secondary | ICD-10-CM | POA: Diagnosis not present

## 2019-01-13 DIAGNOSIS — Z780 Asymptomatic menopausal state: Secondary | ICD-10-CM | POA: Diagnosis not present

## 2019-02-16 ENCOUNTER — Other Ambulatory Visit: Payer: Self-pay | Admitting: Family Medicine

## 2019-02-19 DIAGNOSIS — D2261 Melanocytic nevi of right upper limb, including shoulder: Secondary | ICD-10-CM | POA: Diagnosis not present

## 2019-02-19 DIAGNOSIS — Z23 Encounter for immunization: Secondary | ICD-10-CM | POA: Diagnosis not present

## 2019-02-19 DIAGNOSIS — D239 Other benign neoplasm of skin, unspecified: Secondary | ICD-10-CM | POA: Diagnosis not present

## 2019-02-19 DIAGNOSIS — L720 Epidermal cyst: Secondary | ICD-10-CM | POA: Diagnosis not present

## 2019-02-19 DIAGNOSIS — L821 Other seborrheic keratosis: Secondary | ICD-10-CM | POA: Diagnosis not present

## 2019-04-05 ENCOUNTER — Ambulatory Visit: Payer: PPO | Admitting: Family Medicine

## 2019-04-27 ENCOUNTER — Other Ambulatory Visit: Payer: Self-pay

## 2019-04-27 ENCOUNTER — Encounter: Payer: Self-pay | Admitting: Family Medicine

## 2019-04-27 ENCOUNTER — Ambulatory Visit (INDEPENDENT_AMBULATORY_CARE_PROVIDER_SITE_OTHER): Payer: PPO | Admitting: Family Medicine

## 2019-04-27 VITALS — BP 124/80 | Ht 65.0 in | Wt 129.0 lb

## 2019-04-27 DIAGNOSIS — R351 Nocturia: Secondary | ICD-10-CM

## 2019-04-27 DIAGNOSIS — I10 Essential (primary) hypertension: Secondary | ICD-10-CM

## 2019-04-27 DIAGNOSIS — E785 Hyperlipidemia, unspecified: Secondary | ICD-10-CM | POA: Diagnosis not present

## 2019-04-27 DIAGNOSIS — Z1321 Encounter for screening for nutritional disorder: Secondary | ICD-10-CM

## 2019-04-27 NOTE — Patient Instructions (Signed)
Annual exam in 6 months, call if you need me sooner  Please get fasting  CBC, lipid, cmp and eGFR, tSH and vit D the week of June 15 ( [facility])  Keep eye exam as we discussed  Social distancing. Frequent hand washing with soap and water Keeping your hands off of your face.wear a face mask and stay 6 ft away from people when outside of your home These 3 practices will help to keep both you and your community healthy during this time. Please practice them faithfully!  Thankful you are doing well!

## 2019-04-27 NOTE — Progress Notes (Signed)
Virtual Visit via Telephone Note  I connected with Kelsey Nelson on 04/27/19 at  8:20 AM EDT by telephone and verified that I am speaking with the correct person using two identifiers.  Location: Patient: in her home  Provider: office   I discussed the limitations, risks, security and privacy concerns of performing an evaluation and management service by telephone and the availability of in person appointments. I also discussed with the patient that there may be a patient responsible charge related to this service. The patient expressed understanding and agreed to proceed.   History of Present Illness: F/U chronic health conditions and ensure health maintenance is up to date Reports doing well, with no concrns exept the delay in her eye exam, which I explained is related to the virus Denies recent fever or chills. Denies sinus pressure, nasal congestion, ear pain or sore throat. Denies chest congestion, productive cough or wheezing. Denies chest pains, palpitations and leg swelling Denies abdominal pain, nausea, vomiting,diarrhea or constipation.   Denies dysuria, , hesitancy or incontinence.does have urgency and mild nocturia chronically, no interest in medication Denies joint pain, swelling and limitation in mobility. Denies headaches, seizures, numbness, or tingling. Denies depression, anxiety or insomnia. Denies skin break down or rash. She is walking regularly as she has in the past      Observations/Objective: BP 124/80   Ht 5\' 5"  (1.651 m)   Wt 129 lb (58.5 kg)   BMI 21.47 kg/m  Good communication with no confusion and intact memory. Alert and oriented x 3 No signs of respiratory distress during sppech    Assessment and Plan: Essential hypertension Controlled, no change in medication DASH diet and commitment to daily physical activity for a minimum of 30 minutes discussed and encouraged, as a part of hypertension management. The importance of attaining a healthy  weight is also discussed.  BP/Weight 04/27/2019 01/06/2019 12/04/2018 12/02/2018 11/04/2018 09/10/2018 5/99/3570  Systolic BP 177 939 030 092 330 076 226  Diastolic BP 80 80 70 56 74 70 78  Wt. (Lbs) 129 129 - 132 133 128.8 130.04  BMI 21.47 21.47 - 21.63 21.47 21.11 21.31       Nocturia Reports voiding 2 to 3 times overnight, desires no medication for this  Hyperlipidemia LDL goal <100 Hyperlipidemia:Low fat diet discussed and encouraged.   Lipid Panel  Lab Results  Component Value Date   CHOL 205 (H) 11/04/2018   HDL 90 11/04/2018   LDLCALC 97 11/04/2018   TRIG 87 11/04/2018   CHOLHDL 2.3 11/04/2018     Updated lab in next 4 to6 weeks, needs to reduce fat in diet    Follow Up Instructions:    I discussed the assessment and treatment plan with the patient. The patient was provided an opportunity to ask questions and all were answered. The patient agreed with the plan and demonstrated an understanding of the instructions.   The patient was advised to call back or seek an in-person evaluation if the symptoms worsen or if the condition fails to improve as anticipated.  I provided 25 minutes of non-face-to-face time during this encounter.   Tula Nakayama, MD

## 2019-05-03 NOTE — Assessment & Plan Note (Signed)
Hyperlipidemia:Low fat diet discussed and encouraged.   Lipid Panel  Lab Results  Component Value Date   CHOL 205 (H) 11/04/2018   HDL 90 11/04/2018   LDLCALC 97 11/04/2018   TRIG 87 11/04/2018   CHOLHDL 2.3 11/04/2018     Updated lab in next 4 to6 weeks, needs to reduce fat in diet

## 2019-05-03 NOTE — Assessment & Plan Note (Signed)
Controlled, no change in medication DASH diet and commitment to daily physical activity for a minimum of 30 minutes discussed and encouraged, as a part of hypertension management. The importance of attaining a healthy weight is also discussed.  BP/Weight 04/27/2019 01/06/2019 12/04/2018 12/02/2018 11/04/2018 09/10/2018 1/74/7159  Systolic BP 539 672 897 915 041 364 383  Diastolic BP 80 80 70 56 74 70 78  Wt. (Lbs) 129 129 - 132 133 128.8 130.04  BMI 21.47 21.47 - 21.63 21.47 21.11 21.31

## 2019-05-03 NOTE — Assessment & Plan Note (Signed)
Reports voiding 2 to 3 times overnight, desires no medication for this

## 2019-05-05 ENCOUNTER — Emergency Department (HOSPITAL_COMMUNITY)
Admission: EM | Admit: 2019-05-05 | Discharge: 2019-05-05 | Discharge status: Against Medical Advice | Payer: PPO | Attending: Emergency Medicine | Admitting: Emergency Medicine

## 2019-05-05 ENCOUNTER — Ambulatory Visit: Payer: Self-pay | Admitting: Family Medicine

## 2019-05-05 ENCOUNTER — Other Ambulatory Visit: Payer: PPO

## 2019-05-05 ENCOUNTER — Other Ambulatory Visit: Payer: Self-pay

## 2019-05-05 ENCOUNTER — Encounter (HOSPITAL_COMMUNITY): Payer: Self-pay | Admitting: Emergency Medicine

## 2019-05-05 DIAGNOSIS — Z79899 Other long term (current) drug therapy: Secondary | ICD-10-CM | POA: Insufficient documentation

## 2019-05-05 DIAGNOSIS — R6883 Chills (without fever): Secondary | ICD-10-CM

## 2019-05-05 DIAGNOSIS — R110 Nausea: Secondary | ICD-10-CM | POA: Insufficient documentation

## 2019-05-05 DIAGNOSIS — I10 Essential (primary) hypertension: Secondary | ICD-10-CM | POA: Diagnosis not present

## 2019-05-05 DIAGNOSIS — Z20822 Contact with and (suspected) exposure to covid-19: Secondary | ICD-10-CM

## 2019-05-05 DIAGNOSIS — Z5329 Procedure and treatment not carried out because of patient's decision for other reasons: Secondary | ICD-10-CM | POA: Diagnosis not present

## 2019-05-05 DIAGNOSIS — R6889 Other general symptoms and signs: Secondary | ICD-10-CM | POA: Diagnosis not present

## 2019-05-05 LAB — NOVEL CORONAVIRUS, NAA: SARS-CoV-2, NAA: NOT DETECTED

## 2019-05-05 NOTE — Telephone Encounter (Signed)
Temperature=98.4

## 2019-05-05 NOTE — ED Triage Notes (Signed)
PT c/o chills this am when she woke up. Pt denies any other symptoms.

## 2019-05-05 NOTE — ED Provider Notes (Signed)
[facility] EMERGENCY DEPARTMENT Provider Note   CSN: [id number] Arrival date & time: 05/05/19  0645    History   Chief Complaint Chief Complaint  Patient presents with   Chills    HPI Kelsey Nelson is a 85 y.o. female.     HPI  Pt was seen at 0850. Per pt, c/o sudden onset and resolution of one brief episode of "a chill" and "nausea" that occurred at 0630 this morning when she woke up. States her symptoms have resolved. States she called her PMD and was told to come to the ED for evaluation. Pt did not take her temperature. Pt denies any other symptoms. Denies SOB/cough, no CP/palpitations, no abd pain, no vomiting/diarrhea, no back pain, no focal motor weakness, no tingling/numbness in extremities.    Past Medical History:  Diagnosis Date   Blindness of right eye    BMI between 19-24,adult 2010 145 LBS   Diverticula of small intestine Dec 2010   Diverticulosis    [place]   Functional constipation 2010   GERD (gastroesophageal reflux disease)    HTN (hypertension)    Hyperlipemia    Right eye trauma 1949   Loss of vision resulted at age 26   Schatzki's ring Dec 2010    Patient Active Problem List   Diagnosis Date Noted   Allergic rhinitis 11/08/2017   Skin lesions, generalized 06/19/2015   Tubular adenoma 11/09/2013   Family hx of colon cancer 11/09/2013   Urinary urgency 10/31/2013   Pancreatic cyst 02/05/2013   Nocturia 12/02/2011   Renal cyst 05/27/2011   GERD 11/20/2009   Hyperlipidemia LDL goal <100 03/31/2008   BLINDNESS, RIGHT EYE 03/31/2008   Essential hypertension 03/31/2008   DIVERTICULOSIS, SIGMOID COLON 03/31/2008   Osteoporosis 03/31/2008    Past Surgical History:  Procedure Laterality Date   ABDOMINAL HYSTERECTOMY     BREAST REDUCTION SURGERY  1992 BIL   COLONOSCOPY  DEC 2010   SIMPLE ADENOMAS (6-8),  TICS, SML IH   COLONOSCOPY N/A 12/08/2013   Procedure: COLONOSCOPY;  Surgeon: Rogene Houston, MD;  Location:  [facility] END[facility] SUITE;  Service: Endoscopy;  Laterality: N/A;  225   EVISCERATION Right 05/08/2015   Procedure: EVISCERATION WITH IMPLANT RIGHT EYE;  Surgeon: Williams Che, MD;  Location: [facility] ORS;  Service: Ophthalmology;  Laterality: Right;   EYE SURGERY  RIGHT 2005   EYE SURGERY  right 2016   removed and false eye placed in 08/2015   TOTAL ABDOMINAL HYSTERECTOMY W/ BILATERAL SALPINGOOPHORECTOMY  1985 FIBROIDS   UPPER GASTROINTESTINAL ENDOSCOPY  DEC 2010   Bx-MILD GASTRITIS/DUODENITIS, DUODENAL DIVERTICULA, SCHATZKI'S RING     OB History    Gravida      Para      Term      Preterm      AB      Living  3     SAB      TAB      Ectopic      Multiple      Live Births               Home Medications    Prior to Admission medications   Medication Sig Start Date End Date Taking? Authorizing Provider  Multiple Vitamin (MULTI-VITAMIN DAILY PO) Take by mouth daily. [employer] Women -  Bone Nutireint    [provider]  omeprazole (PRILOSEC) 20 MG capsule Take 20 mg by mouth daily.    [provider]  pravastatin (PRAVACHOL) 20 MG  tablet TAKE 1 TABLET(20 MG) BY MOUTH EVERY EVENING 02/16/19   Fayrene Helper, MD  ramipril (ALTACE) 5 MG capsule TAKE 1 CAPSULE(5 MG) BY MOUTH DAILY 02/16/19   Fayrene Helper, MD    Family History Family History  Problem Relation Age of Onset   Pancreatic cancer Sister    Colon cancer Paternal Aunt    Colon polyps Neg Hx     Social History Social History   Tobacco Use   Smoking status: Never Smoker   Smokeless tobacco: Never Used  Substance Use Topics   Alcohol use: No    Alcohol/week: 0.0 standard drinks   Drug use: No     Allergies   Codeine and Fosamax [alendronate sodium]   Review of Systems Review of Systems ROS: Statement: All systems negative except as marked or noted in the HPI; Constitutional: Negative for fever and +one brief episode of "chill." ; ; Eyes: Negative for eye pain, redness  and discharge. ; ; ENMT: Negative for ear pain, hoarseness, nasal congestion, sinus pressure and sore throat. ; ; Cardiovascular: Negative for chest pain, palpitations, diaphoresis, dyspnea and peripheral edema. ; ; Respiratory: Negative for cough, wheezing and stridor. ; ; Gastrointestinal: +one episode of nausea. Negative for vomiting, diarrhea, abdominal pain, blood in stool, hematemesis, jaundice and rectal bleeding. . ; ; Genitourinary: Negative for dysuria, flank pain and hematuria. ; ; Musculoskeletal: Negative for back pain and neck pain. Negative for swelling and trauma.; ; Skin: Negative for pruritus, rash, abrasions, blisters, bruising and skin lesion.; ; Neuro: Negative for headache, lightheadedness and neck stiffness. Negative for weakness, altered level of consciousness, altered mental status, extremity weakness, paresthesias, involuntary movement, seizure and syncope.       Physical Exam Updated Vital Signs BP (!) 126/97    Pulse 70    Temp 98.1 F (36.7 C) (Oral)    Resp 18    Ht 5' 5.5" (1.664 m)    Wt 59 kg    SpO2 96%    BMI 21.30 kg/m     Physical Exam 0855: Physical examination:  Nursing notes reviewed; Vital signs and O2 SAT reviewed;  Constitutional: Well developed, Well nourished, Well hydrated, In no acute distress; Head:  Normocephalic, atraumatic; Eyes: EOMI, PERRL, No scleral icterus; ENMT: Mouth and pharynx normal, Mucous membranes moist; Neck: Supple, Full range of motion, No lymphadenopathy; Cardiovascular: Regular rate and rhythm, No gallop; Respiratory: Breath sounds clear & equal bilaterally, No wheezes.  Speaking full sentences with ease, Normal respiratory effort/excursion; Chest: Nontender, Movement normal; Abdomen: Soft, Nontender, Nondistended, Normal bowel sounds; Genitourinary: No CVA tenderness; Extremities: Peripheral pulses normal, No tenderness, No edema, No calf edema or asymmetry.; Neuro: AA&Ox3, Major CN grossly intact. No facial droop. Speech clear. No  gross focal motor or sensory deficits in extremities. Climbs on and off stretcher easily by herself. Gait steady..; Skin: Color normal, Warm, Dry.    ED Treatments / Results  Labs (all labs ordered are listed, but only abnormal results are displayed)   EKG None  Radiology   Procedures Procedures (including critical care time)  Medications Ordered in ED Medications - No data to display   Initial Impression / Assessment and Plan / ED Course  I have reviewed the triage vital signs and the nursing notes.  Pertinent labs & imaging results that were available during my care of the patient were reviewed by me and considered in my medical decision making (see chart for details).     MDM Reviewed: previous chart,  nursing note and vitals    0855:  Pt states her symptoms were very brief and continue resolved. States she is "just here to get a COVID test." Explained ED policy to pt. Pt states she did not want any other testing and walked out of the ED. NAD, resps easy, gait steady.      Final Clinical Impressions(s) / ED Diagnoses   Final diagnoses:  [place]    ED Discharge Orders    None       Francine Graven, DO 05/10/19 [id number]

## 2019-05-05 NOTE — Telephone Encounter (Signed)
[facility], Dr. Joycelyn Schmid Simpson's office called in and ordered a COVID-19 test for this pt. At the [facility].  Phone:  [phone] Fax:  [phone]  Pt was on the line with the scheduler from the office.

## 2019-05-06 ENCOUNTER — Telehealth: Payer: Self-pay | Admitting: Family Medicine

## 2019-05-06 NOTE — Telephone Encounter (Signed)
Pls call pt  Tomorrow again , if she does not call back She went for covid testing yesterday after being routed here from the ED. I tried speaking to her just to check on her and lend emotional support. I had to leave a message , she did not pick up Please try to contact her again tomorrow,thanks!

## 2019-05-06 NOTE — Telephone Encounter (Signed)
tel mssage sent to [employer]

## 2019-05-07 NOTE — Telephone Encounter (Signed)
Spoke with patient. She said she is doing ok and appreciates you calling to check in on her.

## 2019-05-07 NOTE — Telephone Encounter (Signed)
Noted, pls keep eyes open in case her report comes back before you leave today, thanks ( I don't really expect that it will, but JUST IN CASE)

## 2019-05-10 ENCOUNTER — Telehealth: Payer: Self-pay

## 2019-05-10 ENCOUNTER — Telehealth: Payer: Self-pay | Admitting: Family Medicine

## 2019-05-10 LAB — NOVEL CORONAVIRUS, NAA: SARS-CoV-2, NAA: NOT DETECTED

## 2019-05-10 NOTE — Telephone Encounter (Signed)
Spoke with patient and advised her of Covid 87 results which were negative,  with verbal understanding.

## 2019-05-10 NOTE — Telephone Encounter (Signed)
I also spoke with Ms [place]omb

## 2019-05-10 NOTE — Telephone Encounter (Signed)
I spoke directly with the patient , she feels well and is aware that she is covid negative

## 2019-05-10 NOTE — Telephone Encounter (Signed)
Called patient to give recent test results. No answer, no vm, will try again later.

## 2019-05-10 NOTE — Telephone Encounter (Signed)
Received patients Covid 19 results today. Called to give results. No answer, no vm, will try again later.

## 2019-06-08 DIAGNOSIS — Z1321 Encounter for screening for nutritional disorder: Secondary | ICD-10-CM | POA: Diagnosis not present

## 2019-06-08 DIAGNOSIS — E785 Hyperlipidemia, unspecified: Secondary | ICD-10-CM | POA: Diagnosis not present

## 2019-06-08 DIAGNOSIS — I10 Essential (primary) hypertension: Secondary | ICD-10-CM | POA: Diagnosis not present

## 2019-06-09 LAB — CBC
HCT: 42.7 % (ref 35.0–45.0)
Hemoglobin: 14.1 g/dL (ref 11.7–15.5)
MCH: 30.5 pg (ref 27.0–33.0)
MCHC: 33 g/dL (ref 32.0–36.0)
MCV: 92.2 fL (ref 80.0–100.0)
MPV: 11.9 fL (ref 7.5–12.5)
Platelets: 263 10*3/uL (ref 140–400)
RBC: 4.63 10*6/uL (ref 3.80–5.10)
RDW: 12.2 % (ref 11.0–15.0)
WBC: 4.9 10*3/uL (ref 3.8–10.8)

## 2019-06-09 LAB — COMPLETE METABOLIC PANEL WITH GFR
AG Ratio: 1.8 (calc) (ref 1.0–2.5)
ALT: 17 U/L (ref 6–29)
AST: 23 U/L (ref 10–35)
Albumin: 4.4 g/dL (ref 3.6–5.1)
Alkaline phosphatase (APISO): 119 U/L (ref 37–153)
BUN: 16 mg/dL (ref 7–25)
CO2: 32 mmol/L (ref 20–32)
Calcium: 9.9 mg/dL (ref 8.6–10.4)
Chloride: 102 mmol/L (ref 98–110)
Creat: 0.87 mg/dL (ref 0.60–0.88)
GFR, Est African American: 71 mL/min/{1.73_m2} (ref >=60)
GFR, Est Non African American: 62 mL/min/{1.73_m2} (ref >=60)
Globulin: 2.5 g/dL (calc) (ref 1.9–3.7)
Glucose, Bld: 88 mg/dL (ref 65–99)
Potassium: 4 mmol/L (ref 3.5–5.3)
Sodium: 140 mmol/L (ref 135–146)
Total Bilirubin: 0.5 mg/dL (ref 0.2–1.2)
Total Protein: 6.9 g/dL (ref 6.1–8.1)

## 2019-06-09 LAB — LIPID PANEL
Cholesterol: 247 mg/dL — ABNORMAL HIGH (ref <200)
HDL: 89 mg/dL (ref >=50)
LDL Cholesterol (Calc): 130 mg/dL (calc) — ABNORMAL HIGH
Non-HDL Cholesterol (Calc): 158 mg/dL (calc) — ABNORMAL HIGH (ref <130)
Total CHOL/HDL Ratio: 2.8 (calc) (ref <5.0)
Triglycerides: 166 mg/dL — ABNORMAL HIGH (ref <150)

## 2019-06-09 LAB — TSH: TSH: 3.21 mIU/L (ref 0.40–4.50)

## 2019-06-09 LAB — VITAMIN D 25 HYDROXY (VIT D DEFICIENCY, FRACTURES): Vit D, 25-Hydroxy: 53 ng/mL (ref 30–100)

## 2019-06-10 ENCOUNTER — Telehealth: Payer: Self-pay

## 2019-06-10 DIAGNOSIS — E785 Hyperlipidemia, unspecified: Secondary | ICD-10-CM

## 2019-06-10 DIAGNOSIS — I10 Essential (primary) hypertension: Secondary | ICD-10-CM

## 2019-06-10 NOTE — Telephone Encounter (Signed)
Fasting labs ordered per provider

## 2019-06-10 NOTE — Telephone Encounter (Signed)
-----  Message from Fayrene Helper, MD sent at 06/10/2019  3:10 PM EDT ----- Noted, pls order and mail fasting lipid, cmp and eGFr to be drawn 1 week before her November appt

## 2019-08-15 ENCOUNTER — Other Ambulatory Visit: Payer: Self-pay | Admitting: Family Medicine

## 2019-08-25 DIAGNOSIS — H2512 Age-related nuclear cataract, left eye: Secondary | ICD-10-CM | POA: Diagnosis not present

## 2019-10-01 DIAGNOSIS — E785 Hyperlipidemia, unspecified: Secondary | ICD-10-CM | POA: Diagnosis not present

## 2019-10-01 DIAGNOSIS — I10 Essential (primary) hypertension: Secondary | ICD-10-CM | POA: Diagnosis not present

## 2019-10-02 LAB — COMPLETE METABOLIC PANEL WITH GFR
AG Ratio: 1.8 (calc) (ref 1.0–2.5)
ALT: 19 U/L (ref 6–29)
AST: 25 U/L (ref 10–35)
Albumin: 4.3 g/dL (ref 3.6–5.1)
Alkaline phosphatase (APISO): 100 U/L (ref 37–153)
BUN/Creatinine Ratio: 17 (calc) (ref 6–22)
BUN: 16 mg/dL (ref 7–25)
CO2: 31 mmol/L (ref 20–32)
Calcium: 9.8 mg/dL (ref 8.6–10.4)
Chloride: 102 mmol/L (ref 98–110)
Creat: 0.96 mg/dL — ABNORMAL HIGH (ref 0.60–0.88)
GFR, Est African American: 63 mL/min/{1.73_m2} (ref >=60)
GFR, Est Non African American: 55 mL/min/{1.73_m2} — ABNORMAL LOW (ref >=60)
Globulin: 2.4 g/dL (calc) (ref 1.9–3.7)
Glucose, Bld: 98 mg/dL (ref 65–99)
Potassium: 4.2 mmol/L (ref 3.5–5.3)
Sodium: 139 mmol/L (ref 135–146)
Total Bilirubin: 0.6 mg/dL (ref 0.2–1.2)
Total Protein: 6.7 g/dL (ref 6.1–8.1)

## 2019-10-02 LAB — LIPID PANEL
Cholesterol: 240 mg/dL — ABNORMAL HIGH (ref <200)
HDL: 96 mg/dL (ref >=50)
LDL Cholesterol (Calc): 122 mg/dL (calc) — ABNORMAL HIGH
Non-HDL Cholesterol (Calc): 144 mg/dL (calc) — ABNORMAL HIGH (ref <130)
Total CHOL/HDL Ratio: 2.5 (calc) (ref <5.0)
Triglycerides: 116 mg/dL (ref <150)

## 2019-10-05 ENCOUNTER — Encounter (INDEPENDENT_AMBULATORY_CARE_PROVIDER_SITE_OTHER): Payer: Self-pay

## 2019-10-05 ENCOUNTER — Encounter: Payer: Self-pay | Admitting: Family Medicine

## 2019-10-05 ENCOUNTER — Other Ambulatory Visit: Payer: Self-pay

## 2019-10-05 ENCOUNTER — Ambulatory Visit (INDEPENDENT_AMBULATORY_CARE_PROVIDER_SITE_OTHER): Payer: PPO | Admitting: Family Medicine

## 2019-10-05 ENCOUNTER — Other Ambulatory Visit: Payer: Self-pay | Admitting: Family Medicine

## 2019-10-05 VITALS — BP 150/90 | HR 82 | Temp 98.0°F | Ht 65.0 in | Wt 128.0 lb

## 2019-10-05 DIAGNOSIS — I10 Essential (primary) hypertension: Secondary | ICD-10-CM | POA: Diagnosis not present

## 2019-10-05 DIAGNOSIS — R142 Eructation: Secondary | ICD-10-CM | POA: Insufficient documentation

## 2019-10-05 DIAGNOSIS — M25561 Pain in right knee: Secondary | ICD-10-CM | POA: Diagnosis not present

## 2019-10-05 DIAGNOSIS — Z7189 Other specified counseling: Secondary | ICD-10-CM | POA: Diagnosis not present

## 2019-10-05 DIAGNOSIS — R1013 Epigastric pain: Secondary | ICD-10-CM | POA: Diagnosis not present

## 2019-10-05 MED ORDER — KETOROLAC TROMETHAMINE 60 MG/2ML IM SOLN
30.0000 mg | Freq: Once | INTRAMUSCULAR | Status: AC
  Administered 2019-10-05: 30 mg via INTRAMUSCULAR

## 2019-10-05 MED ORDER — SIMETHICONE 80 MG PO CHEW: 80.0000 mg | CHEWABLE_TABLET | Freq: Four times a day (QID) | ORAL | 3 refills | Status: DC | PRN

## 2019-10-05 MED ORDER — OMEPRAZOLE 40 MG PO CPDR: DELAYED_RELEASE_CAPSULE | ORAL | 1 refills | Status: DC

## 2019-10-05 MED ORDER — METHYLPREDNISOLONE ACETATE 80 MG/ML IJ SUSP
40.0000 mg | Freq: Once | INTRAMUSCULAR | Status: AC
  Administered 2019-10-05: 40 mg via INTRAMUSCULAR

## 2019-10-05 NOTE — Assessment & Plan Note (Signed)
Elevated at visit , pt in pain and sleep deprived , will review in next week

## 2019-10-05 NOTE — Progress Notes (Signed)
   Kelsey Nelson     MRN: [id number]      DOB: 1934/09/29   HPI Kelsey Nelson is here for follow up and re-evaluation of chronic medical conditions, medication management and review of any available recent lab and radiology data.  Preventive health is updated, specifically  Cancer screening and Immunization.   Questions or concerns regarding consultations or procedures which the PT has had in the interim are  addressed. The PT denies any adverse reactions to current medications since the last visit.  2 week h/o right kbnee pain keeps her awake at night, had 3 hours last night Excess belching which a sh has had in the past , record review shows she was treated with PPI and also with remeron  ROS Denies recent fever or chills. Denies sinus pressure, nasal congestion, ear pain or sore throat. Denies chest congestion, productive cough or wheezing. Denies chest pains, palpitations and leg swelling   Denies dysuria, frequency, hesitancy or incontinence. . Denies headaches, seizures, numbness, or tingling. Denies depression, anxiety  Denies skin break down or rash.   PE  BP (!) 150/90   Pulse 82   Temp 98 F (36.7 C) (Temporal)   Wt 128 lb (58.1 kg)   SpO2 97%   BMI 20.98 kg/m   Patient alert and oriented and in no cardiopulmonary distress.  HEENT: No facial asymmetry, EOMI,     Neck supple .  Chest: Clear to auscultation bilaterally.  CVS: S1, S2 no murmurs, no S3.Regular rate.  ABD:  non tender.   Ext: No edema  MS: Adequate ROM spine, shoulders, hips and markedly reduced in right  knees.  Skin: Intact, no ulcerations or rash noted.  Psych: Good eye contact, normal affect. Memory intact not anxious or depressed appearing.  CNS: CN 2-12 intact, power,  normal throughout.no focal deficits noted.   Assessment & Plan  Anterior knee pain, right 2 week history , rated 8 to 10, keeps pt awake, needs ortho eval asal. Toradol 30 mg , Depo medrol 40 mg iM, tylenol es one twice  daily   Belching symptom Record review shows PPI and Remeron use =d in the past, will start with PPI in addition to OTC GAS X , WHICH I RECOMMENDED, has GI appt for December  Essential hypertension Elevated at visit , pt in pain and sleep deprived , will review in next week

## 2019-10-05 NOTE — Assessment & Plan Note (Signed)
Record review shows PPI and Remeron use =d in the past, will start with PPI in addition to OTC GAS X , WHICH I RECOMMENDED, has GI appt for December

## 2019-10-05 NOTE — Addendum Note (Signed)
Addended by: Eual Fines on: 10/05/2019 02:01 PM   Modules accepted: Orders

## 2019-10-05 NOTE — Patient Instructions (Signed)
F/U as before, please call if you need me sooner  Toradol and depo medrol are prescribed for knee pan, and tylenol ES is given, take one two times daily for 5 days  You are referred urgently to Orthopedics for further evaluation of this problem, we will call with appt information  For excess gas, simethicone  ( gas X) which is over the counter is recommended, I will contact you with any addditional medication which worked for you in the past for this problem

## 2019-10-05 NOTE — Assessment & Plan Note (Addendum)
2 week history , rated 8 to 10, keeps pt awake, needs ortho eval asal. Toradol 30 mg , Depo medrol 40 mg iM, tylenol es one twice daily

## 2019-10-07 ENCOUNTER — Other Ambulatory Visit: Payer: Self-pay

## 2019-10-07 ENCOUNTER — Encounter: Payer: Self-pay | Admitting: Orthopaedic Surgery

## 2019-10-07 ENCOUNTER — Ambulatory Visit: Payer: PPO

## 2019-10-07 ENCOUNTER — Ambulatory Visit (INDEPENDENT_AMBULATORY_CARE_PROVIDER_SITE_OTHER): Payer: PPO | Admitting: Orthopaedic Surgery

## 2019-10-07 VITALS — BP 155/92 | HR 87 | Temp 94.1°F | Ht 65.5 in | Wt 127.4 lb

## 2019-10-07 DIAGNOSIS — G8929 Other chronic pain: Secondary | ICD-10-CM | POA: Diagnosis not present

## 2019-10-07 DIAGNOSIS — M25561 Pain in right knee: Secondary | ICD-10-CM

## 2019-10-07 NOTE — Progress Notes (Signed)
Subjective:    Patient ID: Kelsey Nelson, female    DOB: 1934/10/18, 85 y.o.   MRN: [id number]  HPI She has pain of the right knee.  It has been present several months getting worse.  She saw Dr. Moshe Cipro recently and was referred here.  She has tried Advil, ice, heat.  She is not supposed to take NSAIDs as she has significant reflux  She has swelling and popping but no giving way She has no redness.  She has no trauma.     Review of Systems  Constitutional: Positive for activity change.  Musculoskeletal: Positive for arthralgias, gait problem and joint swelling.  All other systems reviewed and are negative.  For Review of Systems, all other systems reviewed and are negative.  The following is a summary of the past history medically, past history surgically, known current medicines, social history and family history.  This information is gathered electronically by the computer from prior information and documentation.  I review this each visit and have found including this information at this point in the chart is beneficial and informative.   Past Medical History:  Diagnosis Date  . Blindness of right eye   . BMI between 19-24,adult 2010 145 LBS  . Diverticula of small intestine Dec 2010  . Diverticulosis    [place]  . Functional constipation 2010  . GERD (gastroesophageal reflux disease)   . HTN (hypertension)   . Hyperlipemia   . Right eye trauma 1949   Loss of vision resulted at age 32  . Schatzki's ring Dec 2010    Past Surgical History:  Procedure Laterality Date  . ABDOMINAL HYSTERECTOMY    . BREAST REDUCTION SURGERY  1992 BIL  . COLONOSCOPY  DEC 2010   SIMPLE ADENOMAS (6-8), [place] TICS, SML IH  . COLONOSCOPY N/A 12/08/2013   Procedure: COLONOSCOPY;  Surgeon: Rogene Houston, MD;  Location: [facility];  Service: Endoscopy;  Laterality: N/A;  225  . EVISCERATION Right 05/08/2015   Procedure: EVISCERATION WITH IMPLANT RIGHT EYE;  Surgeon: Williams Che, MD;  Location: [facility]  ORS;  Service: Ophthalmology;  Laterality: Right;  . EYE SURGERY  RIGHT 2005  . EYE SURGERY  right 2016   removed and false eye placed in 08/2015  . TOTAL ABDOMINAL HYSTERECTOMY W/ BILATERAL SALPINGOOPHORECTOMY  1985 FIBROIDS  . UPPER GASTROINTESTINAL ENDOSCOPY  DEC 2010   Bx-MILD GASTRITIS/DUODENITIS, DUODENAL DIVERTICULA, SCHATZKI'S RING    Current Outpatient Medications on File Prior to Visit  Medication Sig Dispense Refill  . Multiple Vitamin (MULTI-VITAMIN DAILY PO) Take by mouth daily. doTerra Women -  Bone Nutireint    . omeprazole (PRILOSEC) 20 MG capsule TAKE 1 CAPSULE BY MOUTH DAILY AS NEEDED 90 capsule 1  . omeprazole (PRILOSEC) 40 MG capsule TAKE 1 CAPSULE BY MOUTH EVERY DAY FOR 1 WEEK THEN TAKE 1 CAPSULE BY MOUTH DAILY AS NEEDED 90 capsule 0  . pravastatin (PRAVACHOL) 20 MG tablet TAKE 1 TABLET(20 MG) BY MOUTH EVERY EVENING 90 tablet 1  . ramipril (ALTACE) 5 MG capsule TAKE 1 CAPSULE(5 MG) BY MOUTH DAILY 90 capsule 1  . simethicone (GAS-X) 80 MG chewable tablet Chew 1 tablet (80 mg total) by mouth every 6 (six) hours as needed for flatulence. 30 tablet 3   No current facility-administered medications on file prior to visit.     Social History   Socioeconomic History  . Marital status: Married    Spouse name: [name]   . Number of children: 3  .  Years of education: 9  . Highest education level: 12th grade  Occupational History  . Occupation: retired   Scientific laboratory technician  . Financial resource strain: Not hard at all  . Food insecurity    Worry: Never true    Inability: Never true  . Transportation needs    Medical: No    Non-medical: No  Tobacco Use  . Smoking status: Never Smoker  . Smokeless tobacco: Never Used  Substance and Sexual Activity  . Alcohol use: No    Alcohol/week: 0.0 standard drinks  . Drug use: No  . Sexual activity: Not Currently  Lifestyle  . Physical activity    Days per week: 5 days    Minutes per session: 60 min  . Stress: Not at all   Relationships  . Social Herbalist on phone: Twice a week    Gets together: Once a week    Attends religious service: More than 4 times per year    Active member of club or organization: Yes    Attends meetings of clubs or organizations: More than 4 times per year    Relationship status: Married  . Intimate partner violence    Fear of current or ex partner: No    Emotionally abused: No    Physically abused: No    Forced sexual activity: No  Other Topics Concern  . Not on file  Social History Narrative   MARRIED TO DAVID Williams. 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 Pancreatic cancer Sister   . Colon cancer Paternal Aunt   . Colon polyps Neg Hx     BP (!) 155/92   Pulse 87   Temp (!) 94.1 F (34.5 C)   Ht 5' 5.5" (1.664 m)   Wt 127 lb 6.4 oz (57.8 kg)   BMI 20.88 kg/m   Body mass index is 20.88 kg/m.      Objective:   Physical Exam Vitals signs reviewed.  Constitutional:      Appearance: She is well-developed.  HENT:     Head: Normocephalic and atraumatic.  Eyes:     Conjunctiva/sclera: Conjunctivae normal.     Pupils: Pupils are equal, round, and reactive to light.     Comments: Blindness right eye  Neck:     Musculoskeletal: Normal range of motion and neck supple.  Cardiovascular:     Rate and Rhythm: Normal rate and regular rhythm.  Pulmonary:     Effort: Pulmonary effort is normal.  Abdominal:     Palpations: Abdomen is soft.  Musculoskeletal:     Right knee: She exhibits swelling and effusion. Tenderness found. Medial joint line tenderness noted.       Legs:  Skin:    General: Skin is warm and dry.  Neurological:     Mental Status: She is alert and oriented to person, place, and time.     Cranial Nerves: No cranial nerve deficit.     Motor: No abnormal muscle tone.     Coordination: Coordination normal.     Deep Tendon Reflexes: Reflexes are normal and symmetric. Reflexes normal.   Psychiatric:        Behavior: Behavior normal.        Thought Content: Thought content normal.        Judgment: Judgment normal.     X-rays were done of the right knee, reported separately.      Assessment & Plan:  Encounter Diagnosis  Name Primary?  . Chronic pain of right knee Yes   PROCEDURE NOTE:  The patient requests injections of the right knee , verbal consent was obtained.  The right knee was prepped appropriately after time out was performed.   Sterile technique was observed and injection of 1 cc of Depo-Medrol 40 mg with several cc's of plain xylocaine. Anesthesia was provided by ethyl chloride and a 20-gauge needle was used to inject the knee area. The injection was tolerated well.  A band aid dressing was applied.  The patient was advised to apply ice later today and tomorrow to the injection sight as needed.  I cannot give NSAIDs.  Return in one month.  Call if any problem.  Precautions discussed.   Electronically Signed Sanjuana Kava, MD 10/15/202012:30 PM

## 2019-10-11 ENCOUNTER — Other Ambulatory Visit: Payer: Self-pay

## 2019-10-11 ENCOUNTER — Encounter: Payer: Self-pay | Admitting: Family Medicine

## 2019-10-11 ENCOUNTER — Ambulatory Visit (INDEPENDENT_AMBULATORY_CARE_PROVIDER_SITE_OTHER): Payer: PPO | Admitting: Family Medicine

## 2019-10-11 VITALS — BP 122/80 | HR 66 | Temp 97.5°F | Ht 65.0 in | Wt 129.0 lb

## 2019-10-11 DIAGNOSIS — Z1231 Encounter for screening mammogram for malignant neoplasm of breast: Secondary | ICD-10-CM

## 2019-10-11 DIAGNOSIS — K219 Gastro-esophageal reflux disease without esophagitis: Secondary | ICD-10-CM

## 2019-10-11 DIAGNOSIS — R142 Eructation: Secondary | ICD-10-CM | POA: Diagnosis not present

## 2019-10-11 DIAGNOSIS — Z0000 Encounter for general adult medical examination without abnormal findings: Secondary | ICD-10-CM

## 2019-10-11 DIAGNOSIS — Z23 Encounter for immunization: Secondary | ICD-10-CM

## 2019-10-11 DIAGNOSIS — I10 Essential (primary) hypertension: Secondary | ICD-10-CM

## 2019-10-11 DIAGNOSIS — E785 Hyperlipidemia, unspecified: Secondary | ICD-10-CM

## 2019-10-11 MED ORDER — PRAVASTATIN SODIUM 20 MG PO TABS: 20.0000 mg | ORAL_TABLET | Freq: Every day | ORAL | 3 refills | Status: DC

## 2019-10-11 MED ORDER — MIRTAZAPINE 7.5 MG PO TABS: 7.5000 mg | ORAL_TABLET | Freq: Every day | ORAL | 2 refills | Status: DC

## 2019-10-11 NOTE — Assessment & Plan Note (Signed)
Annual exam as documented. . Immunization and cancer screening needs are specifically addressed at this visit.  

## 2019-10-11 NOTE — Patient Instructions (Addendum)
Keep Wellness appointment in January please  Please schedule January mammogram at checkout  Fasting lipid, cmp and EGFR  [facility] March 2021, 1 weekbefore appointment  F/U with MD in March, 2021, call if you need me sooner   Flu vaccine today  Additional medication Remeron, which you have use d in the past is added to omeprazole  Call in 2 to 3 weeks if no better, I will arrange gall bladder studies  You are referred to Dr Laural Golden  Please reduce fried and fatty foods  Thanks for choosing [facility], we consider it a privelige to serve you.

## 2019-10-11 NOTE — Progress Notes (Signed)
    Kelsey Nelson     MRN: [id number]      DOB: 1934-03-22  HPI: Patient is in for annual physical exam. Reports continuous and ongoing belching and dyspepsia.  Knee is improved, got intra articular injection by Ortho Recent labs,  are reviewed. Immunization is reviewed , and  updated   PE: BP 122/80   Pulse 66   Temp (!) 97.5 F (36.4 C)   Ht 5\' 5"  (1.651 m)   Wt 129 lb (58.5 kg)   SpO2 97%   BMI 21.47 kg/m   Pleasant  female, alert and oriented x 3, in no cardio-pulmonary distress. Afebrile. HEENT No facial trauma or asymetry. Sinuses non tender.  Extra occullar muscles intact.. External ears normal, . Neck: supple, no adenopathy,JVD or thyromegaly.No bruits.  Chest: Clear to ascultation bilaterally.No crackles or wheezes. Non tender to palpation  Breast: Not examined, denies mass and mammogram is up to date.  Cardiovascular system; Heart sounds normal,  S1 and  S2 ,no S3.  No murmur, or thrill. Apical beat not displaced Peripheral pulses normal.  Abdomen: Soft, non tender, No guarding, tenderness or rebound.      Musculoskeletal exam: Full ROM of spine, hips , shoulders and knees. No deformity ,swelling or crepitus noted. No muscle wasting or atrophy.   Neurologic: Cranial nerves 2 to 12 intact. Power, tone ,sensation and reflexes normal throughout. No disturbance in gait. No tremor.  Skin: Intact, no ulceration, erythema , scaling or rash noted. Pigmentation normal throughout  Psych; Normal mood and affect. Judgement and concentration normal   Assessment & Plan:  Annual physical exam Annual exam as documented.  Immunization and cancer screening needs are specifically addressed at this visit.   Belching symptom Excessive belching  with bending of right knee and walking x 2 weeks, little to no response to omeprazole, will add Remeron and refer to GI. She is to call in 2 weeks if no better for rept gall bladder testing, last was in  2012  GERD Recurrent belching and dyspesia. Start PPI and remeron which havebeen beneficial in the past. Refer to GI who has treated her in the past also

## 2019-10-11 NOTE — Assessment & Plan Note (Signed)
Excessive belching  with bending of right knee and walking x 2 weeks, little to no response to omeprazole, will add Remeron and refer to GI. She is to call in 2 weeks if no better for rept gall bladder testing, last was in 2012

## 2019-10-14 ENCOUNTER — Encounter: Payer: Self-pay | Admitting: Family Medicine

## 2019-10-14 NOTE — Assessment & Plan Note (Signed)
Recurrent belching and dyspesia. Start PPI and remeron which havebeen beneficial in the past. Refer to GI who has treated her in the past also

## 2019-10-25 DIAGNOSIS — M47816 Spondylosis without myelopathy or radiculopathy, lumbar region: Secondary | ICD-10-CM | POA: Diagnosis not present

## 2019-10-25 DIAGNOSIS — M9903 Segmental and somatic dysfunction of lumbar region: Secondary | ICD-10-CM | POA: Diagnosis not present

## 2019-10-28 ENCOUNTER — Encounter: Payer: PPO | Admitting: Family Medicine

## 2019-11-02 DIAGNOSIS — M9903 Segmental and somatic dysfunction of lumbar region: Secondary | ICD-10-CM | POA: Diagnosis not present

## 2019-11-02 DIAGNOSIS — M47816 Spondylosis without myelopathy or radiculopathy, lumbar region: Secondary | ICD-10-CM | POA: Diagnosis not present

## 2019-11-04 ENCOUNTER — Ambulatory Visit: Payer: PPO | Admitting: Orthopaedic Surgery

## 2019-11-25 ENCOUNTER — Encounter (INDEPENDENT_AMBULATORY_CARE_PROVIDER_SITE_OTHER): Payer: Self-pay | Admitting: Gastroenterology

## 2020-01-06 ENCOUNTER — Other Ambulatory Visit: Payer: Self-pay

## 2020-01-06 ENCOUNTER — Encounter (INDEPENDENT_AMBULATORY_CARE_PROVIDER_SITE_OTHER): Payer: Self-pay | Admitting: Gastroenterology

## 2020-01-06 ENCOUNTER — Ambulatory Visit (INDEPENDENT_AMBULATORY_CARE_PROVIDER_SITE_OTHER): Payer: PPO | Admitting: Gastroenterology

## 2020-01-06 VITALS — BP 135/82 | HR 71 | Temp 97.4°F | Ht 65.5 in | Wt 130.3 lb

## 2020-01-06 DIAGNOSIS — K219 Gastro-esophageal reflux disease without esophagitis: Secondary | ICD-10-CM | POA: Diagnosis not present

## 2020-01-06 DIAGNOSIS — R142 Eructation: Secondary | ICD-10-CM

## 2020-01-06 MED ORDER — PANTOPRAZOLE SODIUM 40 MG PO TBEC: 40.0000 mg | DELAYED_RELEASE_TABLET | Freq: Every day | ORAL | 3 refills | Status: DC

## 2020-01-06 NOTE — Patient Instructions (Signed)
We are starting pantoprazole - take 30 min before breakfast. This should help belching. Please call us if symptoms worsen.  GERD instructions: -Please avoid lying flat within 2 to 3 hours of eating, this will make reflux symptoms worse. -Avoid spicy greasy foods as well as caffeine, coffee, sodas-these food/drinks can worsen heartburn and reflux. -Avoid NSAID products (ibuprofen, aspirin, Advil, Aleve, Goody's, BCs, Alka-Seltzer) - if needing these occasionally please try to take with meal or snack to decrease stomach irritation. -If taking medication for reflux such as prilosec, nexium, aciphex, dexilant, prevacid - take 20-30 minutes before a meal for maximum effectiveness.

## 2020-01-06 NOTE — Progress Notes (Signed)
Patient profile: Kelsey Nelson is a 86 y.o. female seen for evaluation of belching.  Last seen in clinic 2019 for viral gastroenteritis.   History of Present Illness: Kelsey Nelson is seen today for evaluation of burping-she began noticing it more so this fall.  She correlates the onset when she developed specifically right knee pain, she denies using any NSAIDs etc. for the knee pain.  She denies any new medications at onset.  She does not feel any classic heartburn or acid regurgitation.  Feels she is belching more throughout the day.  She has a good appetite and denies nausea vomiting.  She has no dysphagia.  Reports she has been on magnesium supplement for "bones" for several years.  She has omeprazole on her medicine list but cannot remember the last time she took this-feels its been at least a while.   She is usually having bowel move daily she denies any lower abdominal pain, constipation, diarrhea, etc.  She does not chew gum or have excessive sodas.  Wt Readings from Last 3 Encounters:  01/06/20 130 lb 4.8 oz (59.1 kg)  10/11/19 129 lb (58.5 kg)  10/07/19 127 lb 6.4 oz (57.8 kg)     Last Colonoscopy: 2014-Prep excellent. Small polyp ablated via cold biopsy from proximal transverse colon.Multiple diverticula noted at sigmoid colon. Normal rectal mucosa. Small hemorrhoids below the dentate line. Path with tubular adenoma-next colonoscopy recommended on as-needed basis given age   Last Endoscopy: 2012-gastritis   Past Medical History:  Past Medical History:  Diagnosis Date  . Blindness of right eye   . BMI between 19-24,adult 2010 145 LBS  . Diverticula of small intestine Dec 2010  . Diverticulosis    [place]  . Functional constipation 2010  . GERD (gastroesophageal reflux disease)   . HTN (hypertension)   . Hyperlipemia   . Right eye trauma 1949   Loss of vision resulted at age 33  . Schatzki's ring Dec 2010    Problem List: Patient Active Problem List   Diagnosis  Date Noted  . Anterior knee pain, right 10/05/2019  . Belching symptom 10/05/2019  . Allergic rhinitis 11/08/2017  . Annual physical exam 03/23/2016  . Skin lesions, generalized 06/19/2015  . Tubular adenoma 11/09/2013  . Family hx of colon cancer 11/09/2013  . Urinary urgency 10/31/2013  . Pancreatic cyst 02/05/2013  . Nocturia 12/02/2011  . Renal cyst 05/27/2011  . GERD 11/20/2009  . Hyperlipidemia LDL goal <100 03/31/2008  . BLINDNESS, RIGHT EYE 03/31/2008  . Essential hypertension 03/31/2008  . DIVERTICULOSIS, SIGMOID COLON 03/31/2008  . Osteoporosis 03/31/2008    Past Surgical History: Past Surgical History:  Procedure Laterality Date  . ABDOMINAL HYSTERECTOMY    . BREAST REDUCTION SURGERY  1992 BIL  . COLONOSCOPY  DEC 2010   SIMPLE ADENOMAS (6-8), [place] TICS, SML IH  . COLONOSCOPY N/A 12/08/2013   Procedure: COLONOSCOPY;  Surgeon: Rogene Houston, MD;  Location: [facility];  Service: Endoscopy;  Laterality: N/A;  225  . EVISCERATION Right 05/08/2015   Procedure: EVISCERATION WITH IMPLANT RIGHT EYE;  Surgeon: Williams Che, MD;  Location: [facility] ORS;  Service: Ophthalmology;  Laterality: Right;  . EYE SURGERY  RIGHT 2005  . EYE SURGERY  right 2016   removed and false eye placed in 08/2015  . TOTAL ABDOMINAL HYSTERECTOMY W/ BILATERAL SALPINGOOPHORECTOMY  1985 FIBROIDS  . UPPER GASTROINTESTINAL ENDOSCOPY  DEC 2010   Bx-MILD GASTRITIS/DUODENITIS, DUODENAL DIVERTICULA, SCHATZKI'S RING    Allergies: Allergies  Allergen Reactions  . Codeine   . Fosamax [Alendronate Sodium]     heartburn      Home Medications:  Current Outpatient Medications:  .  Cholecalciferol (VITAMIN D3 PO), Take 25 mcg by mouth., Disp: , Rfl:  .  CRANBERRY PO, Take by mouth daily., Disp: , Rfl:  .  Magnesium 400 MG CAPS, Take 400 mg by mouth., Disp: , Rfl:  .  Multiple Vitamin (MULTI-VITAMIN DAILY PO), Take by mouth daily. doTerra Women -  Bone Nutireint, Disp: , Rfl:  .  OVER THE COUNTER  MEDICATION, daily. Mega Q Plus Max - Dr. Talmage Coin 's - Daily, Disp: , Rfl:  .  Probiotic Product (PROBIOTIC PO), Take by mouth daily., Disp: , Rfl:  .  ramipril (ALTACE) 5 MG capsule, TAKE 1 CAPSULE(5 MG) BY MOUTH DAILY, Disp: 90 capsule, Rfl: 1 .  mirtazapine (REMERON) 7.5 MG tablet, Take 1 tablet (7.5 mg total) by mouth at bedtime. (Patient not taking: Reported on 01/06/2020), Disp: 30 tablet, Rfl: 2 .  omeprazole (PRILOSEC) 40 MG capsule, TAKE 1 CAPSULE BY MOUTH EVERY DAY FOR 1 WEEK THEN TAKE 1 CAPSULE BY MOUTH DAILY AS NEEDED (Patient not taking: Reported on 01/06/2020), Disp: 90 capsule, Rfl: 0 .  pantoprazole (PROTONIX) 40 MG tablet, Take 1 tablet (40 mg total) by mouth daily. Take 30 min before breakfast, Disp: 30 tablet, Rfl: 3 .  pravastatin (PRAVACHOL) 20 MG tablet, Take 1 tablet (20 mg total) by mouth daily. (Patient not taking: Reported on 01/06/2020), Disp: 90 tablet, Rfl: 3   Family History: family history includes Colon cancer in her paternal aunt; Pancreatic cancer in her sister.    Social History:   reports that she has never smoked. She has never used smokeless tobacco. She reports that she does not drink alcohol or use drugs.   Review of Systems: Constitutional: Denies weight loss/weight gain  Eyes: No changes in vision. ENT: No oral lesions, sore throat.  GI: see HPI.  Heme/Lymph: No easy bruising.  CV: No chest pain.  GU: No hematuria.  Integumentary: No rashes.  Neuro: No headaches.  Psych: No depression/anxiety.  Endocrine: No heat/cold intolerance.  Allergic/Immunologic: No urticaria.  Resp: No cough, SOB.  Musculoskeletal: No joint swelling.    Physical Examination: BP 135/82 (BP Location: Right Arm, Patient Position: Sitting, Cuff Size: Large)   Pulse 71   Temp (!) 97.4 F (36.3 C) (Temporal)   Ht 5' 5.5" (1.664 m)   Wt 130 lb 4.8 oz (59.1 kg)   BMI 21.35 kg/m  Gen: NAD, alert and oriented x 4 HEENT: PEERLA, EOMI, Neck: supple, no JVD Chest: CTA  bilaterally, no wheezes, crackles, or other adventitious sounds CV: RRR, no m/g/c/r Abd: soft, NT, ND, +BS in all four quadrants; no HSM, guarding, ridigity, or rebound tenderness Ext: no edema, well perfused with 2+ pulses, Skin: no rash or lesions noted on observed skin Lymph: no noted LAD  Data Reviewed:   Labs 09/2019--CMP normal, CBC normal.  TSH normal  Assessment/Plan: Ms. Towell is a 86 y.o. female seen for belching.  Suspect she has uncontrolled GERD.  We reviewed diet modifications.  She has been on a PPI in the past but she is not currently on PPI or any other reflux therapy.  We will try a 4-week course of Protonix 40 mg once a day.  If she does not improve we discussed proceeding with an endoscopy and scheduling at her follow-up office visit in 4 weeks.  If she is doing  well would consider switching to Pepcid long-term or dose reducing to 20 mg PPI daily.  4-week follow-up   Shantell was seen today for new patient (initial visit).  Diagnoses and all orders for this visit:  Gastroesophageal reflux disease, unspecified whether esophagitis present  Belching  Other orders -     pantoprazole (PROTONIX) 40 MG tablet; Take 1 tablet (40 mg total) by mouth daily. Take 30 min before breakfast       I personally performed the service, non-incident to. (WP)  Laurine Blazer, [facility]

## 2020-01-07 ENCOUNTER — Ambulatory Visit: Payer: PPO

## 2020-01-13 ENCOUNTER — Other Ambulatory Visit: Payer: Self-pay

## 2020-01-13 ENCOUNTER — Encounter: Payer: Self-pay | Admitting: Family Medicine

## 2020-01-13 ENCOUNTER — Ambulatory Visit (INDEPENDENT_AMBULATORY_CARE_PROVIDER_SITE_OTHER): Payer: PPO | Admitting: Family Medicine

## 2020-01-13 VITALS — BP 135/82 | HR 71 | Resp 15 | Ht 65.5 in | Wt 130.0 lb

## 2020-01-13 DIAGNOSIS — Z0000 Encounter for general adult medical examination without abnormal findings: Secondary | ICD-10-CM | POA: Diagnosis not present

## 2020-01-13 NOTE — Progress Notes (Signed)
Subjective:   Kelsey Nelson is a 86 y.o. female who presents for Medicare Annual (Subsequent) preventive examination.   Location of Patient: Home Location of Provider: Telehealth Consent was obtain for visit to be over via telehealth. I verified that I am speaking with the correct person using two identifiers.   Review of Systems:    Cardiac Risk Factors include: advanced age (>66men, >16 women);dyslipidemia;hypertension     Objective:     Vitals: BP 135/82   Pulse 71   Resp 15   Ht 5' 5.5" (1.664 m)   Wt 130 lb (59 kg)   BMI 21.30 kg/m   Body mass index is 21.3 kg/m.  Advanced Directives 05/05/2019 01/06/2019 12/04/2018 01/05/2018 07/22/2015 05/04/2015 12/08/2013  Does Patient Have a Medical Advance Directive? Yes Yes No;Yes Yes Yes No Patient has advance directive, copy not in chart  Type of Advance Directive [place];Living will Healthcare Power of [place];Living will - - - [place];Living will  Does patient want to make changes to medical advance directive? No - Patient declined No - Patient declined - - - - -  Copy of [place] in Chart? No - copy requested Yes - validated most recent copy scanned in chart (See row information) - - Yes - Copy requested from family  Would patient like information on creating a medical advance directive? - - - - - No - patient declined information -  Pre-existing out of facility DNR order (yellow form or pink MOST form) - - - - - - No    Tobacco Social History   Tobacco Use  Smoking Status Never Smoker  Smokeless Tobacco Never Used     Counseling given: Yes   Clinical Intake:  Pre-visit preparation completed: Yes  Pain : 0-10 Pain Score: 3  Pain Type: Acute pain Pain Location: Foot Pain Orientation: Right Pain Onset: Today Pain Relieving Factors: walking around because its gas pains, baking soda Effect of Pain on Daily Activities:  no  Pain Relieving Factors: walking around because its gas pains, baking soda  BMI - recorded: 21.3 Nutritional Status: BMI of 19-24  Normal Nutritional Risks: None Diabetes: No  How often do you need to have someone help you when you read instructions, pamphlets, or other written materials from your doctor or pharmacy?: 1 - Never What is the last grade level you completed in school?: 12  Interpreter Needed?: No     Past Medical History:  Diagnosis Date  . Blindness of right eye   . BMI between 19-24,adult 2010 145 LBS  . Diverticula of small intestine Dec 2010  . Diverticulosis    [place]  . Functional constipation 2010  . GERD (gastroesophageal reflux disease)   . HTN (hypertension)   . Hyperlipemia   . Right eye trauma 1949   Loss of vision resulted at age 69  . Schatzki's ring Dec 2010   Past Surgical History:  Procedure Laterality Date  . ABDOMINAL HYSTERECTOMY    . BREAST REDUCTION SURGERY  1992 BIL  . COLONOSCOPY  DEC 2010   SIMPLE ADENOMAS (6-8), [place] TICS, SML IH  . COLONOSCOPY N/A 12/08/2013   Procedure: COLONOSCOPY;  Surgeon: Rogene Houston, MD;  Location: [facility];  Service: Endoscopy;  Laterality: N/A;  225  . EVISCERATION Right 05/08/2015   Procedure: EVISCERATION WITH IMPLANT RIGHT EYE;  Surgeon: Williams Che, MD;  Location: [facility];  Service: Ophthalmology;  Laterality: Right;  .  EYE SURGERY  RIGHT 2005  . EYE SURGERY  right 2016   removed and false eye placed in 08/2015  . TOTAL ABDOMINAL HYSTERECTOMY W/ BILATERAL SALPINGOOPHORECTOMY  1985 FIBROIDS  . UPPER GASTROINTESTINAL ENDOSCOPY  DEC 2010   Bx-MILD GASTRITIS/DUODENITIS, DUODENAL DIVERTICULA, SCHATZKI'S RING   Family History  Problem Relation Age of Onset  . Pancreatic cancer Sister   . Colon cancer Paternal Aunt   . Colon polyps Neg Hx    Social History   Socioeconomic History  . Marital status: Married    Spouse name: [name]   . Number of children: 3  . Years of education: 28  .  Highest education level: 12th grade  Occupational History  . Occupation: retired   Tobacco Use  . Smoking status: Never Smoker  . Smokeless tobacco: Never Used  Substance and Sexual Activity  . Alcohol use: No    Alcohol/week: 0.0 standard drinks  . Drug use: No  . Sexual activity: Not Currently  Other Topics Concern  . Not on file  Social History Narrative   MARRIED TO DAVID Melena. 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 Difficulty of Paying Living Expenses: Not on file  Food Insecurity:   . Worried About Charity fundraiser in the Last Year: Not on file  . Ran Out of Food in the Last Year: Not on file  Transportation Needs:   . Lack of Transportation (Medical): Not on file  . Lack of Transportation (Non-Medical): Not on file  Physical Activity:   . Days of Exercise per Week: Not on file  . Minutes of Exercise per Session: Not on file  Stress:   . Feeling of Stress : Not on file  Social Connections:   . Frequency of Communication with Friends and Family: Not on file  . Frequency of Social Gatherings with Friends and Family: Not on file  . Attends Religious Services: Not on file  . Active Member of Clubs or Organizations: Not on file  . Attends Archivist Meetings: Not on file  . Marital Status: Not on file    Outpatient Encounter Medications as of 01/13/2020  Medication Sig  . Cholecalciferol (VITAMIN D3 PO) Take 25 mcg by mouth.  . CRANBERRY PO Take by mouth daily.  . Magnesium 400 MG CAPS Take 400 mg by mouth.  . Multiple Vitamin (MULTI-VITAMIN DAILY PO) Take by mouth daily. doTerra Women -  Bone Nutireint  . OVER THE COUNTER MEDICATION daily. Mega Q Plus Max - Dr. Talmage Coin 's - Daily  . pantoprazole (PROTONIX) 40 MG tablet Take 1 tablet (40 mg total) by mouth daily. Take 30 min before breakfast  . Probiotic Product (PROBIOTIC PO) Take by mouth daily.  . ramipril (ALTACE) 5 MG capsule TAKE 1 CAPSULE(5 MG)  BY MOUTH DAILY  . mirtazapine (REMERON) 7.5 MG tablet Take 1 tablet (7.5 mg total) by mouth at bedtime. (Patient not taking: Reported on 01/06/2020)  . pravastatin (PRAVACHOL) 20 MG tablet Take 1 tablet (20 mg total) by mouth daily. (Patient not taking: Reported on 01/06/2020)  . [DISCONTINUED] omeprazole (PRILOSEC) 40 MG capsule TAKE 1 CAPSULE BY MOUTH EVERY DAY FOR 1 WEEK THEN TAKE 1 CAPSULE BY MOUTH DAILY AS NEEDED (Patient not taking: Reported on 01/06/2020)   No facility-administered encounter medications on file as of 01/13/2020.    Activities of Daily Living In your present state of health, do you have any difficulty performing the  following activities: 01/13/2020  Hearing? N  Vision? Y  Comment blind in one eye  Difficulty concentrating or making decisions? Y  Walking or climbing stairs? N  Dressing or bathing? N  Doing errands, shopping? N  Preparing Food and eating ? N  Using the Toilet? N  In the past six months, have you accidently leaked urine? N  Do you have problems with loss of bowel control? N  Managing your Medications? N  Managing your Finances? N  Housekeeping or managing your Housekeeping? N  Some recent data might be hidden    Patient Care Team: Fayrene Helper, MD as PCP - General (Family Medicine) Danie Binder, MD (Gastroenterology) Rogene Houston, MD (Gastroenterology) Virgia Land, MD as Referring Physician (Internal Medicine)    Assessment:   This is a routine wellness examination for Shambhavi.  Exercise Activities and Dietary recommendations Current Exercise Habits: Home exercise routine, Type of exercise: walking, Time (Minutes): 55, Frequency (Times/Week): 5, Weekly Exercise (Minutes/Week): 275, Intensity: Moderate, Exercise limited by: None identified  Goals    . DIET - INCREASE WATER INTAKE       Fall Risk Fall Risk  01/13/2020 10/11/2019 10/05/2019 04/27/2019 01/06/2019  Falls in the past year? 0 0 0 0 0  Number falls in past yr: 0 0 0 0  -  Injury with Fall? 0 0 0 0 -   Is the patient's home free of loose throw rugs in walkways, pet beds, electrical cords, etc?   yes      Grab bars in the bathroom? yes      Handrails on the stairs?   yes      Adequate lighting?   yes     Depression Screen PHQ 2/9 Scores 01/13/2020 10/05/2019 01/06/2019 12/02/2018  PHQ - 2 Score 3 0 0 3  PHQ- 9 Score 9 - - 11     Cognitive Function     6CIT Screen 01/13/2020 01/06/2019 01/05/2018  What Year? 0 points 0 points 0 points  What month? 0 points 0 points 0 points  What time? 0 points 0 points 0 points  Count back from 20 0 points 0 points 0 points  Months in reverse 0 points 0 points 0 points  Repeat phrase 0 points 0 points 0 points  Total Score 0 0 0    Immunization History  Administered Date(s) Administered  . Fluad Quad(high Dose 65+) 10/11/2019  . Influenza Split 09/24/2012, 10/19/2015  . Influenza Whole 12/31/2006, 11/06/2009, 10/29/2011  . Influenza, High Dose Seasonal PF 09/28/2018  . Influenza,inj,Quad PF,6+ Mos 10/26/2013, 12/05/2014, 10/25/2016  . Influenza-Unspecified 10/06/2017  . Pneumococcal Conjugate-13 07/11/2014  . Pneumococcal Polysaccharide-23 12/23/2001  . Td 08/06/2004  . Tdap 06/05/2015  . Zoster 04/16/2007    Qualifies for Shingles Vaccine? completed  Screening Tests Health Maintenance  Topic Date Due  . TETANUS/TDAP  06/04/2025  . INFLUENZA VACCINE  Completed  . DEXA SCAN  Completed  . PNA vac Low Risk Adult  Completed    Cancer Screenings: Lung: Low Dose CT Chest recommended if Age 67-80 years, 30 pack-year currently smoking OR have quit w/in 15years. Patient does not qualify. Breast:  Up to date on Mammogram? Yes   Up to date of Bone Density/Dexa? Yes Colorectal:    Additional Screenings:  Hepatitis C Screening:      Plan:       1. Encounter for Medicare annual wellness exam  I have personally reviewed and noted the following in the patient's chart:   .  Medical and social  history . Use of alcohol, tobacco or illicit drugs  . Current medications and supplements . Functional ability and status . Nutritional status . Physical activity . Advanced directives . List of other physicians . Hospitalizations, surgeries, and ER visits in previous 12 months . Vitals . Screenings to include cognitive, depression, and falls . Referrals and appointments  In addition, I have reviewed and discussed with patient certain preventive protocols, quality metrics, and best practice recommendations. A written personalized care plan for preventive services as well as general preventive health recommendations were provided to patient.     I provided 20 minutes of non-face-to-face time during this encounter.   Perlie Mayo, NP  01/13/2020

## 2020-01-13 NOTE — Patient Instructions (Signed)
Kelsey Nelson , Thank you for taking time to come for your Medicare Wellness Visit. I appreciate your ongoing commitment to your health goals. Please review the following plan we discussed and let me know if I can assist you in the future.   Please continue to practice social distancing to keep you, your family, and our community safe.  If you must go out, please wear a Mask and practice good handwashing.  Screening recommendations/referrals: Colonoscopy: up to date-only needed if symptoms occur Mammogram: up to date Bone Density: up to date Recommended yearly ophthalmology/optometry visit for glaucoma screening and checkup Recommended yearly dental visit for hygiene and checkup  Vaccinations: Influenza vaccine: up to date Pneumococcal vaccine: completed Tdap vaccine: up to date Shingles vaccine: completed  Advanced directives: completed  Conditions/risks identified: falls  Next appointment: 03/06/2020   Preventive Care 76 Years and Older, Female Preventive care refers to lifestyle choices and visits with your health care provider that can promote health and wellness. What does preventive care include?  A yearly physical exam. This is also called an annual well check.  Dental exams once or twice a year.  Routine eye exams. Ask your health care provider how often you should have your eyes checked.  Personal lifestyle choices, including:  Daily care of your teeth and gums.  Regular physical activity.  Eating a healthy diet.  Avoiding tobacco and drug use.  Limiting alcohol use.  Practicing safe sex.  Taking low-dose aspirin every day.  Taking vitamin and mineral supplements as recommended by your health care provider. What happens during an annual well check? The services and screenings done by your health care provider during your annual well check will depend on your age, overall health, lifestyle risk factors, and family history of disease. Counseling  Your health  care provider may ask you questions about your:  Alcohol use.  Tobacco use.  Drug use.  Emotional well-being.  Home and relationship well-being.  Sexual activity.  Eating habits.  History of falls.  Memory and ability to understand (cognition).  Work and work Statistician.  Reproductive health. Screening  You may have the following tests or measurements:  Height, weight, and BMI.  Blood pressure.  Lipid and cholesterol levels. These may be checked every 5 years, or more frequently if you are over 63 years old.  Skin check.  Lung cancer screening. You may have this screening every year starting at age 25 if you have a 30-pack-year history of smoking and currently smoke or have quit within the past 15 years.  Fecal occult blood test (FOBT) of the stool. You may have this test every year starting at age 63.  Flexible sigmoidoscopy or colonoscopy. You may have a sigmoidoscopy every 5 years or a colonoscopy every 10 years starting at age 24.  Hepatitis C blood test.  Hepatitis B blood test.  Sexually transmitted disease (STD) testing.  Diabetes screening. This is done by checking your blood sugar (glucose) after you have not eaten for a while (fasting). You may have this done every 1-3 years.  Bone density scan. This is done to screen for osteoporosis. You may have this done starting at age 33.  Mammogram. This may be done every 1-2 years. Talk to your health care provider about how often you should have regular mammograms. Talk with your health care provider about your test results, treatment options, and if necessary, the need for more tests. Vaccines  Your health care provider may recommend certain vaccines, such as:  Influenza vaccine. This is recommended every year.  Tetanus, diphtheria, and acellular pertussis (Tdap, Td) vaccine. You may need a Td booster every 10 years.  Zoster vaccine. You may need this after age 19.  Pneumococcal 13-valent conjugate  (PCV13) vaccine. One dose is recommended after age 33.  Pneumococcal polysaccharide (PPSV23) vaccine. One dose is recommended after age 84. Talk to your health care provider about which screenings and vaccines you need and how often you need them. This information is not intended to replace advice given to you by your health care provider. Make sure you discuss any questions you have with your health care provider. Document Released: 01/05/2016 Document Revised: 08/28/2016 Document Reviewed: 10/10/2015 Elsevier Interactive Patient Education  2017 [employer] Prevention in the Home Falls can cause injuries. They can happen to people of all ages. There are many things you can do to make your home safe and to help prevent falls. What can I do on the outside of my home?  Regularly fix the edges of walkways and driveways and fix any cracks.  Remove anything that might make you trip as you walk through a door, such as a raised step or threshold.  Trim any bushes or trees on the path to your home.  Use bright outdoor lighting.  Clear any walking paths of anything that might make someone trip, such as rocks or tools.  Regularly check to see if handrails are loose or broken. Make sure that both sides of any steps have handrails.  Any raised decks and porches should have guardrails on the edges.  Have any leaves, snow, or ice cleared regularly.  Use sand or salt on walking paths during winter.  Clean up any spills in your garage right away. This includes oil or grease spills. What can I do in the bathroom?  Use night lights.  Install grab bars by the toilet and in the tub and shower. Do not use towel bars as grab bars.  Use non-skid mats or decals in the tub or shower.  If you need to sit down in the shower, use a plastic, non-slip stool.  Keep the floor dry. Clean up any water that spills on the floor as soon as it happens.  Remove soap buildup in the tub or shower  regularly.  Attach bath mats securely with double-sided non-slip rug tape.  Do not have throw rugs and other things on the floor that can make you trip. What can I do in the bedroom?  Use night lights.  Make sure that you have a light by your bed that is easy to reach.  Do not use any sheets or blankets that are too big for your bed. They should not hang down onto the floor.  Have a firm chair that has side arms. You can use this for support while you get dressed.  Do not have throw rugs and other things on the floor that can make you trip. What can I do in the kitchen?  Clean up any spills right away.  Avoid walking on wet floors.  Keep items that you use a lot in easy-to-reach places.  If you need to reach something above you, use a strong step stool that has a grab bar.  Keep electrical cords out of the way.  Do not use floor polish or wax that makes floors slippery. If you must use wax, use non-skid floor wax.  Do not have throw rugs and other things on the floor that  can make you trip. What can I do with my stairs?  Do not leave any items on the stairs.  Make sure that there are handrails on both sides of the stairs and use them. Fix handrails that are broken or loose. Make sure that handrails are as long as the stairways.  Check any carpeting to make sure that it is firmly attached to the stairs. Fix any carpet that is loose or worn.  Avoid having throw rugs at the top or bottom of the stairs. If you do have throw rugs, attach them to the floor with carpet tape.  Make sure that you have a light switch at the top of the stairs and the bottom of the stairs. If you do not have them, ask someone to add them for you. What else can I do to help prevent falls?  Wear shoes that:  Do not have high heels.  Have rubber bottoms.  Are comfortable and fit you well.  Are closed at the toe. Do not wear sandals.  If you use a stepladder:  Make sure that it is fully  opened. Do not climb a closed stepladder.  Make sure that both sides of the stepladder are locked into place.  Ask someone to hold it for you, if possible.  Clearly mark and make sure that you can see:  Any grab bars or handrails.  First and last steps.  Where the edge of each step is.  Use tools that help you move around (mobility aids) if they are needed. These include:  Canes.  Walkers.  Scooters.  Crutches.  Turn on the lights when you go into a dark area. Replace any light bulbs as soon as they burn out.  Set up your furniture so you have a clear path. Avoid moving your furniture around.  If any of your floors are uneven, fix them.  If there are any pets around you, be aware of where they are.  Review your medicines with your doctor. Some medicines can make you feel dizzy. This can increase your chance of falling. Ask your doctor what other things that you can do to help prevent falls. This information is not intended to replace advice given to you by your health care provider. Make sure you discuss any questions you have with your health care provider. Document Released: 10/05/2009 Document Revised: 05/16/2016 Document Reviewed: 01/13/2015 Elsevier Interactive Patient Education  2017 [employer].

## 2020-01-17 ENCOUNTER — Ambulatory Visit (HOSPITAL_COMMUNITY): Payer: PPO

## 2020-01-19 DIAGNOSIS — D3610 Benign neoplasm of peripheral nerves and autonomic nervous system, unspecified: Secondary | ICD-10-CM | POA: Diagnosis not present

## 2020-01-19 DIAGNOSIS — L309 Dermatitis, unspecified: Secondary | ICD-10-CM | POA: Diagnosis not present

## 2020-01-19 DIAGNOSIS — Z23 Encounter for immunization: Secondary | ICD-10-CM | POA: Diagnosis not present

## 2020-01-19 DIAGNOSIS — L821 Other seborrheic keratosis: Secondary | ICD-10-CM | POA: Diagnosis not present

## 2020-02-03 ENCOUNTER — Encounter (INDEPENDENT_AMBULATORY_CARE_PROVIDER_SITE_OTHER): Payer: Self-pay | Admitting: Gastroenterology

## 2020-02-03 ENCOUNTER — Other Ambulatory Visit: Payer: Self-pay

## 2020-02-03 ENCOUNTER — Ambulatory Visit (INDEPENDENT_AMBULATORY_CARE_PROVIDER_SITE_OTHER): Payer: PPO | Admitting: Gastroenterology

## 2020-02-03 VITALS — BP 133/73 | HR 81 | Temp 97.4°F | Ht 65.5 in | Wt 130.7 lb

## 2020-02-03 DIAGNOSIS — K219 Gastro-esophageal reflux disease without esophagitis: Secondary | ICD-10-CM | POA: Diagnosis not present

## 2020-02-03 DIAGNOSIS — R142 Eructation: Secondary | ICD-10-CM | POA: Diagnosis not present

## 2020-02-03 NOTE — Progress Notes (Addendum)
Patient profile: Kelsey Nelson is a 86 y.o. female seen for follow up of belching.   History of Present Illness: Kelsey Nelson is seen today for for continued symptoms.  At her last visit was started on a PPI Protonix 40 mg once a day.  She reports this has not changed her symptoms at all.  Belching began 4 to 5 months ago.  She does not have any classic heartburn symptoms.  She is passing air with belching which is more of an annoyance than painful.  She relates her symptoms specifically to moving her right lower extremity.  It began when she developed right foot and heel pain. she does not feel that bending, laying flat, moving her bowels, eating, swallowing etc. relate to the belching.  Feels she is able to specifically reproduce the belching when she moves her right extremity.  Does not occur with moving any other body parts.  She denies any nausea or vomiting.  She has no dysphagia.  She has a bowel movement daily.  She she does take a stool softener every other day.  She denies any rectal bleeding or melena.  No abdominal pain.  Most days she walks 3 miles.  She feels like the belching is better on the days that she walks, she starts belching frequently at first when she walks but this improves that her walks.  The day she does not walk she feels her belching is a lot worse.  She denies any new medications when belching began  Wt Readings from Last 3 Encounters:  02/03/20 130 lb 11.2 oz (59.3 kg)  01/13/20 130 lb (59 kg)  01/06/20 130 lb 4.8 oz (59.1 kg)     Last Colonoscopy: 2014-Prep excellent. Small polyp ablated via cold biopsy from proximal transverse colon.Multiple diverticula noted at sigmoid colon. Normal rectal mucosa. Small hemorrhoids below the dentate line. Path with tubular adenoma-next colonoscopy recommended on as-needed basis given age   Last Endoscopy: 2012-gastritis   Past Medical History:  Past Medical History:  Diagnosis Date  . Blindness of right eye     . BMI between 19-24,adult 2010 145 LBS  . Diverticula of small intestine Dec 2010  . Diverticulosis    [place]  . Functional constipation 2010  . GERD (gastroesophageal reflux disease)   . HTN (hypertension)   . Hyperlipemia   . Right eye trauma 1949   Loss of vision resulted at age 44  . Schatzki's ring Dec 2010    Problem List: Patient Active Problem List   Diagnosis Date Noted  . Anterior knee pain, right 10/05/2019  . Belching symptom 10/05/2019  . Allergic rhinitis 11/08/2017  . Annual physical exam 03/23/2016  . Skin lesions, generalized 06/19/2015  . Tubular adenoma 11/09/2013  . Family hx of colon cancer 11/09/2013  . Urinary urgency 10/31/2013  . Pancreatic cyst 02/05/2013  . Nocturia 12/02/2011  . Renal cyst 05/27/2011  . GERD 11/20/2009  . Hyperlipidemia LDL goal <100 03/31/2008  . BLINDNESS, RIGHT EYE 03/31/2008  . Essential hypertension 03/31/2008  . DIVERTICULOSIS, SIGMOID COLON 03/31/2008  . Osteoporosis 03/31/2008    Past Surgical History: Past Surgical History:  Procedure Laterality Date  . ABDOMINAL HYSTERECTOMY    . BREAST REDUCTION SURGERY  1992 BIL  . COLONOSCOPY  DEC 2010   SIMPLE ADENOMAS (6-8), [place] TICS, SML IH  . COLONOSCOPY N/A 12/08/2013   Procedure: COLONOSCOPY;  Surgeon: Rogene Houston, MD;  Location: [facility];  Service: Endoscopy;  Laterality: N/A;  225  . EVISCERATION Right 05/08/2015   Procedure: EVISCERATION WITH IMPLANT RIGHT EYE;  Surgeon: Williams Che, MD;  Location: [facility];  Service: Ophthalmology;  Laterality: Right;  . EYE SURGERY  RIGHT 2005  . EYE SURGERY  right 2016   removed and false eye placed in 08/2015  . TOTAL ABDOMINAL HYSTERECTOMY W/ BILATERAL SALPINGOOPHORECTOMY  1985 FIBROIDS  . UPPER GASTROINTESTINAL ENDOSCOPY  DEC 2010   Bx-MILD GASTRITIS/DUODENITIS, DUODENAL DIVERTICULA, SCHATZKI'S RING    Allergies: Allergies  Allergen Reactions  . Codeine   . Fosamax [Alendronate Sodium]     heartburn      Home  Medications:  Current Outpatient Medications:  .  Cholecalciferol (VITAMIN D3 PO), Take 25 mcg by mouth., Disp: , Rfl:  .  Multiple Vitamin (MULTI-VITAMIN DAILY PO), Take by mouth daily. doTerra Women -  Bone Nutireint, Disp: , Rfl:  .  OVER THE COUNTER MEDICATION, daily. Mega Q Plus Max - Dr. Talmage Coin 's - Daily, Disp: , Rfl:  .  pantoprazole (PROTONIX) 40 MG tablet, Take 1 tablet (40 mg total) by mouth daily. Take 30 min before breakfast, Disp: 30 tablet, Rfl: 3 .  Probiotic Product (PROBIOTIC PO), Take by mouth daily., Disp: , Rfl:  .  ramipril (ALTACE) 5 MG capsule, TAKE 1 CAPSULE(5 MG) BY MOUTH DAILY, Disp: 90 capsule, Rfl: 1 .  Zinc 50 MG CAPS, Take by mouth daily., Disp: , Rfl:  .  CRANBERRY PO, Take by mouth daily., Disp: , Rfl:  .  Magnesium 400 MG CAPS, Take 400 mg by mouth., Disp: , Rfl:  .  mirtazapine (REMERON) 7.5 MG tablet, Take 1 tablet (7.5 mg total) by mouth at bedtime. (Patient not taking: Reported on 01/06/2020), Disp: 30 tablet, Rfl: 2 .  pravastatin (PRAVACHOL) 20 MG tablet, Take 1 tablet (20 mg total) by mouth daily. (Patient not taking: Reported on 01/06/2020), Disp: 90 tablet, Rfl: 3   Family History: family history includes Colon cancer in her paternal aunt; Pancreatic cancer in her sister.    Social History:   reports that she has never smoked. She has never used smokeless tobacco. She reports that she does not drink alcohol or use drugs.   Review of Systems: Constitutional: Denies weight loss/weight gain  Eyes: No changes in vision. ENT: No oral lesions, sore throat.  GI: see HPI.  Heme/Lymph: No easy bruising.  CV: No chest pain.  GU: No hematuria.  Integumentary: No rashes.  Neuro: No headaches.  Psych: No depression/anxiety.  Endocrine: No heat/cold intolerance.  Allergic/Immunologic: No urticaria.  Resp: No cough, SOB.  Musculoskeletal: No joint swelling.    Physical Examination: BP 133/73 (BP Location: Right Arm, Patient Position: Sitting, Cuff  Size: Large)   Pulse 81   Temp (!) 97.4 F (36.3 C) (Temporal)   Ht 5' 5.5" (1.664 m)   Wt 130 lb 11.2 oz (59.3 kg)   BMI 21.42 kg/m  Gen: NAD, alert and oriented x 4 HEENT: PEERLA, EOMI, Neck: supple, no JVD Chest: CTA bilaterally, no wheezes, crackles, or other adventitious sounds CV: RRR, no m/g/c/r Abd: soft, NT, ND, +BS in all four quadrants; no HSM, guarding, ridigity, or rebound tenderness Ext: able to reproduce belching when moves right lower extremity. Does not occur when moving LL extremity.  Skin: no rash or lesions noted on observed skin Lymph: no noted LAD  Data Reviewed:  09/2019-CMP normal. Lipid panel normal.    Assessment/Plan: Ms. Barter is a 86 y.o. female   1.  Belching-specifically relates  to moving her right lower extremity. Denies any other GI symptoms. No improvement w/ trial of PPI and chronic GERD symptoms controlled well. Case discussed in detail w/ Dr Laural Golden, recommends evaluation w/ upper GI series to evaluate structural abnormality.   I called patient after OV and discussed updated recommendations. All questions answered.     Alahia was seen today for follow-up.  Diagnoses and all orders for this visit:  Belching -     DG UGI W SMALL BOWEL; Future  Gastroesophageal reflux disease, unspecified whether esophagitis present -     DG UGI W SMALL BOWEL; Future     Further recommendations pending results of UGI series    I personally performed the service, non-incident to. (WP)  Laurine Blazer, PA-C [facility]  GI attending note.  Patient's symptom of belching occurring with movement of right upper extremity is very intriguing. Upper GI with small bowel follow-through planned as above.

## 2020-02-03 NOTE — Patient Instructions (Signed)
I will discuss care with Dr Laural Golden and call with updated recommendations

## 2020-02-10 ENCOUNTER — Other Ambulatory Visit (HOSPITAL_COMMUNITY): Payer: PPO

## 2020-02-14 ENCOUNTER — Other Ambulatory Visit: Payer: Self-pay

## 2020-02-14 ENCOUNTER — Other Ambulatory Visit (INDEPENDENT_AMBULATORY_CARE_PROVIDER_SITE_OTHER): Payer: Self-pay | Admitting: Gastroenterology

## 2020-02-14 ENCOUNTER — Ambulatory Visit (HOSPITAL_COMMUNITY)
Admission: RE | Admit: 2020-02-14 | Discharge: 2020-02-14 | Disposition: A | Discharge status: Home / Self Care | Payer: PPO | Source: Ambulatory Visit | Attending: Gastroenterology | Admitting: Gastroenterology

## 2020-02-14 DIAGNOSIS — K219 Gastro-esophageal reflux disease without esophagitis: Secondary | ICD-10-CM | POA: Insufficient documentation

## 2020-02-14 DIAGNOSIS — R142 Eructation: Secondary | ICD-10-CM | POA: Diagnosis not present

## 2020-02-15 ENCOUNTER — Other Ambulatory Visit (INDEPENDENT_AMBULATORY_CARE_PROVIDER_SITE_OTHER): Payer: Self-pay | Admitting: Gastroenterology

## 2020-02-15 MED ORDER — HYOSCYAMINE SULFATE 0.125 MG SL SUBL: 0.1250 mg | SUBLINGUAL_TABLET | Freq: Four times a day (QID) | SUBLINGUAL | Status: DC | PRN

## 2020-02-15 NOTE — Progress Notes (Signed)
UGI series results reviewed w/ patient via phone. Still having symptoms. Feels symptoms are worse and requesting medication. Will try levsin-also recommend she try phazyme OTC.   Needs f/up appt w/ Dr Laural Golden in next few weeks - he is okay to add her on to his office schedule.

## 2020-02-17 ENCOUNTER — Other Ambulatory Visit: Payer: Self-pay | Admitting: Family Medicine

## 2020-02-18 ENCOUNTER — Telehealth (INDEPENDENT_AMBULATORY_CARE_PROVIDER_SITE_OTHER): Payer: Self-pay | Admitting: *Deleted

## 2020-02-18 NOTE — Telephone Encounter (Signed)
Patient's daughter , Anderson Malta , presented to the office. She states that her mother has bad nausea then later weakness. Dr.Rehman contacted , he gave permission to call in Zofran 4 mg 1 by mouth every 4-6 hours prn for nausea # 20 no refills. This was called to [facility].

## 2020-02-21 ENCOUNTER — Ambulatory Visit
Admission: EM | Admit: 2020-02-21 | Discharge: 2020-02-21 | Disposition: A | Discharge status: Home / Self Care | Payer: PPO | Attending: Emergency Medicine | Admitting: Emergency Medicine

## 2020-02-21 ENCOUNTER — Other Ambulatory Visit: Payer: Self-pay

## 2020-02-21 DIAGNOSIS — Z20822 Contact with and (suspected) exposure to covid-19: Secondary | ICD-10-CM | POA: Diagnosis not present

## 2020-02-21 DIAGNOSIS — R110 Nausea: Secondary | ICD-10-CM

## 2020-02-21 DIAGNOSIS — R142 Eructation: Secondary | ICD-10-CM

## 2020-02-21 DIAGNOSIS — U071 COVID-19: Secondary | ICD-10-CM | POA: Diagnosis not present

## 2020-02-21 LAB — POC SARS CORONAVIRUS 2 AG -  ED: SARS Coronavirus 2 Ag: POSITIVE — AB

## 2020-02-21 MED ORDER — PROMETHAZINE HCL 12.5 MG PO TABS: 12.5000 mg | ORAL_TABLET | Freq: Two times a day (BID) | ORAL | 0 refills | Status: DC | PRN

## 2020-02-21 MED ORDER — ALUM & MAG HYDROXIDE-SIMETH 200-200-20 MG/5ML PO SUSP
30.0000 mL | Freq: Once | ORAL | Status: AC
  Administered 2020-02-21: 16:00:00 30 mL via ORAL

## 2020-02-21 NOTE — Discharge Instructions (Signed)
COVID test was positive You should remain isolated in your home for 10 days from symptom onset AND greater than 72 hours after symptoms resolution (absence of fever without the use of fever-reducing medication and improvement in respiratory symptoms), whichever is longer Get plenty of rest and push fluids Use zyrtec for nasal congestion, runny nose, and/or sore throat Use flonase for nasal congestion and runny nose  Promethazine for nausea Use OTC medications like ibuprofen or tylenol as needed fever or pain Follow up with PCP in 1-2 days via phone or e-visit for recheck and to ensure symptoms are improving Follow up with GI if belching persists or worsens over the next day or two Call or go to the ED if you have any new or worsening symptoms such as fever, worsening cough, shortness of breath, chest tightness, chest pain, turning blue, changes in mental status, etc..[name]

## 2020-02-21 NOTE — ED Provider Notes (Signed)
[facility]   [id number] 02/21/20 Arrival Time: 23  CC: Belching  SUBJECTIVE:  Kelsey Nelson is a 86 y.o. female who presents with complaint of belching x 5 months ago, worsened in the last week after having barium study.  Denies a precipitating event, trauma, close contacts with similar symptoms, recent travel or antibiotic use. Denies covid exposure.  However, did go to [facility] for barium study.  Denies abdominal or chest pain.  Describes as intermittent, and worsening.  Has tried zofran without relief.  Worse with activity.  Denies similar symptoms in the past.  Last BM last night and watery.  Complains of associated fatigue, body aches, nasal congestion, nonproductive cough, nausea, and loss of taste.    Denies fever, chills, vomiting, chest pain, SOB, constipation, hematochezia, melena, dysuria, difficulty urinating, increased frequency or urgency, flank pain, loss of bowel or bladder function, vaginal discharge, vaginal odor, vaginal bleeding, dyspareunia, pelvic pain.     No LMP recorded. Patient has had a hysterectomy.  ROS: As per HPI.  All other pertinent ROS negative.     Past Medical History:  Diagnosis Date  . Blindness of right eye   . BMI between 19-24,adult 2010 145 LBS  . Diverticula of small intestine Dec 2010  . Diverticulosis    [place]  . Functional constipation 2010  . GERD (gastroesophageal reflux disease)   . HTN (hypertension)   . Hyperlipemia   . Right eye trauma 1949   Loss of vision resulted at age 45  . Schatzki's ring Dec 2010   Past Surgical History:  Procedure Laterality Date  . ABDOMINAL HYSTERECTOMY    . BREAST REDUCTION SURGERY  1992 BIL  . COLONOSCOPY  DEC 2010   SIMPLE ADENOMAS (6-8),  TICS, SML IH  . COLONOSCOPY N/A 12/08/2013   Procedure: COLONOSCOPY;  Surgeon: Rogene Houston, MD;  [facility];  Service: Endoscopy;  Laterality: N/A;  225  . EVISCERATION Right 05/08/2015   Procedure: EVISCERATION WITH  IMPLANT RIGHT EYE;  Surgeon: Williams Che, MD;  Location: [facility];  Service: Ophthalmology;  Laterality: Right;  . EYE SURGERY  RIGHT 2005  . EYE SURGERY  right 2016   removed and false eye placed in 08/2015  . TOTAL ABDOMINAL HYSTERECTOMY W/ BILATERAL SALPINGOOPHORECTOMY  1985 FIBROIDS  . UPPER GASTROINTESTINAL ENDOSCOPY  DEC 2010   Bx-MILD GASTRITIS/DUODENITIS, DUODENAL DIVERTICULA, SCHATZKI'S RING   Allergies  Allergen Reactions  . Codeine   . Fosamax [Alendronate Sodium]     heartburn   Current Facility-Administered Medications on File Prior to Encounter  Medication Dose Route Frequency Provider Last Rate Last Admin  . hyoscyamine (LEVSIN SL) SL tablet 0.125 mg  0.125 mg Sublingual Q6H PRN Minus Liberty, PA-C       Current Outpatient Medications on File Prior to Encounter  Medication Sig Dispense Refill  . Cholecalciferol (VITAMIN D3 PO) Take 25 mcg by mouth.    . CRANBERRY PO Take by mouth daily.    . Magnesium 400 MG CAPS Take 400 mg by mouth.    . mirtazapine (REMERON) 7.5 MG tablet Take 1 tablet (7.5 mg total) by mouth at bedtime. (Patient not taking: Reported on 01/06/2020) 30 tablet 2  . Multiple Vitamin (MULTI-VITAMIN DAILY PO) Take by mouth daily. doTerra Women -  Bone Nutireint    . OVER THE COUNTER MEDICATION daily. Mega Q Plus Max - Dr. Talmage Coin 's - Daily    . pravastatin (PRAVACHOL) 20 MG tablet Take 1  tablet (20 mg total) by mouth daily. (Patient not taking: Reported on 01/06/2020) 90 tablet 3  . Probiotic Product (PROBIOTIC PO) Take by mouth daily.    . ramipril (ALTACE) 5 MG capsule TAKE 1 CAPSULE(5 MG) BY MOUTH DAILY 90 capsule 1  . Zinc 50 MG CAPS Take by mouth daily.    . [DISCONTINUED] pantoprazole (PROTONIX) 40 MG tablet Take 1 tablet (40 mg total) by mouth daily. Take 30 min before breakfast 30 tablet 3   Social History   Socioeconomic History  . Marital status: Married    Spouse name: [name]   . Number of children: 3  . Years of education: 43  .  Highest education level: 12th grade  Occupational History  . Occupation: retired   Tobacco Use  . Smoking status: Never Smoker  . Smokeless tobacco: Never Used  Substance and Sexual Activity  . Alcohol use: No    Alcohol/week: 0.0 standard drinks  . Drug use: No  . Sexual activity: Not Currently  Other Topics Concern  . 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 Difficulty of Paying Living Expenses: Not on file  Food Insecurity:   . Worried About Charity fundraiser in the Last Year: Not on file  . Ran Out of Food in the Last Year: Not on file  Transportation Needs:   . Lack of Transportation (Medical): Not on file  . Lack of Transportation (Non-Medical): Not on file  Physical Activity:   . Days of Exercise per Week: Not on file  . Minutes of Exercise per Session: Not on file  Stress:   . Feeling of Stress : Not on file  Social Connections:   . Frequency of Communication with Friends and Family: Not on file  . Frequency of Social Gatherings with Friends and Family: Not on file  . Attends Religious Services: Not on file  . Active Member of Clubs or Organizations: Not on file  . Attends Archivist Meetings: Not on file  . Marital Status: Not on file  Intimate Partner Violence:   . Fear of Current or Ex-Partner: Not on file  . Emotionally Abused: Not on file  . Physically Abused: Not on file  . Sexually Abused: Not on file   Family History  Problem Relation Age of Onset  . Pancreatic cancer Sister   . Colon cancer Paternal Aunt   . Colon polyps Neg Hx      OBJECTIVE:  Vitals:   02/21/20 1507  BP: (!) 142/86  Pulse: 92  Resp: 18  Temp: 99.9 F (37.7 C)  TempSrc: Oral  SpO2: 94%    General appearance: Alert; appears fatigued, NAD HEENT: NCAT. RT PERRL, EOMI grossly, LT prosthetic eye present; nares patent without rhinorrhea; Oropharynx  clear.  Lungs: clear to auscultation bilaterally without adventitious breath sounds Heart: regular rate and rhythm.   Abdomen: soft, non-distended; normal active bowel sounds; non-tender to light and deep palpation; nontender at McBurney's point; negative Murphy's sign; no guarding Extremities: no edema; symmetrical with no gross deformities Skin: warm and dry Neurologic: normal gait Psychological: alert and cooperative; normal mood and affect  LABS: Results for orders placed or performed during the hospital encounter of 02/21/20 (from the past 24 hour(s))  POC SARS Coronavirus 2 Ag-ED - Nasal Swab (BD Veritor Kit)     Status: Abnormal   Collection  Time: 02/21/20  3:56 PM  Result Value Ref Range   SARS Coronavirus 2 Ag Positive (A) Negative   ASSESSMENT & PLAN:  1. Lab test positive for detection of COVID-19 virus   2. Suspected COVID-19 virus infection   3. Belching   4. Nausea without vomiting     Meds ordered this encounter  Medications  . alum & mag hydroxide-simeth (MAALOX/MYLANTA) 200-200-20 MG/5ML suspension 30 mL  . promethazine (PHENERGAN) 12.5 MG tablet    Sig: Take 1 tablet (12.5 mg total) by mouth every 12 (twelve) hours as needed for nausea or vomiting.    Dispense:  10 tablet    Refill:  0    Order Specific Question:   Supervising Provider    Answer:   Raylene Everts [[id number]]   Offered patient further evaluation and management in the ED for testing positive for COVID given age and complaint of "wanting to die" earlier this week.  Declines at this time.  Would like to try outpatient therapy and follow up with PCP. Strict ED precautions given.    GI cocktail given in office without relief COVID test was positive You should remain isolated in your home for 10 days from symptom onset AND greater than 72 hours after symptoms resolution (absence of fever without the use of fever-reducing medication and improvement in respiratory symptoms), whichever is longer Get  plenty of rest and push fluids Use zyrtec for nasal congestion, runny nose, and/or sore throat Use flonase for nasal congestion and runny nose  Promethazine for nausea Use OTC medications like ibuprofen or tylenol as needed fever or pain Follow up with PCP in 1-2 days via phone or e-visit for recheck and to ensure symptoms are improving Follow up with GI if belching persists or worsens over the next day or two Call or go to the ED if you have any new or worsening symptoms such as fever, worsening cough, shortness of breath, chest tightness, chest pain, turning blue, changes in mental status, etc...    Reviewed expectations re: course of current medical issues. Questions answered. Outlined signs and symptoms indicating need for more acute intervention. Patient verbalized understanding. After Visit Summary given.   Lestine Box, PA-C 02/21/20 1721

## 2020-02-21 NOTE — ED Triage Notes (Signed)
Pt presents to UC w/ c/o burping when bending right knee x5 months. Pt has seen PCP. Pt states she had xrays of abdomen 1 wk ago. Pt states she's had nausea for 1 month and has had increased nausea x1 week since having abdomen xrays. Denies vomiting.

## 2020-02-22 ENCOUNTER — Other Ambulatory Visit: Payer: Self-pay | Admitting: Unknown Physician Specialty

## 2020-02-22 ENCOUNTER — Telehealth: Payer: Self-pay | Admitting: Unknown Physician Specialty

## 2020-02-22 ENCOUNTER — Ambulatory Visit (HOSPITAL_COMMUNITY)
Admission: RE | Admit: 2020-02-22 | Discharge: 2020-02-22 | Disposition: A | Discharge status: Home / Self Care | Payer: Medicare Other | Source: Ambulatory Visit | Attending: Pulmonary Disease | Admitting: Pulmonary Disease

## 2020-02-22 DIAGNOSIS — U071 COVID-19: Secondary | ICD-10-CM | POA: Diagnosis present

## 2020-02-22 DIAGNOSIS — I10 Essential (primary) hypertension: Secondary | ICD-10-CM | POA: Diagnosis present

## 2020-02-22 DIAGNOSIS — Z23 Encounter for immunization: Secondary | ICD-10-CM | POA: Insufficient documentation

## 2020-02-22 MED ORDER — EPINEPHRINE 0.3 MG/0.3ML IJ SOAJ: 0.3000 mg | Freq: Once | INTRAMUSCULAR | Status: DC | PRN

## 2020-02-22 MED ORDER — SODIUM CHLORIDE 0.9 % IV SOLN
700.0000 mg | Freq: Once | INTRAVENOUS | Status: AC
  Administered 2020-02-22: 700 mg via INTRAVENOUS
  Filled 2020-02-22: qty 20

## 2020-02-22 MED ORDER — SODIUM CHLORIDE 0.9 % IV SOLN
INTRAVENOUS | Status: DC | PRN
  Administered 2020-02-22: 250 mL via INTRAVENOUS

## 2020-02-22 MED ORDER — METHYLPREDNISOLONE SODIUM SUCC 125 MG IJ SOLR: 125.0000 mg | Freq: Once | INTRAMUSCULAR | Status: DC | PRN

## 2020-02-22 MED ORDER — ALBUTEROL SULFATE HFA 108 (90 BASE) MCG/ACT IN AERS: 2.0000 | INHALATION_SPRAY | Freq: Once | RESPIRATORY_TRACT | Status: DC | PRN

## 2020-02-22 MED ORDER — FAMOTIDINE IN NACL 20-0.9 MG/50ML-% IV SOLN: 20.0000 mg | Freq: Once | INTRAVENOUS | Status: DC | PRN

## 2020-02-22 MED ORDER — DIPHENHYDRAMINE HCL 50 MG/ML IJ SOLN: 50.0000 mg | Freq: Once | INTRAMUSCULAR | Status: DC | PRN

## 2020-02-22 NOTE — Discharge Instructions (Signed)
COVID-19 COVID-19 is a respiratory infection that is caused by a virus called severe acute respiratory syndrome coronavirus 2 (SARS-CoV-2). The disease is also known as coronavirus disease or novel coronavirus. In some people, the virus may not cause any symptoms. In others, it may cause a serious infection. The infection can get worse quickly and can lead to complications, such as:  Pneumonia, or infection of the lungs.  Acute respiratory distress syndrome or ARDS. This is a condition in which fluid build-up in the lungs prevents the lungs from filling with air and passing oxygen into the blood.  Acute respiratory failure. This is a condition in which there is not enough oxygen passing from the lungs to the body or when carbon dioxide is not passing from the lungs out of the body.  Sepsis or septic shock. This is a serious bodily reaction to an infection.  Blood clotting problems.  Secondary infections due to bacteria or fungus.  Organ failure. This is when your body's organs stop working. The virus that causes COVID-19 is contagious. This means that it can spread from person to person through droplets from coughs and sneezes (respiratory secretions). What are the causes? This illness is caused by a virus. You may catch the virus by:  Breathing in droplets from an infected person. Droplets can be spread by a person breathing, speaking, singing, coughing, or sneezing.  Touching something, like a table or a doorknob, that was exposed to the virus (contaminated) and then touching your mouth, nose, or eyes. What increases the risk? Risk for infection You are more likely to be infected with this virus if you:  Are within 6 feet (2 meters) of a person with COVID-19.  Provide care for or live with a person who is infected with COVID-19.  Spend time in crowded indoor spaces or live in shared housing. Risk for serious illness You are more likely to become seriously ill from the virus if  you:  Are 50 years of age or older. The higher your age, the more you are at risk for serious illness.  Live in a nursing home or long-term care facility.  Have cancer.  Have a long-term (chronic) disease such as: ? Chronic lung disease, including chronic obstructive pulmonary disease or asthma. ? A long-term disease that lowers your body's ability to fight infection (immunocompromised). ? Heart disease, including heart failure, a condition in which the arteries that lead to the heart become narrow or blocked (coronary artery disease), a disease which makes the heart muscle thick, weak, or stiff (cardiomyopathy). ? Diabetes. ? Chronic kidney disease. ? Sickle cell disease, a condition in which red blood cells have an abnormal "sickle" shape. ? Liver disease.  Are obese. What are the signs or symptoms? Symptoms of this condition can range from mild to severe. Symptoms may appear any time from 2 to 14 days after being exposed to the virus. They include:  A fever or chills.  A cough.  Difficulty breathing.  Headaches, body aches, or muscle aches.  Runny or stuffy (congested) nose.  A sore throat.  New loss of taste or smell. Some people may also have stomach problems, such as nausea, vomiting, or diarrhea. Other people may not have any symptoms of COVID-19. How is this diagnosed? This condition may be diagnosed based on:  Your signs and symptoms, especially if: ? You live in an area with a COVID-19 outbreak. ? You recently traveled to or from an area where the virus is common. ? You   provide care for or live with a person who was diagnosed with COVID-19. ? You were exposed to a person who was diagnosed with COVID-19.  A physical exam.  Lab tests, which may include: ? Taking a sample of fluid from the back of your nose and throat (nasopharyngeal fluid), your nose, or your throat using a swab. ? A sample of mucus from your lungs (sputum). ? Blood tests.  Imaging tests,  which may include, X-rays, CT scan, or ultrasound. How is this treated? At present, there is no medicine to treat COVID-19. Medicines that treat other diseases are being used on a trial basis to see if they are effective against COVID-19. Your health care provider will talk with you about ways to treat your symptoms. For most people, the infection is mild and can be managed at home with rest, fluids, and over-the-counter medicines. Treatment for a serious infection usually takes places in a hospital intensive care unit (ICU). It may include one or more of the following treatments. These treatments are given until your symptoms improve.  Receiving fluids and medicines through an IV.  Supplemental oxygen. Extra oxygen is given through a tube in the nose, a face mask, or a hood.  Positioning you to lie on your stomach (prone position). This makes it easier for oxygen to get into the lungs.  Continuous positive airway pressure (CPAP) or bi-level positive airway pressure (BPAP) machine. This treatment uses mild air pressure to keep the airways open. A tube that is connected to a motor delivers oxygen to the body.  Ventilator. This treatment moves air into and out of the lungs by using a tube that is placed in your windpipe.  Tracheostomy. This is a procedure to create a hole in the neck so that a breathing tube can be inserted.  Extracorporeal membrane oxygenation (ECMO). This procedure gives the lungs a chance to recover by taking over the functions of the heart and lungs. It supplies oxygen to the body and removes carbon dioxide. Follow these instructions at home: Lifestyle  If you are sick, stay home except to get medical care. Your health care provider will tell you how long to stay home. Call your health care provider before you go for medical care.  Rest at home as told by your health care provider.  Do not use any products that contain nicotine or tobacco, such as cigarettes,  e-cigarettes, and chewing tobacco. If you need help quitting, ask your health care provider.  Return to your normal activities as told by your health care provider. Ask your health care provider what activities are safe for you. General instructions  Take over-the-counter and prescription medicines only as told by your health care provider.  Drink enough fluid to keep your urine pale yellow.  Keep all follow-up visits as told by your health care provider. This is important. How is this prevented?  There is no vaccine to help prevent COVID-19 infection. However, there are steps you can take to protect yourself and others from this virus. To protect yourself:   Do not travel to areas where COVID-19 is a risk. The areas where COVID-19 is reported change often. To identify high-risk areas and travel restrictions, check the CDC travel website: wwwnc.cdc.gov/travel/notices  If you live in, or must travel to, an area where COVID-19 is a risk, take precautions to avoid infection. ? Stay away from people who are sick. ? Wash your hands often with soap and water for 20 seconds. If soap and water   are not available, use an alcohol-based hand sanitizer. ? Avoid touching your mouth, face, eyes, or nose. ? Avoid going out in public, follow guidance from your state and local health authorities. ? If you must go out in public, wear a cloth face covering or face mask. Make sure your mask covers your nose and mouth. ? Avoid crowded indoor spaces. Stay at least 6 feet (2 meters) away from others. ? Disinfect objects and surfaces that are frequently touched every day. This may include:  Counters and tables.  Doorknobs and light switches.  Sinks and faucets.  Electronics, such as phones, remote controls, keyboards, computers, and tablets. To protect others: If you have symptoms of COVID-19, take steps to prevent the virus from spreading to others.  If you think you have a COVID-19 infection, contact  your health care provider right away. Tell your health care team that you think you may have a COVID-19 infection.  Stay home. Leave your house only to seek medical care. Do not use public transport.  Do not travel while you are sick.  Wash your hands often with soap and water for 20 seconds. If soap and water are not available, use alcohol-based hand sanitizer.  Stay away from other members of your household. Let healthy household members care for children and pets, if possible. If you have to care for children or pets, wash your hands often and wear a mask. If possible, stay in your own room, separate from others. Use a different bathroom.  Make sure that all people in your household wash their hands well and often.  Cough or sneeze into a tissue or your sleeve or elbow. Do not cough or sneeze into your hand or into the air.  Wear a cloth face covering or face mask. Make sure your mask covers your nose and mouth. Where to find more information  Centers for Disease Control and Prevention: www.cdc.gov/coronavirus/2019-ncov/index.html  World Health Organization: www.who.int/health-topics/coronavirus Contact a health care provider if:  You live in or have traveled to an area where COVID-19 is a risk and you have symptoms of the infection.  You have had contact with someone who has COVID-19 and you have symptoms of the infection. Get help right away if:  You have trouble breathing.  You have pain or pressure in your chest.  You have confusion.  You have bluish lips and fingernails.  You have difficulty waking from sleep.  You have symptoms that get worse. These symptoms may represent a serious problem that is an emergency. Do not wait to see if the symptoms will go away. Get medical help right away. Call your local emergency services (911 in the [place] Do not drive yourself to the hospital. Let the emergency medical personnel know if you think you have  COVID-19. Summary  COVID-19 is a respiratory infection that is caused by a virus. It is also known as coronavirus disease or novel coronavirus. It can cause serious infections, such as pneumonia, acute respiratory distress syndrome, acute respiratory failure, or sepsis.  The virus that causes COVID-19 is contagious. This means that it can spread from person to person through droplets from breathing, speaking, singing, coughing, or sneezing.  You are more likely to develop a serious illness if you are 50 years of age or older, have a weak immune system, live in a nursing home, or have chronic disease.  There is no medicine to treat COVID-19. Your health care provider will talk with you about ways to treat your symptoms.    Take steps to protect yourself and others from infection. Wash your hands often and disinfect objects and surfaces that are frequently touched every day. Stay away from people who are sick and wear a mask if you are sick. This information is not intended to replace advice given to you by your health care provider. Make sure you discuss any questions you have with your health care provider. Document Revised: 10/08/2019 Document Reviewed: 01/14/2019 Elsevier Patient Education  2020 [employer]. What types of side effects do monoclonal antibody drugs cause?  Common side effects  In general, the more common side effects caused by monoclonal antibody drugs include: . Allergic reactions, such as hives or itching . Flu-like signs and symptoms, including chills, fatigue, fever, and muscle aches and pains . Nausea, vomiting . Diarrhea . Skin rashes . Low blood pressure   The CDC is recommending patients who receive monoclonal antibody treatments wait at least 90 days before being vaccinated.  Currently, there are no data on the safety and efficacy of mRNA COVID-19 vaccines in persons who received monoclonal antibodies or convalescent plasma as part of COVID-19 treatment. Based  on the estimated half-life of such therapies as well as evidence suggesting that reinfection is uncommon in the 90 days after initial infection, vaccination should be deferred for at least 90 days, as a precautionary measure until additional information becomes available, to avoid interference of the antibody treatment with vaccine-induced immune responses. 

## 2020-02-22 NOTE — Telephone Encounter (Signed)
  I connected by phone with Kelsey Nelson on 02/22/2020 at 9:12 AM to discuss the potential use of an new treatment for mild to moderate COVID-19 viral infection in non-hospitalized patients.  This patient is a 86 y.o. female that meets the FDA criteria for Emergency Use Authorization of bamlanivimab or casirivimab\imdevimab.  Has a (+) direct SARS-CoV-2 viral test result  Has mild or moderate COVID-19   Is ? 86 years of age and weighs ? 40 kg  Is NOT hospitalized due to COVID-19  Is NOT requiring oxygen therapy or requiring an increase in baseline oxygen flow rate due to COVID-19  Is within 10 days of symptom onset  Has at least one of the high risk factor(s) for progression to severe COVID-19 and/or hospitalization as defined in EUA.  Specific high risk criteria : >/= 84 yo   I have spoken and communicated the following to the patient or parent/caregiver:  1. FDA has authorized the emergency use of bamlanivimab and casirivimab\imdevimab for the treatment of mild to moderate COVID-19 in adults and pediatric patients with positive results of direct SARS-CoV-2 viral testing who are 68 years of age and older weighing at least 40 kg, and who are at high risk for progressing to severe COVID-19 and/or hospitalization.  2. The significant known and potential risks and benefits of bamlanivimab and casirivimab\imdevimab, and the extent to which such potential risks and benefits are unknown.  3. Information on available alternative treatments and the risks and benefits of those alternatives, including clinical trials.  4. Patients treated with bamlanivimab and casirivimab\imdevimab should continue to self-isolate and use infection control measures (e.g., wear mask, isolate, social distance, avoid sharing personal items, clean and disinfect "high touch" surfaces, and frequent handwashing) according to CDC guidelines.   5. The patient or parent/caregiver has the option to accept or refuse  bamlanivimab or casirivimab\imdevimab .  After reviewing this information with the patient, The patient agreed to proceed with receiving the bamlanimivab infusion and will be provided a copy of the Fact sheet prior to receiving the infusion.Kathrine Haddock 02/22/2020 9:12 AM Sx onset 2/22

## 2020-02-22 NOTE — Progress Notes (Signed)
  Diagnosis: COVID-19  Physician:dr wright  Procedure: [facility] Med: bamlanivimab infusion - Provided patient with bamlanimivab fact sheet for patients, parents and caregivers prior to infusion.  Complications: No immediate complications noted.  Discharge: Discharged home   [place] 02/22/2020

## 2020-02-25 ENCOUNTER — Other Ambulatory Visit: Payer: Self-pay

## 2020-02-25 ENCOUNTER — Ambulatory Visit (INDEPENDENT_AMBULATORY_CARE_PROVIDER_SITE_OTHER): Payer: PPO | Admitting: Family Medicine

## 2020-02-25 ENCOUNTER — Encounter: Payer: Self-pay | Admitting: Family Medicine

## 2020-02-25 VITALS — BP 119/53 | Ht 65.5 in | Wt 130.0 lb

## 2020-02-25 DIAGNOSIS — U071 COVID-19: Secondary | ICD-10-CM | POA: Insufficient documentation

## 2020-02-25 DIAGNOSIS — I10 Essential (primary) hypertension: Secondary | ICD-10-CM

## 2020-02-25 MED ORDER — PREDNISONE 5 MG PO TABS: 5.0000 mg | ORAL_TABLET | Freq: Two times a day (BID) | ORAL | 0 refills | Status: AC

## 2020-02-25 NOTE — Progress Notes (Signed)
Virtual Visit via Telephone Note  I connected with Kelsey Nelson on 02/25/20 at  9:20 AM EST by telephone and verified that I am speaking with the correct person using two identifiers.  Location: Patient: home Provider: office   I discussed the limitations, risks, security and privacy concerns of performing an evaluation and management service by telephone and the availability of in person appointments. I also discussed with the patient that there may be a patient responsible charge related to this service. The patient expressed understanding and agreed to proceed.   History of Present Illness: covid positive x 5 days, had infusion on 03/02/20201and is beginning to feel better gradually Still c/o  Cough, no body aches, no taste or smell, fatigue, however taste returning slighhtly this morning, never had a fever Self quarantining at home, no other family member is ill States she had been feeling somewhat ill in the past 2 weeks before diagnosis, so she did  Not get the vaccine when her spouse went.Will get her vaccines in 3 months C/o excess belching has appt with Gastro next week   Observations/Objective: BP (!) 119/53   Ht 5' 5.5" (1.664 m)   Wt 130 lb (59 kg)   BMI 21.30 kg/m  Good communication with no confusion and intact memory. Alert and oriented x 3 No signs of respiratory distress during speech    Assessment and Plan: COVID-19 virus infection Gradually improving though still symptomatic. No taste or smell and poor appetite  5 day course of prednisone prescribed. Re educated re future vaccination and quarantine    Follow Up Instructions:    I discussed the assessment and treatment plan with the patient. The patient was provided an opportunity to ask questions and all were answered. The patient agreed with the plan and demonstrated an understanding of the instructions.   The patient was advised to call back or seek an in-person evaluation if the symptoms worsen or  if the condition fails to improve as anticipated.  I provided 12 minutes of non-face-to-face time during this encounter.   Tula Nakayama, MD

## 2020-02-25 NOTE — Assessment & Plan Note (Signed)
Gradually improving though still symptomatic. No taste or smell and poor appetite  5 day course of prednisone prescribed. Re educated re future vaccination and quarantine

## 2020-02-25 NOTE — Patient Instructions (Addendum)
Please cancel March 15 appt, reschedule to end August, call if you need me sooner  5 day course of prednisone sent in , will help wih loss of taste  Check temperature every day for fever for next 10 days  Continue to self quarantine at home for total of 2 weeks  Please do get the vaccine around 90 days from diagnosis   Thankful you are feeling better and have already had your infusion  Thanks for choosing [facility], we consider it a privelige to serve you.

## 2020-02-25 NOTE — Assessment & Plan Note (Signed)
Controlled, no change in medication  

## 2020-02-28 ENCOUNTER — Ambulatory Visit (INDEPENDENT_AMBULATORY_CARE_PROVIDER_SITE_OTHER): Payer: PPO | Admitting: Internal Medicine

## 2020-02-29 ENCOUNTER — Other Ambulatory Visit: Payer: Self-pay

## 2020-02-29 ENCOUNTER — Ambulatory Visit (INDEPENDENT_AMBULATORY_CARE_PROVIDER_SITE_OTHER): Payer: PPO | Admitting: Internal Medicine

## 2020-02-29 ENCOUNTER — Encounter (INDEPENDENT_AMBULATORY_CARE_PROVIDER_SITE_OTHER): Payer: Self-pay | Admitting: Internal Medicine

## 2020-02-29 VITALS — Ht 65.5 in | Wt 124.5 lb

## 2020-02-29 DIAGNOSIS — U071 COVID-19: Secondary | ICD-10-CM | POA: Diagnosis not present

## 2020-02-29 DIAGNOSIS — R142 Eructation: Secondary | ICD-10-CM

## 2020-02-29 NOTE — Progress Notes (Signed)
Virtual Visit via Telephone Note  Patient had face-to-face visit scheduled today but visit was changed to virtual/telephone visit because she has been diagnosed with Covid and is under quarantine. Patient agreed to proceed with virtual visit. I connected with Kelsey Nelson on 02/29/20 at 11:04 AM EST by telephone and verified that I am speaking with the correct person using two identifiers.  Location: Patient: home Provider: office   I discussed the limitations, risks, security and privacy concerns of performing an evaluation and management service by telephone and the availability of in person appointments. I also discussed with the patient that there may be a patient responsible charge related to this service. The patient expressed understanding and agreed to proceed.   History of Present Illness:  Patient is 86 year old Caucasian female who was seen 1 month ago for frequent burping which was triggered when she moved her right lower extremity.  She was not having vomiting heartburn dysphagia or abdominal pain.  She had tried a PPI but it did not help.  She was supposed to try hyoscyamine sublingual but prescription was not called.  Therefore she did not try this medication.  As planned she had upper GI series.  This study revealed small sliding hiatal hernia as scattered diverticula involving small bowel.  Patient states she has not had any breathing problems since she was diagnosed with Covid.  She just had lost her sense of taste.  She reports less burping.  She denies nausea vomiting heartburn or dysphagia.  She has noted decrease in her appetite and has lost 6 pounds since she was last seen 1 month ago.  She denies diarrhea constipation melena or rectal bleeding.  Her bowels move daily.  She does not feel that she is passing too much flatus.  She also does not feel bloated in her abdomen. She still burps when she walks her moves her right lower extremity.  Patient says she has been walking 3  miles every day until she was diagnosed with Covid. Patient wants to tell me that [place] [address] [place].   Observations/Objective:  Patient reported her weight to be 124.5 pounds. She weighed 130 pounds and 11 ounces on 02/03/2020.  Assessment and Plan:  Frequent burping which is also triggered when she moves her right lower extremity and also with walking.  I suspect this is because with these maneuvers she is using abdominal muscles which which is on the stomach and induces burping.  She has scattered diverticuli involving small bowel but her symptoms are not suggestive of bacterial overgrowth.  Therefore treatment at this point would be symptomatic.  She could also have aerophagia.  Follow Up Instructions:  Patient advised to try Phazyme 180 mg up to 3 times a day as needed.  She can take it on schedule for few days and see if it works and if it does thereafter she will use it on as-needed basis. Office visit in 1 month.  I discussed the assessment and treatment plan with the patient. The patient was provided an opportunity to ask questions and all were answered. The patient agreed with the plan and demonstrated an understanding of the instructions.   The patient was advised to call back or seek an in-person evaluation if the symptoms worsen or if the condition fails to improve as anticipated.  I provided 15 minutes of non-face-to-face time during this encounter.   Hildred Laser, MD

## 2020-03-06 ENCOUNTER — Ambulatory Visit: Payer: PPO | Admitting: Family Medicine

## 2020-03-10 DIAGNOSIS — L821 Other seborrheic keratosis: Secondary | ICD-10-CM | POA: Diagnosis not present

## 2020-03-10 DIAGNOSIS — D3610 Benign neoplasm of peripheral nerves and autonomic nervous system, unspecified: Secondary | ICD-10-CM | POA: Diagnosis not present

## 2020-03-10 DIAGNOSIS — B353 Tinea pedis: Secondary | ICD-10-CM | POA: Diagnosis not present

## 2020-03-10 DIAGNOSIS — Z23 Encounter for immunization: Secondary | ICD-10-CM | POA: Diagnosis not present

## 2020-03-10 DIAGNOSIS — L578 Other skin changes due to chronic exposure to nonionizing radiation: Secondary | ICD-10-CM | POA: Diagnosis not present

## 2020-03-28 ENCOUNTER — Other Ambulatory Visit (INDEPENDENT_AMBULATORY_CARE_PROVIDER_SITE_OTHER): Payer: Self-pay | Admitting: Internal Medicine

## 2020-03-28 ENCOUNTER — Other Ambulatory Visit: Payer: Self-pay

## 2020-03-28 ENCOUNTER — Encounter (INDEPENDENT_AMBULATORY_CARE_PROVIDER_SITE_OTHER): Payer: Self-pay | Admitting: Internal Medicine

## 2020-03-28 ENCOUNTER — Ambulatory Visit (INDEPENDENT_AMBULATORY_CARE_PROVIDER_SITE_OTHER): Payer: PPO | Admitting: Internal Medicine

## 2020-03-28 VITALS — BP 128/74 | HR 82 | Temp 97.1°F | Ht 65.5 in | Wt 126.6 lb

## 2020-03-28 DIAGNOSIS — R142 Eructation: Secondary | ICD-10-CM | POA: Diagnosis not present

## 2020-03-28 DIAGNOSIS — K219 Gastro-esophageal reflux disease without esophagitis: Secondary | ICD-10-CM

## 2020-03-28 MED ORDER — ESOMEPRAZOLE MAGNESIUM 40 MG PO CPDR: 40.0000 mg | DELAYED_RELEASE_CAPSULE | Freq: Every day | ORAL | 0 refills | Status: DC

## 2020-03-28 MED ORDER — PANTOPRAZOLE SODIUM 40 MG PO TBEC: 40.0000 mg | DELAYED_RELEASE_TABLET | Freq: Every day | ORAL | 0 refills | Status: DC

## 2020-03-28 NOTE — Patient Instructions (Addendum)
Take esomeprazole by mouth 30 minutes before breakfast daily. Please call office with progress report in 4 weeks regarding burping and belching.  Cannot

## 2020-03-28 NOTE — Progress Notes (Signed)
Presenting complaint;  Frequent burping spells.  Database and subjective:  Patient is 86 year old Caucasian female who has been having frequent burping spells for the last 5 to 6 months.  She was seen in the office by Ms. Kelsey Blazer, PA-C on 02/03/2020.  She was given prescription for pantoprazole which she took for a month but it did not help.  She was in her try Levsin but due to a mixup prescription was not sent to her pharmacy.  Because of persistent belching upper GI series was obtained on 02/14/2020 which revealed some very small sliding hiatal hernia without evidence of reflux and few diverticula scattered through small bowel. She had virtual visit with me on 02/29/2020 and I recommended trying Phazyme on schedule. Patient states she has noticed some improvement with Gas-X which she takes twice a day.  She also has been using Tums in the past.  She says bursa never allowed.  Burps are not associated with nausea vomiting abdominal or chest pain.  Most of the time burping occurs when she bends her moves her right leg.  Her bowels move daily or every other day.  She does not feel that she passes much flatus.  She denies melena or rectal bleeding.  She tested positive for Covid last month.  She says it taste sensation has not fully recovered.  She also complains of lack of energy and unable to sleep.  She has tried melatonin and Benadryl without any benefit.   Current Medications: Outpatient Encounter Medications as of 03/28/2020  Medication Sig  . Calcium Carbonate Antacid (TUMS PO) Take by mouth. Per patient she takes as needed for acid reflux , heart burn. Maybe 2 a day.  . Cholecalciferol (VITAMIN D3 PO) Take 25 mcg by mouth.  . Magnesium 400 MG CAPS Take 400 mg by mouth daily.  . Multiple Vitamin (MULTI-VITAMIN DAILY PO) Take by mouth daily. doTerra Women -  Bone Nutireint  . OVER THE COUNTER MEDICATION daily. Mega Q Plus Max - Dr. Talmage Nelson 's - Daily  . Probiotic Product (PROBIOTIC DAILY  PO) Take by mouth daily.  . ramipril (ALTACE) 5 MG capsule TAKE 1 CAPSULE(5 MG) BY MOUTH DAILY  . Simethicone (GAS-X PO) Take by mouth. Per Patient she takes a couple (2) a day.  . [DISCONTINUED] docusate sodium (COLACE) 100 MG capsule Take 100 mg by mouth daily.  . [DISCONTINUED] pantoprazole (PROTONIX) 40 MG tablet Take 1 tablet (40 mg total) by mouth daily. Take 30 min before breakfast  . [DISCONTINUED] pravastatin (PRAVACHOL) 20 MG tablet Take 1 tablet (20 mg total) by mouth daily. (Patient not taking: Reported on 02/29/2020)   Facility-Administered Encounter Medications as of 03/28/2020  Medication  . hyoscyamine (LEVSIN SL) SL tablet 0.125 mg     Objective: Blood pressure 128/74, pulse 82, temperature (!) 97.1 F (36.2 C), temperature source Temporal, height 5' 5.5" (1.664 m), weight 126 lb 9.6 oz (57.4 kg). Patient is alert and in no acute distress. Patient is wearing a facial mask. Conjunctiva is pink. Sclera is nonicteric Oropharyngeal mucosa is normal. No neck masses or thyromegaly noted. Cardiac exam with regular rhythm normal S1 and S2. No murmur or gallop noted. Lungs are clear to auscultation. Abdomen is symmetrical soft and nontender with organomegaly or masses. No LE edema or clubbing noted.   Assessment:  #1.  Frequent burping.  Burps occur when she moves her right lower extremity.  I believe it is because she is using abdominal muscles which compresses on the stomach did  use burping.  Her burping is not associated with any other worrisome symptoms such as nausea vomiting weight loss or pain.  I suspect she has aerophagia.  Upper GI series revealed few scattered diverticula involving small intestine but she does not have any other symptoms to suggest small intestinal bacterial overgrowth. I would like for her to try another PPI for a few weeks and see if things will improve.  #2.  Chronic insomnia.  I recommended that she follow-up with Dr.  Moshe Nelson.  Plan:  Esomeprazole 40 mg by mouth daily before breakfast.  Patient was given 1 month supply without refill. Patient will try this medication and call office with progress report. Office visit on as-needed basis.

## 2020-03-30 ENCOUNTER — Other Ambulatory Visit (INDEPENDENT_AMBULATORY_CARE_PROVIDER_SITE_OTHER): Payer: Self-pay | Admitting: Internal Medicine

## 2020-04-25 DIAGNOSIS — Z4420 Encounter for fitting and adjustment of artificial eye, unspecified: Secondary | ICD-10-CM | POA: Diagnosis not present

## 2020-04-27 DIAGNOSIS — Z4420 Encounter for fitting and adjustment of artificial eye, unspecified: Secondary | ICD-10-CM | POA: Diagnosis not present

## 2020-05-15 ENCOUNTER — Encounter: Payer: Self-pay | Admitting: Family Medicine

## 2020-05-15 ENCOUNTER — Other Ambulatory Visit: Payer: Self-pay

## 2020-05-15 ENCOUNTER — Other Ambulatory Visit: Payer: Self-pay | Admitting: Family Medicine

## 2020-05-15 ENCOUNTER — Ambulatory Visit (INDEPENDENT_AMBULATORY_CARE_PROVIDER_SITE_OTHER): Payer: PPO | Admitting: Family Medicine

## 2020-05-15 VITALS — BP 161/78 | HR 81 | Temp 97.8°F | Wt 131.2 lb

## 2020-05-15 DIAGNOSIS — G4700 Insomnia, unspecified: Secondary | ICD-10-CM | POA: Diagnosis not present

## 2020-05-15 DIAGNOSIS — N390 Urinary tract infection, site not specified: Secondary | ICD-10-CM | POA: Diagnosis not present

## 2020-05-15 DIAGNOSIS — R351 Nocturia: Secondary | ICD-10-CM

## 2020-05-15 LAB — POCT URINALYSIS DIPSTICK
Bilirubin, UA: NEGATIVE
Glucose, UA: NEGATIVE
Ketones, UA: NEGATIVE
Nitrite, UA: POSITIVE
Protein, UA: NEGATIVE
Spec Grav, UA: 1.02 (ref 1.010–1.025)
Urobilinogen, UA: 0.2 E.U./dL
pH, UA: 6.5 (ref 5.0–8.0)

## 2020-05-15 MED ORDER — AMOXICILLIN 250 MG PO CAPS: 250.0000 mg | ORAL_CAPSULE | Freq: Two times a day (BID) | ORAL | 0 refills | Status: DC

## 2020-05-15 NOTE — Progress Notes (Signed)
Established Patient Office Visit  Subjective:  Patient ID: Kelsey Nelson, female    DOB: [birth year]  Age: 86 y.o. MRN: [id number]  CC:  Chief Complaint  Patient presents with  . Urinary Frequency    about 2 weeks now at night ,disturbs sleep    HPI Kelsey Nelson presents for difficulty sleeping due to urinary frequency.    COVID  3/21-fatigue continues  Difficulty with taste and smell. No difficulty with breathing, no fever, no diarrhea, no headaches. Pt with difficulty being motivated and "has no energy".   No pain with urination-nocturia. No h/o of UTI, no kidney stones in the past. No incontinence   Past Medical History:  Diagnosis Date  . Blindness of right eye   . BMI between 19-24,adult 2010 145 LBS  . Diverticula of small intestine Dec 2010  . Diverticulosis    [place]  . Functional constipation 2010  . GERD (gastroesophageal reflux disease)   . HTN (hypertension)   . Hyperlipemia   . Right eye trauma 1949   Loss of vision resulted at age 77  . Schatzki's ring Dec 2010    Past Surgical History:  Procedure Laterality Date  . ABDOMINAL HYSTERECTOMY    . BREAST REDUCTION SURGERY  1992 BIL  . COLONOSCOPY  DEC 2010   SIMPLE ADENOMAS (6-8), [place] TICS, SML IH  . COLONOSCOPY N/A 12/08/2013   Procedure: COLONOSCOPY;  Surgeon: Rogene Houston, MD;  [facility];  Service: Endoscopy;  Laterality: N/A;  225  . EVISCERATION Right 05/08/2015   Procedure: EVISCERATION WITH IMPLANT RIGHT EYE;  Surgeon: Williams Che, MD;  Location: [facility];  Service: Ophthalmology;  Laterality: Right;  . EYE SURGERY  RIGHT 2005  . EYE SURGERY  right 2016   removed and false eye placed in 08/2015  . TOTAL ABDOMINAL HYSTERECTOMY W/ BILATERAL SALPINGOOPHORECTOMY  1985 FIBROIDS  . UPPER GASTROINTESTINAL ENDOSCOPY  DEC 2010   Bx-MILD GASTRITIS/DUODENITIS, DUODENAL DIVERTICULA, SCHATZKI'S RING    Family History  Problem Relation Age of Onset  . Pancreatic cancer Sister   . Colon  cancer Paternal Aunt   . Colon polyps Neg Hx     Social History   Socioeconomic History  . Marital status: Married    Spouse name: [name]   . Number of children: 3  . Years of education: 79  . Highest education level: 12th grade  Occupational History  . Occupation: retired   Tobacco Use  . Smoking status: Never Smoker  . Smokeless tobacco: Never Used  Substance and Sexual Activity  . Alcohol use: No    Alcohol/week: 0.0 standard drinks  . Drug use: No  . Sexual activity: Not Currently  Other Topics Concern  . 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 Difficulty of Paying Living Expenses:   Food Insecurity:   . Worried About Charity fundraiser in the Last Year:   . Arboriculturist in the Last Year:   Transportation Needs:   . Film/video editor (Medical):   [name] Lack of Transportation (Non-Medical):   Physical Activity:   . Days of Exercise per Week:   . Minutes of Exercise per Session:   Stress:   . Feeling of Stress :   Social Connections:   . Frequency of Communication with Friends and Family:   . Frequency of  Social Gatherings with Friends and Family:   . Attends Religious Services:   . Active Member of Clubs or Organizations:   . Attends Archivist Meetings:   [name] Marital Status:   Intimate Partner Violence:   . Fear of Current or Ex-Partner:   . Emotionally Abused:   [name] Physically Abused:   . Sexually Abused:     Outpatient Medications Prior to Visit  Medication Sig Dispense Refill  . Calcium Carbonate Antacid (TUMS PO) Take by mouth. Per patient she takes as needed for acid reflux , heart burn. Maybe 2 a day.    . Cholecalciferol (VITAMIN D3 PO) Take 25 mcg by mouth.    . esomeprazole (NEXIUM) 40 MG capsule TAKE 1 CAPSULE(40 MG) BY MOUTH DAILY BEFORE BREAKFAST 30 capsule 0  . Magnesium 400 MG CAPS Take 400 mg by mouth daily.    .  Multiple Vitamin (MULTI-VITAMIN DAILY PO) Take by mouth daily. doTerra Women -  Bone Nutireint    . OVER THE COUNTER MEDICATION daily. Mega Q Plus Max - Dr. Talmage Coin 's - Daily    . pantoprazole (PROTONIX) 40 MG tablet TAKE 1 TABLET(40 MG) BY MOUTH DAILY BEFORE BREAKFAST 90 tablet 0  . Probiotic Product (PROBIOTIC DAILY PO) Take by mouth daily.    . ramipril (ALTACE) 5 MG capsule TAKE 1 CAPSULE(5 MG) BY MOUTH DAILY 90 capsule 1  . Simethicone (GAS-X PO) Take by mouth. Per Patient she takes a couple (2) a day.     Facility-Administered Medications Prior to Visit  Medication Dose Route Frequency Provider Last Rate Last Admin  . hyoscyamine (LEVSIN SL) SL tablet 0.125 mg  0.125 mg Sublingual Q6H PRN Laurine Blazer A, PA-C        Allergies  Allergen Reactions  . Codeine   . Fosamax [Alendronate Sodium]     heartburn    ROS Review of Systems  Constitutional: Positive for fatigue. Negative for fever.  Respiratory: Negative.   Cardiovascular: Negative.   Gastrointestinal: Negative.   Genitourinary: Positive for urgency. Negative for dysuria and hematuria.       Nocturia  Musculoskeletal: Negative.   Neurological: Negative.       Objective:    Physical Exam  Constitutional: She is oriented to person, place, and time. She appears well-developed and well-nourished.  HENT:  Head: Normocephalic and atraumatic.  Eyes: Conjunctivae are normal.  Cardiovascular: Normal rate, regular rhythm and normal heart sounds.  Musculoskeletal:        General: Normal range of motion.  Neurological: She is alert and oriented to person, place, and time.  Psychiatric: She has a normal mood and affect. Her behavior is normal.    BP (!) 161/78 (BP Location: Right Arm, Patient Position: Sitting)   Pulse 81   Temp 97.8 F (36.6 C) (Temporal)   Wt 131 lb 3.2 oz (59.5 kg)   SpO2 95%   BMI 21.50 kg/m  Wt Readings from Last 3 Encounters:  05/15/20 131 lb 3.2 oz (59.5 kg)  03/28/20 126 lb 9.6 oz (57.4  kg)  02/29/20 124 lb 8 oz (56.5 kg)     Health Maintenance Due  Topic Date Due  . COVID-19 Vaccine (1) Never done     Lab Results  Component Value Date   TSH 3.21 06/08/2019   Lab Results  Component Value Date   WBC 4.9 06/08/2019   HGB 14.1 06/08/2019   HCT 42.7 06/08/2019   MCV 92.2 06/08/2019   PLT 263 06/08/2019  Lab Results  Component Value Date   NA 139 10/01/2019   K 4.2 10/01/2019   CO2 31 10/01/2019   GLUCOSE 98 10/01/2019   BUN 16 10/01/2019   CREATININE 0.96 (H) 10/01/2019   BILITOT 0.6 10/01/2019   ALKPHOS 78 12/04/2018   AST 25 10/01/2019   ALT 19 10/01/2019   PROT 6.7 10/01/2019   ALBUMIN 3.6 12/04/2018   CALCIUM 9.8 10/01/2019   ANIONGAP 8 12/04/2018   Lab Results  Component Value Date   CHOL 240 (H) 10/01/2019   Lab Results  Component Value Date   HDL 96 10/01/2019   Lab Results  Component Value Date   LDLCALC 122 (H) 10/01/2019   Lab Results  Component Value Date   TRIG 116 10/01/2019   Lab Results  Component Value Date   CHOLHDL 2.5 10/01/2019   Lab Results  Component Value Date   HGBA1C 5.6 09/03/2016      Assessment & Plan:  1. Urinary tract infection without hematuria, site unspecified Amoxil-rx - POCT Urinalysis Dipstick - Urine Culture 2. Nocturia UTI currently  3. Insomnia, unspecified type Benadryl 25mg  1/2-1 as needed-10-12 hours from taking medication to operating a vehicle due to drowsiness. Pt states she does not have an issue when taking tylenol PM. Follow-up: urine culture  Laykin Rainone Hannah Beat, MD

## 2020-05-15 NOTE — Patient Instructions (Signed)
Drink plenty of water-stop at 6pm Focus on eating fresh or frozen NOT box and can Trial of benadryl 25mg  1/2-1 at night Amoxil 250mg  -take twice a day for urinary symptoms We will call about any change needed in medication

## 2020-05-18 LAB — URINE CULTURE
MICRO NUMBER:: 10512441
SPECIMEN QUALITY:: ADEQUATE

## 2020-05-18 LAB — HOUSE ACCOUNT TRACKING

## 2020-05-20 ENCOUNTER — Ambulatory Visit
Admission: EM | Admit: 2020-05-20 | Discharge: 2020-05-20 | Disposition: A | Discharge status: Home / Self Care | Payer: PPO | Attending: Emergency Medicine | Admitting: Emergency Medicine

## 2020-05-20 DIAGNOSIS — N3001 Acute cystitis with hematuria: Secondary | ICD-10-CM | POA: Insufficient documentation

## 2020-05-20 DIAGNOSIS — R350 Frequency of micturition: Secondary | ICD-10-CM

## 2020-05-20 DIAGNOSIS — R21 Rash and other nonspecific skin eruption: Secondary | ICD-10-CM | POA: Diagnosis not present

## 2020-05-20 LAB — POCT URINALYSIS DIP (MANUAL ENTRY)
Bilirubin, UA: NEGATIVE
Glucose, UA: NEGATIVE mg/dL
Ketones, POC UA: NEGATIVE mg/dL
Nitrite, UA: POSITIVE — AB
Protein Ur, POC: NEGATIVE mg/dL
Spec Grav, UA: 1.01 (ref 1.010–1.025)
Urobilinogen, UA: 0.2 E.U./dL
pH, UA: 6 (ref 5.0–8.0)

## 2020-05-20 MED ORDER — PHENAZOPYRIDINE HCL 200 MG PO TABS: 200.0000 mg | ORAL_TABLET | Freq: Three times a day (TID) | ORAL | 0 refills | Status: DC

## 2020-05-20 MED ORDER — CEPHALEXIN 500 MG PO CAPS: 500.0000 mg | ORAL_CAPSULE | Freq: Two times a day (BID) | ORAL | 0 refills | Status: DC

## 2020-05-20 MED ORDER — TRIAMCINOLONE ACETONIDE 0.1 % EX CREA: 1.0000 "application " | TOPICAL_CREAM | Freq: Two times a day (BID) | CUTANEOUS | 0 refills | Status: DC

## 2020-05-20 NOTE — ED Provider Notes (Signed)
[facility]   CC: Urinary frequency  SUBJECTIVE:  Kelsey Nelson is a 86 y.o. female who complains of urinary frequency and cloudy urine x 1 day.  Recently finished antibiotics for UTI, once antibiotics were completed she started to experience symptoms again.  Denies pain.  Denies alleviating factors.  Symptoms are made worse with urination.  Admits to similar symptoms in the past.  Denies fever, chills, nausea, vomiting, abdominal pain, flank pain, abnormal vaginal discharge or bleeding, hematuria.    Mentions itchy rash to LT outside of ankle.  Denies alleviating or aggravating factors.  Has not seen PCP for this.    LMP: No LMP recorded. Patient has had a hysterectomy.  ROS: As in HPI.  All other pertinent ROS negative.     Past Medical History:  Diagnosis Date  . Blindness of right eye   . BMI between 19-24,adult 2010 145 LBS  . Diverticula of small intestine Dec 2010  . Diverticulosis    [place]  . Functional constipation 2010  . GERD (gastroesophageal reflux disease)   . HTN (hypertension)   . Hyperlipemia   . Right eye trauma 1949   Loss of vision resulted at age 28  . Schatzki's ring Dec 2010   Past Surgical History:  Procedure Laterality Date  . ABDOMINAL HYSTERECTOMY    . BREAST REDUCTION SURGERY  1992 BIL  . COLONOSCOPY  DEC 2010   SIMPLE ADENOMAS (6-8), [place] TICS, SML IH  . COLONOSCOPY N/A 12/08/2013   Procedure: COLONOSCOPY;  Surgeon: Rogene Houston, MD;  [facility];  Service: Endoscopy;  Laterality: N/A;  225  . EVISCERATION Right 05/08/2015   Procedure: EVISCERATION WITH IMPLANT RIGHT EYE;  Surgeon: Williams Che, MD;  Location: [facility];  Service: Ophthalmology;  Laterality: Right;  . EYE SURGERY  RIGHT 2005  . EYE SURGERY  right 2016   removed and false eye placed in 08/2015  . TOTAL ABDOMINAL HYSTERECTOMY W/ BILATERAL SALPINGOOPHORECTOMY  1985 FIBROIDS  . UPPER GASTROINTESTINAL ENDOSCOPY  DEC 2010   Bx-MILD GASTRITIS/DUODENITIS,  DUODENAL DIVERTICULA, SCHATZKI'S RING   Allergies  Allergen Reactions  . Codeine   . Fosamax [Alendronate Sodium]     heartburn   Current Facility-Administered Medications on File Prior to Encounter  Medication Dose Route Frequency Provider Last Rate Last Admin  . hyoscyamine (LEVSIN SL) SL tablet 0.125 mg  0.125 mg Sublingual Q6H PRN Minus Liberty, PA-C       Current Outpatient Medications on File Prior to Encounter  Medication Sig Dispense Refill  . Calcium Carbonate Antacid (TUMS PO) Take by mouth. Per patient she takes as needed for acid reflux , heart burn. Maybe 2 a day.    . Cholecalciferol (VITAMIN D3 PO) Take 25 mcg by mouth.    . esomeprazole (NEXIUM) 40 MG capsule TAKE 1 CAPSULE(40 MG) BY MOUTH DAILY BEFORE BREAKFAST 30 capsule 0  . Magnesium 400 MG CAPS Take 400 mg by mouth daily.    . Multiple Vitamin (MULTI-VITAMIN DAILY PO) Take by mouth daily. doTerra Women -  Bone Nutireint    . OVER THE COUNTER MEDICATION daily. Mega Q Plus Max - Dr. Talmage Coin 's - Daily    . pantoprazole (PROTONIX) 40 MG tablet TAKE 1 TABLET(40 MG) BY MOUTH DAILY BEFORE BREAKFAST 90 tablet 0  . Probiotic Product (PROBIOTIC DAILY PO) Take by mouth daily.    . ramipril (ALTACE) 5 MG capsule TAKE 1 CAPSULE(5 MG) BY MOUTH DAILY 90 capsule 1  .  Simethicone (GAS-X PO) Take by mouth. Per Patient she takes a couple (2) a day.     Social History   Socioeconomic History  . Marital status: Married    Spouse name: [name]   . Number of children: 3  . Years of education: 59  . Highest education level: 12th grade  Occupational History  . Occupation: retired   Tobacco Use  . Smoking status: Never Smoker  . Smokeless tobacco: Never Used  Substance and Sexual Activity  . Alcohol use: No    Alcohol/week: 0.0 standard drinks  . Drug use: No  . Sexual activity: Not Currently  Other Topics Concern  . 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 Difficulty of Paying Living Expenses:   Food Insecurity:   . Worried About Charity fundraiser in the Last Year:   . Arboriculturist in the Last Year:   Transportation Needs:   . Film/video editor (Medical):   [name] Lack of Transportation (Non-Medical):   Physical Activity:   . Days of Exercise per Week:   . Minutes of Exercise per Session:   Stress:   . Feeling of Stress :   Social Connections:   . Frequency of Communication with Friends and Family:   . Frequency of Social Gatherings with Friends and Family:   . Attends Religious Services:   . Active Member of Clubs or Organizations:   . Attends Archivist Meetings:   [name] Marital Status:   Intimate Partner Violence:   . Fear of Current or Ex-Partner:   . Emotionally Abused:   [name] Physically Abused:   . Sexually Abused:    Family History  Problem Relation Age of Onset  . Pancreatic cancer Sister   . Colon cancer Paternal Aunt   . Colon polyps Neg Hx     OBJECTIVE:  Vitals:   05/20/20 0826  BP: (!) 168/89  Pulse: 83  Resp: 17  Temp: 98 F (36.7 C)  TempSrc: Tympanic  SpO2: 95%   General appearance: Alert; NAD HEENT: NCAT.  Oropharynx clear.  Lungs: clear to auscultation bilaterally without adventitious breath sounds Heart: regular rate and rhythm.   Abdomen: soft; non-distended; no tenderness; bowel sounds present; no guarding Back: no CVA tenderness Extremities: no edema; symmetrical with no gross deformities Skin: warm and dry; erythematous excoriations to lateral LT ankle Neurologic: Ambulates from chair to exam table without difficulty Psychological: alert and cooperative; normal mood and affect  Labs Reviewed  POCT URINALYSIS DIP (MANUAL ENTRY) - Abnormal; Notable for the following components:      Result Value   Blood, UA trace-intact (*)    Nitrite, UA Positive (*)    Leukocytes, UA Trace (*)    All other components within normal  limits  URINE CULTURE    ASSESSMENT & PLAN:  1. Urinary frequency   2. Acute cystitis with hematuria   3. Rash and nonspecific skin eruption     Meds ordered this encounter  Medications  . cephALEXin (KEFLEX) 500 MG capsule    Sig: Take 1 capsule (500 mg total) by mouth 2 (two) times daily for 10 days.    Dispense:  20 capsule    Refill:  0    Order Specific Question:   Supervising Provider    Answer:   Raylene Everts [id number]  . phenazopyridine (  PYRIDIUM) 200 MG tablet    Sig: Take 1 tablet (200 mg total) by mouth 3 (three) times daily.    Dispense:  6 tablet    Refill:  0    Order Specific Question:   Supervising Provider    Answer:   Raylene Everts [id number]  . triamcinolone cream (KENALOG) 0.1 %    Sig: Apply 1 application topically 2 (two) times daily.    Dispense:  30 g    Refill:  0    Order Specific Question:   Supervising Provider    Answer:   Raylene Everts [id number]   Urine concerning for UTI Urine culture sent.  We will call you with abnormal results.   Push fluids and get plenty of rest.   Take antibiotic as directed and to completion Take pyridium as prescribed and as needed for symptomatic relief Follow up with PCP if symptoms persists Return here or go to ER if you have any new or worsening symptoms such as fever, worsening abdominal pain, nausea/vomiting, flank pain, etc...  Triamcinolone sent in for rash  Outlined signs and symptoms indicating need for more acute intervention. Patient verbalized understanding. After Visit Summary given.     Stacey Drain [place], PA-C 05/20/20 [phone]

## 2020-05-20 NOTE — ED Triage Notes (Signed)
Pt seen by PCP for uti on Monday. Symptoms returned after completing antibiotics

## 2020-05-20 NOTE — Discharge Instructions (Signed)
Urine concerning for UTI Urine culture sent.  We will call you with abnormal results.   Push fluids and get plenty of rest.   Take antibiotic as directed and to completion Take pyridium as prescribed and as needed for symptomatic relief Follow up with PCP if symptoms persists Return here or go to ER if you have any new or worsening symptoms such as fever, worsening abdominal pain, nausea/vomiting, flank pain, etc... 

## 2020-05-22 LAB — URINE CULTURE
Culture: >=100000 — AB
Special Requests: NORMAL

## 2020-05-23 ENCOUNTER — Telehealth (HOSPITAL_COMMUNITY): Payer: Self-pay

## 2020-05-23 MED ORDER — NITROFURANTOIN MONOHYD MACRO 100 MG PO CAPS: 100.0000 mg | ORAL_CAPSULE | Freq: Two times a day (BID) | ORAL | 0 refills | Status: DC

## 2020-05-30 ENCOUNTER — Encounter: Payer: Self-pay | Admitting: Family Medicine

## 2020-05-30 ENCOUNTER — Other Ambulatory Visit: Payer: Self-pay

## 2020-05-30 ENCOUNTER — Ambulatory Visit (INDEPENDENT_AMBULATORY_CARE_PROVIDER_SITE_OTHER): Payer: PPO | Admitting: Family Medicine

## 2020-05-30 VITALS — BP 120/78 | HR 85 | Temp 97.4°F | Resp 15 | Ht 65.5 in | Wt 129.0 lb

## 2020-05-30 DIAGNOSIS — I10 Essential (primary) hypertension: Secondary | ICD-10-CM

## 2020-05-30 DIAGNOSIS — N390 Urinary tract infection, site not specified: Secondary | ICD-10-CM | POA: Diagnosis not present

## 2020-05-30 DIAGNOSIS — K219 Gastro-esophageal reflux disease without esophagitis: Secondary | ICD-10-CM | POA: Diagnosis not present

## 2020-05-30 NOTE — Patient Instructions (Addendum)
Annual physical exam end October or after, call if you need me before  New antibiotic is prescribed which will treat your bl;adder infection, take for 5 days, and ensure you drink 64 ounces water daily and empty often  Wellness due please schedule   Thanks for choosing [facility], we consider it a privelige to serve you. HAPPY BIRTHDAY, many more!!  Benadryl oTC will herlp with sl;eep, if getting hangover or very drowsy , no good, don't want you to fall!

## 2020-05-31 ENCOUNTER — Other Ambulatory Visit: Payer: Self-pay

## 2020-05-31 ENCOUNTER — Other Ambulatory Visit: Payer: Self-pay | Admitting: Family Medicine

## 2020-05-31 MED ORDER — NITROFURANTOIN MONOHYD MACRO 100 MG PO CAPS: 100.0000 mg | ORAL_CAPSULE | Freq: Two times a day (BID) | ORAL | 0 refills | Status: DC

## 2020-05-31 NOTE — Progress Notes (Signed)
Nitrofurantoin 100

## 2020-06-01 ENCOUNTER — Encounter: Payer: Self-pay | Admitting: Family Medicine

## 2020-06-01 NOTE — Assessment & Plan Note (Signed)
Controlled, no change in medication  

## 2020-06-01 NOTE — Progress Notes (Signed)
   Kelsey Nelson     MRN: [id number]      DOB: Aug 23, 1934   HPI [name] is here with  A 2 week h/o dysuria and frequency , worse at night. Urgent care prescribed keflex, however , bacteria is resistant, nitrofurantoin had been prescribed several days later by UC however pt was unaware she reports , and has never started this. Denies flank pain, fever or chills. Recently celebrated 28 years and continues  To walk regularly, and is looking into volunteer services with hospice, which is wonderful ROS Denies recent fever or chills. Denies sinus pressure, nasal congestion, ear pain or sore throat. Denies chest congestion, productive cough or wheezing. Denies chest pains, palpitations and leg swelling Denies abdominal pain, nausea, vomiting,diarrhea or constipation.   . Denies joint pain, swelling and limitation in mobility. Denies headaches, seizures, numbness, or tingling. Denies depression, anxiety , chronic  insomnia. Denies skin break down or rash.   PE  BP 120/78   Pulse 85   Temp (!) 97.4 F (36.3 C) (Temporal)   Resp 15   Ht 5' 5.5" (1.664 m)   Wt 129 lb (58.5 kg)   SpO2 95%   BMI 21.14 kg/m   Patient alert and oriented and in no cardiopulmonary distress.  HEENT: No facial asymmetry, EOMI,     Neck supple .  Chest: Clear to auscultation bilaterally.  CVS: S1, S2 no murmurs, no S3.Regular rate.  ABD: Soft non tender.   Ext: No edema, no suprapubic or flank tenderness MS: Adequate ROM spine, shoulders, hips and knees.  Skin: Intact, no ulcerations or rash noted.  Psych: Good eye contact, normal affect. Memory intact not anxious or depressed appearing.  CNS: CN 2-12 intact, power,  normal throughout.no focal deficits noted.   Assessment & Plan Urinary tract infection without hematuria Evaluated in [facility] on 5/29, started on keflex which is resistant, nitrofurantoin will treat, script sent in and pt educated re need to increase water intake, void often and complete  new antibiotic course  Essential hypertension Controlled, no change in medication   GERD Controlled, no change in medication

## 2020-06-01 NOTE — Assessment & Plan Note (Signed)
Evaluated in [place] on 5/29, started on keflex which is resistant, nitrofurantoin will treat, script sent in and pt educated re need to increase water intake, void often and complete new antibiotic course

## 2020-06-12 DIAGNOSIS — Z4420 Encounter for fitting and adjustment of artificial eye, unspecified: Secondary | ICD-10-CM | POA: Diagnosis not present

## 2020-06-19 ENCOUNTER — Telehealth: Payer: Self-pay

## 2020-06-19 ENCOUNTER — Other Ambulatory Visit: Payer: Self-pay | Admitting: *Deleted

## 2020-06-19 MED ORDER — RAMIPRIL 5 MG PO CAPS: ORAL_CAPSULE | ORAL | 1 refills | Status: DC

## 2020-06-19 NOTE — Telephone Encounter (Signed)
Medication sent to [facility]

## 2020-06-19 NOTE — Telephone Encounter (Signed)
Patient came in office today and asked for [place] to call in Ramipril 5 mg to [facility].  She said she is out.

## 2020-06-22 ENCOUNTER — Ambulatory Visit
Admission: EM | Admit: 2020-06-22 | Discharge: 2020-06-22 | Disposition: A | Discharge status: Home / Self Care | Payer: PPO | Attending: Emergency Medicine | Admitting: Emergency Medicine

## 2020-06-22 DIAGNOSIS — R350 Frequency of micturition: Secondary | ICD-10-CM | POA: Diagnosis not present

## 2020-06-22 DIAGNOSIS — H00012 Hordeolum externum right lower eyelid: Secondary | ICD-10-CM | POA: Insufficient documentation

## 2020-06-22 LAB — POCT URINALYSIS DIP (MANUAL ENTRY)
Bilirubin, UA: NEGATIVE
Blood, UA: NEGATIVE
Glucose, UA: NEGATIVE mg/dL
Ketones, POC UA: NEGATIVE mg/dL
Nitrite, UA: NEGATIVE
Protein Ur, POC: NEGATIVE mg/dL
Spec Grav, UA: 1.015 (ref 1.010–1.025)
Urobilinogen, UA: 0.2 E.U./dL
pH, UA: 7 (ref 5.0–8.0)

## 2020-06-22 MED ORDER — SULFAMETHOXAZOLE-TRIMETHOPRIM 800-160 MG PO TABS: 1.0000 | ORAL_TABLET | Freq: Two times a day (BID) | ORAL | 0 refills | Status: AC

## 2020-06-22 MED ORDER — ERYTHROMYCIN 5 MG/GM OP OINT: TOPICAL_OINTMENT | OPHTHALMIC | 0 refills | Status: DC

## 2020-06-22 NOTE — ED Triage Notes (Signed)
Pt presents with dysuria for past few days and eye drainage

## 2020-06-22 NOTE — ED Provider Notes (Signed)
[facility]   CC: Urinary frequency; stye  SUBJECTIVE:  Kelsey Nelson is a 86 y.o. female who complains of urinary frequency x 2 weeks.  Patient denies a precipitating event, delayed bathroom breaks, recent sexual encounter, excessive caffeine intake.  Denies pain.  Denies alleviating or aggravating factors.  Admits to recent UTI.  Denies fever, chills, nausea, vomiting, abdominal pain, flank pain, abnormal vaginal discharge or bleeding, hematuria.    Also reports RT eye drainage and possible stye x 2 weeks.  Denies trauma, or precipitating event.  Has prosethetic RT eye.  Denies alleviating factors.  Worse in the morning.  Denies similar symptoms.  Denies eye discomfort, FB sensation, periorbital erythema.    LMP: No LMP recorded. Patient has had a hysterectomy.  ROS: As in HPI.  All other pertinent ROS negative.     Past Medical History:  Diagnosis Date  . Blindness of right eye   . BMI between 19-24,adult 2010 145 LBS  . Diverticula of small intestine Dec 2010  . Diverticulosis    [place]  . Functional constipation 2010  . GERD (gastroesophageal reflux disease)   . HTN (hypertension)   . Hyperlipemia   . Right eye trauma 1949   Loss of vision resulted at age 4  . Schatzki's ring Dec 2010   Past Surgical History:  Procedure Laterality Date  . ABDOMINAL HYSTERECTOMY    . BREAST REDUCTION SURGERY  1992 BIL  . COLONOSCOPY  DEC 2010   SIMPLE ADENOMAS (6-8),  TICS, SML IH  . COLONOSCOPY N/A 12/08/2013   Procedure: COLONOSCOPY;  Surgeon: Rogene Houston, MD;  Location: [facility];  Service: Endoscopy;  Laterality: N/A;  225  . EVISCERATION Right 05/08/2015   Procedure: EVISCERATION WITH IMPLANT RIGHT EYE;  Surgeon: Williams Che, MD;  Location: [facility];  Service: Ophthalmology;  Laterality: Right;  . EYE SURGERY  RIGHT 2005  . EYE SURGERY  right 2016   removed and false eye placed in 08/2015  . TOTAL ABDOMINAL HYSTERECTOMY W/ BILATERAL SALPINGOOPHORECTOMY  1985  FIBROIDS  . UPPER GASTROINTESTINAL ENDOSCOPY  DEC 2010   Bx-MILD GASTRITIS/DUODENITIS, DUODENAL DIVERTICULA, SCHATZKI'S RING   Allergies  Allergen Reactions  . Codeine   . Fosamax [Alendronate Sodium]     heartburn   Current Facility-Administered Medications on File Prior to Encounter  Medication Dose Route Frequency Provider Last Rate Last Admin  . hyoscyamine (LEVSIN SL) SL tablet 0.125 mg  0.125 mg Sublingual Q6H PRN Minus Liberty, PA-C       Current Outpatient Medications on File Prior to Encounter  Medication Sig Dispense Refill  . Calcium Carbonate Antacid (TUMS PO) Take by mouth. Per patient she takes as needed for acid reflux , heart burn. Maybe 2 a day.    . Cholecalciferol (VITAMIN D3 PO) Take 25 mcg by mouth.    . esomeprazole (NEXIUM) 40 MG capsule TAKE 1 CAPSULE(40 MG) BY MOUTH DAILY BEFORE BREAKFAST 30 capsule 0  . Magnesium 400 MG CAPS Take 400 mg by mouth daily.    . Multiple Vitamin (MULTI-VITAMIN DAILY PO) Take by mouth daily. doTerra Women -  Bone Nutireint    . OVER THE COUNTER MEDICATION daily. Mega Q Plus Max - Dr. Talmage Coin 's - Daily    . pantoprazole (PROTONIX) 40 MG tablet TAKE 1 TABLET(40 MG) BY MOUTH DAILY BEFORE BREAKFAST 90 tablet 0  . Probiotic Product (PROBIOTIC DAILY PO) Take by mouth daily.    . ramipril (ALTACE) 5 MG capsule TAKE  1 CAPSULE(5 MG) BY MOUTH DAILY 90 capsule 1  . Simethicone (GAS-X PO) Take by mouth. Per Patient she takes a couple (2) a day.    . triamcinolone cream (KENALOG) 0.1 % Apply 1 application topically 2 (two) times daily. 30 g 0   Social History   Socioeconomic History  . Marital status: Married    Spouse name: [name]   . Number of children: 3  . Years of education: 14  . Highest education level: 12th grade  Occupational History  . Occupation: retired   Tobacco Use  . Smoking status: Never Smoker  . Smokeless tobacco: Never Used  Vaping Use  . Vaping Use: Never used  Substance and Sexual Activity  . Alcohol use: No     Alcohol/week: 0.0 standard drinks  . Drug use: No  . Sexual activity: Not Currently  Other Topics Concern  . Not on file  Social History Narrative   MARRIED TO DAVID Sciascia. 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 Difficulty of Paying Living Expenses:   Food Insecurity:   . Worried About Charity fundraiser in the Last Year:   . Arboriculturist in the Last Year:   Transportation Needs:   . Film/video editor (Medical):   [name] Lack of Transportation (Non-Medical):   Physical Activity:   . Days of Exercise per Week:   . Minutes of Exercise per Session:   Stress:   . Feeling of Stress :   Social Connections:   . Frequency of Communication with Friends and Family:   . Frequency of Social Gatherings with Friends and Family:   . Attends Religious Services:   . Active Member of Clubs or Organizations:   . Attends Archivist Meetings:   [name] Marital Status:   Intimate Partner Violence:   . Fear of Current or Ex-Partner:   . Emotionally Abused:   [name] Physically Abused:   . Sexually Abused:    Family History  Problem Relation Age of Onset  . Pancreatic cancer Sister   . Colon cancer Paternal Aunt   . Colon polyps Neg Hx     OBJECTIVE:  Vitals:   06/22/20 1051  BP: (!) 162/83  Pulse: 81  Resp: 17  Temp: 98.4 F (36.9 C)  TempSrc: Oral  SpO2: 95%   General appearance: Alert in no acute distress HEENT: NCAT.  EOMI grossly, RT lower eye lid with stye, no obvious drainage, no conjunctival erythema; Oropharynx clear.  Lungs: clear to auscultation bilaterally without adventitious breath sounds Heart: regular rate and rhythm.   Abdomen: soft; non-distended; no tenderness; bowel sounds present; no guarding Back: no CVA tenderness Extremities: no edema; symmetrical with no gross deformities Skin: warm and dry Neurologic: Ambulates from chair to exam table without difficulty Psychological: alert and cooperative;  normal mood and affect  Labs Reviewed  POCT URINALYSIS DIP (MANUAL ENTRY) - Abnormal; Notable for the following components:      Result Value   Leukocytes, UA Trace (*)    All other components within normal limits  URINE CULTURE    ASSESSMENT & PLAN:  1. Urinary frequency   2. Hordeolum externum of right lower eyelid     Meds ordered this encounter  Medications  . sulfamethoxazole-trimethoprim (BACTRIM DS) 800-160 MG tablet    Sig: Take 1 tablet by mouth 2 (two) times daily for 10 days.    Dispense:  20 tablet  Refill:  0    Order Specific Question:   Supervising Provider    Answer:   Raylene Everts [[id number]]  . erythromycin ophthalmic ointment    Sig: Place a 1/2 inch ribbon of ointment into the lower eyelid.    Dispense:  3.5 g    Refill:  0    Order Specific Question:   Supervising Provider    Answer:   Raylene Everts [[id number]]   UTI: Given current symptoms and hx of recent UTI will cover Urine culture sent.  We will call you with abnormal results.   Bactrim prescribed.  Take as directed and to completion Push fluids and get plenty of rest.   Follow up with PCP if symptoms persists Return here or go to ER if you have any new or worsening symptoms such as fever, worsening abdominal pain, nausea/vomiting, flank pain, etc...  Stye:  Continue warm compresses at home.  Soak a wash cloth in warm (not scalding) water and place it over the eyes. As the wash cloth cools, it should be rewarmed and replaced for a total of 5 to 10 minutes of soaking time. Warm compresses should be applied two to four times a day as long as the patient has symptoms Perform lid washing: Either warm water or very dilute baby shampoo can be placed on a clean wash cloth, gauze pad, or cotton swab. Then be advised to gently clean along the lashes and lid margin to remove the accumulated material with care to avoid contacting the ocular surface. If shampoo is used, thorough rinsing is recommended.  Vigorous washing should be avoided, as it may cause more irritation.  Prescribed erythromycin ointment.  Apply up to 6 times daily for 5-7 days, or until symptomatic improvement Follow up with opthalmology for further evaluation and management Go to ER if you have pain, swelling, redness, discharge, vision changes, etc...  Outlined signs and symptoms indicating need for more acute intervention. Patient verbalized understanding. After Visit Summary given.     Lestine Box, PA-C 06/22/20 1142

## 2020-06-22 NOTE — Discharge Instructions (Signed)
UTI: Given current symptoms and hx of recent UTI will cover Urine culture sent.  We will call you with abnormal results.   Bactrim prescribed.  Take as directed and to completion Push fluids and get plenty of rest.   Follow up with PCP if symptoms persists Return here or go to ER if you have any new or worsening symptoms such as fever, worsening abdominal pain, nausea/vomiting, flank pain, etc...  Stye:  Continue warm compresses at home.  Soak a wash cloth in warm (not scalding) water and place it over the eyes. As the wash cloth cools, it should be rewarmed and replaced for a total of 5 to 10 minutes of soaking time. Warm compresses should be applied two to four times a day as long as the patient has symptoms Perform lid washing: Either warm water or very dilute baby shampoo can be placed on a clean wash cloth, gauze pad, or cotton swab. Then be advised to gently clean along the lashes and lid margin to remove the accumulated material with care to avoid contacting the ocular surface. If shampoo is used, thorough rinsing is recommended. Vigorous washing should be avoided, as it may cause more irritation.  Prescribed erythromycin ointment.  Apply up to 6 times daily for 5-7 days, or until symptomatic improvement Follow up with opthalmology for further evaluation and management Go to ER if you have pain, swelling, redness, discharge, vision changes, etc..[name]

## 2020-06-24 LAB — URINE CULTURE: Culture: <10000 — AB

## 2020-07-11 DIAGNOSIS — Z4420 Encounter for fitting and adjustment of artificial eye, unspecified: Secondary | ICD-10-CM | POA: Diagnosis not present

## 2020-07-31 ENCOUNTER — Other Ambulatory Visit: Payer: Self-pay

## 2020-07-31 ENCOUNTER — Encounter: Payer: Self-pay | Admitting: Family Medicine

## 2020-07-31 ENCOUNTER — Ambulatory Visit (INDEPENDENT_AMBULATORY_CARE_PROVIDER_SITE_OTHER): Payer: PPO | Admitting: Family Medicine

## 2020-07-31 VITALS — BP 138/88 | HR 79 | Resp 16 | Ht 65.5 in | Wt 129.0 lb

## 2020-07-31 DIAGNOSIS — L309 Dermatitis, unspecified: Secondary | ICD-10-CM | POA: Diagnosis not present

## 2020-07-31 DIAGNOSIS — E559 Vitamin D deficiency, unspecified: Secondary | ICD-10-CM | POA: Diagnosis not present

## 2020-07-31 DIAGNOSIS — I10 Essential (primary) hypertension: Secondary | ICD-10-CM

## 2020-07-31 DIAGNOSIS — R351 Nocturia: Secondary | ICD-10-CM

## 2020-07-31 DIAGNOSIS — E785 Hyperlipidemia, unspecified: Secondary | ICD-10-CM | POA: Diagnosis not present

## 2020-07-31 DIAGNOSIS — F322 Major depressive disorder, single episode, severe without psychotic features: Secondary | ICD-10-CM

## 2020-07-31 LAB — POCT URINALYSIS DIP (CLINITEK)
Bilirubin, UA: NEGATIVE
Blood, UA: NEGATIVE
Glucose, UA: NEGATIVE mg/dL
Ketones, POC UA: NEGATIVE mg/dL
Leukocytes, UA: NEGATIVE
Nitrite, UA: NEGATIVE
POC PROTEIN,UA: NEGATIVE
Spec Grav, UA: 1.01 (ref 1.010–1.025)
Urobilinogen, UA: 0.2 E.U./dL
pH, UA: 7 (ref 5.0–8.0)

## 2020-07-31 MED ORDER — BETAMETHASONE DIPROPIONATE 0.05 % EX CREA: TOPICAL_CREAM | CUTANEOUS | 0 refills | Status: DC

## 2020-07-31 MED ORDER — MIRTAZAPINE 7.5 MG PO TABS: 7.5000 mg | ORAL_TABLET | Freq: Every day | ORAL | 3 refills | Status: DC

## 2020-07-31 NOTE — Assessment & Plan Note (Signed)
Controlled, no change in medication  

## 2020-07-31 NOTE — Progress Notes (Signed)
   Kelsey Nelson     MRN: [id number]      DOB: 10/30/1934   HPI Kelsey Nelson is here c/o poor sleep, fatigue and crying spells which have been happening for appromately 6 to 8 weeks and worsening .   C/o itchy rash on left leg, no response to topical ketoconazole The PT denies any adverse reactions to current medications since the last visit.     ROS Denies recent fever or chills. Denies sinus pressure, nasal congestion, ear pain or sore throat. Denies chest congestion, productive cough or wheezing. Denies chest pains, palpitations and leg swelling Denies abdominal pain, nausea, vomiting,diarrhea or constipation.   Denies dysuria, frequency, hesitancy or incontinence. Denies joint pain, swelling and limitation in mobility. Denies headaches, seizures, numbness, or tingling. Kelsey Nelson  PE  BP 138/88   Pulse 79   Resp 16   Ht 5' 5.5" (1.664 m)   Wt 129 lb (58.5 kg)   SpO2 96%   BMI 21.14 kg/m   Patient alert and oriented and in no cardiopulmonary distress.  HEENT: No facial asymmetry, EOMI,     Neck supple .  Chest: Clear to auscultation bilaterally.  CVS: S1, S2 no murmurs, no S3.Regular rate.  ABD: Soft non tender.   Ext: No edema  MS: Adequate ROM spine, shoulders, hips and knees.  Skin: Intact, erythematous  rash noted.on lateral aspect of left leg max diameter approx 4 in  Psych: Good eye contact, tearful at times, depressed appearing    CNS: CN 2-12 intact, power,  normal throughout.no focal deficits noted.   Assessment & Plan  Nocturia Excessive symptoms in past 4 to 6 weeks, no sleep, CCUA and c/s if abn  Depression, major, single episode, severe (HCC) Poor sleep and fatigue, start remeron 7.5 mg and re eval in 4 to 6 weeks  Essential hypertension Controlled, no change in medication   Dermatitis Betamethasone twice daily x 1 wek , then as needed

## 2020-07-31 NOTE — Assessment & Plan Note (Signed)
Poor sleep and fatigue, start remeron 7.5 mg and re eval in 4 to 6 weeks

## 2020-07-31 NOTE — Assessment & Plan Note (Signed)
Excessive symptoms in past 4 to 6 weeks, no sleep, CCUA and c/s if abn

## 2020-07-31 NOTE — Assessment & Plan Note (Signed)
Betamethasone twice daily x 1 wek , then as needed

## 2020-07-31 NOTE — Patient Instructions (Addendum)
F/U in 4 to 5 weeks, re evaluate depression and insomnia   New medication is Remeron 7.5 mg one at bedtime, this is not addictive and will treat both sleep and depression  New cream for rash on right leg is prescribed  Urine shows no infection and is normal, which is good  Please try to get all liquid in by 7 pm, and hopefullt with your new tablet , you wil be able to sleep throught the night like a baby!  Please get fasting cBC, lipid, cmp and EGFR , tSH and vit D one day this week if possible   Continue great habits of daily walking and daily good deed for someone else, those are priceless for your health!  Thanks for choosing [facility], we consider it a privelige to serve you.

## 2020-08-01 ENCOUNTER — Encounter: Payer: Self-pay | Admitting: Family Medicine

## 2020-08-04 DIAGNOSIS — I10 Essential (primary) hypertension: Secondary | ICD-10-CM | POA: Diagnosis not present

## 2020-08-04 DIAGNOSIS — E785 Hyperlipidemia, unspecified: Secondary | ICD-10-CM | POA: Diagnosis not present

## 2020-08-04 DIAGNOSIS — E559 Vitamin D deficiency, unspecified: Secondary | ICD-10-CM | POA: Diagnosis not present

## 2020-08-05 LAB — CMP14+EGFR
ALT: 18 IU/L (ref 0–32)
AST: 26 IU/L (ref 0–40)
Albumin/Globulin Ratio: 1.6 (ref 1.2–2.2)
Albumin: 4.3 g/dL (ref 3.6–4.6)
Alkaline Phosphatase: 127 IU/L — ABNORMAL HIGH (ref 48–121)
BUN/Creatinine Ratio: 16 (ref 12–28)
BUN: 15 mg/dL (ref 8–27)
Bilirubin Total: 0.4 mg/dL (ref 0.0–1.2)
CO2: 25 mmol/L (ref 20–29)
Calcium: 9.8 mg/dL (ref 8.7–10.3)
Chloride: 101 mmol/L (ref 96–106)
Creatinine, Ser: 0.96 mg/dL (ref 0.57–1.00)
GFR calc Af Amer: 63 mL/min/{1.73_m2} (ref >59)
GFR calc non Af Amer: 54 mL/min/{1.73_m2} — ABNORMAL LOW (ref >59)
Globulin, Total: 2.7 g/dL (ref 1.5–4.5)
Glucose: 96 mg/dL (ref 65–99)
Potassium: 4.4 mmol/L (ref 3.5–5.2)
Sodium: 141 mmol/L (ref 134–144)
Total Protein: 7 g/dL (ref 6.0–8.5)

## 2020-08-05 LAB — CBC
Hematocrit: 41.4 % (ref 34.0–46.6)
Hemoglobin: 14 g/dL (ref 11.1–15.9)
MCH: 30.4 pg (ref 26.6–33.0)
MCHC: 33.8 g/dL (ref 31.5–35.7)
MCV: 90 fL (ref 79–97)
Platelets: 297 10*3/uL (ref 150–450)
RBC: 4.61 x10E6/uL (ref 3.77–5.28)
RDW: 12.8 % (ref 11.7–15.4)
WBC: 5.2 10*3/uL (ref 3.4–10.8)

## 2020-08-05 LAB — LIPID PANEL
Chol/HDL Ratio: 3.1 ratio (ref 0.0–4.4)
Cholesterol, Total: 311 mg/dL — ABNORMAL HIGH (ref 100–199)
HDL: 99 mg/dL (ref >39)
LDL Chol Calc (NIH): 185 mg/dL — ABNORMAL HIGH (ref 0–99)
Triglycerides: 151 mg/dL — ABNORMAL HIGH (ref 0–149)
VLDL Cholesterol Cal: 27 mg/dL (ref 5–40)

## 2020-08-05 LAB — TSH: TSH: 2.81 u[IU]/mL (ref 0.450–4.500)

## 2020-08-05 LAB — VITAMIN D 25 HYDROXY (VIT D DEFICIENCY, FRACTURES): Vit D, 25-Hydroxy: 29.7 ng/mL — ABNORMAL LOW (ref 30.0–100.0)

## 2020-08-07 ENCOUNTER — Other Ambulatory Visit: Payer: Self-pay | Admitting: Family Medicine

## 2020-08-07 MED ORDER — ROSUVASTATIN CALCIUM 5 MG PO TABS: 5.0000 mg | ORAL_TABLET | Freq: Every day | ORAL | 5 refills | Status: DC

## 2020-08-17 ENCOUNTER — Ambulatory Visit: Payer: PPO | Admitting: Family Medicine

## 2020-08-17 ENCOUNTER — Telehealth: Payer: Self-pay

## 2020-08-17 NOTE — Telephone Encounter (Signed)
PT LVM that she was suppose to call back regarding the medication   She is Relaxed, but still not sleeping, and she doubled the medication and she is out--Only sleeping 3 hours a night

## 2020-08-18 ENCOUNTER — Other Ambulatory Visit: Payer: Self-pay | Admitting: Family Medicine

## 2020-08-18 MED ORDER — MIRTAZAPINE 15 MG PO TABS: 15.0000 mg | ORAL_TABLET | Freq: Every day | ORAL | 2 refills | Status: DC

## 2020-08-18 NOTE — Telephone Encounter (Signed)
Patient aware of and in agreement with treatment plan

## 2020-08-18 NOTE — Telephone Encounter (Signed)
Left message for patient to call back for treatment plan

## 2020-08-18 NOTE — Telephone Encounter (Signed)
Pls let pt know I have sent in the new higher dose of remeron 15 mg one at bedtime. I recommend  she take OTC benadryl 25 mg one at bedtime to help  her to sleep as well, take this with the remeron. Glad she is " relaxed" sleep will follow

## 2020-08-22 ENCOUNTER — Ambulatory Visit (INDEPENDENT_AMBULATORY_CARE_PROVIDER_SITE_OTHER): Payer: PPO | Admitting: Family Medicine

## 2020-08-22 ENCOUNTER — Encounter: Payer: Self-pay | Admitting: Family Medicine

## 2020-08-22 ENCOUNTER — Other Ambulatory Visit: Payer: Self-pay

## 2020-08-22 ENCOUNTER — Other Ambulatory Visit (HOSPITAL_COMMUNITY)
Admission: AD | Admit: 2020-08-22 | Discharge: 2020-08-22 | Disposition: A | Discharge status: Home / Self Care | Payer: PPO | Source: Skilled Nursing Facility | Attending: Family Medicine | Admitting: Family Medicine

## 2020-08-22 VITALS — BP 124/80 | HR 88 | Resp 16 | Ht 65.5 in | Wt 130.1 lb

## 2020-08-22 DIAGNOSIS — N3001 Acute cystitis with hematuria: Secondary | ICD-10-CM | POA: Insufficient documentation

## 2020-08-22 DIAGNOSIS — R300 Dysuria: Secondary | ICD-10-CM

## 2020-08-22 LAB — POCT URINALYSIS DIP (CLINITEK)
Bilirubin, UA: NEGATIVE
Glucose, UA: NEGATIVE mg/dL
Ketones, POC UA: NEGATIVE mg/dL
Nitrite, UA: POSITIVE — AB
POC PROTEIN,UA: NEGATIVE
Spec Grav, UA: 1.02 (ref 1.010–1.025)
Urobilinogen, UA: 0.2 E.U./dL
pH, UA: 7 (ref 5.0–8.0)

## 2020-08-22 MED ORDER — CEPHALEXIN 500 MG PO CAPS: 500.0000 mg | ORAL_CAPSULE | Freq: Two times a day (BID) | ORAL | 0 refills | Status: AC

## 2020-08-22 MED ORDER — PHENAZOPYRIDINE HCL 200 MG PO TABS: 200.0000 mg | ORAL_TABLET | Freq: Three times a day (TID) | ORAL | 0 refills | Status: DC | PRN

## 2020-08-22 NOTE — Assessment & Plan Note (Signed)
Urinary tract infection present.  Pyridium provided to help with symptoms.

## 2020-08-22 NOTE — Assessment & Plan Note (Signed)
Ongoing urinary tract infection like symptoms since May.  Referral to urology secondary to ongoing symptoms.  Culture sent out UA in office positive UTI. We will treat again with Keflex and Pyridium.  Advised that she might have to change treatment measures based off of culture patient in agreements with this.

## 2020-08-22 NOTE — Patient Instructions (Signed)
I appreciate the opportunity to provide you with care for your health and wellness. Today we discussed: UTI  Follow up: 09/12/2020 as scheduled   No labs  Referrals today: urologist due to chronic UTI  Sorry you are not feeling well today. I hope the medication we started you on today will help. We will call you once we get the Culture back.  Please continue to practice social distancing to keep you, your family, and our community safe.  If you must go out, please wear a mask and practice good handwashing.  It was a pleasure to see you and I look forward to continuing to work together on your health and well-being. Please do not hesitate to call the office if you need care or have questions about your care.  Have a wonderful day and week. With Gratitude, Cherly Beach, DNP, AGNP-BC

## 2020-08-22 NOTE — Progress Notes (Signed)
Subjective:  Patient ID: Kelsey Nelson, female    DOB: 06/11/1934  Age: 86 y.o. MRN: [id number]  CC:  Chief Complaint  Patient presents with  . burning with urination    x 4 mths      HPI  HPI Kelsey Nelson is a 86 year old female patient who comes in today complaining of dysuria with burning with urination.  Reports that she has been having this issue on and off for 4 months.  She denies any precipitating event.  Denies delete bathroom breaks or trouble starting stream.  Denies having any recent sexual encounters.  Denies excessive caffeine intake.  Reports pain in the lower abdomen/burning in addition to the dysuria.  Denies having any aggravating or relieving factors.  Recent ongoing urinary tract infection since May.  Some relief intermittently with antibiotic use.  Denies having any fevers, chills, nausea, vomiting, flank pain, abnormal vaginal discharge or bleeding.  Or frank hematuria.  History of hysterectomy.  Has not had any bladder issues since then.  Is open to referral for urology for better evaluation secondary to ongoing symptoms.  Today patient denies signs and symptoms of COVID 19 infection including fever, chills, cough, shortness of breath, and headache. Past Medical, Surgical, Social History, Allergies, and Medications have been Reviewed.   Past Medical History:  Diagnosis Date  . Allergic rhinitis 11/08/2017  . Blindness of right eye   . BMI between 19-24,adult 2010 145 LBS  . Diverticula of small intestine Dec 2010  . Diverticulosis    [place]  . DIVERTICULOSIS, SIGMOID COLON 03/31/2008   Qualifier: Diagnosis of  By: Dierdre Harness  Noted on abdominal and pelvic scan in 04/2011.  Extensive colonic diverticulosis , and possible muscular hypertrophy at rectosigmoid junction   . Family hx of colon cancer 11/09/2013  . Functional constipation 2010  . GERD (gastroesophageal reflux disease)   . HTN (hypertension)   . Hyperlipemia   . Pancreatic cyst 02/05/2013  . Renal  cyst 05/27/2011  . Right eye trauma 1949   Loss of vision resulted at age 65  . Schatzki's ring Dec 2010  . Skin lesions, generalized 06/19/2015  . Tubular adenoma 11/09/2013    Current Meds  Medication Sig  . betamethasone dipropionate 0.05 % cream Apply twice daily to rash on right leg for 10 days, then as needed  . Calcium Carbonate Antacid (TUMS PO) Take by mouth. Per patient she takes as needed for acid reflux , heart burn. Maybe 2 a day.  . Cholecalciferol (VITAMIN D3 PO) Take 25 mcg by mouth.  . mirtazapine (REMERON) 15 MG tablet Take 1 tablet (15 mg total) by mouth at bedtime.  . Multiple Vitamin (MULTI-VITAMIN DAILY PO) Take by mouth daily. doTerra Women -  Bone Nutireint  . OVER THE COUNTER MEDICATION daily. Mega Q Plus Max - Dr. Talmage Coin 's - Daily  . Probiotic Product (PROBIOTIC DAILY PO) Take by mouth daily.  . ramipril (ALTACE) 5 MG capsule TAKE 1 CAPSULE(5 MG) BY MOUTH DAILY  . rosuvastatin (CRESTOR) 5 MG tablet Take 1 tablet (5 mg total) by mouth daily.  . Simethicone (GAS-X PO) Take by mouth. Per Patient she takes a couple (2) a day.  . triamcinolone cream (KENALOG) 0.1 % Apply 1 application topically 2 (two) times daily.   Current Facility-Administered Medications for the 08/22/20 encounter (Office Visit) with Perlie Mayo, NP  Medication  . hyoscyamine (LEVSIN SL) SL tablet 0.125 mg    ROS:  Review of Systems  Constitutional: Negative.   HENT: Negative.   Eyes: Negative.   Respiratory: Negative.   Cardiovascular: Negative.   Gastrointestinal: Negative.   Genitourinary: Negative.   Musculoskeletal: Negative.   Skin: Negative.   Neurological: Negative.   Endo/Heme/Allergies: Negative.   Psychiatric/Behavioral: Negative.      Objective:   Today's Vitals: BP 124/80   Pulse 88   Resp 16   Ht 5' 5.5" (1.664 m)   Wt 130 lb 1.9 oz (59 kg)   SpO2 95%   BMI 21.32 kg/m  Vitals with BMI 08/22/2020 07/31/2020 07/31/2020  Height 5' 5.5" - 5' 5.5"  Weight 130 lbs  2 oz - 129 lbs  BMI 63.78 - 58.85  Systolic 027 741 287  Diastolic 80 88 81  Pulse 88 - 79     Physical Exam Vitals and nursing note reviewed.  Constitutional:      Appearance: Normal appearance. She is well-developed and well-groomed. She is obese.  HENT:     Head: Normocephalic and atraumatic.     Right Ear: External ear normal.     Left Ear: External ear normal.     Mouth/Throat:     Comments: Mask in place Eyes:     General:        Right eye: No discharge.        Left eye: No discharge.     Conjunctiva/sclera: Conjunctivae normal.  Cardiovascular:     Rate and Rhythm: Normal rate and regular rhythm.     Pulses: Normal pulses.     Heart sounds: Normal heart sounds.  Pulmonary:     Effort: Pulmonary effort is normal.     Breath sounds: Normal breath sounds.  Abdominal:     Tenderness: There is no right CVA tenderness or left CVA tenderness.  Musculoskeletal:        General: Normal range of motion.     Cervical back: Normal range of motion and neck supple.  Skin:    General: Skin is warm.  Neurological:     General: No focal deficit present.     Mental Status: She is alert and oriented to person, place, and time.  Psychiatric:        Attention and Perception: Attention normal.        Mood and Affect: Mood normal.        Speech: Speech normal.        Behavior: Behavior normal. Behavior is cooperative.        Thought Content: Thought content normal.        Cognition and Memory: Cognition normal.        Judgment: Judgment normal.      Assessment   1. Burning with urination   2. Acute cystitis with hematuria     Tests ordered Orders Placed This Encounter  Procedures  . Urine Culture  . Ambulatory referral to Urology  . POCT URINALYSIS DIP (CLINITEK)     Plan: Please see assessment and plan per problem list above.   Meds ordered this encounter  Medications  . cephALEXin (KEFLEX) 500 MG capsule    Sig: Take 1 capsule (500 mg total) by mouth 2 (two)  times daily for 10 days.    Dispense:  20 capsule    Refill:  0    Order Specific Question:   Supervising Provider    Answer:   SIMPSON, MARGARET E [[id number]]  . phenazopyridine (PYRIDIUM) 200 MG tablet    Sig: Take 1 tablet (200 mg total) by  mouth 3 (three) times daily as needed for pain.    Dispense:  6 tablet    Refill:  0    Order Specific Question:   Supervising Provider    Answer:   Jacklynn Bue    Patient to follow-up in 09/12/2020  Perlie Mayo, NP

## 2020-08-25 DIAGNOSIS — H40052 Ocular hypertension, left eye: Secondary | ICD-10-CM | POA: Diagnosis not present

## 2020-08-25 DIAGNOSIS — H2512 Age-related nuclear cataract, left eye: Secondary | ICD-10-CM | POA: Diagnosis not present

## 2020-08-25 LAB — URINE CULTURE: Culture: >=100000 — AB

## 2020-08-31 ENCOUNTER — Other Ambulatory Visit: Payer: Self-pay | Admitting: Family Medicine

## 2020-08-31 DIAGNOSIS — N3001 Acute cystitis with hematuria: Secondary | ICD-10-CM

## 2020-09-07 ENCOUNTER — Telehealth: Payer: Self-pay

## 2020-09-07 ENCOUNTER — Other Ambulatory Visit: Payer: Self-pay | Admitting: Family Medicine

## 2020-09-07 MED ORDER — AMPICILLIN 500 MG PO CAPS: 500.0000 mg | ORAL_CAPSULE | Freq: Three times a day (TID) | ORAL | 0 refills | Status: DC

## 2020-09-07 NOTE — Telephone Encounter (Signed)
Pls have her submit urine for c/s only , and I am prescribing a 5 day antibiotic course, tell her I am sorry she is having all this problem, Urology evaluation definitely needed

## 2020-09-07 NOTE — Telephone Encounter (Signed)
Patient came into the office requesting an antibiotic for UTI symptoms. Has appt with urology on 10/14. Lovena Le trying to get patient a sooner appt

## 2020-09-12 ENCOUNTER — Telehealth: Payer: Self-pay | Admitting: Urology

## 2020-09-12 ENCOUNTER — Ambulatory Visit (INDEPENDENT_AMBULATORY_CARE_PROVIDER_SITE_OTHER): Payer: PPO | Admitting: Family Medicine

## 2020-09-12 ENCOUNTER — Encounter: Payer: Self-pay | Admitting: Family Medicine

## 2020-09-12 ENCOUNTER — Other Ambulatory Visit: Payer: Self-pay

## 2020-09-12 VITALS — BP 148/93 | HR 75 | Resp 16 | Ht 66.0 in | Wt 130.0 lb

## 2020-09-12 DIAGNOSIS — H2000 Unspecified acute and subacute iridocyclitis: Secondary | ICD-10-CM | POA: Diagnosis not present

## 2020-09-12 DIAGNOSIS — N3000 Acute cystitis without hematuria: Secondary | ICD-10-CM | POA: Diagnosis not present

## 2020-09-12 DIAGNOSIS — Z23 Encounter for immunization: Secondary | ICD-10-CM | POA: Diagnosis not present

## 2020-09-12 DIAGNOSIS — F322 Major depressive disorder, single episode, severe without psychotic features: Secondary | ICD-10-CM

## 2020-09-12 DIAGNOSIS — I10 Essential (primary) hypertension: Secondary | ICD-10-CM | POA: Diagnosis not present

## 2020-09-12 DIAGNOSIS — R351 Nocturia: Secondary | ICD-10-CM

## 2020-09-12 MED ORDER — IMIPRAMINE HCL 10 MG PO TABS: 10.0000 mg | ORAL_TABLET | Freq: Every day | ORAL | 3 refills | Status: DC

## 2020-09-12 MED ORDER — RAMIPRIL 10 MG PO CAPS: 10.0000 mg | ORAL_CAPSULE | Freq: Every day | ORAL | 3 refills | Status: DC

## 2020-09-12 NOTE — Patient Instructions (Addendum)
Keep appointment form annual exam as before  Increase dose of altace to 10 mg one daily, please collect and start tomorrow.  New for sleep and to help with urinary frequrency at night is imipramine  Flu  Vaccine  Return urine as soon as possible for culture only, may be today  Thankful appetite and energy have improved  Goal is for sooner urology appointment , we will be iin touch

## 2020-09-12 NOTE — Telephone Encounter (Signed)
Patients PCP called and stated she saw pt today in the office and would like to get her in sooner to see urology.Pt has appt on 10/14

## 2020-09-13 ENCOUNTER — Other Ambulatory Visit (HOSPITAL_COMMUNITY)
Admission: RE | Admit: 2020-09-13 | Discharge: 2020-09-13 | Disposition: A | Discharge status: Home / Self Care | Payer: PPO | Source: Other Acute Inpatient Hospital | Attending: Family Medicine | Admitting: Family Medicine

## 2020-09-13 ENCOUNTER — Telehealth: Payer: Self-pay | Admitting: Radiology

## 2020-09-13 DIAGNOSIS — N3000 Acute cystitis without hematuria: Secondary | ICD-10-CM | POA: Diagnosis not present

## 2020-09-13 NOTE — Telephone Encounter (Signed)
Called pt and PCP yesterday around 4pm.  Left message with PCP And no answer at pt number

## 2020-09-13 NOTE — Telephone Encounter (Signed)
Dr Alyson Ingles office left message yesteday for Kelsey Nelson patient was to be worked in today but they were unable to reach her by phone and wanted to see if we could help.  Kelsey Nelson out of office  They were holding the appointment but could not get answer when they called    To Kelsey Nelson FYI when she returns, they did try to get her in sooner

## 2020-09-14 LAB — URINE CULTURE: Culture: <10000 — AB

## 2020-09-15 NOTE — Telephone Encounter (Signed)
noted 

## 2020-09-16 ENCOUNTER — Encounter: Payer: Self-pay | Admitting: Family Medicine

## 2020-09-16 DIAGNOSIS — N3000 Acute cystitis without hematuria: Secondary | ICD-10-CM | POA: Insufficient documentation

## 2020-09-16 NOTE — Assessment & Plan Note (Signed)
Start imipramine to help with sleep and nocturia

## 2020-09-16 NOTE — Progress Notes (Signed)
   Kelsey Nelson     MRN: [id number]      DOB: 12-07-1934   HPI Kelsey Nelson is here for follow up and re-evaluation of chronic medical conditions, medication management and review of any available recent lab and radiology data.  Preventive health is updated, specifically  Immunization.   Still awaiting Urology evaluation, and has been plagued with recurrent cystitis, sometimes infected, since past 5 months, and is currently symptomatic with frequency, urgency and mild dysuria, denies fever, chills or flank pain Sleep has improved , bu is still limited to a max of 5 hours Reports marked improvement in depression   ROS Denies recent fever or chills. Denies sinus pressure, nasal congestion, ear pain or sore throat. Denies chest congestion, productive cough or wheezing. Denies chest pains, palpitations and leg swelling Denies abdominal pain, nausea, vomiting,diarrhea or constipation.    Denies joint pain, swelling and limitation in mobility. Denies headaches, seizures, numbness, or tingling.  Denies skin break down or rash.   PE  BP (!) 148/93   Pulse 75   Resp 16   Ht 5\' 6"  (1.676 m)   Wt 130 lb (59 kg)   SpO2 95%   BMI 20.98 kg/m   Patient alert and oriented and in no cardiopulmonary distress.  HEENT: No facial asymmetry, EOMI,     Neck supple .  Chest: Clear to auscultation bilaterally.  CVS: S1, S2 no murmurs, no S3.Regular rate.  ABD: Soft non tender. No renal angle or suprapubic tenderness  Ext: No edema  MS: Adequate ROM spine, shoulders, hips and knees.  Skin: Intact, no ulcerations or rash noted.  Psych: Good eye contact, normal affect. Memory intact not anxious or depressed appearing.  CNS: CN 2-12 intact, power,  normal throughout.no focal deficits noted.   Assessment & Plan  Essential hypertension Uncontrolled , increase dose of altace and re evaluate  Depression, major, single episode, severe (HCC) Marked improvement on current medication , continue  same  Nocturia Start imipramine to help with sleep and nocturia  Acute cystitis Recurrent cystitis symptoms x 5 months, with and without infection, send for c/s and try to et sooner appt with Urology, imipramine added for nocturia

## 2020-09-16 NOTE — Assessment & Plan Note (Signed)
Uncontrolled , increase dose of altace and re evaluate

## 2020-09-16 NOTE — Assessment & Plan Note (Signed)
Marked improvement on current medication , continue same 

## 2020-09-16 NOTE — Assessment & Plan Note (Signed)
Recurrent cystitis symptoms x 5 months, with and without infection, send for c/s and try to et sooner appt with Urology, imipramine added for nocturia

## 2020-10-05 ENCOUNTER — Encounter: Payer: Self-pay | Admitting: Urology

## 2020-10-05 ENCOUNTER — Ambulatory Visit (INDEPENDENT_AMBULATORY_CARE_PROVIDER_SITE_OTHER): Payer: PPO | Admitting: Urology

## 2020-10-05 ENCOUNTER — Other Ambulatory Visit: Payer: Self-pay

## 2020-10-05 VITALS — BP 168/93 | HR 99 | Temp 98.3°F | Ht 66.0 in | Wt 130.0 lb

## 2020-10-05 DIAGNOSIS — N3001 Acute cystitis with hematuria: Secondary | ICD-10-CM

## 2020-10-05 LAB — URINALYSIS, ROUTINE W REFLEX MICROSCOPIC
Bilirubin, UA: NEGATIVE
Glucose, UA: NEGATIVE
Nitrite, UA: POSITIVE — AB
Protein,UA: NEGATIVE
Specific Gravity, UA: 1.02 (ref 1.005–1.030)
Urobilinogen, Ur: 0.2 mg/dL (ref 0.2–1.0)
pH, UA: 7 (ref 5.0–7.5)

## 2020-10-05 LAB — MICROSCOPIC EXAMINATION: Renal Epithel, UA: NONE SEEN /hpf

## 2020-10-05 LAB — BLADDER SCAN AMB NON-IMAGING: Scan Result: 90

## 2020-10-05 MED ORDER — CEFUROXIME AXETIL 250 MG PO TABS: 250.0000 mg | ORAL_TABLET | Freq: Two times a day (BID) | ORAL | 0 refills | Status: DC

## 2020-10-05 MED ORDER — FLUCONAZOLE 150 MG PO TABS: 150.0000 mg | ORAL_TABLET | Freq: Once | ORAL | 0 refills | Status: AC

## 2020-10-05 NOTE — Progress Notes (Signed)
10/05/2020 11:29 AM   Kelsey Nelson December 17, 1934 [id number]  Referring provider: Fayrene Helper, MD [address], [place],  [place] [address]  dysuria  HPI: Ms Camberos is an 86yo here for evaluation of recurrent UTI. Starting 7 months ago she developed dysuria, urinary frequency and urgency. She was diagnosed with a UTI and since then she has been treated 8 times for a UTI. Urine culture have been positive multiple times over the past 7 months. UA today is concerning for infection. She has dysuria, nocturia 3-5x, urinary frequency every 2-3 hours, urinary urgency. No urge incontinence. PVR 90cc. NO hx of UTI. Hysterectomy and oophrectomy in 1985. No hx of breast cancer. She was treated with keflex and bactrim.   PMH: Past Medical History:  Diagnosis Date  . Allergic rhinitis 11/08/2017  . Blindness of right eye   . BMI between 19-24,adult 2010 145 LBS  . Diverticula of small intestine Dec 2010  . Diverticulosis    [place]  . DIVERTICULOSIS, SIGMOID COLON 03/31/2008   Qualifier: Diagnosis of  By: Dierdre Harness  Noted on abdominal and pelvic scan in 04/2011.  Extensive colonic diverticulosis , and possible muscular hypertrophy at rectosigmoid junction   . Family hx of colon cancer 11/09/2013  . Functional constipation 2010  . GERD (gastroesophageal reflux disease)   . HTN (hypertension)   . Hyperlipemia   . Pancreatic cyst 02/05/2013  . Renal cyst 05/27/2011  . Right eye trauma 1949   Loss of vision resulted at age 22  . Schatzki's ring Dec 2010  . Skin lesions, generalized 06/19/2015  . Tubular adenoma 11/09/2013    Surgical History: Past Surgical History:  Procedure Laterality Date  . ABDOMINAL HYSTERECTOMY    . BREAST REDUCTION SURGERY  1992 BIL  . COLONOSCOPY  DEC 2010   SIMPLE ADENOMAS (6-8), [place] TICS, SML IH  . COLONOSCOPY N/A 12/08/2013   Procedure: COLONOSCOPY;  Surgeon: Rogene Houston, MD;  Location: [facility];  Service: Endoscopy;  Laterality: N/A;  225    . EVISCERATION Right 05/08/2015   Procedure: EVISCERATION WITH IMPLANT RIGHT EYE;  Surgeon: Williams Che, MD;  Location: [facility];  Service: Ophthalmology;  Laterality: Right;  . EYE SURGERY  RIGHT 2005  . EYE SURGERY  right 2016   removed and false eye placed in 08/2015  . TOTAL ABDOMINAL HYSTERECTOMY W/ BILATERAL SALPINGOOPHORECTOMY  1985 FIBROIDS  . UPPER GASTROINTESTINAL ENDOSCOPY  DEC 2010   Bx-MILD GASTRITIS/DUODENITIS, DUODENAL DIVERTICULA, SCHATZKI'S RING    Home Medications:  Allergies as of 10/05/2020      Reactions   Codeine    Fosamax [alendronate Sodium]    heartburn      Medication List       Accurate as of October 05, 2020 11:29 AM. If you have any questions, ask your nurse or doctor.        betamethasone dipropionate 0.05 % cream Apply twice daily to rash on right leg for 10 days, then as needed   GAS-X PO Take by mouth. Per Patient she takes a couple (2) a day.   imipramine 10 MG tablet Commonly known as: TOFRANIL Take 1 tablet (10 mg total) by mouth at bedtime.   Lumigan 0.01 % Soln Generic drug: bimatoprost Place 1 drop into the left eye at bedtime.   mirtazapine 15 MG tablet Commonly known as: Remeron Take 1 tablet (15 mg total) by mouth at bedtime.   MULTI-VITAMIN DAILY PO Take by mouth daily. Derek Jack  Women -  Bone Nutireint   OMEGA-3 PLUS PO Take by mouth.   OVER THE COUNTER MEDICATION daily. Mega Q Plus Max - Dr. Talmage Coin 's - Daily   pantoprazole 40 MG tablet Commonly known as: PROTONIX TAKE 1 TABLET(40 MG) BY MOUTH DAILY BEFORE BREAKFAST   phenazopyridine 200 MG tablet Commonly known as: Pyridium Take 1 tablet (200 mg total) by mouth 3 (three) times daily as needed for pain.   PROBIOTIC DAILY PO Take by mouth daily.   ramipril 10 MG capsule Commonly known as: ALTACE Take 1 capsule (10 mg total) by mouth daily.   rosuvastatin 5 MG tablet Commonly known as: Crestor Take 1 tablet (5 mg total) by mouth daily.    triamcinolone cream 0.1 % Commonly known as: KENALOG Apply 1 application topically 2 (two) times daily.   TUMS PO Take by mouth. Per patient she takes as needed for acid reflux , heart burn. Maybe 2 a day.   VITAMIN D3 PO Take 25 mcg by mouth.       Allergies:  Allergies  Allergen Reactions  . Codeine   . Fosamax [Alendronate Sodium]     heartburn    Family History: Family History  Problem Relation Age of Onset  . Pancreatic cancer Sister   . Colon cancer Paternal Aunt   . Colon polyps Neg Hx     Social History:  reports that she has never smoked. She has never used smokeless tobacco. She reports that she does not drink alcohol and does not use drugs.  ROS: All other review of systems were reviewed and are negative except what is noted above in HPI  Physical Exam: BP (!) 168/93   Pulse 99   Temp 98.3 F (36.8 C)   Ht 5\' 6"  (1.676 m)   Wt 130 lb (59 kg)   BMI 20.98 kg/m   Constitutional:  Alert and oriented, No acute distress. HEENT: [place] AT, moist mucus membranes.  Trachea midline, no masses. Cardiovascular: No clubbing, cyanosis, or edema. Respiratory: Normal respiratory effort, no increased work of breathing. GI: Abdomen is soft, nontender, nondistended, no abdominal masses GU: No CVA tenderness.  Lymph: No cervical or inguinal lymphadenopathy. Skin: No rashes, bruises or suspicious lesions. Neurologic: Grossly intact, no focal deficits, moving all 4 extremities. Psychiatric: Normal mood and affect.  Laboratory Data: Lab Results  Component Value Date   WBC 5.2 08/04/2020   HGB 14.0 08/04/2020   HCT 41.4 08/04/2020   MCV 90 08/04/2020   PLT 297 08/04/2020    Lab Results  Component Value Date   CREATININE 0.96 08/04/2020    No results found for: PSA  No results found for: TESTOSTERONE  Lab Results  Component Value Date   HGBA1C 5.6 09/03/2016    Urinalysis    Component Value Date/Time   COLORURINE STRAW (A) 12/04/2018 1058    APPEARANCEUR CLEAR 12/04/2018 1058   LABSPEC 1.003 (L) 12/04/2018 1058   PHURINE 7.0 12/04/2018 1058   GLUCOSEU NEGATIVE 12/04/2018 1058   HGBUR SMALL (A) 12/04/2018 1058   BILIRUBINUR negative 08/22/2020 1101   BILIRUBINUR neg 05/15/2020 1354   KETONESUR negative 08/22/2020 1101   KETONESUR NEGATIVE 12/04/2018 1058   PROTEINUR negative 06/22/2020 1105   PROTEINUR Negative 05/15/2020 1354   PROTEINUR NEGATIVE 12/04/2018 1058   UROBILINOGEN 0.2 08/22/2020 1101   UROBILINOGEN 0.2 07/14/2009 2018   NITRITE Positive (A) 08/22/2020 1101   NITRITE positive 05/15/2020 1354   NITRITE NEGATIVE 12/04/2018 1058   LEUKOCYTESUR Small (1+) (A)  08/22/2020 1101    Lab Results  Component Value Date   BACTERIA NONE SEEN 12/04/2018    Pertinent Imaging:  Results for orders placed during the hospital encounter of 07/31/12  DG Abd 1 View  Narrative *RADIOLOGY REPORT*  Clinical Data: History of constipation.  Abdominal pain.  ABDOMEN - 1 VIEW  Comparison: 06/21/2011  Findings: Moderate stool burden within the colon, slightly decreased in the left colon since prior study.  No evidence of bowel obstruction.  No free air.  No organomegaly or acute bony abnormality.  Calcified phleboliths in the anatomic pelvis.  IMPRESSION: Moderate stool burden, decreased from prior study.  No acute findings.  Original Report Authenticated By: Raelyn Number, M.D.  No results found for this or any previous visit.  No results found for this or any previous visit.  No results found for this or any previous visit.  No results found for this or any previous visit.  No results found for this or any previous visit.  No results found for this or any previous visit.  No results found for this or any previous visit.   Assessment & Plan:    1. Acute cystitis with hematuria -urine for culture -We will start ceftin 250mg  BID for 7 days --We discussed the natural hx of recurrent UTIs and the various  causes. We discussed the treatment options including post coital prophylaxis, daily prophylaxis, topical estrogen therapy. - Urinalysis, Routine w reflex microscopic - BLADDER SCAN AMB NON-IMAGING   No follow-ups on file.  Nicolette Bang, MD  [facility] [place]

## 2020-10-05 NOTE — Patient Instructions (Signed)
Conjugated Estrogens vaginal cream What is this medicine? CONJUGATED ESTROGENS (CON ju gate ed ESS troe jenz) are a mixture of female hormones. This cream can help relieve symptoms associated with menopause.like vaginal dryness and irritation. This medicine may be used for other purposes; ask your health care provider or pharmacist if you have questions. COMMON BRAND NAME(S): Premarin What should I tell my health care provider before I take this medicine? They need to know if you have any of these conditions:  abnormal vaginal bleeding  blood vessel disease or blood clots  breast, cervical, endometrial, or uterine cancer  dementia  diabetes  gallbladder disease  heart disease or recent heart attack  high blood pressure  high cholesterol  high level of calcium in the blood  hysterectomy  kidney disease  liver disease  migraine headaches  protein C deficiency  protein S deficiency  stroke  systemic lupus erythematosus (SLE)  tobacco smoker  an unusual or allergic reaction to estrogens other medicines, foods, dyes, or preservatives  pregnant or trying to get pregnant  breast-feeding How should I use this medicine? This medicine is for use in the vagina only. Do not take by mouth. Follow the directions on the prescription label. Use at bedtime unless otherwise directed by your doctor or health care professional. Use the special applicator supplied with the cream. Wash hands before and after use. Fill the applicator with the cream and remove from the tube. Lie on your back, part and bend your knees. Insert the applicator into the vagina and push the plunger to expel the cream into the vagina. Wash the applicator with warm soapy water and rinse well. Use exactly as directed for the complete length of time prescribed. Do not stop using except on the advice of your doctor or health care professional. Talk to your pediatrician regarding the use of this medicine in  children. Special care may be needed. A patient package insert for the product will be given with each prescription and refill. Read this sheet carefully each time. The sheet may change frequently. Overdosage: If you think you have taken too much of this medicine contact a poison control center or emergency room at once. NOTE: This medicine is only for you. Do not share this medicine with others. What if I miss a dose? If you miss a dose, use it as soon as you can. If it is almost time for your next dose, use only that dose. Do not use double or extra doses. What may interact with this medicine? Do not take this medicine with any of the following medications:  aromatase inhibitors like aminoglutethimide, anastrozole, exemestane, letrozole, testolactone This medicine may also interact with the following medications:  barbiturates used for inducing sleep or treating seizures  carbamazepine  grapefruit juice  medicines for fungal infections like itraconazole and ketoconazole  raloxifene or tamoxifen  rifabutin  rifampin  rifapentine  ritonavir  some antibiotics used to treat infections  St. John's Wort  warfarin This list may not describe all possible interactions. Give your health care provider a list of all the medicines, herbs, non-prescription drugs, or dietary supplements you use. Also tell them if you smoke, drink alcohol, or use illegal drugs. Some items may interact with your medicine. What should I watch for while using this medicine? Visit your health care professional for regular checks on your progress. You will need a regular breast and pelvic exam. You should also discuss the need for regular mammograms with your health care professional, and  follow his or her guidelines. °This medicine can make your body retain fluid, making your fingers, hands, or ankles swell. Your blood pressure can go up. Contact your doctor or health care professional if you feel you are  retaining fluid. °If you have any reason to think you are pregnant; stop taking this medicine at once and contact your doctor or health care professional. °Tobacco smoking increases the risk of getting a blood clot or having a stroke, especially if you are more than 84 years old. You are strongly advised not to smoke. °If you wear contact lenses and notice visual changes, or if the lenses begin to feel uncomfortable, consult your eye care specialist. °If you are going to have elective surgery, you may need to stop taking this medicine beforehand. Consult your health care professional for advice prior to scheduling the surgery. °What side effects may I notice from receiving this medicine? °Side effects that you should report to your doctor or health care professional as soon as possible: °· allergic reactions like skin rash, itching or hives, swelling of the face, lips, or tongue °· breast tissue changes or discharge °· changes in vision °· chest pain °· confusion, trouble speaking or understanding °· dark urine °· general ill feeling or flu-like symptoms °· light-colored stools °· nausea, vomiting °· pain, swelling, warmth in the leg °· right upper belly pain °· severe headaches °· shortness of breath °· sudden numbness or weakness of the face, arm or leg °· trouble walking, dizziness, loss of balance or coordination °· unusual vaginal bleeding °· yellowing of the eyes or skin °Side effects that usually do not require medical attention (report to your doctor or health care professional if they continue or are bothersome): °· hair loss °· increased hunger or thirst °· increased urination °· symptoms of vaginal infection like itching, irritation or unusual discharge °· unusually weak or tired °This list may not describe all possible side effects. Call your doctor for medical advice about side effects. You may report side effects to FDA at 1-800-FDA-1088. °Where should I keep my medicine? °Keep out of the reach of  children. °Store at room temperature between 15 and 30 degrees C (59 and 86 degrees F). Throw away any unused medicine after the expiration date. °NOTE: This sheet is a summary. It may not cover all possible information. If you have questions about this medicine, talk to your doctor, pharmacist, or health care provider. °© 2020 [employer]vier/Gold Standard (2011-03-13 09:20:36) ° °

## 2020-10-05 NOTE — Progress Notes (Signed)
Urological Symptom Review  Patient is experiencing the following symptoms: Frequent urination Burning/pain with urination Get up at night to urinate Stream starts and stops Trouble starting stream Urinary tract infection   Review of Systems  Gastrointestinal (upper)  : Negative for upper GI symptoms  Gastrointestinal (lower) : Negative for lower GI symptoms  Constitutional : Negative for symptoms  Skin: Negative for skin symptoms  Eyes: Negative for eye symptoms  Ear/Nose/Throat : Negative for Ear/Nose/Throat symptoms  Hematologic/Lymphatic: Negative for Hematologic/Lymphatic symptoms  Cardiovascular : Negative for cardiovascular symptoms  Respiratory : Negative for respiratory symptoms  Endocrine: Negative for endocrine symptoms  Musculoskeletal: Negative for musculoskeletal symptoms  Neurological: Negative for neurological symptoms  Psychologic: Negative for psychiatric symptoms

## 2020-10-06 DIAGNOSIS — H2512 Age-related nuclear cataract, left eye: Secondary | ICD-10-CM | POA: Diagnosis not present

## 2020-10-06 DIAGNOSIS — H40052 Ocular hypertension, left eye: Secondary | ICD-10-CM | POA: Diagnosis not present

## 2020-10-09 LAB — URINE CULTURE

## 2020-10-10 ENCOUNTER — Other Ambulatory Visit: Payer: Self-pay

## 2020-10-10 ENCOUNTER — Encounter: Payer: PPO | Admitting: Family Medicine

## 2020-10-10 ENCOUNTER — Telehealth: Payer: Self-pay

## 2020-10-10 DIAGNOSIS — N3001 Acute cystitis with hematuria: Secondary | ICD-10-CM

## 2020-10-10 MED ORDER — SULFAMETHOXAZOLE-TRIMETHOPRIM 800-160 MG PO TABS: 1.0000 | ORAL_TABLET | Freq: Two times a day (BID) | ORAL | 0 refills | Status: DC

## 2020-10-10 NOTE — Telephone Encounter (Signed)
Lft mes. For pt to call back to tell her rx sent.

## 2020-10-11 NOTE — Telephone Encounter (Signed)
-----   Message from Cleon Gustin, MD sent at 10/10/2020  9:27 AM EDT ----- Bactrim DS BID for 7 days ----- Message ----- From: Valentina Lucks, LPN Sent: [id number]  10:00 AM EDT To: Cleon Gustin, MD  Pls. Review.

## 2020-10-12 NOTE — Telephone Encounter (Signed)
-----   Message from Cleon Gustin, MD sent at 10/10/2020  9:27 AM EDT ----- Bactrim DS BID for 7 days ----- Message ----- From: Valentina Lucks, LPN Sent: [id number]  10:00 AM EDT To: Cleon Gustin, MD  Pls. Review.

## 2020-10-12 NOTE — Telephone Encounter (Signed)
Pt notified urine culture positive and Bactrim sent in.

## 2020-10-19 ENCOUNTER — Encounter: Payer: Self-pay | Admitting: Family Medicine

## 2020-10-19 ENCOUNTER — Ambulatory Visit (INDEPENDENT_AMBULATORY_CARE_PROVIDER_SITE_OTHER): Payer: PPO | Admitting: Family Medicine

## 2020-10-19 ENCOUNTER — Other Ambulatory Visit (HOSPITAL_COMMUNITY)
Admission: RE | Admit: 2020-10-19 | Discharge: 2020-10-19 | Disposition: A | Discharge status: Home / Self Care | Payer: PPO | Source: Skilled Nursing Facility | Attending: Family Medicine | Admitting: Family Medicine

## 2020-10-19 ENCOUNTER — Other Ambulatory Visit: Payer: Self-pay

## 2020-10-19 VITALS — BP 126/82 | HR 88 | Temp 98.4°F | Resp 18 | Ht 65.5 in | Wt 131.0 lb

## 2020-10-19 DIAGNOSIS — I459 Conduction disorder, unspecified: Secondary | ICD-10-CM | POA: Diagnosis not present

## 2020-10-19 DIAGNOSIS — K219 Gastro-esophageal reflux disease without esophagitis: Secondary | ICD-10-CM | POA: Diagnosis not present

## 2020-10-19 DIAGNOSIS — N3000 Acute cystitis without hematuria: Secondary | ICD-10-CM | POA: Diagnosis not present

## 2020-10-19 LAB — POCT URINALYSIS DIPSTICK
Bilirubin, UA: NEGATIVE
Glucose, UA: NEGATIVE
Ketones, UA: NEGATIVE
Nitrite, UA: NEGATIVE
Protein, UA: NEGATIVE
Spec Grav, UA: 1.015 (ref 1.010–1.025)
Urobilinogen, UA: 0.2 E.U./dL
pH, UA: 7 (ref 5.0–8.0)

## 2020-10-19 MED ORDER — OMEPRAZOLE 20 MG PO CPDR: 20.0000 mg | DELAYED_RELEASE_CAPSULE | Freq: Every day | ORAL | 3 refills | Status: DC

## 2020-10-19 NOTE — Patient Instructions (Signed)
  HAPPY FALL!  I appreciate the opportunity to provide you with care for your health and wellness. Today we discussed:  Cream use  Follow up: 3-4 weeks   No labs or referrals today  Premarin Cream 1.0 g with applicator measures daily 1 week  If warm sensation is uncomfortable call me and we can reduce to 0.5 g daily 1 week  Then if your symptoms improve we will use 0.5 g on Mon Wed Fri use for 1-2 weeks  Then if doing well we will reduce to once weekly dose of 0.5 g   Please continue to practice social distancing to keep you, your family, and our community safe.  If you must go out, please wear a mask and practice good handwashing.  It was a pleasure to see you and I look forward to continuing to work together on your health and well-being. Please do not hesitate to call the office if you need care or have questions about your care.  Have a wonderful day and week. With Gratitude, Cherly Beach, DNP, AGNP-BC

## 2020-10-19 NOTE — Assessment & Plan Note (Signed)
Not controlled, start prilosec will monitor at future appts.  Reviewed side effects, risks and benefits of medication.   Patient acknowledged agreement and understanding of the plan.

## 2020-10-19 NOTE — Assessment & Plan Note (Signed)
Worried for questionable colonization and need for prophylaxis is estrogen does not improve S&S

## 2020-10-19 NOTE — Assessment & Plan Note (Addendum)
Noted skipped beats on exam.  Denies feeling skipped beats or S&S of rhythm changes EKG NSR at 90 bpm overall stable.

## 2020-10-19 NOTE — Progress Notes (Signed)
Subjective:  Patient ID: Kelsey Nelson, female    DOB: May 10, 1934  Age: 86 y.o. MRN: [id number]  CC:  Chief Complaint  Patient presents with  . Dysuria      HPI  HPI Kelsey Nelson is a 86 year old female patient who comes in today complaining of vaginal discomfort and burning of the skin.   Backstory: She has been having this issue on and off for 7 months.  She denies any precipitating event.  Denies delayed bathroom breaks or trouble starting stream.  Denies having any recent sexual encounters.  Denies excessive caffeine intake.  Recent ongoing urinary tract infection since May.  Some relief intermittently with antibiotic use.  Denies having any fevers, chills, nausea, vomiting, flank pain, abnormal vaginal discharge or bleeding.  Or frank hematuria.  History of hysterectomy.  Has not had any bladder issues since then.  I Referred her to Urology in Aug, she was just able to be see this month by them.   Per Urology Note:  recurrent UTI. Starting 7 months ago she developed dysuria, urinary frequency and urgency. She was diagnosed with a UTI and since then she has been treated 8 times for a UTI. Urine culture have been positive multiple times over the past 7 months. UA today is concerning for infection. She has dysuria, nocturia 3-5x, urinary frequency every 2-3 hours, urinary urgency. No urge incontinence. PVR 90cc. NO hx of UTI. Hysterectomy and oophrectomy in 1985. No hx of breast cancer. She was treated with keflex and bactrim.  At that appt she was txd with ceftin and bactrim. She was started on topical estrogen therapy- which she reports some improvement but has experienced "hot flashes in the lower pelvis" and has not been using the cream as usage suggest. She was provided with samples, but reports she just used her finger and was not using applicator to make the correct dosage about.   Education and demonstration today in the office.   She reports some nausea and heartburn but is not  on anything for this. Does not think it is related to food she had.  Could be side effect of estrogen use.   Today patient denies signs and symptoms of COVID 19 infection including fever, chills, cough, shortness of breath, and headache. Past Medical, Surgical, Social History, Allergies, and Medications have been Reviewed.   Past Medical History:  Diagnosis Date  . Allergic rhinitis 11/08/2017  . Blindness of right eye   . BMI between 19-24,adult 2010 145 LBS  . Diverticula of small intestine Dec 2010  . Diverticulosis    [place]  . DIVERTICULOSIS, SIGMOID COLON 03/31/2008   Qualifier: Diagnosis of  By: Dierdre Harness  Noted on abdominal and pelvic scan in 04/2011.  Extensive colonic diverticulosis , and possible muscular hypertrophy at rectosigmoid junction   . Family hx of colon cancer 11/09/2013  . Functional constipation 2010  . GERD (gastroesophageal reflux disease)   . HTN (hypertension)   . Hyperlipemia   . Pancreatic cyst 02/05/2013  . Renal cyst 05/27/2011  . Right eye trauma 1949   Loss of vision resulted at age 44  . Schatzki's ring Dec 2010  . Skin lesions, generalized 06/19/2015  . Tubular adenoma 11/09/2013    Current Meds  Medication Sig  . betamethasone dipropionate 0.05 % cream Apply twice daily to rash on right leg for 10 days, then as needed  . Calcium Carbonate Antacid (TUMS PO) Take by mouth. Per patient she takes as needed for  acid reflux , heart burn. Maybe 2 a day.  . cefUROXime (CEFTIN) 250 MG tablet Take 1 tablet (250 mg total) by mouth 2 (two) times daily with a meal.  . Cholecalciferol (VITAMIN D3 PO) Take 25 mcg by mouth.  [name] imipramine (TOFRANIL) 10 MG tablet Take 1 tablet (10 mg total) by mouth at bedtime.  [name] LUMIGAN 0.01 % SOLN Place 1 drop into the left eye at bedtime.  . mirtazapine (REMERON) 15 MG tablet Take 1 tablet (15 mg total) by mouth at bedtime.  . Multiple Vitamin (MULTI-VITAMIN DAILY PO) Take by mouth daily. doTerra Women -  Bone Nutireint  .  Omega-3 Fatty Acids (OMEGA-3 PLUS PO) Take by mouth.  [name] OVER THE COUNTER MEDICATION daily. Mega Q Plus Max - Dr. Talmage Coin 's - Daily  . phenazopyridine (PYRIDIUM) 200 MG tablet Take 1 tablet (200 mg total) by mouth 3 (three) times daily as needed for pain.  . Probiotic Product (PROBIOTIC DAILY PO) Take by mouth daily.  . ramipril (ALTACE) 10 MG capsule Take 1 capsule (10 mg total) by mouth daily.  . rosuvastatin (CRESTOR) 5 MG tablet Take 1 tablet (5 mg total) by mouth daily.  . Simethicone (GAS-X PO) Take by mouth. Per Patient she takes a couple (2) a day.  . sulfamethoxazole-trimethoprim (BACTRIM DS) 800-160 MG tablet Take 1 tablet by mouth every 12 (twelve) hours.  . triamcinolone cream (KENALOG) 0.1 % Apply 1 application topically 2 (two) times daily.  . [DISCONTINUED] pantoprazole (PROTONIX) 40 MG tablet TAKE 1 TABLET(40 MG) BY MOUTH DAILY BEFORE BREAKFAST   Current Facility-Administered Medications for the 10/19/20 encounter (Office Visit) with Perlie Mayo, NP  Medication  . hyoscyamine (LEVSIN SL) SL tablet 0.125 mg    ROS:  Review of Systems  Constitutional: Negative.   HENT: Negative.   Eyes: Negative.   Respiratory: Negative.   Cardiovascular: Negative.   Gastrointestinal: Positive for nausea.  Genitourinary: Negative.        See hpi  Musculoskeletal: Negative.   Skin: Negative.   Neurological: Negative.   Endo/Heme/Allergies: Negative.   Psychiatric/Behavioral: Negative.      Objective:   Today's Vitals: BP 126/82   Pulse 88   Temp 98.4 F (36.9 C)   Resp 18   Ht 5' 5.5" (1.664 m)   Wt 131 lb (59.4 kg)   SpO2 96%   BMI 21.47 kg/m  Vitals with BMI 10/19/2020 10/05/2020 09/12/2020  Height 5' 5.5" 5\' 6"  5\' 6"   Weight 131 lbs 130 lbs 130 lbs  BMI 21.46 20.35 59.74  Systolic 163 845 364  Diastolic 82 93 93  Pulse 88 99 75     Physical Exam Vitals and nursing note reviewed.  Constitutional:      Appearance: Normal appearance. She is well-developed,  well-groomed and normal weight.  HENT:     Head: Normocephalic and atraumatic.     Right Ear: External ear normal.     Left Ear: External ear normal.     Mouth/Throat:     Comments: Mask in place Eyes:     General:        Right eye: No discharge.        Left eye: No discharge.     Conjunctiva/sclera: Conjunctivae normal.  Cardiovascular:     Rate and Rhythm: Normal rate and regular rhythm. Occasional extrasystoles are present.    Pulses: Normal pulses.     Heart sounds: Normal heart sounds.  Pulmonary:     Effort: Pulmonary effort  is normal.     Breath sounds: Normal breath sounds.  Musculoskeletal:        General: Normal range of motion.     Cervical back: Normal range of motion and neck supple.  Skin:    General: Skin is warm.  Neurological:     General: No focal deficit present.     Mental Status: She is alert and oriented to person, place, and time.  Psychiatric:        Attention and Perception: Attention normal.        Mood and Affect: Mood normal.        Speech: Speech normal.        Behavior: Behavior normal. Behavior is cooperative.        Thought Content: Thought content normal.        Cognition and Memory: Cognition normal.        Judgment: Judgment normal.    EKG: Personally reviewed by me.  Normal sinus rhythm 90 bpm.  Possible left atrial enlargement overall stable EKG.  Assessment   1. Acute cystitis without hematuria   2. Gastroesophageal reflux disease, unspecified whether esophagitis present   3. Skipped heart beats     Tests ordered Orders Placed This Encounter  Procedures  . Urine Culture  . POCT Urinalysis Dipstick     Plan: Please see assessment and plan per problem list above.   Meds ordered this encounter  Medications  . omeprazole (PRILOSEC) 20 MG capsule    Sig: Take 1 capsule (20 mg total) by mouth daily.    Dispense:  30 capsule    Refill:  3    Order Specific Question:   Supervising Provider    Answer:   Jacklynn Bue    Patient to follow-up in 10/24/2020   Note: This dictation was prepared with Dragon dictation along with smaller phrase technology. Similar sounding words can be transcribed inadequately or may not be corrected upon review. Any transcriptional errors that result from this process are unintentional.      Perlie Mayo, NP

## 2020-10-20 ENCOUNTER — Telehealth: Payer: Self-pay

## 2020-10-20 NOTE — Telephone Encounter (Signed)
Pt informed

## 2020-10-20 NOTE — Telephone Encounter (Signed)
Pt called stating that the pharmacy never received her abt yesterday for her uti and she is still having a lot of discomfort. Please advise.

## 2020-10-20 NOTE — Telephone Encounter (Signed)
I need to get the Cx back first as she has had so many Antibx recently I do not want to prescribe until we know the bacterial. Please make sure she is using the cream as demonstrated and ask if she needs help with this.

## 2020-10-21 LAB — URINE CULTURE: Culture: <10000 — AB

## 2020-10-24 ENCOUNTER — Ambulatory Visit (INDEPENDENT_AMBULATORY_CARE_PROVIDER_SITE_OTHER): Payer: PPO | Admitting: Family Medicine

## 2020-10-24 ENCOUNTER — Encounter: Payer: Self-pay | Admitting: Family Medicine

## 2020-10-24 ENCOUNTER — Other Ambulatory Visit: Payer: Self-pay

## 2020-10-24 VITALS — BP 133/81 | HR 79 | Resp 16 | Ht 65.5 in | Wt 129.0 lb

## 2020-10-24 DIAGNOSIS — R102 Pelvic and perineal pain: Secondary | ICD-10-CM | POA: Diagnosis not present

## 2020-10-24 DIAGNOSIS — F322 Major depressive disorder, single episode, severe without psychotic features: Secondary | ICD-10-CM | POA: Diagnosis not present

## 2020-10-24 DIAGNOSIS — R300 Dysuria: Secondary | ICD-10-CM | POA: Diagnosis not present

## 2020-10-24 DIAGNOSIS — Z0001 Encounter for general adult medical examination with abnormal findings: Secondary | ICD-10-CM

## 2020-10-24 MED ORDER — ESTROGENS, CONJUGATED 0.625 MG/GM VA CREA: TOPICAL_CREAM | VAGINAL | 5 refills | Status: DC

## 2020-10-24 NOTE — Assessment & Plan Note (Signed)
Will check HSV 2 today Continue daily premarin cream with taper to lowest effective dose Provided with cup and order to submit urine for c/s only if she becomes symptomatic for uTI as in pain and frequency. Advised increase water geenerally, and if urine starts getting dark or odor ensure taking in adequate water

## 2020-10-24 NOTE — Assessment & Plan Note (Signed)

## 2020-10-24 NOTE — Assessment & Plan Note (Signed)
Not suicidal or homicidal. Wants to get urogenital complaint sorted out , no interest in medication for depression , has stopped what was prescribed  Though she states that it initially helped

## 2020-10-24 NOTE — Patient Instructions (Signed)
F/U in office with MD mid January, call iof you need me before  Keep appointments already in system  HSV 2 today  Urine culture to be ordered and specimen cup given for patient to submit if symptomatic for UTI  Thanks for choosing [facility], we consider it a privelige to serve you.

## 2020-10-24 NOTE — Progress Notes (Signed)
Kelsey Nelson     MRN: [id number]      DOB: February 29, 1934  HPI: Patient is in for annual physical exam. C/o feeling depressed and overwhelmed due to daily vaginal itching/ discomfort and recurrent UTI. Has had urology appt. Most recent urine culture is normal, reports some relief with topical estrogen Not consistently using Remeron or imipramine, no interest in resuming currently though insomnia and poor sleep persist. Reports excellent response top recently started pPI New is daily nausea x 1 week, which she herself attributes in part to stress and anxiety  Recent labs, are reviewed. Immunization is reviewed , and  Is up to date   PE: BP 133/81   Pulse 79   Resp 16   Ht 5' 5.5" (1.664 m)   Wt 129 lb (58.5 kg)   SpO2 98%   BMI 21.14 kg/m   Pleasant  female, alert and oriented x 3, in no cardio-pulmonary distress. Afebrile. HEENT No facial trauma or asymetry. Sinuses non tender.  Extra occullar muscles intact.. External ears normal, . Neck: supple, no adenopathy,JVD or thyromegaly.No bruits.  Chest: Clear to ascultation bilaterally.No crackles or wheezes. Non tender to palpation  Breast: Asymptomatic, not examined, mammogram to be scheduled  Cardiovascular system; Heart sounds normal,  S1 and  S2 ,no S3.  No murmur, or thrill. Apical beat not displaced Peripheral pulses normal.  Abdomen: Soft, non tender, no organomegaly or masses. No bruits. Bowel sounds normal. No guarding, tenderness or rebound.   GU: External genitalia normal female genitalia ,introitus and labia mildly erythematous, no ulcers noted externally, no inguinal lymphadenopathy Urethral meatus normal in size, no  Prolapse, no lesions visibly  Present. Bladder non tender. Vagina pink and moist , with no visible lesions , . Adequate pelvic support no  cystocele or rectocele noted Uterus absent ize, no adnexal masses, or adnexal tenderness.   Musculoskeletal exam: Full ROM of spine, hips ,  shoulders and knees. No deformity ,swelling or crepitus noted. No muscle wasting or atrophy.   Neurologic: Cranial nerves 2 to 12 intact. Power, tone ,sensation  normal throughout. No disturbance in gait. No tremor.  Skin: Intact, no ulceration,  noted. Pigmentation normal throughout  Psych; Mildly anxious and at times tearful states has had UTI for 6 months, with no relief, feels like " giving up at times' Not actively suicidal or homicidal. Judgement and concentration normal   Assessment & Plan:   Encounter for annual health examination Annual exam as documented. Counseling done  re healthy lifestyle involving commitment to 150 minutes exercise per week, heart healthy diet, and attaining healthy weight.The importance of adequate sleep also discussed. Regular seat belt use and home safety, is also discussed. Changes in health habits are decided on by the patient with goals and time frames  set for achieving them. Immunization and cancer screening needs are specifically addressed at this visit.   Depression, major, single episode, severe ([place]) Not suicidal or homicidal. Wants to get urogenital complaint sorted out , no interest in medication for depression , has stopped what was prescribed  Though she states that it initially helped  Burning with urination Will check HSV 2 today Continue daily premarin cream with taper to lowest effective dose Provided with cup and order to submit urine for c/s only if she becomes symptomatic for uTI as in pain and frequency. Advised increase water geenerally, and if urine starts getting dark or odor ensure taking in adequate water                                        +++++++++++ +++++++++++++++++++++++++++++++++++++++++++++++++++++++++++++++++++++++++++++++++++++++++++++++++++++++++  Kelsey Nelson     MRN: [id number]      DOB: [birth year]  HPI: Patient is in for annual physical exam. Ongoing internal  vaginal itching , stinging , burning, improved with premarin topically, completed 10 day antibiotic course from  Urology with negative culture , though symptomatic Nausea every morning, no vomit x 1 week   Nocturia every every hour  last weekImmunization is reviewed , and  updated if needed.   PE: Pleasant  female, alert and oriented x 3, in no cardio-pulmonary distress. Afebrile. HEENT No facial trauma or asymetry. Sinuses non tender.  Extra occullar muscles intact.. External ears normal, . Neck: supple, no adenopathy,JVD or thyromegaly.No bruits.  Chest: Clear to ascultation bilaterally.No crackles or wheezes. Non tender to palpation  Breast: No asymetry,no masses or lumps. No tenderness. No nipple discharge or inversion. No axillary or supraclavicular adenopathy  Cardiovascular system; Heart sounds normal,  S1 and  S2 ,no S3.  No murmur, or thrill. Apical beat not displaced Peripheral pulses normal.  Abdomen: Soft, non tender, no organomegaly or masses. No bruits. Bowel sounds normal. No guarding, tenderness or rebound.   GU: External genitalia normal female genitalia , normal female distribution of hair. No lesions. Urethral meatus normal in size, no  Prolapse, no lesions visibly  Present. Bladder non tender. Vagina pink and moist , with no visible lesions , discharge present . Adequate pelvic support no  cystocele or rectocele noted Cervix pink and appears healthy, no lesions or ulcerations noted, no discharge noted from os Uterus normal size, no adnexal masses, no cervical motion or adnexal tenderness.   Musculoskeletal exam: Full ROM of spine, hips , shoulders and knees. No deformity ,swelling or crepitus noted. No muscle wasting or atrophy.   Neurologic: Cranial nerves 2 to 12 intact. Power, tone ,sensation and reflexes normal throughout. No disturbance in gait. No tremor.  Skin: Intact, no ulceration, erythema , scaling or rash noted. Pigmentation  normal throughout  Psych; Normal mood and affect. Judgement and concentration normal   Assessment & Plan:  No problem-specific Assessment & Plan notes found for this encounter.

## 2020-10-26 LAB — HSV 2 ANTIBODY, IGG: HSV 2 IgG, Type Spec: <0.91 index (ref 0.00–0.90)

## 2020-11-17 ENCOUNTER — Ambulatory Visit: Payer: PPO | Admitting: Family Medicine

## 2020-11-22 DIAGNOSIS — H2512 Age-related nuclear cataract, left eye: Secondary | ICD-10-CM | POA: Diagnosis not present

## 2020-11-22 DIAGNOSIS — H40052 Ocular hypertension, left eye: Secondary | ICD-10-CM | POA: Diagnosis not present

## 2020-11-24 ENCOUNTER — Ambulatory Visit: Payer: PPO | Admitting: Family Medicine

## 2020-12-18 ENCOUNTER — Encounter: Payer: Self-pay | Admitting: Nurse Practitioner

## 2020-12-18 ENCOUNTER — Ambulatory Visit (INDEPENDENT_AMBULATORY_CARE_PROVIDER_SITE_OTHER): Payer: PPO | Admitting: Nurse Practitioner

## 2020-12-18 ENCOUNTER — Other Ambulatory Visit: Payer: Self-pay

## 2020-12-18 VITALS — BP 115/74 | HR 82 | Temp 98.4°F | Resp 20 | Ht 65.5 in | Wt 119.0 lb

## 2020-12-18 DIAGNOSIS — R197 Diarrhea, unspecified: Secondary | ICD-10-CM | POA: Diagnosis not present

## 2020-12-18 MED ORDER — LOPERAMIDE HCL 2 MG PO CAPS: 2.0000 mg | ORAL_CAPSULE | ORAL | 0 refills | Status: DC | PRN

## 2020-12-18 MED ORDER — ONDANSETRON 8 MG PO TBDP: 8.0000 mg | ORAL_TABLET | Freq: Three times a day (TID) | ORAL | 0 refills | Status: DC | PRN

## 2020-12-18 NOTE — Patient Instructions (Signed)
Please take imodium and ondansetron as prescribed. Ondansetron helps with nausea and vomiting Imodium helps with diarrhea. If the diarrhea persists despite imodium, or if abdominal pain develops, please come back to the clinic.

## 2020-12-18 NOTE — Assessment & Plan Note (Signed)
-  has had watery stools 3-4x per day for 5 days -had N/V originally, but vomiting has stopped -Rx. zofran PRN for N/V -Rx. Imodium for diarrhea -considered diverticulitis with mild LLQ tenderness on palpation, but she does not c/o of abdominal pain, so not likely diverticulitis -if pain gets worse may consider cipro/flagyl

## 2020-12-18 NOTE — Progress Notes (Signed)
Acute Office Visit  Subjective:    Patient ID: Kelsey Nelson, female    DOB: [birth year], 86 y.o.   MRN: [id number]  Chief Complaint  Patient presents with  . Diarrhea    X 5 days  . Emesis  . Nausea    HPI Patient is in today for N/V/D for the past 5 days.  Her biggest symptom is diarrhea.  She is having watery stools 3-4 times per day.  She last vomited Saturday night 12/16/20.  She has decreased appetite.   She denies fever, abdominal pain, melena, or BRBPR. She is able to drink water and ginger ale.  Past Medical History:  Diagnosis Date  . Allergic rhinitis 11/08/2017  . Blindness of right eye   . BMI between 19-24,adult 2010 145 LBS  . Diverticula of small intestine Dec 2010  . Diverticulosis    [place]  . DIVERTICULOSIS, SIGMOID COLON 03/31/2008   Qualifier: Diagnosis of  By: Lind Guest  Noted on abdominal and pelvic scan in 04/2011.  Extensive colonic diverticulosis , and possible muscular hypertrophy at rectosigmoid junction   . Family hx of colon cancer 11/09/2013  . Functional constipation 2010  . GERD (gastroesophageal reflux disease)   . HTN (hypertension)   . Hyperlipemia   . Pancreatic cyst 02/05/2013  . Renal cyst 05/27/2011  . Right eye trauma 1949   Loss of vision resulted at age 43  . Schatzki's ring Dec 2010  . Skin lesions, generalized 06/19/2015  . Tubular adenoma 11/09/2013    Past Surgical History:  Procedure Laterality Date  . ABDOMINAL HYSTERECTOMY    . BREAST REDUCTION SURGERY  1992 BIL  . COLONOSCOPY  DEC 2010   SIMPLE ADENOMAS (6-8), [place] TICS, S[facility] IH  . COLONOSCOPY N/A 12/08/2013   Procedure: COLONOSCOPY;  Surgeon: Malissa Hippo, MD;  Location: [facility];  Service: Endoscopy;  Laterality: N/A;  225  . EVISCERATION Right 05/08/2015   Procedure: EVISCERATION WITH IMPLANT RIGHT EYE;  Surgeon: Susa Simmonds, MD;  Location: [facility];  Service: Ophthalmology;  Laterality: Right;  . EYE SURGERY  RIGHT 2005  . EYE SURGERY  right 2016   removed  and false eye placed in 08/2015  . TOTAL ABDOMINAL HYSTERECTOMY W/ BILATERAL SALPINGOOPHORECTOMY  1985 FIBROIDS  . UPPER GASTROINTESTINAL ENDOSCOPY  DEC 2010   Bx-MILD GASTRITIS/DUODENITIS, DUODENAL DIVERTICULA, SCHATZKI'S RING    Family History  Problem Relation Age of Onset  . Pancreatic cancer Sister   . Colon cancer Paternal Aunt   . Colon polyps Neg Hx     Social History   Socioeconomic History  . Marital status: Married    Spouse name: Onalee Hua   . Number of children: 3  . Years of education: 4  . Highest education level: 12th grade  Occupational History  . Occupation: retired   Tobacco Use  . Smoking status: Never Smoker  . Smokeless tobacco: Never Used  Vaping Use  . Vaping Use: Never used  Substance and Sexual Activity  . Alcohol use: No    Alcohol/week: 0.0 standard drinks  . Drug use: No  . Sexual activity: Not Currently  Other Topics Concern  . Not on file  Social History Narrative   MARRIED TO DAVID Faulks. 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 Difficulty of Paying Living Expenses: Not very hard  Food Insecurity: No Food Insecurity  . Worried About Programme researcher, broadcasting/film/video in  the Last Year: Never true  . Ran Out of Food in the Last Year: Never true  Transportation Needs: No Transportation Needs  . Lack of Transportation (Medical): No  . Lack of Transportation (Non-Medical): No  Physical Activity: Sufficiently Active  . Days of Exercise per Week: 5 days  . Minutes of Exercise per Session: 60 min  Stress: Stress Concern Present  . Feeling of Stress : Rather much  Social Connections: Moderately Integrated  . Frequency of Communication with Friends and Family: Twice a week  . Frequency of Social Gatherings with Friends and Family: Three times a week  . Attends Religious Services: More than 4 times per year  . Active Member of Clubs or Organizations: No  . Attends Archivist Meetings:  Never  . Marital Status: Married  Human resources officer Violence: Not At Risk  . Fear of Current or Ex-Partner: No  . Emotionally Abused: No  . Physically Abused: No  . Sexually Abused: No    Outpatient Medications Prior to Visit  Medication Sig Dispense Refill  . betamethasone dipropionate 0.05 % cream Apply twice daily to rash on right leg for 10 days, then as needed 45 g 0  . Calcium Carbonate Antacid (TUMS PO) Take by mouth. Per patient she takes as needed for acid reflux , heart burn. Maybe 2 a day.    . Cholecalciferol (VITAMIN D3 PO) Take 25 mcg by mouth.    . conjugated estrogens (PREMARIN) vaginal cream Apply one fingertip to introitus every day 42.5 g 5  . imipramine (TOFRANIL) 10 MG tablet Take 1 tablet (10 mg total) by mouth at bedtime. 30 tablet 3  . LUMIGAN 0.01 % SOLN Place 1 drop into the left eye at bedtime.    . Multiple Vitamin (MULTI-VITAMIN DAILY PO) Take by mouth daily. doTerra Women -  Bone Nutireint    . Omega-3 Fatty Acids (OMEGA-3 PLUS PO) Take by mouth.    [name] omeprazole (PRILOSEC) 20 MG capsule Take 1 capsule (20 mg total) by mouth daily. 30 capsule 3  . OVER THE COUNTER MEDICATION daily. Mega Q Plus Max - Dr. Talmage Coin 's - Daily    . Probiotic Product (PROBIOTIC DAILY PO) Take by mouth daily.    . ramipril (ALTACE) 10 MG capsule Take 1 capsule (10 mg total) by mouth daily. 90 capsule 3  . rosuvastatin (CRESTOR) 5 MG tablet Take 1 tablet (5 mg total) by mouth daily. 30 tablet 5  . Simethicone (GAS-X PO) Take by mouth. Per Patient she takes a couple (2) a day.     Facility-Administered Medications Prior to Visit  Medication Dose Route Frequency Provider Last Rate Last Admin  . hyoscyamine (LEVSIN SL) SL tablet 0.125 mg  0.125 mg Sublingual Q6H PRN Laurine Blazer B, PA-C        Allergies  Allergen Reactions  . Codeine   . Fosamax [Alendronate Sodium]     heartburn    Review of Systems  Constitutional: Negative for fatigue and fever.  Respiratory: Negative.    Cardiovascular: Negative.   Gastrointestinal: Positive for diarrhea, nausea and vomiting. Negative for abdominal distention, abdominal pain, anal bleeding, blood in stool, constipation and rectal pain.       Objective:    Physical Exam Constitutional:      Appearance: Normal appearance.  Cardiovascular:     Rate and Rhythm: Normal rate and regular rhythm.     Pulses: Normal pulses.     Heart sounds: Normal heart sounds.  Pulmonary:  Effort: Pulmonary effort is normal.     Breath sounds: Normal breath sounds.  Abdominal:     General: Abdomen is flat. There is no distension.     Palpations: Abdomen is soft. There is no mass.     Tenderness: There is abdominal tenderness. There is no guarding or rebound.     Hernia: No hernia is present.     Comments: mild LLQ tenderness  Neurological:     Mental Status: She is alert.     BP 115/74   Pulse 82   Temp 98.4 F (36.9 C)   Resp 20   Ht 5' 5.5" (1.664 m)   Wt 119 lb (54 kg)   SpO2 97%   BMI 19.50 kg/m  Wt Readings from Last 3 Encounters:  12/18/20 119 lb (54 kg)  10/24/20 129 lb (58.5 kg)  10/19/20 131 lb (59.4 kg)    Health Maintenance Due  Topic Date Due  . COVID-19 Vaccine (2 - Booster for Janssen series) 07/19/2020    There are no preventive care reminders to display for this patient.   Lab Results  Component Value Date   TSH 2.810 08/04/2020   Lab Results  Component Value Date   WBC 5.2 08/04/2020   HGB 14.0 08/04/2020   HCT 41.4 08/04/2020   MCV 90 08/04/2020   PLT 297 08/04/2020   Lab Results  Component Value Date   NA 141 08/04/2020   K 4.4 08/04/2020   CO2 25 08/04/2020   GLUCOSE 96 08/04/2020   BUN 15 08/04/2020   CREATININE 0.96 08/04/2020   BILITOT 0.4 08/04/2020   ALKPHOS 127 (H) 08/04/2020   AST 26 08/04/2020   ALT 18 08/04/2020   PROT 7.0 08/04/2020   ALBUMIN 4.3 08/04/2020   CALCIUM 9.8 08/04/2020   ANIONGAP 8 12/04/2018   Lab Results  Component Value Date   CHOL 311 (H)  08/04/2020   Lab Results  Component Value Date   HDL 99 08/04/2020   Lab Results  Component Value Date   LDLCALC 185 (H) 08/04/2020   Lab Results  Component Value Date   TRIG 151 (H) 08/04/2020   Lab Results  Component Value Date   CHOLHDL 3.1 08/04/2020   Lab Results  Component Value Date   HGBA1C 5.6 09/03/2016       Assessment & Plan:   Problem List Items Addressed This Visit      Other   Diarrhea - Primary    -has had watery stools 3-4x per day for 5 days -had N/V originally, but vomiting has stopped -Rx. zofran PRN for N/V -Rx. Imodium for diarrhea -considered diverticulitis with mild LLQ tenderness on palpation, but she does not c/o of abdominal pain, so not likely diverticulitis -if pain gets worse may consider cipro/flagyl          Meds ordered this encounter  Medications  . ondansetron (ZOFRAN-ODT) 8 MG disintegrating tablet    Sig: Take 1 tablet (8 mg total) by mouth every 8 (eight) hours as needed for nausea or vomiting.    Dispense:  20 tablet    Refill:  0  . loperamide (IMODIUM) 2 MG capsule    Sig: Take 1 capsule (2 mg total) by mouth as needed for diarrhea or loose stools. Take 2 capsules now, and 1 capsule with each loose stool (max 5 in a 24 hour period)    Dispense:  30 capsule    Refill:  0     [place],  NP

## 2020-12-20 ENCOUNTER — Encounter: Payer: Self-pay | Admitting: Family Medicine

## 2020-12-20 ENCOUNTER — Other Ambulatory Visit: Payer: Self-pay

## 2020-12-20 ENCOUNTER — Ambulatory Visit (INDEPENDENT_AMBULATORY_CARE_PROVIDER_SITE_OTHER): Payer: PPO | Admitting: Family Medicine

## 2020-12-20 DIAGNOSIS — R300 Dysuria: Secondary | ICD-10-CM

## 2020-12-20 DIAGNOSIS — R197 Diarrhea, unspecified: Secondary | ICD-10-CM | POA: Diagnosis not present

## 2020-12-20 MED ORDER — ESTROGENS, CONJUGATED 0.625 MG/GM VA CREA: TOPICAL_CREAM | VAGINAL | 5 refills | Status: DC

## 2020-12-20 NOTE — Progress Notes (Signed)
Subjective:  Patient ID: Kelsey Nelson, female    DOB: [birth year]  Age: 86 y.o. MRN: [id number]  CC:  Chief Complaint  Patient presents with  . Constipation    Was here Monday with diarrhea and now she is unable to have a bm. Haven't eaten much and no abdominal pain but states it isn't like her to go this long       HPI  HPI     Today patient denies signs and symptoms of COVID 19 infection including fever, chills, cough, shortness of breath, and headache. Past Medical, Surgical, Social History, Allergies, and Medications have been Reviewed.   Past Medical History:  Diagnosis Date  . Acute cystitis with hematuria 10/26/2013  . Allergic rhinitis 11/08/2017  . Blindness of right eye   . BMI between 19-24,adult 2010 145 LBS  . Diverticula of small intestine Dec 2010  . Diverticulosis    [place]  . DIVERTICULOSIS, SIGMOID COLON 03/31/2008   Qualifier: Diagnosis of  By: Dierdre Harness  Noted on abdominal and pelvic scan in 04/2011.  Extensive colonic diverticulosis , and possible muscular hypertrophy at rectosigmoid junction   . Family hx of colon cancer 11/09/2013  . Functional constipation 2010  . GERD (gastroesophageal reflux disease)   . HTN (hypertension)   . Hyperlipemia   . Pancreatic cyst 02/05/2013  . Renal cyst 05/27/2011  . Right eye trauma 1949   Loss of vision resulted at age 68  . Schatzki's ring Dec 2010  . Skin lesions, generalized 06/19/2015  . Tubular adenoma 11/09/2013    Current Meds  Medication Sig  . betamethasone dipropionate 0.05 % cream Apply twice daily to rash on right leg for 10 days, then as needed  . Calcium Carbonate Antacid (TUMS PO) Take by mouth. Per patient she takes as needed for acid reflux , heart burn. Maybe 2 a day.  . Cholecalciferol (VITAMIN D3 PO) Take 25 mcg by mouth.  [name] imipramine (TOFRANIL) 10 MG tablet Take 1 tablet (10 mg total) by mouth at bedtime.  [name] loperamide (IMODIUM) 2 MG capsule Take 1 capsule (2 mg total) by mouth as  needed for diarrhea or loose stools. Take 2 capsules now, and 1 capsule with each loose stool (max 5 in a 24 hour period)  . LUMIGAN 0.01 % SOLN Place 1 drop into the left eye at bedtime.  . Multiple Vitamin (MULTI-VITAMIN DAILY PO) Take by mouth daily. doTerra Women -  Bone Nutireint  . Omega-3 Fatty Acids (OMEGA-3 PLUS PO) Take by mouth.  [name] omeprazole (PRILOSEC) 20 MG capsule Take 1 capsule (20 mg total) by mouth daily.  . ondansetron (ZOFRAN-ODT) 8 MG disintegrating tablet Take 1 tablet (8 mg total) by mouth every 8 (eight) hours as needed for nausea or vomiting.  [name] OVER THE COUNTER MEDICATION daily. Mega Q Plus Max - Dr. Talmage Coin 's - Daily  . Probiotic Product (PROBIOTIC DAILY PO) Take by mouth daily.  . ramipril (ALTACE) 10 MG capsule Take 1 capsule (10 mg total) by mouth daily.  . rosuvastatin (CRESTOR) 5 MG tablet Take 1 tablet (5 mg total) by mouth daily.  . Simethicone (GAS-X PO) Take by mouth. Per Patient she takes a couple (2) a day.  . [DISCONTINUED] conjugated estrogens (PREMARIN) vaginal cream Apply one fingertip to introitus every day   Current Facility-Administered Medications for the 12/20/20 encounter (Office Visit) with Perlie Mayo, NP  Medication  . hyoscyamine (LEVSIN SL) SL tablet 0.125 mg  ROS:  Review of Systems  Constitutional: Negative.   HENT: Negative.   Eyes: Negative.   Respiratory: Negative.   Cardiovascular: Negative.   Gastrointestinal: Negative.        Stool amount change  Genitourinary: Negative.   Musculoskeletal: Negative.   Skin: Negative.   Neurological: Negative.   Endo/Heme/Allergies: Negative.   Psychiatric/Behavioral: Negative.      Objective:   Today's Vitals: BP 137/79   Pulse 80   Resp 15   Ht 5' 5.5" (1.664 m)   Wt 129 lb (58.5 kg)   SpO2 95%   BMI 21.14 kg/m  Vitals with BMI 12/20/2020 12/18/2020 10/24/2020  Height 5' 5.5" 5' 5.5" 5' 5.5"  Weight 129 lbs 119 lbs 129 lbs  BMI 21.13 19.49 21.13  Systolic 137 115 732   Diastolic 79 74 81  Pulse 80 82 79     Physical Exam Vitals and nursing note reviewed.  Constitutional:      Appearance: Normal appearance. She is well-developed, well-groomed and normal weight.  HENT:     Head: Normocephalic and atraumatic.     Right Ear: External ear normal.     Left Ear: External ear normal.     Mouth/Throat:     Comments: Mask in place  Eyes:     General:        Right eye: No discharge.        Left eye: No discharge.     Conjunctiva/sclera: Conjunctivae normal.  Cardiovascular:     Rate and Rhythm: Normal rate and regular rhythm.     Pulses: Normal pulses.     Heart sounds: Normal heart sounds.  Pulmonary:     Effort: Pulmonary effort is normal.     Breath sounds: Normal breath sounds.  Musculoskeletal:        General: Normal range of motion.     Cervical back: Normal range of motion and neck supple.  Skin:    General: Skin is warm.  Neurological:     General: No focal deficit present.     Mental Status: She is alert and oriented to person, place, and time.  Psychiatric:        Attention and Perception: Attention normal.        Mood and Affect: Mood normal.        Speech: Speech normal.        Behavior: Behavior normal. Behavior is cooperative.        Thought Content: Thought content normal.        Cognition and Memory: Cognition normal.        Judgment: Judgment normal.     Assessment   1. Diarrhea, unspecified type   2. Burning with urination     Tests ordered No orders of the defined types were placed in this encounter.    Plan: Please see assessment and plan per problem list above.   Meds ordered this encounter  Medications  . conjugated estrogens (PREMARIN) vaginal cream    Sig: Apply one fingertip to introitus every 2 days.    Dispense:  42.5 g    Refill:  5    administration change    Order Specific Question:   Supervising Provider    Answer:   Kerri Perches [[id number]]    Patient to follow-up in as scheduled     Freddy Finner, NP

## 2020-12-20 NOTE — Assessment & Plan Note (Signed)
Resolved, but was concerned as she did not have a BM the last 2 days. She reports not taking the imodium as she ended up not needing it. She reports limited intake due to upset stomach. Advised to continue to eat well balanced diet and hydrate well. If no BM by Monday then to call us back. Patient acknowledged agreement and understanding of the plan.

## 2020-12-20 NOTE — Patient Instructions (Signed)
I appreciate the opportunity to provide you with care for your health and wellness. Today we discussed: recent bowel changes  Follow up: as scheduled   No labs or referrals today  I will send in a different medication to help in place of premarin. Let Dr Lodema Hong know at next appt if it is not working.  Please continue to practice social distancing to keep you, your family, and our community safe.  If you must go out, please wear a mask and practice good handwashing.  It was a pleasure to see you and I look forward to continuing to work together on your health and well-being. Please do not hesitate to call the office if you need care or have questions about your care.  Have a wonderful day. With Gratitude, Tereasa Coop, DNP, AGNP-BC

## 2020-12-20 NOTE — Assessment & Plan Note (Signed)
Has been using premarin cream and improved S&S  But it is costly- she reports using daily, would like to have her reduce this to 3 times a week. Estradiol is an option if cost is still and issue - she has been getting samples from urologist, and I asked her to continue to do so if they would.   She is to f/u at appt with Dr Lodema Hong Patient acknowledged agreement and understanding of the plan.

## 2020-12-27 DIAGNOSIS — H2512 Age-related nuclear cataract, left eye: Secondary | ICD-10-CM | POA: Diagnosis not present

## 2020-12-27 DIAGNOSIS — H16222 Keratoconjunctivitis sicca, not specified as Sjogren's, left eye: Secondary | ICD-10-CM | POA: Diagnosis not present

## 2020-12-27 DIAGNOSIS — H40052 Ocular hypertension, left eye: Secondary | ICD-10-CM | POA: Diagnosis not present

## 2021-01-10 ENCOUNTER — Other Ambulatory Visit: Payer: Self-pay

## 2021-01-10 ENCOUNTER — Encounter: Payer: Self-pay | Admitting: Internal Medicine

## 2021-01-10 ENCOUNTER — Ambulatory Visit (INDEPENDENT_AMBULATORY_CARE_PROVIDER_SITE_OTHER): Payer: PPO | Admitting: Internal Medicine

## 2021-01-10 ENCOUNTER — Telehealth: Payer: PPO | Admitting: Family Medicine

## 2021-01-10 VITALS — BP 125/79 | HR 84 | Temp 98.4°F | Resp 20 | Ht 65.5 in | Wt 124.0 lb

## 2021-01-10 DIAGNOSIS — K5900 Constipation, unspecified: Secondary | ICD-10-CM | POA: Diagnosis not present

## 2021-01-10 DIAGNOSIS — K219 Gastro-esophageal reflux disease without esophagitis: Secondary | ICD-10-CM | POA: Diagnosis not present

## 2021-01-10 DIAGNOSIS — R110 Nausea: Secondary | ICD-10-CM | POA: Diagnosis not present

## 2021-01-10 MED ORDER — ONDANSETRON 8 MG PO TBDP: 8.0000 mg | ORAL_TABLET | Freq: Three times a day (TID) | ORAL | 0 refills | Status: DC | PRN

## 2021-01-10 MED ORDER — POLYETHYLENE GLYCOL 3350 17 GM/SCOOP PO POWD: 17.0000 g | Freq: Every day | ORAL | 1 refills | Status: DC

## 2021-01-10 NOTE — Patient Instructions (Signed)
Please take Miralax powder only if you don't have bowel movement for more than 2 days. Please do not take it if you have loose bowel movement.  Please stop taking Omeprazole for now and come back for H Pylori testing on 02/01.  Please take Ondansetron for nausea as prescribed.  Please increase water intake to at least 2 liters in a day.

## 2021-01-10 NOTE — Assessment & Plan Note (Signed)
Alternating with diarrhea, could be related to IBS Recently was prescribed Loperamide for diarrhea Advised to avoid Loperamide for now Has BM every 5 days, Miralax prescribed

## 2021-01-10 NOTE — Assessment & Plan Note (Signed)
On Omeprazole Advised to stop taking Omeprazole for now for H Pylori testing.

## 2021-01-10 NOTE — Assessment & Plan Note (Signed)
Persistent nausea, better with Zofran Will check H Pylori breath test after 2 weeks since she is on Omeprazole Zofran refilled for now Referred to GI for further evaluation - EGD if needed

## 2021-01-10 NOTE — Progress Notes (Signed)
Established Patient Office Visit  Subjective:  Patient ID: Kelsey Nelson, female    DOB: 07-Jan-1934  Age: 87 y.o. MRN: [id number]  CC:  Chief Complaint  Patient presents with  . Follow-up  . Constipation    BM every 5 days.     HPI Kelsey Nelson is a 87 year old female with PMH of HTN, HLD and osteoporosis who presents for c/o constipation.  She was recently evaluated for diarrhea and was given Loperamide. Diarrhea has resolved, but she has BM every 5 days now, and has small BM. She still has nausea, which is better with Zofran. She denies any heartburn or abdominal pain. Denies fever, chills, melena or hematochezia.  Past Medical History:  Diagnosis Date  . Acute cystitis with hematuria 10/26/2013  . Allergic rhinitis 11/08/2017  . Blindness of right eye   . BMI between 19-24,adult 2010 145 LBS  . Diverticula of small intestine Dec 2010  . Diverticulosis    [place]  . DIVERTICULOSIS, SIGMOID COLON 03/31/2008   Qualifier: Diagnosis of  By: Dierdre Harness  Noted on abdominal and pelvic scan in 04/2011.  Extensive colonic diverticulosis , and possible muscular hypertrophy at rectosigmoid junction   . Family hx of colon cancer 11/09/2013  . Functional constipation 2010  . GERD (gastroesophageal reflux disease)   . HTN (hypertension)   . Hyperlipemia   . Pancreatic cyst 02/05/2013  . Renal cyst 05/27/2011  . Right eye trauma 1949   Loss of vision resulted at age 70  . Schatzki's ring Dec 2010  . Skin lesions, generalized 06/19/2015  . Tubular adenoma 11/09/2013    Past Surgical History:  Procedure Laterality Date  . ABDOMINAL HYSTERECTOMY    . BREAST REDUCTION SURGERY  1992 BIL  . COLONOSCOPY  DEC 2010   SIMPLE ADENOMAS (6-8), [place] TICS, SML IH  . COLONOSCOPY N/A 12/08/2013   Procedure: COLONOSCOPY;  Surgeon: Rogene Houston, MD;  Location: [facility];  Service: Endoscopy;  Laterality: N/A;  225  . EVISCERATION Right 05/08/2015   Procedure: EVISCERATION WITH IMPLANT RIGHT EYE;   Surgeon: Williams Che, MD;  Location: [facility];  Service: Ophthalmology;  Laterality: Right;  . EYE SURGERY  RIGHT 2005  . EYE SURGERY  right 2016   removed and false eye placed in 08/2015  . TOTAL ABDOMINAL HYSTERECTOMY W/ BILATERAL SALPINGOOPHORECTOMY  1985 FIBROIDS  . UPPER GASTROINTESTINAL ENDOSCOPY  DEC 2010   Bx-MILD GASTRITIS/DUODENITIS, DUODENAL DIVERTICULA, SCHATZKI'S RING    Family History  Problem Relation Age of Onset  . Pancreatic cancer Sister   . Colon cancer Paternal Aunt   . Colon polyps Neg Hx     Social History   Socioeconomic History  . Marital status: Married    Spouse name: [name]   . Number of children: 3  . Years of education: 88  . Highest education level: 12th grade  Occupational History  . Occupation: retired   Tobacco Use  . Smoking status: Never Smoker  . Smokeless tobacco: Never Used  Vaping Use  . Vaping Use: Never used  Substance and Sexual Activity  . Alcohol use: No    Alcohol/week: 0.0 standard drinks  . Drug use: No  . Sexual activity: Not Currently  Other Topics Concern  . Not on file  Social History Narrative   MARRIED TO DAVID Puskarich. 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 Difficulty of Paying Living Expenses: Not very  hard  Food Insecurity: No Food Insecurity  . Worried About Charity fundraiser in the Last Year: Never true  . Ran Out of Food in the Last Year: Never true  Transportation Needs: No Transportation Needs  . Lack of Transportation (Medical): No  . Lack of Transportation (Non-Medical): No  Physical Activity: Sufficiently Active  . Days of Exercise per Week: 5 days  . Minutes of Exercise per Session: 60 min  Stress: Stress Concern Present  . Feeling of Stress : Rather much  Social Connections: Moderately Integrated  . Frequency of Communication with Friends and Family: Twice a week  . Frequency of Social Gatherings with Friends and Family: Three  times a week  . Attends Religious Services: More than 4 times per year  . Active Member of Clubs or Organizations: No  . Attends Archivist Meetings: Never  . Marital Status: Married  Human resources officer Violence: Not At Risk  . Fear of Current or Ex-Partner: No  . Emotionally Abused: No  . Physically Abused: No  . Sexually Abused: No    Outpatient Medications Prior to Visit  Medication Sig Dispense Refill  . betamethasone dipropionate 0.05 % cream Apply twice daily to rash on right leg for 10 days, then as needed 45 g 0  . Calcium Carbonate Antacid (TUMS PO) Take by mouth. Per patient she takes as needed for acid reflux , heart burn. Maybe 2 a day.    . Cholecalciferol (VITAMIN D3 PO) Take 25 mcg by mouth.    . conjugated estrogens (PREMARIN) vaginal cream Apply one fingertip to introitus every 2 days. 42.5 g 5  . imipramine (TOFRANIL) 10 MG tablet Take 1 tablet (10 mg total) by mouth at bedtime. 30 tablet 3  . loperamide (IMODIUM) 2 MG capsule Take 1 capsule (2 mg total) by mouth as needed for diarrhea or loose stools. Take 2 capsules now, and 1 capsule with each loose stool (max 5 in a 24 hour period) 30 capsule 0  . LUMIGAN 0.01 % SOLN Place 1 drop into the left eye at bedtime.    . Multiple Vitamin (MULTI-VITAMIN DAILY PO) Take by mouth daily. doTerra Women -  Bone Nutireint    . Omega-3 Fatty Acids (OMEGA-3 PLUS PO) Take by mouth.    [name] omeprazole (PRILOSEC) 20 MG capsule Take 1 capsule (20 mg total) by mouth daily. 30 capsule 3  . OVER THE COUNTER MEDICATION daily. Mega Q Plus Max - Dr. Talmage Coin 's - Daily    . Probiotic Product (PROBIOTIC DAILY PO) Take by mouth daily.    . ramipril (ALTACE) 10 MG capsule Take 1 capsule (10 mg total) by mouth daily. 90 capsule 3  . rosuvastatin (CRESTOR) 5 MG tablet Take 1 tablet (5 mg total) by mouth daily. 30 tablet 5  . Simethicone (GAS-X PO) Take by mouth. Per Patient she takes a couple (2) a day.    . ondansetron (ZOFRAN-ODT) 8 MG  disintegrating tablet Take 1 tablet (8 mg total) by mouth every 8 (eight) hours as needed for nausea or vomiting. 20 tablet 0   Facility-Administered Medications Prior to Visit  Medication Dose Route Frequency Provider Last Rate Last Admin  . hyoscyamine (LEVSIN SL) SL tablet 0.125 mg  0.125 mg Sublingual Q6H PRN Laurine Blazer B, PA-C        Allergies  Allergen Reactions  . Codeine   . Fosamax [Alendronate Sodium]     heartburn    ROS Review of Systems  Constitutional:  Negative for chills and fever.  HENT: Negative for congestion, sinus pressure, sinus pain and sore throat.   Eyes: Negative for pain and discharge.  Respiratory: Negative for cough and shortness of breath.   Cardiovascular: Negative for chest pain and palpitations.  Gastrointestinal: Positive for constipation and nausea. Negative for abdominal pain, diarrhea and vomiting.  Endocrine: Negative for polydipsia and polyuria.  Genitourinary: Negative for dysuria and hematuria.  Musculoskeletal: Negative for neck pain and neck stiffness.  Skin: Negative for rash.  Neurological: Negative for dizziness and weakness.  Psychiatric/Behavioral: Negative for agitation and behavioral problems.      Objective:    Physical Exam Vitals reviewed.  Constitutional:      General: She is not in acute distress.    Appearance: She is not diaphoretic.  HENT:     Head: Normocephalic and atraumatic.     Nose: Nose normal. No congestion.     Mouth/Throat:     Mouth: Mucous membranes are moist.     Pharynx: No posterior oropharyngeal erythema.  Eyes:     General: No scleral icterus.    Extraocular Movements: Extraocular movements intact.     Pupils: Pupils are equal, round, and reactive to light.  Cardiovascular:     Rate and Rhythm: Normal rate and regular rhythm.     Pulses: Normal pulses.     Heart sounds: Normal heart sounds. No murmur heard.   Pulmonary:     Breath sounds: Normal breath sounds. No wheezing or rales.   Abdominal:     Palpations: Abdomen is soft.     Tenderness: There is no abdominal tenderness. There is no guarding or rebound.  Musculoskeletal:     Cervical back: Neck supple. No tenderness.     Right lower leg: No edema.     Left lower leg: No edema.  Skin:    General: Skin is warm.     Findings: No rash.  Neurological:     General: No focal deficit present.     Mental Status: She is alert and oriented to person, place, and time.  Psychiatric:        Mood and Affect: Mood normal.        Behavior: Behavior normal.     BP 125/79   Pulse 84   Temp 98.4 F (36.9 C)   Resp 20   Ht 5' 5.5" (1.664 m)   Wt 124 lb (56.2 kg)   SpO2 95%   BMI 20.32 kg/m  Wt Readings from Last 3 Encounters:  01/10/21 124 lb (56.2 kg)  12/20/20 129 lb (58.5 kg)  12/18/20 119 lb (54 kg)     Health Maintenance Due  Topic Date Due  . COVID-19 Vaccine (2 - Booster for Janssen series) 07/19/2020    There are no preventive care reminders to display for this patient.  Lab Results  Component Value Date   TSH 2.810 08/04/2020   Lab Results  Component Value Date   WBC 5.2 08/04/2020   HGB 14.0 08/04/2020   HCT 41.4 08/04/2020   MCV 90 08/04/2020   PLT 297 08/04/2020   Lab Results  Component Value Date   NA 141 08/04/2020   K 4.4 08/04/2020   CO2 25 08/04/2020   GLUCOSE 96 08/04/2020   BUN 15 08/04/2020   CREATININE 0.96 08/04/2020   BILITOT 0.4 08/04/2020   ALKPHOS 127 (H) 08/04/2020   AST 26 08/04/2020   ALT 18 08/04/2020   PROT 7.0 08/04/2020   ALBUMIN 4.3 08/04/2020  CALCIUM 9.8 08/04/2020   ANIONGAP 8 12/04/2018   Lab Results  Component Value Date   CHOL 311 (H) 08/04/2020   Lab Results  Component Value Date   HDL 99 08/04/2020   Lab Results  Component Value Date   LDLCALC 185 (H) 08/04/2020   Lab Results  Component Value Date   TRIG 151 (H) 08/04/2020   Lab Results  Component Value Date   CHOLHDL 3.1 08/04/2020   Lab Results  Component Value Date    HGBA1C 5.6 09/03/2016      Assessment & Plan:   Problem List Items Addressed This Visit      Digestive   GERD    On Omeprazole Advised to stop taking Omeprazole for now for H Pylori testing.      Relevant Medications   ondansetron (ZOFRAN-ODT) 8 MG disintegrating tablet   polyethylene glycol powder (GLYCOLAX/MIRALAX) 17 GM/SCOOP powder     Other   Constipation    Alternating with diarrhea, could be related to IBS Recently was prescribed Loperamide for diarrhea Advised to avoid Loperamide for now Has BM every 5 days, Miralax prescribed      Relevant Medications   polyethylene glycol powder (GLYCOLAX/MIRALAX) 17 GM/SCOOP powder   Other Relevant Orders   Ambulatory referral to Gastroenterology   Nausea - Primary    Persistent nausea, better with Zofran Will check H Pylori breath test after 2 weeks since she is on Omeprazole Zofran refilled for now Referred to GI for further evaluation - EGD if needed      Relevant Medications   ondansetron (ZOFRAN-ODT) 8 MG disintegrating tablet   Other Relevant Orders   H. pylori breath test   Ambulatory referral to Gastroenterology      Meds ordered this encounter  Medications  . ondansetron (ZOFRAN-ODT) 8 MG disintegrating tablet    Sig: Take 1 tablet (8 mg total) by mouth every 8 (eight) hours as needed for nausea or vomiting.    Dispense:  20 tablet    Refill:  0  . polyethylene glycol powder (GLYCOLAX/MIRALAX) 17 GM/SCOOP powder    Sig: Take 17 g by mouth daily.    Dispense:  3350 g    Refill:  1    Follow-up: Return in about 6 weeks (around 02/21/2021).    Lindell Spar, MD

## 2021-01-11 ENCOUNTER — Encounter (INDEPENDENT_AMBULATORY_CARE_PROVIDER_SITE_OTHER): Payer: Self-pay | Admitting: *Deleted

## 2021-01-16 ENCOUNTER — Encounter: Payer: PPO | Admitting: Family Medicine

## 2021-01-18 ENCOUNTER — Ambulatory Visit (INDEPENDENT_AMBULATORY_CARE_PROVIDER_SITE_OTHER): Payer: PPO

## 2021-01-18 ENCOUNTER — Other Ambulatory Visit: Payer: Self-pay

## 2021-01-18 DIAGNOSIS — Z0000 Encounter for general adult medical examination without abnormal findings: Secondary | ICD-10-CM

## 2021-01-18 NOTE — Patient Instructions (Signed)
[name] , Thank you for taking time to come for your Medicare Wellness Visit. I appreciate your ongoing commitment to your health goals. Please review the following plan we discussed and let me know if I can assist you in the future.   Screening recommendations/referrals: Colonoscopy: No longer required.  Mammogram: No longer required.  Bone Density: Complete. No longer required.   Recommended yearly ophthalmology/optometry visit for glaucoma screening and checkup Recommended yearly dental visit for hygiene and checkup  Vaccinations: Influenza vaccine: Complete  Pneumococcal vaccine: Complete  Tdap vaccine: Complete  Shingles vaccine: Completed Zostavax; Eligible for Shingrix     Advanced directives: Yes   Conditions/risks identified: None   Next appointment: 02/22/21 @ 9:20am with Dr. Posey Pronto, MD    Preventive Care 65 Years and Older, Female Preventive care refers to lifestyle choices and visits with your health care provider that can promote health and wellness. What does preventive care include?  A yearly physical exam. This is also called an annual well check.  Dental exams once or twice a year.  Routine eye exams. Ask your health care provider how often you should have your eyes checked.  Personal lifestyle choices, including:  Daily care of your teeth and gums.  Regular physical activity.  Eating a healthy diet.  Avoiding tobacco and drug use.  Limiting alcohol use.  Practicing safe sex.  Taking low-dose aspirin every day.  Taking vitamin and mineral supplements as recommended by your health care provider. What happens during an annual well check? The services and screenings done by your health care provider during your annual well check will depend on your age, overall health, lifestyle risk factors, and family history of disease. Counseling  Your health care provider may ask you questions about your:  Alcohol use.  Tobacco use.  Drug use.  Emotional  well-being.  Home and relationship well-being.  Sexual activity.  Eating habits.  History of falls.  Memory and ability to understand (cognition).  Work and work Statistician.  Reproductive health. Screening  You may have the following tests or measurements:  Height, weight, and BMI.  Blood pressure.  Lipid and cholesterol levels. These may be checked every 5 years, or more frequently if you are over 6 years old.  Skin check.  Lung cancer screening. You may have this screening every year starting at age 29 if you have a 30-pack-year history of smoking and currently smoke or have quit within the past 15 years.  Fecal occult blood test (FOBT) of the stool. You may have this test every year starting at age 26.  Flexible sigmoidoscopy or colonoscopy. You may have a sigmoidoscopy every 5 years or a colonoscopy every 10 years starting at age 80.  Hepatitis C blood test.  Hepatitis B blood test.  Sexually transmitted disease (STD) testing.  Diabetes screening. This is done by checking your blood sugar (glucose) after you have not eaten for a while (fasting). You may have this done every 1-3 years.  Bone density scan. This is done to screen for osteoporosis. You may have this done starting at age 29.  Mammogram. This may be done every 1-2 years. Talk to your health care provider about how often you should have regular mammograms. Talk with your health care provider about your test results, treatment options, and if necessary, the need for more tests. Vaccines  Your health care provider may recommend certain vaccines, such as:  Influenza vaccine. This is recommended every year.  Tetanus, diphtheria, and acellular pertussis (Tdap,  Td) vaccine. You may need a Td booster every 10 years.  Zoster vaccine. You may need this after age 20.  Pneumococcal 13-valent conjugate (PCV13) vaccine. One dose is recommended after age 70.  Pneumococcal polysaccharide (PPSV23) vaccine. One  dose is recommended after age 52. Talk to your health care provider about which screenings and vaccines you need and how often you need them. This information is not intended to replace advice given to you by your health care provider. Make sure you discuss any questions you have with your health care provider. Document Released: 01/05/2016 Document Revised: 08/28/2016 Document Reviewed: 10/10/2015 Elsevier Interactive Patient Education  2017 [employer] Prevention in the Home Falls can cause injuries. They can happen to people of all ages. There are many things you can do to make your home safe and to help prevent falls. What can I do on the outside of my home?  Regularly fix the edges of walkways and driveways and fix any cracks.  Remove anything that might make you trip as you walk through a door, such as a raised step or threshold.  Trim any bushes or trees on the path to your home.  Use bright outdoor lighting.  Clear any walking paths of anything that might make someone trip, such as rocks or tools.  Regularly check to see if handrails are loose or broken. Make sure that both sides of any steps have handrails.  Any raised decks and porches should have guardrails on the edges.  Have any leaves, snow, or ice cleared regularly.  Use sand or salt on walking paths during winter.  Clean up any spills in your garage right away. This includes oil or grease spills. What can I do in the bathroom?  Use night lights.  Install grab bars by the toilet and in the tub and shower. Do not use towel bars as grab bars.  Use non-skid mats or decals in the tub or shower.  If you need to sit down in the shower, use a plastic, non-slip stool.  Keep the floor dry. Clean up any water that spills on the floor as soon as it happens.  Remove soap buildup in the tub or shower regularly.  Attach bath mats securely with double-sided non-slip rug tape.  Do not have throw rugs and other  things on the floor that can make you trip. What can I do in the bedroom?  Use night lights.  Make sure that you have a light by your bed that is easy to reach.  Do not use any sheets or blankets that are too big for your bed. They should not hang down onto the floor.  Have a firm chair that has side arms. You can use this for support while you get dressed.  Do not have throw rugs and other things on the floor that can make you trip. What can I do in the kitchen?  Clean up any spills right away.  Avoid walking on wet floors.  Keep items that you use a lot in easy-to-reach places.  If you need to reach something above you, use a strong step stool that has a grab bar.  Keep electrical cords out of the way.  Do not use floor polish or wax that makes floors slippery. If you must use wax, use non-skid floor wax.  Do not have throw rugs and other things on the floor that can make you trip. What can I do with my stairs?  Do not  leave any items on the stairs.  Make sure that there are handrails on both sides of the stairs and use them. Fix handrails that are broken or loose. Make sure that handrails are as long as the stairways.  Check any carpeting to make sure that it is firmly attached to the stairs. Fix any carpet that is loose or worn.  Avoid having throw rugs at the top or bottom of the stairs. If you do have throw rugs, attach them to the floor with carpet tape.  Make sure that you have a light switch at the top of the stairs and the bottom of the stairs. If you do not have them, ask someone to add them for you. What else can I do to help prevent falls?  Wear shoes that:  Do not have high heels.  Have rubber bottoms.  Are comfortable and fit you well.  Are closed at the toe. Do not wear sandals.  If you use a stepladder:  Make sure that it is fully opened. Do not climb a closed stepladder.  Make sure that both sides of the stepladder are locked into place.  Ask  someone to hold it for you, if possible.  Clearly mark and make sure that you can see:  Any grab bars or handrails.  First and last steps.  Where the edge of each step is.  Use tools that help you move around (mobility aids) if they are needed. These include:  Canes.  Walkers.  Scooters.  Crutches.  Turn on the lights when you go into a dark area. Replace any light bulbs as soon as they burn out.  Set up your furniture so you have a clear path. Avoid moving your furniture around.  If any of your floors are uneven, fix them.  If there are any pets around you, be aware of where they are.  Review your medicines with your doctor. Some medicines can make you feel dizzy. This can increase your chance of falling. Ask your doctor what other things that you can do to help prevent falls. This information is not intended to replace advice given to you by your health care provider. Make sure you discuss any questions you have with your health care provider. Document Released: 10/05/2009 Document Revised: 05/16/2016 Document Reviewed: 01/13/2015 Elsevier Interactive Patient Education  2017 [employer].

## 2021-01-18 NOTE — Progress Notes (Signed)
Subjective:   Kelsey Nelson is a 87 y.o. female who presents for Medicare Annual (Subsequent) preventive examination.        Objective:    There were no vitals filed for this visit. There is no height or weight on file to calculate BMI.  Advanced Directives 05/05/2019 01/06/2019 12/04/2018 01/05/2018 07/22/2015 05/04/2015 12/08/2013  Does Patient Have a Medical Advance Directive? Yes Yes No;Yes Yes Yes No Patient has advance directive, copy not in chart  Type of Advance Directive Healthcare Power of [place];Living will Healthcare Power of [employer] of [place];Living will - - - Public affairs consultant of [place];Living will  Does patient want to make changes to medical advance directive? No - Patient declined No - Patient declined - - - - -  Copy of Healthcare Power of Attorney in Chart? No - copy requested Yes - validated most recent copy scanned in chart (See row information) - - Yes - Copy requested from family  Would patient like information on creating a medical advance directive? - - - - - No - patient declined information -  Pre-existing out of facility DNR order (yellow form or pink MOST form) - - - - - - No    Current Medications (verified) Outpatient Encounter Medications as of 01/18/2021  Medication Sig  . betamethasone dipropionate 0.05 % cream Apply twice daily to rash on right leg for 10 days, then as needed  . Calcium Carbonate Antacid (TUMS PO) Take by mouth. Per patient she takes as needed for acid reflux , heart burn. Maybe 2 a day.  . Cholecalciferol (VITAMIN D3 PO) Take 25 mcg by mouth.  . conjugated estrogens (PREMARIN) vaginal cream Apply one fingertip to introitus every 2 days.  [name] imipramine (TOFRANIL) 10 MG tablet Take 1 tablet (10 mg total) by mouth at bedtime.  [name] loperamide (IMODIUM) 2 MG capsule Take 1 capsule (2 mg total) by mouth as needed for diarrhea or loose stools. Take 2 capsules now, and 1 capsule with each loose stool (max 5 in a 24 hour  period)  . LUMIGAN 0.01 % SOLN Place 1 drop into the left eye at bedtime.  . Multiple Vitamin (MULTI-VITAMIN DAILY PO) Take by mouth daily. doTerra Women -  Bone Nutireint  . Omega-3 Fatty Acids (OMEGA-3 PLUS PO) Take by mouth.  [name] omeprazole (PRILOSEC) 20 MG capsule Take 1 capsule (20 mg total) by mouth daily.  . ondansetron (ZOFRAN-ODT) 8 MG disintegrating tablet Take 1 tablet (8 mg total) by mouth every 8 (eight) hours as needed for nausea or vomiting.  [name] OVER THE COUNTER MEDICATION daily. Mega Q Plus Max - Dr. Mel Almond 's - Daily  . polyethylene glycol powder (GLYCOLAX/MIRALAX) 17 GM/SCOOP powder Take 17 g by mouth daily.  . Probiotic Product (PROBIOTIC DAILY PO) Take by mouth daily.  . ramipril (ALTACE) 10 MG capsule Take 1 capsule (10 mg total) by mouth daily.  . rosuvastatin (CRESTOR) 5 MG tablet Take 1 tablet (5 mg total) by mouth daily.  . Simethicone (GAS-X PO) Take by mouth. Per Patient she takes a couple (2) a day.   Facility-Administered Encounter Medications as of 01/18/2021  Medication  . hyoscyamine (LEVSIN SL) SL tablet 0.125 mg    Allergies (verified) Codeine and Fosamax [alendronate sodium]   History: Past Medical History:  Diagnosis Date  . Acute cystitis with hematuria 10/26/2013  . Allergic rhinitis 11/08/2017  . Blindness of right eye   . BMI between 19-24,adult 2010 145 LBS  . Diverticula  of small intestine Dec 2010  . Diverticulosis    [place]  . DIVERTICULOSIS, SIGMOID COLON 03/31/2008   Qualifier: Diagnosis of  By: Dierdre Harness  Noted on abdominal and pelvic scan in 04/2011.  Extensive colonic diverticulosis , and possible muscular hypertrophy at rectosigmoid junction   . Family hx of colon cancer 11/09/2013  . Functional constipation 2010  . GERD (gastroesophageal reflux disease)   . HTN (hypertension)   . Hyperlipemia   . Pancreatic cyst 02/05/2013  . Renal cyst 05/27/2011  . Right eye trauma 1949   Loss of vision resulted at age 79  . Schatzki's ring Dec  2010  . Skin lesions, generalized 06/19/2015  . Tubular adenoma 11/09/2013   Past Surgical History:  Procedure Laterality Date  . ABDOMINAL HYSTERECTOMY    . BREAST REDUCTION SURGERY  1992 BIL  . COLONOSCOPY  DEC 2010   SIMPLE ADENOMAS (6-8), [place] TICS, SML IH  . COLONOSCOPY N/A 12/08/2013   Procedure: COLONOSCOPY;  Surgeon: Rogene Houston, MD;  Location: [facility];  Service: Endoscopy;  Laterality: N/A;  225  . EVISCERATION Right 05/08/2015   Procedure: EVISCERATION WITH IMPLANT RIGHT EYE;  Surgeon: Williams Che, MD;  Location: [facility];  Service: Ophthalmology;  Laterality: Right;  . EYE SURGERY  RIGHT 2005  . EYE SURGERY  right 2016   removed and false eye placed in 08/2015  . TOTAL ABDOMINAL HYSTERECTOMY W/ BILATERAL SALPINGOOPHORECTOMY  1985 FIBROIDS  . UPPER GASTROINTESTINAL ENDOSCOPY  DEC 2010   Bx-MILD GASTRITIS/DUODENITIS, DUODENAL DIVERTICULA, SCHATZKI'S RING   Family History  Problem Relation Age of Onset  . Pancreatic cancer Sister   . Colon cancer Paternal Aunt   . Colon polyps Neg Hx    Social History   Socioeconomic History  . Marital status: Married    Spouse name: [name]   . Number of children: 3  . Years of education: 26  . Highest education level: 12th grade  Occupational History  . Occupation: retired   Tobacco Use  . Smoking status: Never Smoker  . Smokeless tobacco: Never Used  Vaping Use  . Vaping Use: Never used  Substance and Sexual Activity  . Alcohol use: No    Alcohol/week: 0.0 standard drinks  . Drug use: No  . Sexual activity: Not Currently  Other Topics Concern  . Not on file  Social History Narrative   MARRIED TO DAVID Asencio. 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 Difficulty of Paying Living Expenses: Not very hard  Food Insecurity: No Food Insecurity  . Worried About Charity fundraiser in the Last Year: Never true  . Ran Out of Food in the Last Year: Never true   Transportation Needs: No Transportation Needs  . Lack of Transportation (Medical): No  . Lack of Transportation (Non-Medical): No  Physical Activity: Sufficiently Active  . Days of Exercise per Week: 5 days  . Minutes of Exercise per Session: 60 min  Stress: Stress Concern Present  . Feeling of Stress : Rather much  Social Connections: Moderately Integrated  . Frequency of Communication with Friends and Family: Twice a week  . Frequency of Social Gatherings with Friends and Family: Three times a week  . Attends Religious Services: More than 4 times per year  . Active Member of Clubs or Organizations: No  . Attends Archivist Meetings: Never  . Marital Status: Married    Tobacco Counseling  Counseling given: Not Answered   Clinical Intake:                 Diabetic? No          Activities of Daily Living No flowsheet data found.  Patient Care Team: Fayrene Helper, MD as PCP - General (Family Medicine) Danie Binder, MD (Inactive) (Gastroenterology) Rogene Houston, MD (Gastroenterology) Virgia Land, MD as Referring Physician (Internal Medicine)  Indicate any recent Medical Services you may have received from other than [facility] providers in the past year (date may be approximate).     Assessment:   This is a routine wellness examination for Kelsey Nelson.  Hearing/Vision screen No exam data present  Dietary issues and exercise activities discussed:    Goals    . DIET - INCREASE WATER INTAKE      Depression Screen PHQ 2/9 Scores 01/10/2021 12/18/2020 10/24/2020 10/19/2020 10/19/2020 10/19/2020 09/12/2020  PHQ - 2 Score 2 0 3 2 2 2  0  PHQ- 9 Score 3 4 15 15 15 14  -    Fall Risk Fall Risk  01/10/2021 12/20/2020 12/18/2020 10/24/2020 10/19/2020  Falls in the past year? 0 0 0 0 0  Number falls in past yr: 0 0 0 - 0  Injury with Fall? 0 0 0 - 0  Risk for fall due to : No Fall Risks - No Fall Risks - No Fall Risks  Follow up Falls evaluation  completed - Falls evaluation completed - Falls evaluation completed    FALL RISK PREVENTION PERTAINING TO THE HOME:  Any stairs in or around the home? Yes  If so, are there any without handrails? No  Home free of loose throw rugs in walkways, pet beds, electrical cords, etc? Yes  Adequate lighting in your home to reduce risk of falls? Yes   ASSISTIVE DEVICES UTILIZED TO PREVENT FALLS:  Life alert? No  Use of a cane, walker or w/c? No  Grab bars in the bathroom? No  Shower chair or bench in shower? No  Elevated toilet seat or a handicapped toilet? No   TIMED UP AND GO:  Was the test performed? No .     Cognitive Function:     6CIT Screen 05/30/2020 01/13/2020 01/06/2019 01/05/2018  What Year? 0 points 0 points 0 points 0 points  What month? 0 points 0 points 0 points 0 points  What time? 0 points 0 points 0 points 0 points  Count back from 20 0 points 0 points 0 points 0 points  Months in reverse 0 points 0 points 0 points 0 points  Repeat phrase 0 points 0 points 0 points 0 points  Total Score 0 0 0 0    Immunizations Immunization History  Administered Date(s) Administered  . Fluad Quad(high Dose 65+) 10/11/2019, 09/12/2020  . Influenza Split 09/24/2012, 10/19/2015  . Influenza Whole 12/31/2006, 11/06/2009, 10/29/2011  . Influenza, High Dose Seasonal PF 09/28/2018  . Influenza,inj,Quad PF,6+ Mos 10/26/2013, 12/05/2014, 10/25/2016  . Influenza-Unspecified 10/06/2017  . Janssen (J&J) SARS-COV-2 Vaccination 05/24/2020  . Pneumococcal Conjugate-13 07/11/2014  . Pneumococcal Polysaccharide-23 12/23/2001  . Td 08/06/2004  . Tdap 06/05/2015  . Zoster 04/16/2007    TDAP status: Up to date  Flu Vaccine status: Up to date  Pneumococcal vaccine status: Up to date  Covid-19 vaccine status: Completed vaccines  Qualifies for Shingles Vaccine? Yes   Zostavax completed Yes   Shingrix Completed?: No.    Education has been provided regarding the importance of this vaccine.  Patient has been advised to call insurance company to determine out of pocket expense if they have not yet received this vaccine. Advised may also receive vaccine at local pharmacy or Health Dept. Verbalized acceptance and understanding.  Screening Tests Health Maintenance  Topic Date Due  . COVID-19 Vaccine (2 - Booster for [employer] series) 07/19/2020  . TETANUS/TDAP  06/04/2025  . INFLUENZA VACCINE  Completed  . DEXA SCAN  Completed  . PNA vac Low Risk Adult  Completed    Health Maintenance  Health Maintenance Due  Topic Date Due  . COVID-19 Vaccine (2 - Booster for Janssen series) 07/19/2020    Colorectal cancer screening: Type of screening: Colonoscopy. Completed no longer required. . Repeat every 0 years  Mammogram status: No longer required due to age.  Bone Density Screening: Complete 3  Lung Cancer Screening: (Low Dose CT Chest recommended if Age 37-80 years, 30 pack-year currently smoking OR have quit w/in 15years.) does not qualify.    Additional Screening:  Hepatitis C Screening: does qualify; Completed.   Vision Screening: Recommended annual ophthalmology exams for early detection of glaucoma and other disorders of the eye. Is the patient up to date with their annual eye exam?  Yes  Who is the provider or what is the name of the office in which the patient attends annual eye exams? Dr. Katy Fitch   If pt is not established with a provider, would they like to be referred to a provider to establish care? No .   Dental Screening: Recommended annual dental exams for proper oral hygiene  Community Resource Referral / Chronic Care Management: CRR required this visit?  No   CCM required this visit?  No      Plan:     I have personally reviewed and noted the following in the patient's chart:   . Medical and social history . Use of alcohol, tobacco or illicit drugs  . Current medications and supplements . Functional ability and status . Nutritional  status . Physical activity . Advanced directives . List of other physicians . Hospitalizations, surgeries, and ER visits in previous 12 months . Vitals . Screenings to include cognitive, depression, and falls . Referrals and appointments  In addition, I have reviewed and discussed with patient certain preventive protocols, quality metrics, and best practice recommendations. A written personalized care plan for preventive services as well as general preventive health recommendations were provided to patient.     Lonn Georgia, LPN   [id number]   Nurse Notes: AWV conducted over the phone with pt consent to televisit via audio. Pt was in the home at the time of call and provider in the office. This call took approx 20 min.

## 2021-01-26 DIAGNOSIS — H2512 Age-related nuclear cataract, left eye: Secondary | ICD-10-CM | POA: Diagnosis not present

## 2021-01-26 DIAGNOSIS — H40052 Ocular hypertension, left eye: Secondary | ICD-10-CM | POA: Diagnosis not present

## 2021-01-26 DIAGNOSIS — H16222 Keratoconjunctivitis sicca, not specified as Sjogren's, left eye: Secondary | ICD-10-CM | POA: Diagnosis not present

## 2021-02-05 DIAGNOSIS — L820 Inflamed seborrheic keratosis: Secondary | ICD-10-CM | POA: Diagnosis not present

## 2021-02-05 DIAGNOSIS — Z1283 Encounter for screening for malignant neoplasm of skin: Secondary | ICD-10-CM | POA: Diagnosis not present

## 2021-02-05 DIAGNOSIS — D225 Melanocytic nevi of trunk: Secondary | ICD-10-CM | POA: Diagnosis not present

## 2021-02-06 DIAGNOSIS — H16222 Keratoconjunctivitis sicca, not specified as Sjogren's, left eye: Secondary | ICD-10-CM | POA: Diagnosis not present

## 2021-02-06 DIAGNOSIS — H2512 Age-related nuclear cataract, left eye: Secondary | ICD-10-CM | POA: Diagnosis not present

## 2021-02-06 DIAGNOSIS — H40052 Ocular hypertension, left eye: Secondary | ICD-10-CM | POA: Diagnosis not present

## 2021-02-22 ENCOUNTER — Ambulatory Visit: Payer: PPO | Admitting: Internal Medicine

## 2021-03-07 ENCOUNTER — Ambulatory Visit (INDEPENDENT_AMBULATORY_CARE_PROVIDER_SITE_OTHER): Payer: PPO | Admitting: Internal Medicine

## 2021-03-07 ENCOUNTER — Encounter: Payer: Self-pay | Admitting: Internal Medicine

## 2021-03-07 ENCOUNTER — Other Ambulatory Visit: Payer: Self-pay

## 2021-03-07 VITALS — BP 122/77 | HR 77 | Temp 98.3°F | Resp 18 | Ht 65.5 in | Wt 130.8 lb

## 2021-03-07 DIAGNOSIS — E782 Mixed hyperlipidemia: Secondary | ICD-10-CM | POA: Diagnosis not present

## 2021-03-07 DIAGNOSIS — R110 Nausea: Secondary | ICD-10-CM

## 2021-03-07 DIAGNOSIS — K5900 Constipation, unspecified: Secondary | ICD-10-CM | POA: Diagnosis not present

## 2021-03-07 DIAGNOSIS — I10 Essential (primary) hypertension: Secondary | ICD-10-CM

## 2021-03-07 NOTE — Assessment & Plan Note (Signed)
Now better Did not get H Pylori testing Not taking Omeprazole now, denies any heartburn currently

## 2021-03-07 NOTE — Assessment & Plan Note (Signed)
Better with Miralax now Was alternating with diarrhea in the past, could be related to IBS Proper hydration advised

## 2021-03-07 NOTE — Patient Instructions (Signed)
Please continue taking medications as prescribed.  Avoid hot and spicy food.  Continue to take stool softner for constipation.  Please get fasting blood tests before the next visit.

## 2021-03-07 NOTE — Assessment & Plan Note (Signed)
Not taking Crestor Has been taking other medication prescribed by other provider Advised to bring home medications in the next visit

## 2021-03-07 NOTE — Assessment & Plan Note (Signed)
BP Readings from Last 1 Encounters:  03/07/21 122/77   Well-controlled Counseled for compliance with the medications Advised DASH diet and moderate exercise/walking, at least 150 mins/week

## 2021-03-07 NOTE — Progress Notes (Signed)
Established Patient Office Visit  Subjective:  Patient ID: Kelsey Nelson, female    DOB: [birth year]  Age: 87 y.o. MRN: [id number]  CC:  Chief Complaint  Patient presents with  . Follow-up    6 week follow up pt has been having frequent urination no burning no itching     HPI [name] presents for follow up of her HTN and c/o nausea and constipation. She states that her nausea has improved now.  Her constipation improved with MiraLAX.  She denies any loose BM or watery diarrhea now.  She denies any melena or hematochezia.  Of note, she stopped taking omeprazole now.  She did not get H. pylori testing as her symptoms had resolved.  BP is well-controlled. Takes medications regularly. Patient denies headache, dizziness, chest pain, dyspnea or palpitations.   Past Medical History:  Diagnosis Date  . Acute cystitis with hematuria 10/26/2013  . Allergic rhinitis 11/08/2017  . Blindness of right eye   . BMI between 19-24,adult 2010 145 LBS  . Diverticula of small intestine Dec 2010  . Diverticulosis    [place]  . DIVERTICULOSIS, SIGMOID COLON 03/31/2008   Qualifier: Diagnosis of  By: Dierdre Harness  Noted on abdominal and pelvic scan in 04/2011.  Extensive colonic diverticulosis , and possible muscular hypertrophy at rectosigmoid junction   . Family hx of colon cancer 11/09/2013  . Functional constipation 2010  . GERD (gastroesophageal reflux disease)   . HTN (hypertension)   . Hyperlipemia   . Pancreatic cyst 02/05/2013  . Renal cyst 05/27/2011  . Right eye trauma 1949   Loss of vision resulted at age 51  . Schatzki's ring Dec 2010  . Skin lesions, generalized 06/19/2015  . Tubular adenoma 11/09/2013    Past Surgical History:  Procedure Laterality Date  . ABDOMINAL HYSTERECTOMY    . BREAST REDUCTION SURGERY  1992 BIL  . COLONOSCOPY  DEC 2010   SIMPLE ADENOMAS (6-8), [place] [facility], [facility]  . COLONOSCOPY N/A 12/08/2013   Procedure: COLONOSCOPY;  Surgeon: Rogene Houston, MD;  Location:  [facility];  Service: Endoscopy;  Laterality: N/A;  225  . EVISCERATION Right 05/08/2015   Procedure: EVISCERATION WITH IMPLANT RIGHT EYE;  Surgeon: Williams Che, MD;  Location: [facility];  Service: Ophthalmology;  Laterality: Right;  . EYE SURGERY  RIGHT 2005  . EYE SURGERY  right 2016   removed and false eye placed in 08/2015  . TOTAL ABDOMINAL HYSTERECTOMY W/ BILATERAL SALPINGOOPHORECTOMY  1985 FIBROIDS  . UPPER GASTROINTESTINAL ENDOSCOPY  DEC 2010   Bx-MILD GASTRITIS/DUODENITIS, DUODENAL DIVERTICULA, SCHATZKI'S RING    Family History  Problem Relation Age of Onset  . Pancreatic cancer Sister   . Colon cancer Paternal Aunt   . Colon polyps Neg Hx     Social History   Socioeconomic History  . Marital status: Married    Spouse name: [name]   . Number of children: 3  . Years of education: 87  . Highest education level: 12th grade  Occupational History  . Occupation: retired   Tobacco Use  . Smoking status: Never Smoker  . Smokeless tobacco: Never Used  Vaping Use  . Vaping Use: Never used  Substance and Sexual Activity  . Alcohol use: No    Alcohol/week: 0.0 standard drinks  . Drug use: No  . Sexual activity: Not Currently  Other Topics Concern  . Not on file  Social History Narrative   MARRIED TO DAVID Bessinger. RETIRED SCRETARY. NO  ETOH/   Social Determinants of Health   Financial Resource Strain: Low Risk   . Difficulty of Paying Living Expenses: Not very hard  Food Insecurity: No Food Insecurity  . Worried About Charity fundraiser in the Last Year: Never true  . Ran Out of Food in the Last Year: Never true  Transportation Needs: No Transportation Needs  . Lack of Transportation (Medical): No  . Lack of Transportation (Non-Medical): No  Physical Activity: Sufficiently Active  . Days of Exercise per Week: 5 days  . Minutes of Exercise per Session: 60 min  Stress: Stress Concern Present  . Feeling of Stress : To some extent  Social Connections: Moderately  Integrated  . Frequency of Communication with Friends and Family: Twice a week  . Frequency of Social Gatherings with Friends and Family: Twice a week  . Attends Religious Services: More than 4 times per year  . Active Member of Clubs or Organizations: No  . Attends Archivist Meetings: Never  . Marital Status: Married  Human resources officer Violence: Not At Risk  . Fear of Current or Ex-Partner: No  . Emotionally Abused: No  . Physically Abused: No  . Sexually Abused: No    Outpatient Medications Prior to Visit  Medication Sig Dispense Refill  . Calcium Carbonate Antacid (TUMS PO) Take by mouth. Per patient she takes as needed for acid reflux , heart burn. Maybe 2 a day.    . Cholecalciferol (VITAMIN D3 PO) Take 25 mcg by mouth.    . conjugated estrogens (PREMARIN) vaginal cream Apply one fingertip to introitus every 2 days. 42.5 g 5  . loperamide (IMODIUM) 2 MG capsule Take 1 capsule (2 mg total) by mouth as needed for diarrhea or loose stools. Take 2 capsules now, and 1 capsule with each loose stool (max 5 in a 24 hour period) 30 capsule 0  . LUMIGAN 0.01 % SOLN Place 1 drop into the left eye at bedtime.    . Multiple Vitamin (MULTI-VITAMIN DAILY PO) Take by mouth daily. doTerra Women -  Bone Nutireint    . OVER THE COUNTER MEDICATION daily. Mega Q Plus Max - Dr. Talmage Coin 's - Daily    . polyethylene glycol powder (GLYCOLAX/MIRALAX) 17 GM/SCOOP powder Take 17 g by mouth daily. 3350 g 1  . Probiotic Product (PROBIOTIC DAILY PO) Take by mouth daily.    . ramipril (ALTACE) 10 MG capsule Take 1 capsule (10 mg total) by mouth daily. 90 capsule 3  . betamethasone dipropionate 0.05 % cream Apply twice daily to rash on right leg for 10 days, then as needed (Patient not taking: Reported on 03/07/2021) 45 g 0  . imipramine (TOFRANIL) 10 MG tablet Take 1 tablet (10 mg total) by mouth at bedtime. (Patient not taking: Reported on 03/07/2021) 30 tablet 3  . Omega-3 Fatty Acids (OMEGA-3 PLUS PO)  Take by mouth. (Patient not taking: Reported on 03/07/2021)    . ondansetron (ZOFRAN-ODT) 8 MG disintegrating tablet Take 1 tablet (8 mg total) by mouth every 8 (eight) hours as needed for nausea or vomiting. (Patient not taking: Reported on 03/07/2021) 20 tablet 0  . rosuvastatin (CRESTOR) 5 MG tablet Take 1 tablet (5 mg total) by mouth daily. (Patient not taking: Reported on 03/07/2021) 30 tablet 5  . Simethicone (GAS-X PO) Take by mouth. Per Patient she takes a couple (2) a day. (Patient not taking: Reported on 03/07/2021)    . omeprazole (PRILOSEC) 20 MG capsule Take 1 capsule (20  mg total) by mouth daily. (Patient not taking: Reported on 03/07/2021) 30 capsule 3   Facility-Administered Medications Prior to Visit  Medication Dose Route Frequency Provider Last Rate Last Admin  . hyoscyamine (LEVSIN SL) SL tablet 0.125 mg  0.125 mg Sublingual Q6H PRN Laurine Blazer B, PA-C        Allergies  Allergen Reactions  . Codeine   . Fosamax [Alendronate Sodium]     heartburn    ROS Review of Systems  Constitutional: Negative for chills and fever.  HENT: Negative for congestion, sinus pressure, sinus pain and sore throat.   Eyes: Negative for pain and discharge.  Respiratory: Negative for cough and shortness of breath.   Cardiovascular: Negative for chest pain and palpitations.  Gastrointestinal: Negative for abdominal pain, constipation, diarrhea, nausea and vomiting.  Endocrine: Negative for polydipsia and polyuria.  Genitourinary: Negative for dysuria and hematuria.       Nocturia  Musculoskeletal: Negative for neck pain and neck stiffness.  Skin: Negative for rash.  Neurological: Negative for dizziness and weakness.  Psychiatric/Behavioral: Negative for agitation and behavioral problems.      Objective:    Physical Exam Vitals reviewed.  Constitutional:      General: She is not in acute distress.    Appearance: She is not diaphoretic.  HENT:     Head: Normocephalic and atraumatic.      Nose: Nose normal. No congestion.     Mouth/Throat:     Mouth: Mucous membranes are moist.     Pharynx: No posterior oropharyngeal erythema.  Eyes:     General: No scleral icterus.    Extraocular Movements: Extraocular movements intact.     Pupils: Pupils are equal, round, and reactive to light.  Cardiovascular:     Rate and Rhythm: Normal rate and regular rhythm.     Pulses: Normal pulses.     Heart sounds: Normal heart sounds. No murmur heard.   Pulmonary:     Breath sounds: Normal breath sounds. No wheezing or rales.  Abdominal:     Palpations: Abdomen is soft.     Tenderness: There is no abdominal tenderness. There is no guarding or rebound.  Musculoskeletal:     Cervical back: Neck supple. No tenderness.     Right lower leg: No edema.     Left lower leg: No edema.  Skin:    General: Skin is warm.     Findings: No rash.  Neurological:     General: No focal deficit present.     Mental Status: She is alert and oriented to person, place, and time.  Psychiatric:        Mood and Affect: Mood normal.        Behavior: Behavior normal.     BP 122/77 (BP Location: Right Arm, Patient Position: Sitting, Cuff Size: Normal)   Pulse 77   Temp 98.3 F (36.8 C) (Oral)   Resp 18   Ht 5' 5.5" (1.664 m)   Wt 130 lb 12.8 oz (59.3 kg)   SpO2 98%   BMI 21.44 kg/m  Wt Readings from Last 3 Encounters:  03/07/21 130 lb 12.8 oz (59.3 kg)  01/10/21 124 lb (56.2 kg)  12/20/20 129 lb (58.5 kg)     Health Maintenance Due  Topic Date Due  . COVID-19 Vaccine (2 - Booster for Janssen series) 07/19/2020    There are no preventive care reminders to display for this patient.  Lab Results  Component Value Date   TSH 2.810  08/04/2020   Lab Results  Component Value Date   WBC 5.2 08/04/2020   HGB 14.0 08/04/2020   HCT 41.4 08/04/2020   MCV 90 08/04/2020   PLT 297 08/04/2020   Lab Results  Component Value Date   NA 141 08/04/2020   K 4.4 08/04/2020   CO2 25 08/04/2020    GLUCOSE 96 08/04/2020   BUN 15 08/04/2020   CREATININE 0.96 08/04/2020   BILITOT 0.4 08/04/2020   ALKPHOS 127 (H) 08/04/2020   AST 26 08/04/2020   ALT 18 08/04/2020   PROT 7.0 08/04/2020   ALBUMIN 4.3 08/04/2020   CALCIUM 9.8 08/04/2020   ANIONGAP 8 12/04/2018   Lab Results  Component Value Date   CHOL 311 (H) 08/04/2020   Lab Results  Component Value Date   HDL 99 08/04/2020   Lab Results  Component Value Date   LDLCALC 185 (H) 08/04/2020   Lab Results  Component Value Date   TRIG 151 (H) 08/04/2020   Lab Results  Component Value Date   CHOLHDL 3.1 08/04/2020   Lab Results  Component Value Date   HGBA1C 5.6 09/03/2016      Assessment & Plan:   Problem List Items Addressed This Visit      Cardiovascular and Mediastinum   HTN (hypertension) - Primary    BP Readings from Last 1 Encounters:  03/07/21 122/77   Well-controlled Counseled for compliance with the medications Advised DASH diet and moderate exercise/walking, at least 150 mins/week       Relevant Orders   CBC   BASIC METABOLIC PANEL WITH GFR   Hemoglobin A1c     Other   Mixed hyperlipidemia    Not taking Crestor Has been taking other medication prescribed by other provider Advised to bring home medications in the next visit      Relevant Orders   BASIC METABOLIC PANEL WITH GFR   Lipid Profile   Constipation    Better with Miralax now Was alternating with diarrhea in the past, could be related to IBS Proper hydration advised      Nausea    Now better Did not get H Pylori testing Not taking Omeprazole now, denies any heartburn currently         No orders of the defined types were placed in this encounter.   Follow-up: Return in about 3 months (around 06/07/2021) for HTN and blood tests review.    Lindell Spar, MD

## 2021-03-13 ENCOUNTER — Other Ambulatory Visit: Payer: Self-pay

## 2021-03-13 ENCOUNTER — Ambulatory Visit (INDEPENDENT_AMBULATORY_CARE_PROVIDER_SITE_OTHER): Payer: PPO | Admitting: Internal Medicine

## 2021-03-13 ENCOUNTER — Encounter (INDEPENDENT_AMBULATORY_CARE_PROVIDER_SITE_OTHER): Payer: Self-pay | Admitting: Internal Medicine

## 2021-03-13 VITALS — BP 120/68 | HR 84 | Temp 98.9°F | Ht 65.5 in | Wt 129.7 lb

## 2021-03-13 DIAGNOSIS — K5900 Constipation, unspecified: Secondary | ICD-10-CM

## 2021-03-13 DIAGNOSIS — K219 Gastro-esophageal reflux disease without esophagitis: Secondary | ICD-10-CM

## 2021-03-13 MED ORDER — DOCUSATE SODIUM 100 MG PO CAPS: 200.0000 mg | ORAL_CAPSULE | Freq: Every day | ORAL | 0 refills | Status: DC

## 2021-03-13 MED ORDER — FAMOTIDINE 20 MG PO TABS: 20.0000 mg | ORAL_TABLET | Freq: Every day | ORAL | Status: DC

## 2021-03-13 NOTE — Progress Notes (Signed)
Presenting complaint;  Follow-up for GERD and constipation.  Database and subjective:  Patient is 87 year old Caucasian female who was history of nausea recurrent belching GERD and constipation who is here for scheduled visit.  She was last seen in April last year. She says frequency and severity of burping has decreased significantly.  She does not experience abdominal pain nausea or vomiting with burping.  She remains with good appetite.  Lately she has been experiencing heartburn after evening meal almost daily.  She is using Tums every night usually 2 tablets a day. She also complains of constipation.  She used to have daily bowel movement.  Around Christmas time she went 1 week without a bowel movement.  Thereafter she noted in caliber to her stools but now the caliber is back to normal.  She did notice black stool but she denies rectal bleeding.  She says she had Covid back in March 2021.  Taste sensation has not fully recovered. She remains very active.  She walks 3 miles every day.  Her weight has gone up by 3 pounds.  She had colonoscopy in December 2014 with removal of a small tubular adenoma from transverse colon and she also had sigmoid colon diverticulosis.  Current Medications: Outpatient Encounter Medications as of 03/13/2021  Medication Sig  . B Complex Vitamins (VITAMIN-B COMPLEX PO) Take by mouth daily. With Vitamin B12 included.  . Calcium Carbonate Antacid (TUMS PO) Take by mouth. Per patient she takes as needed for acid reflux , heart burn. Maybe 2 a day.  . Calcium-Magnesium-Vitamin D (CALCIUM 1200+D3 PO) Take by mouth daily.  . Cholecalciferol (VITAMIN D3 PO) Take 25 mcg by mouth.  . conjugated estrogens (PREMARIN) vaginal cream Apply one fingertip to introitus every 2 days.  . Ginger, Zingiber officinalis, (GINGER ROOT) 550 MG CAPS Take by mouth daily.  [name] LUMIGAN 0.01 % SOLN Place 1 drop into the left eye at bedtime.  . Magnesium 400 MG CAPS Take by mouth daily.  .  multivitamin-lutein (OCUVITE-LUTEIN) CAPS capsule Take 1 capsule by mouth daily.  [name] OVER THE COUNTER MEDICATION Multi Collagen Protein - Patient states that she takes 1 scoop a day.  . Probiotic Product (PROBIOTIC DAILY PO) Take by mouth daily.  . ramipril (ALTACE) 10 MG capsule Take 1 capsule (10 mg total) by mouth daily.  . Simethicone (GAS-X PO) Take by mouth. Per Patient she takes a couple (2) a day.  . vitamin C (ASCORBIC ACID) 500 MG tablet Take 500 mg by mouth daily.  . Zinc 50 MG TABS Take by mouth daily.  . polyethylene glycol powder (GLYCOLAX/MIRALAX) 17 GM/SCOOP powder Take 17 g by mouth daily. (Patient not taking: Reported on 03/13/2021)  . [DISCONTINUED] betamethasone dipropionate 0.05 % cream Apply twice daily to rash on right leg for 10 days, then as needed (Patient not taking: No sig reported)  . [DISCONTINUED] imipramine (TOFRANIL) 10 MG tablet Take 1 tablet (10 mg total) by mouth at bedtime. (Patient not taking: No sig reported)  . [DISCONTINUED] loperamide (IMODIUM) 2 MG capsule Take 1 capsule (2 mg total) by mouth as needed for diarrhea or loose stools. Take 2 capsules now, and 1 capsule with each loose stool (max 5 in a 24 hour period) (Patient not taking: Reported on 03/13/2021)  . [DISCONTINUED] Multiple Vitamin (MULTI-VITAMIN DAILY PO) Take by mouth daily. doTerra Women -  Bone Nutireint (Patient not taking: Reported on 03/13/2021)  . [DISCONTINUED] ondansetron (ZOFRAN-ODT) 8 MG disintegrating tablet Take 1 tablet (8 mg total) by  mouth every 8 (eight) hours as needed for nausea or vomiting. (Patient not taking: No sig reported)  . [DISCONTINUED] OVER THE COUNTER MEDICATION daily. Mega Q Plus Max - Dr. Talmage Coin 's - Daily (Patient not taking: Reported on 03/13/2021)  . [DISCONTINUED] rosuvastatin (CRESTOR) 5 MG tablet Take 1 tablet (5 mg total) by mouth daily. (Patient not taking: No sig reported)    Objective: Blood pressure 120/68, pulse 84, temperature 98.9 F (37.2 C),  temperature source Oral, height 5' 5.5" (1.664 m), weight 129 lb 11.2 oz (58.8 kg). Patient is alert and in no acute distress. She is wearing a mask. Right eye is prosthetic. Conjunctiva is pink. Sclera is nonicteric Oropharyngeal mucosa is normal. No neck masses or thyromegaly noted. Cardiac exam with regular rhythm normal S1 and S2. No murmur or gallop noted. Lungs are clear to auscultation. Abdomen abdomen is soft and nontender with organomegaly or masses. No LE edema or clubbing noted.  Labs/studies Results:  CBC Latest Ref Rng & Units 08/04/2020 06/08/2019 12/04/2018  WBC 3.4 - 10.8 x10E3/uL 5.2 4.9 3.1(L)  Hemoglobin 11.1 - 15.9 g/dL 14.0 14.1 13.3  Hematocrit 34.0 - 46.6 % 41.4 42.7 41.5  Platelets 150 - 450 x10E3/uL 297 263 178    CMP Latest Ref Rng & Units 08/04/2020 10/01/2019 06/08/2019  Glucose 65 - 99 mg/dL 96 98 88  BUN 8 - 27 mg/dL $Remove'15 16 16  'mmZuLqK$ Creatinine 0.57 - 1.00 mg/dL 0.96 0.96(H) 0.87  Sodium 134 - 144 mmol/L 141 139 140  Potassium 3.5 - 5.2 mmol/L 4.4 4.2 4.0  Chloride 96 - 106 mmol/L 101 102 102  CO2 20 - 29 mmol/L 25 31 32  Calcium 8.7 - 10.3 mg/dL 9.8 9.8 9.9  Total Protein 6.0 - 8.5 g/dL 7.0 6.7 6.9  Total Bilirubin 0.0 - 1.2 mg/dL 0.4 0.6 0.5  Alkaline Phos 48 - 121 IU/L 127(H) - -  AST 0 - 40 IU/L $Remov'26 25 23  'JNXWTR$ ALT 0 - 32 IU/L $Remov'18 19 17    'elcNAB$ Hepatic Function Latest Ref Rng & Units 08/04/2020 10/01/2019 06/08/2019  Total Protein 6.0 - 8.5 g/dL 7.0 6.7 6.9  Albumin 3.6 - 4.6 g/dL 4.3 - -  AST 0 - 40 IU/L $Remov'26 25 23  'jdxeKW$ ALT 0 - 32 IU/L $Remov'18 19 17  'BafiJA$ Alk Phosphatase 48 - 121 IU/L 127(H) - -  Total Bilirubin 0.0 - 1.2 mg/dL 0.4 0.6 0.5  Bilirubin, Direct <=0.2 mg/dL - - -    Blood work from 08/04/2020 reviewed.  Borderline alkaline phosphatase otherwise normal.  Assessment:  #1.  GERD.  She is having postprandial heartburn every evening after supper.  She is having to use Tums.  She would benefit from low-dose histamine 2 blocker so that we can prevent her from having  heartburn.  She does not have any alarm symptoms.  No indication for EGD.  #2.  Constipation.  Last colonoscopy was in December 2014 with removal of small tubular adenoma.  Given her age was decided not to pursue with future colonoscopies.  Plan:  High-fiber diet. Famotidine 20 mg by mouth daily before evening meal. Colace 200 mg by mouth daily at bedtime. Hemoccult x1 if stool dark/black. Patient can use Dulcolax or glycerin suppositories if she goes more than 1 day without a bowel movement. Office visit in 1 year.

## 2021-03-13 NOTE — Patient Instructions (Addendum)
Can use glycerin and/or Dulcolax suppository on as-needed basis Notify if Pepcid/famotidine OTC does not control heartburn. Hemoccult x1 if stool is black

## 2021-04-10 DIAGNOSIS — H43812 Vitreous degeneration, left eye: Secondary | ICD-10-CM | POA: Diagnosis not present

## 2021-05-10 ENCOUNTER — Other Ambulatory Visit: Payer: Self-pay

## 2021-05-10 ENCOUNTER — Telehealth (INDEPENDENT_AMBULATORY_CARE_PROVIDER_SITE_OTHER): Payer: PPO | Admitting: Family Medicine

## 2021-05-10 ENCOUNTER — Encounter: Payer: Self-pay | Admitting: Family Medicine

## 2021-05-10 DIAGNOSIS — F5101 Primary insomnia: Secondary | ICD-10-CM

## 2021-05-10 DIAGNOSIS — N390 Urinary tract infection, site not specified: Secondary | ICD-10-CM

## 2021-05-10 DIAGNOSIS — N3000 Acute cystitis without hematuria: Secondary | ICD-10-CM | POA: Diagnosis not present

## 2021-05-10 LAB — POCT URINALYSIS DIP (CLINITEK)
Bilirubin, UA: NEGATIVE
Blood, UA: NEGATIVE
Glucose, UA: NEGATIVE mg/dL
Ketones, POC UA: NEGATIVE mg/dL
Nitrite, UA: NEGATIVE
POC PROTEIN,UA: NEGATIVE
Spec Grav, UA: 1.02 (ref 1.010–1.025)
Urobilinogen, UA: 0.2 E.U./dL
pH, UA: 7.5 (ref 5.0–8.0)

## 2021-05-10 MED ORDER — TRAZODONE HCL 50 MG PO TABS: 25.0000 mg | ORAL_TABLET | Freq: Every evening | ORAL | 3 refills | Status: DC | PRN

## 2021-05-10 NOTE — Progress Notes (Signed)
Virtual Visit via Telephone Note  I connected with Kelsey Nelson on 05/10/21 at  8:20 AM EDT by telephone and verified that I am speaking with the correct person using two identifiers.  Location: Patient: home Provider: office   I discussed the limitations, risks, security and privacy concerns of performing an evaluation and management service by telephone and the availability of in person appointments. I also discussed with the patient that there may be a patient responsible charge related to this service. The patient expressed understanding and agreed to proceed.   History of Present Illness:   Using premarin on avg once weekly for dysuria, one application generally addresses this, whoever she used the cream last night and today relief is not as good as usual, thinks she has a UTI, and states that despite Urology evaluation, she continus to have recurrent dysuria with UTI. Is not using premarin cream as directed and I advise her to increase frequency as this will help to alleviate her symptoms Frustrated and wants this re addressed, will return to Urologist who saw her recently C/o no sleep, has been advised that formal sleep eval may be beneficial, denies snoring, difficulty is staying asleep, will try trazodone and refer her to neurology   Observations/Objective: There were no vitals taken for this visit. Good communication with no confusion and intact memory. Alert and oriented x 3 No signs of respiratory distress during speech    Assessment and Plan:  Acute cystitis without hematuria Symptomatic with abn CCUA and h/o recurrent UTI, treat presumptively and f/u c/s  Recurrent UTI Possiblety 3 documented UTI in past 6 months, refer to Urology for further eval.  Insomnia Sleep hygiene reviewed and written information offered also. Prescription sent for  medication needed. Refer for formal sleep eval   Follow Up Instructions:    I discussed the assessment and treatment  plan with the patient. The patient was provided an opportunity to ask questions and all were answered. The patient agreed with the plan and demonstrated an understanding of the instructions.   The patient was advised to call back or seek an in-person evaluation if the symptoms worsen or if the condition fails to improve as anticipated.  I provided 18 minutes of non-face-to-face time during this encounter.   Tula Nakayama, MD

## 2021-05-10 NOTE — Assessment & Plan Note (Signed)
Symptomatic with abn CCUA and h/o recurrent UTI, treat presumptively and f/u c/s

## 2021-05-10 NOTE — Patient Instructions (Addendum)
F/u as before, call if you need me sooner  Short antibiotic course prescribed and you are referred to Urology   Start trazodone for sleep and you are referred for sleep study/ evaluation with dr Merlene Laughter  Thanks for choosing [facility], we consider it a privelige to serve you.

## 2021-05-10 NOTE — Assessment & Plan Note (Signed)
Sleep hygiene reviewed and written information offered also. Prescription sent for  medication needed. Refer for formal sleep eval

## 2021-05-10 NOTE — Assessment & Plan Note (Signed)
Possiblety 3 documented UTI in past 6 months, refer to Urology for further eval.

## 2021-05-16 ENCOUNTER — Other Ambulatory Visit: Payer: Self-pay

## 2021-05-16 LAB — URINE CULTURE

## 2021-05-16 MED ORDER — NITROFURANTOIN MONOHYD MACRO 100 MG PO CAPS: 100.0000 mg | ORAL_CAPSULE | Freq: Two times a day (BID) | ORAL | 0 refills | Status: DC

## 2021-05-17 DIAGNOSIS — H25042 Posterior subcapsular polar age-related cataract, left eye: Secondary | ICD-10-CM | POA: Diagnosis not present

## 2021-05-17 DIAGNOSIS — H401121 Primary open-angle glaucoma, left eye, mild stage: Secondary | ICD-10-CM | POA: Diagnosis not present

## 2021-05-17 DIAGNOSIS — H25012 Cortical age-related cataract, left eye: Secondary | ICD-10-CM | POA: Diagnosis not present

## 2021-05-17 DIAGNOSIS — H2512 Age-related nuclear cataract, left eye: Secondary | ICD-10-CM | POA: Diagnosis not present

## 2021-06-04 DIAGNOSIS — I10 Essential (primary) hypertension: Secondary | ICD-10-CM | POA: Diagnosis not present

## 2021-06-04 DIAGNOSIS — F5104 Psychophysiologic insomnia: Secondary | ICD-10-CM | POA: Diagnosis not present

## 2021-06-04 DIAGNOSIS — G4733 Obstructive sleep apnea (adult) (pediatric): Secondary | ICD-10-CM | POA: Diagnosis not present

## 2021-06-04 DIAGNOSIS — H269 Unspecified cataract: Secondary | ICD-10-CM | POA: Diagnosis not present

## 2021-06-07 ENCOUNTER — Ambulatory Visit: Payer: PPO | Admitting: Family Medicine

## 2021-06-13 DIAGNOSIS — H2512 Age-related nuclear cataract, left eye: Secondary | ICD-10-CM | POA: Diagnosis not present

## 2021-07-04 ENCOUNTER — Other Ambulatory Visit: Payer: Self-pay

## 2021-07-04 ENCOUNTER — Encounter: Payer: Self-pay | Admitting: Urology

## 2021-07-04 ENCOUNTER — Ambulatory Visit: Payer: PPO | Admitting: Urology

## 2021-07-04 VITALS — BP 143/79 | HR 63 | Temp 97.8°F

## 2021-07-04 DIAGNOSIS — N3001 Acute cystitis with hematuria: Secondary | ICD-10-CM

## 2021-07-04 DIAGNOSIS — R351 Nocturia: Secondary | ICD-10-CM | POA: Diagnosis not present

## 2021-07-04 DIAGNOSIS — N3281 Overactive bladder: Secondary | ICD-10-CM | POA: Diagnosis not present

## 2021-07-04 LAB — BLADDER SCAN AMB NON-IMAGING: Scan Result: 99

## 2021-07-04 MED ORDER — NITROFURANTOIN MACROCRYSTAL 50 MG PO CAPS: 50.0000 mg | ORAL_CAPSULE | Freq: Every day | ORAL | 11 refills | Status: DC

## 2021-07-04 MED ORDER — MIRABEGRON ER 25 MG PO TB24: 25.0000 mg | ORAL_TABLET | Freq: Every day | ORAL | 0 refills | Status: DC

## 2021-07-04 NOTE — Progress Notes (Signed)
post void residual= 99 ?

## 2021-07-04 NOTE — Progress Notes (Signed)
Urological Symptom Review Pvr =21ml  Patient is experiencing the following symptoms: Frequent urination Hard to postpone urination Get up at night to urinate Urinary tract infection   Review of Systems  Gastrointestinal (upper)  : Indigestion/heartburn  Gastrointestinal (lower) : Negative for lower GI symptoms  Constitutional : Negative for symptoms  Skin: Negative for skin symptoms  Eyes: Negative for eye symptoms  Ear/Nose/Throat : Negative for Ear/Nose/Throat symptoms  Hematologic/Lymphatic: Negative for Hematologic/Lymphatic symptoms  Cardiovascular : Negative for cardiovascular symptoms  Respiratory : Negative for respiratory symptoms  Endocrine: Negative for endocrine symptoms  Musculoskeletal: Negative for musculoskeletal symptoms  Neurological: Negative for neurological symptoms  Psychologic: Negative for psychiatric symptoms

## 2021-07-04 NOTE — Patient Instructions (Signed)

## 2021-07-04 NOTE — Progress Notes (Signed)
07/04/2021 10:32 AM   Kelsey Nelson 1934-05-10 [id number]  Referring provider: Fayrene Helper, MD [address] [place],  [place] [address]  Recurrent UTI   HPI: Kelsey Nelson is a 87yo here for followup for recurrent UTI. She was last seen in 09/2020. She has had 5 UTIs since alst visit. Last culture result was Klebsiella sensitive to macrobid. She uses premarin 3x per week which helps with vaginal irritation but has not prevented her UTIs. She also has urinary frequency every 2 hours, nocturia 5-6x, and urinary urgency without urge incontinence. She is supposed to have a sleep study.    PMH: Past Medical History:  Diagnosis Date   Acute cystitis with hematuria 10/26/2013   Allergic rhinitis 11/08/2017   Blindness of right eye    BMI between 19-24,adult 2010 145 LBS   Diverticula of small intestine Dec 2010   Diverticulosis    [place]   DIVERTICULOSIS, SIGMOID COLON 03/31/2008   Qualifier: Diagnosis of  By: Dierdre Harness  Noted on abdominal and pelvic scan in 04/2011.  Extensive colonic diverticulosis , and possible muscular hypertrophy at rectosigmoid junction    Family hx of colon cancer 11/09/2013   Functional constipation 2010   GERD (gastroesophageal reflux disease)    HTN (hypertension)    Hyperlipemia    Pancreatic cyst 02/05/2013   Renal cyst 05/27/2011   Right eye trauma 1949   Loss of vision resulted at age 29   Schatzki's ring Dec 2010   Skin lesions, generalized 06/19/2015   Tubular adenoma 11/09/2013    Surgical History: Past Surgical History:  Procedure Laterality Date   ABDOMINAL HYSTERECTOMY     BREAST REDUCTION SURGERY  1992 BIL   COLONOSCOPY  DEC 2010   SIMPLE ADENOMAS (6-8), [place] [facility], [facility]   COLONOSCOPY N/A 12/08/2013   Procedure: COLONOSCOPY;  Surgeon: Rogene Houston, MD;  Location: [facility];  Service: Endoscopy;  Laterality: N/A;  225   EVISCERATION Right 05/08/2015   Procedure: EVISCERATION WITH IMPLANT RIGHT EYE;  Surgeon: Williams Che, MD;  Location: [facility];  Service: Ophthalmology;  Laterality: Right;   EYE SURGERY  RIGHT 2005   EYE SURGERY  right 2016   removed and false eye placed in 08/2015   TOTAL ABDOMINAL HYSTERECTOMY W/ BILATERAL SALPINGOOPHORECTOMY  1985 FIBROIDS   UPPER GASTROINTESTINAL ENDOSCOPY  DEC 2010   Bx-MILD GASTRITIS/DUODENITIS, DUODENAL DIVERTICULA, SCHATZKI'S RING    Home Medications:  Allergies as of 07/04/2021       Reactions   Codeine    Fosamax [alendronate Sodium]    heartburn        Medication List        Accurate as of July 04, 2021 10:32 AM. If you have any questions, ask your nurse or doctor.          CALCIUM 1200+D3 PO Take by mouth daily.   conjugated estrogens vaginal cream Commonly known as: PREMARIN Apply one fingertip to introitus every 2 days.   docusate sodium 100 MG capsule Commonly known as: Colace Take 2 capsules (200 mg total) by mouth daily.   famotidine 20 MG tablet Commonly known as: Pepcid Take 1 tablet (20 mg total) by mouth daily before supper.   GAS-X PO Take by mouth. Per Patient she takes a couple (2) a day.   Ginger Root 550 MG Caps Take by mouth daily.   Lumigan 0.01 % Soln Generic drug: bimatoprost Place 1 drop into the left eye at  bedtime.   Magnesium 400 MG Caps Take by mouth daily.   multivitamin-lutein Caps capsule Take 1 capsule by mouth daily.   nitrofurantoin (macrocrystal-monohydrate) 100 MG capsule Commonly known as: Macrobid Take 1 capsule (100 mg total) by mouth 2 (two) times daily.   OVER THE COUNTER MEDICATION Multi Collagen Protein - Patient states that she takes 1 scoop a day.   PROBIOTIC DAILY PO Take by mouth daily.   ramipril 10 MG capsule Commonly known as: ALTACE Take 1 capsule (10 mg total) by mouth daily.   traZODone 50 MG tablet Commonly known as: DESYREL Take 0.5-1 tablets (25-50 mg total) by mouth at bedtime as needed for sleep.   TUMS PO Take by mouth. Per patient she takes as needed  for acid reflux , heart burn. Maybe 2 a day.   vitamin C 500 MG tablet Commonly known as: ASCORBIC ACID Take 500 mg by mouth daily.   VITAMIN D3 PO Take 25 mcg by mouth.   VITAMIN-B COMPLEX PO Take by mouth daily. With Vitamin B12 included.   Zinc 50 MG Tabs Take by mouth daily.        Allergies:  Allergies  Allergen Reactions   Codeine    Fosamax [Alendronate Sodium]     heartburn    Family History: Family History  Problem Relation Age of Onset   Pancreatic cancer Sister    Colon cancer Paternal Aunt    Colon polyps Neg Hx     Social History:  reports that she has never smoked. She has never used smokeless tobacco. She reports that she does not drink alcohol and does not use drugs.  ROS: All other review of systems were reviewed and are negative except what is noted above in HPI  Physical Exam: BP (!) 143/79   Pulse 63   Temp 97.8 F (36.6 C)   Constitutional:  Alert and oriented, No acute distress. HEENT: [place] AT, moist mucus membranes.  Trachea midline, no masses. Cardiovascular: No clubbing, cyanosis, or edema. Respiratory: Normal respiratory effort, no increased work of breathing. GI: Abdomen is soft, nontender, nondistended, no abdominal masses GU: No CVA tenderness.  Lymph: No cervical or inguinal lymphadenopathy. Skin: No rashes, bruises or suspicious lesions. Neurologic: Grossly intact, no focal deficits, moving all 4 extremities. Psychiatric: Normal mood and affect.  Laboratory Data: Lab Results  Component Value Date   WBC 5.2 08/04/2020   HGB 14.0 08/04/2020   HCT 41.4 08/04/2020   MCV 90 08/04/2020   PLT 297 08/04/2020    Lab Results  Component Value Date   CREATININE 0.96 08/04/2020    No results found for: PSA  No results found for: TESTOSTERONE  Lab Results  Component Value Date   HGBA1C 5.6 09/03/2016    Urinalysis    Component Value Date/Time   COLORURINE STRAW (A) 12/04/2018 1058   APPEARANCEUR Cloudy (A) 10/05/2020  1128   LABSPEC 1.003 (L) 12/04/2018 1058   PHURINE 7.0 12/04/2018 1058   GLUCOSEU Negative 10/05/2020 1128   HGBUR SMALL (A) 12/04/2018 1058   BILIRUBINUR negative 05/10/2021 0823   BILIRUBINUR negative 10/19/2020 1137   BILIRUBINUR Negative 10/05/2020 1128   KETONESUR negative 05/10/2021 0823   KETONESUR NEGATIVE 12/04/2018 1058   PROTEINUR Negative 10/19/2020 1137   PROTEINUR Negative 10/05/2020 1128   PROTEINUR NEGATIVE 12/04/2018 1058   UROBILINOGEN 0.2 05/10/2021 0823   UROBILINOGEN 0.2 07/14/2009 2018   NITRITE Negative 05/10/2021 0823   NITRITE negative 10/19/2020 1137   NITRITE Positive (A) 10/05/2020 1128  NITRITE NEGATIVE 12/04/2018 1058   LEUKOCYTESUR Small (1+) (A) 05/10/2021 0823   LEUKOCYTESUR Trace (A) 10/05/2020 1128    Lab Results  Component Value Date   LABMICR See below: 10/05/2020   WBCUA 6-10 (A) 10/05/2020   LABEPIT 0-10 10/05/2020   BACTERIA Many (A) 10/05/2020    Pertinent Imaging:  Results for orders placed during the hospital encounter of 07/31/12  DG Abd 1 View  Narrative *RADIOLOGY REPORT*  Clinical Data: History of constipation.  Abdominal pain.  ABDOMEN - 1 VIEW  Comparison: 06/21/2011  Findings: Moderate stool burden within the colon, slightly decreased in the left colon since prior study.  No evidence of bowel obstruction.  No free air.  No organomegaly or acute bony abnormality.  Calcified phleboliths in the anatomic pelvis.  IMPRESSION: Moderate stool burden, decreased from prior study.  No acute findings.  Original Report Authenticated By: Raelyn Number, M.D.  No results found for this or any previous visit.  No results found for this or any previous visit.  No results found for this or any previous visit.  No results found for this or any previous visit.  No results found for this or any previous visit.  No results found for this or any previous visit.  No results found for this or any previous  visit.   Assessment & Plan:    1. Acute cystitis with hematuria -Urine for culture, will call with results - Urinalysis, Routine w reflex microscopic - BLADDER SCAN AMB NON-IMAGING  2. Overactive bladder -Mirabegron 25mg  daily   No follow-ups on file.  Nicolette Bang, MD  [facility] [place]

## 2021-07-10 ENCOUNTER — Ambulatory Visit: Payer: PPO | Admitting: Family Medicine

## 2021-07-11 ENCOUNTER — Other Ambulatory Visit (HOSPITAL_BASED_OUTPATIENT_CLINIC_OR_DEPARTMENT_OTHER): Payer: Self-pay

## 2021-07-11 DIAGNOSIS — G4733 Obstructive sleep apnea (adult) (pediatric): Secondary | ICD-10-CM

## 2021-07-13 DIAGNOSIS — Z4420 Encounter for fitting and adjustment of artificial eye, unspecified: Secondary | ICD-10-CM | POA: Diagnosis not present

## 2021-07-22 ENCOUNTER — Other Ambulatory Visit: Payer: Self-pay

## 2021-07-22 ENCOUNTER — Ambulatory Visit: Payer: PPO | Attending: Neurology | Admitting: Neurology

## 2021-07-22 DIAGNOSIS — G4733 Obstructive sleep apnea (adult) (pediatric): Secondary | ICD-10-CM | POA: Diagnosis not present

## 2021-07-22 DIAGNOSIS — Z79899 Other long term (current) drug therapy: Secondary | ICD-10-CM | POA: Diagnosis not present

## 2021-07-24 ENCOUNTER — Encounter: Payer: Self-pay | Admitting: Family Medicine

## 2021-07-24 ENCOUNTER — Other Ambulatory Visit: Payer: Self-pay

## 2021-07-24 ENCOUNTER — Ambulatory Visit (INDEPENDENT_AMBULATORY_CARE_PROVIDER_SITE_OTHER): Payer: PPO | Admitting: Family Medicine

## 2021-07-24 VITALS — BP 112/72 | HR 66 | Ht 66.0 in | Wt 124.4 lb

## 2021-07-24 DIAGNOSIS — I10 Essential (primary) hypertension: Secondary | ICD-10-CM

## 2021-07-24 DIAGNOSIS — R3915 Urgency of urination: Secondary | ICD-10-CM | POA: Diagnosis not present

## 2021-07-24 DIAGNOSIS — E785 Hyperlipidemia, unspecified: Secondary | ICD-10-CM | POA: Diagnosis not present

## 2021-07-24 DIAGNOSIS — E559 Vitamin D deficiency, unspecified: Secondary | ICD-10-CM

## 2021-07-24 DIAGNOSIS — N390 Urinary tract infection, site not specified: Secondary | ICD-10-CM

## 2021-07-24 DIAGNOSIS — G4700 Insomnia, unspecified: Secondary | ICD-10-CM

## 2021-07-24 DIAGNOSIS — R5383 Other fatigue: Secondary | ICD-10-CM

## 2021-07-24 DIAGNOSIS — E782 Mixed hyperlipidemia: Secondary | ICD-10-CM

## 2021-07-24 NOTE — Patient Instructions (Addendum)
Annual exam with MD , Nov 3 or after , call if you need me sooner  Call and come for flu vaccine next month  Please get your shingrix vaccines at the pharmacy  Labs today, cBC, lipid, cmp and EGFR, TSH and vit D  Careful not to fall  We will work on seeing if we can find a solution for poor sleep  Thanks for choosing [facility], we consider it a privelige to serve you.

## 2021-07-27 DIAGNOSIS — G4700 Insomnia, unspecified: Secondary | ICD-10-CM | POA: Diagnosis not present

## 2021-07-27 DIAGNOSIS — I10 Essential (primary) hypertension: Secondary | ICD-10-CM | POA: Diagnosis not present

## 2021-07-27 DIAGNOSIS — E559 Vitamin D deficiency, unspecified: Secondary | ICD-10-CM | POA: Diagnosis not present

## 2021-07-27 DIAGNOSIS — E785 Hyperlipidemia, unspecified: Secondary | ICD-10-CM | POA: Diagnosis not present

## 2021-07-27 DIAGNOSIS — E782 Mixed hyperlipidemia: Secondary | ICD-10-CM | POA: Diagnosis not present

## 2021-07-27 DIAGNOSIS — R5383 Other fatigue: Secondary | ICD-10-CM | POA: Diagnosis not present

## 2021-07-28 LAB — CMP14+EGFR
ALT: 19 IU/L (ref 0–32)
AST: 27 IU/L (ref 0–40)
Albumin/Globulin Ratio: 1.9 (ref 1.2–2.2)
Albumin: 4.4 g/dL (ref 3.6–4.6)
Alkaline Phosphatase: 131 IU/L — ABNORMAL HIGH (ref 44–121)
BUN/Creatinine Ratio: 21 (ref 12–28)
BUN: 18 mg/dL (ref 8–27)
Bilirubin Total: 0.4 mg/dL (ref 0.0–1.2)
CO2: 24 mmol/L (ref 20–29)
Calcium: 9.9 mg/dL (ref 8.7–10.3)
Chloride: 99 mmol/L (ref 96–106)
Creatinine, Ser: 0.87 mg/dL (ref 0.57–1.00)
Globulin, Total: 2.3 g/dL (ref 1.5–4.5)
Glucose: 93 mg/dL (ref 65–99)
Potassium: 4.5 mmol/L (ref 3.5–5.2)
Sodium: 140 mmol/L (ref 134–144)
Total Protein: 6.7 g/dL (ref 6.0–8.5)
eGFR: 65 mL/min/{1.73_m2} (ref >59)

## 2021-07-28 LAB — LIPID PANEL
Chol/HDL Ratio: 3.1 ratio (ref 0.0–4.4)
Cholesterol, Total: 298 mg/dL — ABNORMAL HIGH (ref 100–199)
HDL: 97 mg/dL (ref >39)
LDL Chol Calc (NIH): 185 mg/dL — ABNORMAL HIGH (ref 0–99)
Triglycerides: 99 mg/dL (ref 0–149)
VLDL Cholesterol Cal: 16 mg/dL (ref 5–40)

## 2021-07-28 LAB — CBC WITH DIFFERENTIAL/PLATELET
Basophils Absolute: 0.1 10*3/uL (ref 0.0–0.2)
Basos: 1 %
EOS (ABSOLUTE): 0.2 10*3/uL (ref 0.0–0.4)
Eos: 3 %
Hematocrit: 41.4 % (ref 34.0–46.6)
Hemoglobin: 13.8 g/dL (ref 11.1–15.9)
Immature Grans (Abs): 0 10*3/uL (ref 0.0–0.1)
Immature Granulocytes: 0 %
Lymphocytes Absolute: 1.3 10*3/uL (ref 0.7–3.1)
Lymphs: 21 %
MCH: 30.1 pg (ref 26.6–33.0)
MCHC: 33.3 g/dL (ref 31.5–35.7)
MCV: 90 fL (ref 79–97)
Monocytes Absolute: 0.6 10*3/uL (ref 0.1–0.9)
Monocytes: 10 %
Neutrophils Absolute: 3.8 10*3/uL (ref 1.4–7.0)
Neutrophils: 65 %
Platelets: 253 10*3/uL (ref 150–450)
RBC: 4.58 x10E6/uL (ref 3.77–5.28)
RDW: 13 % (ref 11.7–15.4)
WBC: 5.9 10*3/uL (ref 3.4–10.8)

## 2021-07-28 LAB — VITAMIN D 25 HYDROXY (VIT D DEFICIENCY, FRACTURES): Vit D, 25-Hydroxy: 53.9 ng/mL (ref 30.0–100.0)

## 2021-07-28 LAB — TSH: TSH: 3.18 u[IU]/mL (ref 0.450–4.500)

## 2021-07-29 ENCOUNTER — Other Ambulatory Visit: Payer: Self-pay | Admitting: Family Medicine

## 2021-07-30 ENCOUNTER — Encounter: Payer: Self-pay | Admitting: Family Medicine

## 2021-07-30 NOTE — Procedures (Signed)
[place] A. Merlene Laughter, MD     [email or web address]             HOME SLEEP TEST  LOCATION: [facility]   Patient Name: Kelsey Nelson, Kelsey Nelson Date: 07/22/2021 Gender: Female D.O.B: 1934/11/04 Age (years): 32 Referring Provider: Etta Quill PA Height (inches): 55 Interpreting Physician: Phillips Odor MD, ABSM Weight (lbs): 129 RPSGT: Rosebud Poles BMI: 21 MRN: [id number] Neck Size: CLINICAL INFORMATION Sleep Study Type: HST     Indication for sleep study: N/A     Epworth Sleepiness Score: N/A  SLEEP STUDY TECHNIQUE A multi-channel overnight portable sleep study was performed. The channels recorded were: nasal airflow, thoracic respiratory movement, and oxygen saturation with a pulse oximetry. Snoring was also monitored.  MEDICATIONS Patient self administered medications include: N/A.  Current Outpatient Medications:    B Complex Vitamins (VITAMIN-B COMPLEX PO), Take by mouth daily. With Vitamin B12 included., Disp: , Rfl:    Calcium Carbonate Antacid (TUMS PO), Take by mouth. Per patient she takes as needed for acid reflux , heart burn. Maybe 2 a day., Disp: , Rfl:    Calcium-Magnesium-Vitamin D (CALCIUM 1200+D3 PO), Take by mouth daily., Disp: , Rfl:    Cholecalciferol (VITAMIN D3 PO), Take 25 mcg by mouth., Disp: , Rfl:    conjugated estrogens (PREMARIN) vaginal cream, Apply one fingertip to introitus every 2 days., Disp: 42.5 g, Rfl: 5   docusate sodium (COLACE) 100 MG capsule, Take 2 capsules (200 mg total) by mouth daily., Disp: 10 capsule, Rfl: 0   famotidine (PEPCID) 20 MG tablet, Take 1 tablet (20 mg total) by mouth daily before supper., Disp: , Rfl:    Ginger, Zingiber officinalis, (GINGER ROOT) 550 MG CAPS, Take by mouth daily., Disp: , Rfl:    LUMIGAN 0.01 % SOLN, Place 1 drop into the left eye at bedtime., Disp: , Rfl:    Magnesium 400 MG CAPS, Take by mouth daily., Disp: , Rfl:    mirabegron ER (MYRBETRIQ) 25 MG TB24 tablet, Take 1 tablet  (25 mg total) by mouth daily., Disp: 30 tablet, Rfl: 0   multivitamin-lutein (OCUVITE-LUTEIN) CAPS capsule, Take 1 capsule by mouth daily., Disp: , Rfl:    nitrofurantoin (MACRODANTIN) 50 MG capsule, Take 1 capsule (50 mg total) by mouth at bedtime., Disp: 30 capsule, Rfl: 11   OVER THE COUNTER MEDICATION, Multi Collagen Protein - Patient states that she takes 1 scoop a day., Disp: , Rfl:    Probiotic Product (PROBIOTIC DAILY PO), Take by mouth daily., Disp: , Rfl:    ramipril (ALTACE) 10 MG capsule, Take 1 capsule (10 mg total) by mouth daily., Disp: 90 capsule, Rfl: 3   rosuvastatin (CRESTOR) 5 MG tablet, Take one tablet by mouth every Monday, Wednesday an Friday, Disp: 36 tablet, Rfl: 1   Simethicone (GAS-X PO), Take by mouth. Per Patient she takes a couple (2) a day., Disp: , Rfl:    vitamin C (ASCORBIC ACID) 500 MG tablet, Take 500 mg by mouth daily., Disp: , Rfl:    Zinc 50 MG TABS, Take by mouth daily., Disp: , Rfl:   Current Facility-Administered Medications:    hyoscyamine (LEVSIN SL) SL tablet 0.125 mg, 0.125 mg, Sublingual, Q6H PRN, Ezzard Standing, PA-C   SLEEP ARCHITECTURE Patient was studied for 454.4 minutes. The sleep efficiency was 94.7 % and the patient was supine for 97.9%. The arousal index was 0.0 per hour.  RESPIRATORY PARAMETERS The overall AHI was 15.8 per hour, with a central apnea index of  0 per hour.  The oxygen nadir was 74% during sleep.     CARDIAC DATA Mean heart rate during sleep was 60.3 bpm.  IMPRESSIONS Moderate obstructive sleep apnea occurred during this study (AHI = 15.8/hr.  Auto PAP 8-12 is recommended.  Delano Metz, MD Diplomate, American Board of Sleep Medicine.  ELECTRONICALLY SIGNED ON:  07/30/2021, 9:15 AM [facility] PH: [phone]   FX: [phone] [place]

## 2021-07-30 NOTE — Assessment & Plan Note (Signed)
Controlled, no change in medication DASH diet and commitment to daily physical activity for a minimum of 30 minutes discussed and encouraged, as a part of hypertension management. The importance of attaining a healthy weight is also discussed.  BP/Weight 07/24/2021 07/04/2021 03/13/2021 03/07/2021 01/10/2021 12/20/2020 [id number]  Systolic BP [id number] [id number]  Diastolic BP 72 79 68 77 79 79 74  Wt. (Lbs) 124.4 - 129.7 130.8 124 129 119  BMI 20.08 - 21.25 21.44 20.32 21.14 19.5

## 2021-07-30 NOTE — Assessment & Plan Note (Signed)
Maintained on daily prophylactic antibiotic by Urology

## 2021-07-30 NOTE — Assessment & Plan Note (Signed)
Improved on current meds, managed by Urology

## 2021-07-30 NOTE — Assessment & Plan Note (Signed)
Unchanged and severe Sleep hygiene reviewed and written information offered also. Will see what other option can be tried

## 2021-07-30 NOTE — Progress Notes (Signed)
   IDELL DUQUAINE     MRN: [id number]      DOB: [birth year]   HPI [name] is here for follow up and re-evaluation of chronic medical conditions, medication management and review of any available recent lab and radiology data.  Preventive health is updated, specifically  Cancer screening and Immunization.   Questions or concerns regarding consultations or procedures which the PT has had in the interim are  addressed. The PT denies any adverse reactions to current medications since the last visit.  Chronic severe insomnia , unchanged, has been seeing Neurology for this, filled expensive script which had no benefit   ROS Denies recent fever or chills. Denies sinus pressure, nasal congestion, ear pain or sore throat. Denies chest congestion, productive cough or wheezing. Denies chest pains, palpitations and leg swelling Denies abdominal pain, nausea, vomiting,diarrhea or constipation.   Denies dysuria, frequency, hesitancy or incontinence. Denies joint pain, swelling and limitation in mobility. Denies headaches, seizures, numbness, or tingling. . Denies skin break down or rash.   PE  BP 112/72   Pulse 66   Ht '5\' 6"'$  (1.676 m)   Wt 124 lb 6.4 oz (56.4 kg)   SpO2 99%   BMI 20.08 kg/m   Patient alert and oriented and in no cardiopulmonary distress.  HEENT: No facial asymmetry, EOMI,     Neck supple .  Chest: Clear to auscultation bilaterally.  CVS: S1, S2 no murmurs, no S3.Regular rate.  ABD: Soft non tender.   Ext: No edema  MS: Adequate ROM spine, shoulders, hips and knees.  Skin: Intact, no ulcerations or rash noted.  Psych: Good eye contact, normal affect. Memory intact not anxious or depressed appearing.  CNS: CN 2-12 intact, power,  normal throughout.no focal deficits noted.   Assessment & Plan  HTN (hypertension) Controlled, no change in medication DASH diet and commitment to daily physical activity for a minimum of 30 minutes discussed and encouraged, as a  part of hypertension management. The importance of attaining a healthy weight is also discussed.  BP/Weight 07/24/2021 07/04/2021 03/13/2021 03/07/2021 01/10/2021 12/20/2020 [id number]  Systolic BP [id number] [id number] [id number]  Diastolic BP 72 79 68 77 79 79 74  Wt. (Lbs) 124.4 - 129.7 130.8 124 129 119  BMI 20.08 - 21.25 21.44 20.32 21.14 19.5       Insomnia Unchanged and severe Sleep hygiene reviewed and written information offered also. Will see what other option can be tried  Urinary urgency Improved on current meds, managed by Urology  Recurrent UTI Maintained on daily prophylactic antibiotic by Urology  Mixed hyperlipidemia Hyperlipidemia:Low fat diet discussed and encouraged.   Lipid Panel  Lab Results  Component Value Date   CHOL 298 (H) 07/27/2021   HDL 97 07/27/2021   LDLCALC 185 (H) 07/27/2021   TRIG 99 07/27/2021   CHOLHDL 3.1 07/27/2021     Un Controlled, start low dose crestor and reduce fat intake

## 2021-07-30 NOTE — Assessment & Plan Note (Signed)
Hyperlipidemia:Low fat diet discussed and encouraged.   Lipid Panel  Lab Results  Component Value Date   CHOL 298 (H) 07/27/2021   HDL 97 07/27/2021   LDLCALC 185 (H) 07/27/2021   TRIG 99 07/27/2021   CHOLHDL 3.1 07/27/2021     Un Controlled, start low dose crestor and reduce fat intake

## 2021-08-13 DIAGNOSIS — G4733 Obstructive sleep apnea (adult) (pediatric): Secondary | ICD-10-CM | POA: Diagnosis not present

## 2021-08-13 DIAGNOSIS — H269 Unspecified cataract: Secondary | ICD-10-CM | POA: Diagnosis not present

## 2021-08-13 DIAGNOSIS — F5104 Psychophysiologic insomnia: Secondary | ICD-10-CM | POA: Diagnosis not present

## 2021-08-13 DIAGNOSIS — I10 Essential (primary) hypertension: Secondary | ICD-10-CM | POA: Diagnosis not present

## 2021-08-17 ENCOUNTER — Other Ambulatory Visit: Payer: Self-pay

## 2021-08-17 ENCOUNTER — Encounter: Payer: Self-pay | Admitting: Urology

## 2021-08-17 ENCOUNTER — Other Ambulatory Visit: Payer: PPO

## 2021-08-17 ENCOUNTER — Telehealth: Payer: Self-pay

## 2021-08-17 ENCOUNTER — Telehealth (INDEPENDENT_AMBULATORY_CARE_PROVIDER_SITE_OTHER): Payer: PPO | Admitting: Urology

## 2021-08-17 DIAGNOSIS — N3001 Acute cystitis with hematuria: Secondary | ICD-10-CM | POA: Diagnosis not present

## 2021-08-17 DIAGNOSIS — R351 Nocturia: Secondary | ICD-10-CM

## 2021-08-17 DIAGNOSIS — R300 Dysuria: Secondary | ICD-10-CM | POA: Diagnosis not present

## 2021-08-17 DIAGNOSIS — N3281 Overactive bladder: Secondary | ICD-10-CM | POA: Diagnosis not present

## 2021-08-17 LAB — URINALYSIS, ROUTINE W REFLEX MICROSCOPIC
Bilirubin, UA: NEGATIVE
Glucose, UA: NEGATIVE
Ketones, UA: NEGATIVE
Nitrite, UA: POSITIVE — AB
Protein,UA: NEGATIVE
Specific Gravity, UA: <1.005 — ABNORMAL LOW (ref 1.005–1.030)
Urobilinogen, Ur: 0.2 mg/dL (ref 0.2–1.0)
pH, UA: 5.5 (ref 5.0–7.5)

## 2021-08-17 LAB — MICROSCOPIC EXAMINATION
Epithelial Cells (non renal): >10 /hpf — AB (ref 0–10)
Renal Epithel, UA: NONE SEEN /hpf

## 2021-08-17 MED ORDER — NITROFURANTOIN MONOHYD MACRO 100 MG PO CAPS: 100.0000 mg | ORAL_CAPSULE | Freq: Two times a day (BID) | ORAL | 0 refills | Status: DC

## 2021-08-17 NOTE — Patient Instructions (Signed)

## 2021-08-17 NOTE — Telephone Encounter (Signed)
Patient called and made aware of ua results and antibiotic sent to pharmacy.

## 2021-08-17 NOTE — Progress Notes (Signed)
08/17/2021 10:59 AM   Kelsey Nelson 1934-02-25 [id number]  Referring provider: Fayrene Helper, MD [address] [place],  [place] [address]  Patient location: home Physician location: office I connected with  Kelsey Nelson on 08/17/21 by a video enabled telemedicine application and verified that I am speaking with the correct person using two identifiers.   I discussed the limitations of evaluation and management by telemedicine. The patient expressed understanding and agreed to proceed.    Followup OAB   HPI: [name] is a 87yo here for followup for OAB, nocturia and acute cystitis. She was taking mirabegron '25mg'$  which improved her nocturia to 2-3, urinary frequency decreased to every 1-2 hours. She has less bother with her OAb symptoms. She wishes to increase the dose of the mirabegron. She denies any recent UTI. No other complaints today. She wishes to drop a urine speciment ot have it checked for UTI.    PMH: Past Medical History:  Diagnosis Date   Acute cystitis with hematuria 10/26/2013   Allergic rhinitis 11/08/2017   Blindness of right eye    BMI between 19-24,adult 2010 145 LBS   Diverticula of small intestine Dec 2010   Diverticulosis    [place]   DIVERTICULOSIS, SIGMOID COLON 03/31/2008   Qualifier: Diagnosis of  By: Dierdre Harness  Noted on abdominal and pelvic scan in 04/2011.  Extensive colonic diverticulosis , and possible muscular hypertrophy at rectosigmoid junction    Family hx of colon cancer 11/09/2013   Functional constipation 2010   GERD (gastroesophageal reflux disease)    HTN (hypertension)    Hyperlipemia    Pancreatic cyst 02/05/2013   Renal cyst 05/27/2011   Right eye trauma 1949   Loss of vision resulted at age 25   Schatzki's ring Dec 2010   Skin lesions, generalized 06/19/2015   Tubular adenoma 11/09/2013    Surgical History: Past Surgical History:  Procedure Laterality Date   ABDOMINAL HYSTERECTOMY     BREAST REDUCTION SURGERY   1992 BIL   COLONOSCOPY  DEC 2010   SIMPLE ADENOMAS (6-8), [place] TICS, SML IH   COLONOSCOPY N/A 12/08/2013   Procedure: COLONOSCOPY;  Surgeon: Rogene Houston, MD;  [facility];  Service: Endoscopy;  Laterality: N/A;  225   EVISCERATION Right 05/08/2015   Procedure: EVISCERATION WITH IMPLANT RIGHT EYE;  Surgeon: Williams Che, MD;  Location: [facility];  Service: Ophthalmology;  Laterality: Right;   EYE SURGERY  RIGHT 2005   EYE SURGERY  right 2016   removed and false eye placed in 08/2015   TOTAL ABDOMINAL HYSTERECTOMY W/ BILATERAL SALPINGOOPHORECTOMY  1985 FIBROIDS   UPPER GASTROINTESTINAL ENDOSCOPY  DEC 2010   Bx-MILD GASTRITIS/DUODENITIS, DUODENAL DIVERTICULA, SCHATZKI'S RING    Home Medications:  Allergies as of 08/17/2021       Reactions   Codeine    Fosamax [alendronate Sodium]    heartburn        Medication List        Accurate as of August 17, 2021 10:59 AM. If you have any questions, ask your nurse or doctor.          CALCIUM 1200+D3 PO Take by mouth daily.   conjugated estrogens vaginal cream Commonly known as: PREMARIN Apply one fingertip to introitus every 2 days.   Crestor 5 MG tablet Generic drug: rosuvastatin Take one tablet by mouth every Monday, Wednesday an Friday   docusate sodium 100 MG capsule Commonly known as: Colace Take  2 capsules (200 mg total) by mouth daily.   famotidine 20 MG tablet Commonly known as: Pepcid Take 1 tablet (20 mg total) by mouth daily before supper.   GAS-X PO Take by mouth. Per Patient she takes a couple (2) a day.   Ginger Root 550 MG Caps Take by mouth daily.   Lumigan 0.01 % Soln Generic drug: bimatoprost Place 1 drop into the left eye at bedtime.   Magnesium 400 MG Caps Take by mouth daily.   mirabegron ER 25 MG Tb24 tablet Commonly known as: MYRBETRIQ Take 1 tablet (25 mg total) by mouth daily.   multivitamin-lutein Caps capsule Take 1 capsule by mouth daily.   nitrofurantoin 50 MG  capsule Commonly known as: MACRODANTIN Take 1 capsule (50 mg total) by mouth at bedtime.   OVER THE COUNTER MEDICATION Multi Collagen Protein - Patient states that she takes 1 scoop a day.   PROBIOTIC DAILY PO Take by mouth daily.   ramipril 10 MG capsule Commonly known as: ALTACE Take 1 capsule (10 mg total) by mouth daily.   TUMS PO Take by mouth. Per patient she takes as needed for acid reflux , heart burn. Maybe 2 a day.   vitamin C 500 MG tablet Commonly known as: ASCORBIC ACID Take 500 mg by mouth daily.   VITAMIN D3 PO Take 25 mcg by mouth.   VITAMIN-B COMPLEX PO Take by mouth daily. With Vitamin B12 included.   Zinc 50 MG Tabs Take by mouth daily.        Allergies:  Allergies  Allergen Reactions   Codeine    Fosamax [Alendronate Sodium]     heartburn    Family History: Family History  Problem Relation Age of Onset   Pancreatic cancer Sister    Colon cancer Paternal Aunt    Colon polyps Neg Hx     Social History:  reports that she has never smoked. She has never used smokeless tobacco. She reports that she does not drink alcohol and does not use drugs.  ROS: All other review of systems were reviewed and are negative except what is noted above in HPI   Laboratory Data: Lab Results  Component Value Date   WBC 5.9 07/27/2021   HGB 13.8 07/27/2021   HCT 41.4 07/27/2021   MCV 90 07/27/2021   PLT 253 07/27/2021    Lab Results  Component Value Date   CREATININE 0.87 07/27/2021    No results found for: PSA  No results found for: TESTOSTERONE  Lab Results  Component Value Date   HGBA1C 5.6 09/03/2016    Urinalysis    Component Value Date/Time   COLORURINE STRAW (A) 12/04/2018 1058   APPEARANCEUR Cloudy (A) 10/05/2020 1128   LABSPEC 1.003 (L) 12/04/2018 1058   PHURINE 7.0 12/04/2018 1058   GLUCOSEU Negative 10/05/2020 1128   HGBUR SMALL (A) 12/04/2018 1058   BILIRUBINUR negative 05/10/2021 0823   BILIRUBINUR negative 10/19/2020  1137   BILIRUBINUR Negative 10/05/2020 1128   KETONESUR negative 05/10/2021 0823   KETONESUR NEGATIVE 12/04/2018 1058   PROTEINUR Negative 10/19/2020 1137   PROTEINUR Negative 10/05/2020 1128   PROTEINUR NEGATIVE 12/04/2018 1058   UROBILINOGEN 0.2 05/10/2021 0823   UROBILINOGEN 0.2 07/14/2009 2018   NITRITE Negative 05/10/2021 0823   NITRITE negative 10/19/2020 1137   NITRITE Positive (A) 10/05/2020 1128   NITRITE NEGATIVE 12/04/2018 1058   LEUKOCYTESUR Small (1+) (A) 05/10/2021 0823   LEUKOCYTESUR Trace (A) 10/05/2020 1128    Lab Results  Component Value Date   LABMICR See below: 10/05/2020   WBCUA 6-10 (A) 10/05/2020   LABEPIT 0-10 10/05/2020   BACTERIA Many (A) 10/05/2020    Pertinent Imaging: none Results for orders placed during the hospital encounter of 07/31/12  DG Abd 1 View  Narrative *RADIOLOGY REPORT*  Clinical Data: History of constipation.  Abdominal pain.  ABDOMEN - 1 VIEW  Comparison: 06/21/2011  Findings: Moderate stool burden within the colon, slightly decreased in the left colon since prior study.  No evidence of bowel obstruction.  No free air.  No organomegaly or acute bony abnormality.  Calcified phleboliths in the anatomic pelvis.  IMPRESSION: Moderate stool burden, decreased from prior study.  No acute findings.  Original Report Authenticated By: Raelyn Number, M.D.  No results found for this or any previous visit.  No results found for this or any previous visit.  No results found for this or any previous visit.  No results found for this or any previous visit.  No results found for this or any previous visit.  No results found for this or any previous visit.  No results found for this or any previous visit.   Assessment & Plan:    1. Acute cystitis with hematuria -patient to drop off urine for culture  2. OAB (overactive bladder) -increase mirabegron to '50mg'$  daily  3. Nocturia -mirabegron '50mg'$  daily   No  follow-ups on file.  Nicolette Bang, MD  [facility] [place]

## 2021-08-20 ENCOUNTER — Other Ambulatory Visit: Payer: Self-pay | Admitting: Family Medicine

## 2021-08-20 LAB — URINE CULTURE

## 2021-08-20 MED ORDER — SULFAMETHOXAZOLE-TRIMETHOPRIM 800-160 MG PO TABS: 1.0000 | ORAL_TABLET | Freq: Two times a day (BID) | ORAL | 0 refills | Status: AC

## 2021-08-28 ENCOUNTER — Telehealth: Payer: Self-pay

## 2021-08-28 NOTE — Telephone Encounter (Signed)
error 

## 2021-08-29 ENCOUNTER — Telehealth: Payer: Self-pay

## 2021-08-29 NOTE — Telephone Encounter (Signed)
Spoke with patient. Patient feels like her bladder may have "fallen" some. Patient scheduled to see MD for next available.

## 2021-08-29 NOTE — Telephone Encounter (Signed)
This pt needed a refill on her medication.  Dr. Alyson Ingles told her to make an appt with him when she ran out. McKenzie's schedule is full.  She says she is also having bladder trouble and wants to speak to a nurse.  Please advise.  Call back:  [phone]  Thanks, Helene Kelp

## 2021-10-15 DIAGNOSIS — H40012 Open angle with borderline findings, low risk, left eye: Secondary | ICD-10-CM | POA: Diagnosis not present

## 2021-10-15 DIAGNOSIS — I10 Essential (primary) hypertension: Secondary | ICD-10-CM | POA: Diagnosis not present

## 2021-10-15 DIAGNOSIS — Z961 Presence of intraocular lens: Secondary | ICD-10-CM | POA: Diagnosis not present

## 2021-10-19 ENCOUNTER — Encounter: Payer: Self-pay | Admitting: Urology

## 2021-10-19 ENCOUNTER — Ambulatory Visit: Payer: PPO | Admitting: Urology

## 2021-10-19 ENCOUNTER — Other Ambulatory Visit: Payer: Self-pay

## 2021-10-19 VITALS — BP 155/79 | HR 76 | Temp 98.1°F | Wt 127.4 lb

## 2021-10-19 DIAGNOSIS — R351 Nocturia: Secondary | ICD-10-CM | POA: Diagnosis not present

## 2021-10-19 DIAGNOSIS — N3281 Overactive bladder: Secondary | ICD-10-CM

## 2021-10-19 LAB — URINALYSIS, ROUTINE W REFLEX MICROSCOPIC
Bilirubin, UA: NEGATIVE
Glucose, UA: NEGATIVE
Ketones, UA: NEGATIVE
Nitrite, UA: POSITIVE — AB
Protein,UA: NEGATIVE
Specific Gravity, UA: 1.02 (ref 1.005–1.030)
Urobilinogen, Ur: 0.2 mg/dL (ref 0.2–1.0)
pH, UA: 7 (ref 5.0–7.5)

## 2021-10-19 NOTE — Progress Notes (Signed)
10/19/2021 10:21 AM   Kelsey Nelson 21-Jul-1934 [id number]  Referring provider: Fayrene Helper, MD [address], [place],  [place] [address]  Followup OAB and recurrent UTI   HPI: Kelsey Nelson is a 87yo here for followup for OAB and recurrent UTI. No UTIs since last visit. She denies any daytime frequency. She has nocturia 8x which are large volumes. She was diagnosed with OSA and was prescribed a CPAP which she is not using. Urine stream strong.   PMH: Past Medical History:  Diagnosis Date   Acute cystitis with hematuria 10/26/2013   Allergic rhinitis 11/08/2017   Blindness of right eye    BMI between 19-24,adult 2010 145 LBS   Diverticula of small intestine Dec 2010   Diverticulosis    [place]   DIVERTICULOSIS, SIGMOID COLON 03/31/2008   Qualifier: Diagnosis of  By: Dierdre Harness  Noted on abdominal and pelvic scan in 04/2011.  Extensive colonic diverticulosis , and possible muscular hypertrophy at rectosigmoid junction    Family hx of colon cancer 11/09/2013   Functional constipation 2010   GERD (gastroesophageal reflux disease)    HTN (hypertension)    Hyperlipemia    Pancreatic cyst 02/05/2013   Renal cyst 05/27/2011   Right eye trauma 1949   Loss of vision resulted at age 77   Schatzki's ring Dec 2010   Skin lesions, generalized 06/19/2015   Tubular adenoma 11/09/2013    Surgical History: Past Surgical History:  Procedure Laterality Date   ABDOMINAL HYSTERECTOMY     BREAST REDUCTION SURGERY  1992 BIL   COLONOSCOPY  DEC 2010   SIMPLE ADENOMAS (6-8), [place] TICS, SML IH   COLONOSCOPY N/A 12/08/2013   Procedure: COLONOSCOPY;  Surgeon: Rogene Houston, MD;  [facility];  Service: Endoscopy;  Laterality: N/A;  225   EVISCERATION Right 05/08/2015   Procedure: EVISCERATION WITH IMPLANT RIGHT EYE;  Surgeon: Williams Che, MD;  Location: [facility];  Service: Ophthalmology;  Laterality: Right;   EYE SURGERY  RIGHT 2005   EYE SURGERY  right 2016   removed and  false eye placed in 08/2015   TOTAL ABDOMINAL HYSTERECTOMY W/ BILATERAL SALPINGOOPHORECTOMY  1985 FIBROIDS   UPPER GASTROINTESTINAL ENDOSCOPY  DEC 2010   Bx-MILD GASTRITIS/DUODENITIS, DUODENAL DIVERTICULA, SCHATZKI'S RING    Home Medications:  Allergies as of 10/19/2021       Reactions   Codeine    Fosamax [alendronate Sodium]    heartburn        Medication List        Accurate as of October 19, 2021 10:21 AM. If you have any questions, ask your nurse or doctor.          CALCIUM 1200+D3 PO Take by mouth daily.   conjugated estrogens 0.625 MG/GM vaginal cream Commonly known as: PREMARIN Apply one fingertip to introitus every 2 days.   Crestor 5 MG tablet Generic drug: rosuvastatin Take one tablet by mouth every Monday, Wednesday an Friday   docusate sodium 100 MG capsule Commonly known as: Colace Take 2 capsules (200 mg total) by mouth daily.   famotidine 20 MG tablet Commonly known as: Pepcid Take 1 tablet (20 mg total) by mouth daily before supper.   GAS-X PO Take by mouth. Per Patient she takes a couple (2) a day.   Ginger Root 550 MG Caps Take by mouth daily.   Lumigan 0.01 % Soln Generic drug: bimatoprost Place 1 drop into the left eye at bedtime.  Magnesium 400 MG Caps Take by mouth daily.   mirabegron ER 25 MG Tb24 tablet Commonly known as: MYRBETRIQ Take 1 tablet (25 mg total) by mouth daily.   multivitamin-lutein Caps capsule Take 1 capsule by mouth daily.   nitrofurantoin (macrocrystal-monohydrate) 100 MG capsule Commonly known as: MACROBID Take 1 capsule (100 mg total) by mouth 2 (two) times daily.   nitrofurantoin 50 MG capsule Commonly known as: MACRODANTIN Take 1 capsule (50 mg total) by mouth at bedtime.   OVER THE COUNTER MEDICATION Multi Collagen Protein - Patient states that she takes 1 scoop a day.   PROBIOTIC DAILY PO Take by mouth daily.   ramipril 10 MG capsule Commonly known as: ALTACE Take 1 capsule (10 mg  total) by mouth daily.   TUMS PO Take by mouth. Per patient she takes as needed for acid reflux , heart burn. Maybe 2 a day.   vitamin C 500 MG tablet Commonly known as: ASCORBIC ACID Take 500 mg by mouth daily.   VITAMIN D3 PO Take 25 mcg by mouth.   VITAMIN-B COMPLEX PO Take by mouth daily. With Vitamin B12 included.   Zinc 50 MG Tabs Take by mouth daily.        Allergies:  Allergies  Allergen Reactions   Codeine    Fosamax [Alendronate Sodium]     heartburn    Family History: Family History  Problem Relation Age of Onset   Pancreatic cancer Sister    Colon cancer Paternal Aunt    Colon polyps Neg Hx     Social History:  reports that she has never smoked. She has never used smokeless tobacco. She reports that she does not drink alcohol and does not use drugs.  ROS: All other review of systems were reviewed and are negative except what is noted above in HPI  Physical Exam: BP (!) 155/79   Pulse 76   Temp 98.1 F (36.7 C)   Wt 127 lb 6.4 oz (57.8 kg)   BMI 20.56 kg/m   Constitutional:  Alert and oriented, No acute distress. HEENT: [place] AT, moist mucus membranes.  Trachea midline, no masses. Cardiovascular: No clubbing, cyanosis, or edema. Respiratory: Normal respiratory effort, no increased work of breathing. GI: Abdomen is soft, nontender, nondistended, no abdominal masses GU: No CVA tenderness.  Lymph: No cervical or inguinal lymphadenopathy. Skin: No rashes, bruises or suspicious lesions. Neurologic: Grossly intact, no focal deficits, moving all 4 extremities. Psychiatric: Normal mood and affect.  Laboratory Data: Lab Results  Component Value Date   WBC 5.9 07/27/2021   HGB 13.8 07/27/2021   HCT 41.4 07/27/2021   MCV 90 07/27/2021   PLT 253 07/27/2021    Lab Results  Component Value Date   CREATININE 0.87 07/27/2021    No results found for: PSA  No results found for: TESTOSTERONE  Lab Results  Component Value Date   HGBA1C 5.6  09/03/2016    Urinalysis    Component Value Date/Time   COLORURINE STRAW (A) 12/04/2018 1058   APPEARANCEUR Clear 08/17/2021 1455   LABSPEC 1.003 (L) 12/04/2018 1058   PHURINE 7.0 12/04/2018 1058   GLUCOSEU Negative 08/17/2021 1455   HGBUR SMALL (A) 12/04/2018 1058   BILIRUBINUR Negative 08/17/2021 1455   KETONESUR negative 05/10/2021 0823   KETONESUR NEGATIVE 12/04/2018 1058   PROTEINUR Negative 08/17/2021 1455   PROTEINUR NEGATIVE 12/04/2018 1058   UROBILINOGEN 0.2 05/10/2021 0823   UROBILINOGEN 0.2 07/14/2009 2018   NITRITE Positive (A) 08/17/2021 1455   NITRITE  NEGATIVE 12/04/2018 1058   LEUKOCYTESUR 1+ (A) 08/17/2021 1455    Lab Results  Component Value Date   LABMICR See below: 08/17/2021   WBCUA 6-10 (A) 08/17/2021   LABEPIT >10 (A) 08/17/2021   BACTERIA Many (A) 08/17/2021    Pertinent Imaging:  Results for orders placed during the hospital encounter of 07/31/12  DG Abd 1 View  Narrative *RADIOLOGY REPORT*  Clinical Data: History of constipation.  Abdominal pain.  ABDOMEN - 1 VIEW  Comparison: 06/21/2011  Findings: Moderate stool burden within the colon, slightly decreased in the left colon since prior study.  No evidence of bowel obstruction.  No free air.  No organomegaly or acute bony abnormality.  Calcified phleboliths in the anatomic pelvis.  IMPRESSION: Moderate stool burden, decreased from prior study.  No acute findings.  Original Report Authenticated By: Raelyn Number, M.D.  No results found for this or any previous visit.  No results found for this or any previous visit.  No results found for this or any previous visit.  No results found for this or any previous visit.  No results found for this or any previous visit.  No results found for this or any previous visit.  No results found for this or any previous visit.   Assessment & Plan:    1. OAB (overactive bladder) -Patient instructed to use her CPAP - Urinalysis,  Routine w reflex microscopic - Urine culture  2. Nocturia -Patient instructed to use her CPAP - Urinalysis, Routine w reflex microscopic - Urine culture   No follow-ups on file.  Nicolette Bang, MD  [facility] [place]

## 2021-10-19 NOTE — Patient Instructions (Signed)

## 2021-10-19 NOTE — Progress Notes (Signed)
Urological Symptom Review  Patient is experiencing the following symptoms: Frequent urination Get up at night to urinate Urinary tract infection   Review of Systems  Gastrointestinal (upper)  : Negative for upper GI symptoms  Gastrointestinal (lower) : Negative for lower GI symptoms  Constitutional : Negative for symptoms  Skin: Negative for skin symptoms  Eyes: Negative for eye symptoms  Ear/Nose/Throat : Negative for Ear/Nose/Throat symptoms  Hematologic/Lymphatic: Negative for Hematologic/Lymphatic symptoms  Cardiovascular : Negative for cardiovascular symptoms  Respiratory : Negative for respiratory symptoms  Endocrine: Negative for endocrine symptoms  Musculoskeletal: Negative for musculoskeletal symptoms  Neurological: Negative for neurological symptoms  Psychologic: Negative for psychiatric symptoms

## 2021-10-23 ENCOUNTER — Telehealth: Payer: Self-pay

## 2021-10-23 DIAGNOSIS — N3001 Acute cystitis with hematuria: Secondary | ICD-10-CM

## 2021-10-23 LAB — URINE CULTURE

## 2021-10-23 MED ORDER — NITROFURANTOIN MONOHYD MACRO 100 MG PO CAPS
100.0000 mg | ORAL_CAPSULE | Freq: Two times a day (BID) | ORAL | 0 refills | Status: DC
Start: 2021-10-23 — End: 2021-12-26

## 2021-10-23 NOTE — Telephone Encounter (Signed)
Patient called and made aware of new abt sent to pharmacy.  Patient has not taken maintenance macrodantin in over a week. Patient will start back on macrodantin once 7 day Macrobid is completed.  Patient voiced understanding.

## 2021-10-23 NOTE — Telephone Encounter (Signed)
Patient called with no answer. Message left to return call to office. Rx sent.

## 2021-10-26 ENCOUNTER — Encounter: Payer: Self-pay | Admitting: Family Medicine

## 2021-10-26 ENCOUNTER — Ambulatory Visit (INDEPENDENT_AMBULATORY_CARE_PROVIDER_SITE_OTHER): Payer: PPO | Admitting: Family Medicine

## 2021-10-26 ENCOUNTER — Encounter: Payer: PPO | Admitting: Family Medicine

## 2021-10-26 ENCOUNTER — Other Ambulatory Visit: Payer: Self-pay

## 2021-10-26 VITALS — BP 138/80 | HR 75 | Resp 16 | Ht 66.0 in | Wt 127.0 lb

## 2021-10-26 DIAGNOSIS — I10 Essential (primary) hypertension: Secondary | ICD-10-CM

## 2021-10-26 DIAGNOSIS — E782 Mixed hyperlipidemia: Secondary | ICD-10-CM | POA: Diagnosis not present

## 2021-10-26 DIAGNOSIS — H01005 Unspecified blepharitis left lower eyelid: Secondary | ICD-10-CM | POA: Diagnosis not present

## 2021-10-26 DIAGNOSIS — N390 Urinary tract infection, site not specified: Secondary | ICD-10-CM

## 2021-10-26 DIAGNOSIS — R432 Parageusia: Secondary | ICD-10-CM | POA: Diagnosis not present

## 2021-10-26 MED ORDER — PREDNISONE 5 MG PO TABS: 5.0000 mg | ORAL_TABLET | Freq: Two times a day (BID) | ORAL | 0 refills | Status: AC

## 2021-10-26 NOTE — Patient Instructions (Addendum)
Annual exam in January   Fasting lipid, cmp and EGFr 2nd week I December  Short course of prednisone is for taste  Please try and get appointment with Dr Jorja Loa for pt, he is he Primary eye specialist     Fluorometholone will be resasearched, I recommend using once daily for the next week  Thanks for choosing [facility], we consider it a privelige to serve you.

## 2021-10-28 ENCOUNTER — Encounter: Payer: Self-pay | Admitting: Family Medicine

## 2021-10-28 ENCOUNTER — Telehealth: Payer: Self-pay | Admitting: Family Medicine

## 2021-10-28 DIAGNOSIS — H01005 Unspecified blepharitis left lower eyelid: Secondary | ICD-10-CM | POA: Insufficient documentation

## 2021-10-28 NOTE — Assessment & Plan Note (Signed)
Hyperlipidemia:Low fat diet discussed and encouraged.   Lipid Panel  Lab Results  Component Value Date   CHOL 298 (H) 07/27/2021   HDL 97 07/27/2021   LDLCALC 185 (H) 07/27/2021   TRIG 99 07/27/2021   CHOLHDL 3.1 07/27/2021   Updated lab needed at/ before next visit. Needs to reduce fried and fatty foods

## 2021-10-28 NOTE — Assessment & Plan Note (Signed)
Needs re eval by ophthalmology due to persistent symptoms

## 2021-10-28 NOTE — Assessment & Plan Note (Signed)
Controlled, no change in medication DASH diet and commitment to daily physical activity for a minimum of 30 minutes discussed and encouraged, as a part of hypertension management. The importance of attaining a healthy weight is also discussed.  BP/Weight 10/26/2021 10/19/2021 07/24/2021 07/04/2021 03/13/2021 03/07/2021 4/69/6295  Systolic BP 284 132 440 102 725 366 440  Diastolic BP 80 79 72 79 68 77 79  Wt. (Lbs) 127 127.4 124.4 - 129.7 130.8 124  BMI 20.5 20.56 20.08 - 21.25 21.44 20.32

## 2021-10-28 NOTE — Assessment & Plan Note (Signed)
Currently being treated for UTI, has upcoming appt withUrology

## 2021-10-28 NOTE — Assessment & Plan Note (Signed)
Short course of prednisone prescribed 

## 2021-10-28 NOTE — Progress Notes (Signed)
   Kelsey Nelson     MRN: [id number]      DOB: May 11, 1934   HPI Kelsey Nelson is here for follow up and re-evaluation of chronic medical conditions, medication management and review of any available recent lab and radiology data.  Preventive health is updated, specifically  Cancer screening and Immunization.   C/o chronic dry eye symptom despite recent topical steroid use, needs eyee eval C/o loss of taste The PT denies any adverse reactions to current medications since the last visit.     ROS Denies recent fever or chills. Denies sinus pressure, nasal congestion, ear pain or sore throat. Denies chest congestion, productive cough or wheezing. Denies chest pains, palpitations and leg swelling Denies abdominal pain, nausea, vomiting,diarrhea or constipation.   Denies dysuria, frequency, hesitancy or incontinence. Denies joint pain, swelling and limitation in mobility. Denies headaches, seizures, numbness, or tingling. Denies depression, anxiety or insomnia. Denies skin break down or rash.   PE  BP 138/80   Pulse 75   Resp 16   Ht 5\' 6"  (1.676 m)   Wt 127 lb (57.6 kg)   SpO2 95%   BMI 20.50 kg/m   Patient alert and oriented and in no cardiopulmonary distress.  HEENT: No facial asymmetry, EOMI,     Neck supple .Left conjunctival erythema  Chest: Clear to auscultation bilaterally.  CVS: S1, S2 no murmurs, no S3.Regular rate.  ABD: Soft non tender.   Ext: No edema  MS: Adequate ROM spine, shoulders, hips and knees.  Skin: Intact, no ulcerations or rash noted.  Psych: Good eye contact, normal affect. Memory intact not anxious or depressed appearing.  CNS: CN 2-12 intact, power,  normal throughout.no focal deficits noted.   Assessment & Plan  HTN (hypertension) Controlled, no change in medication DASH diet and commitment to daily physical activity for a minimum of 30 minutes discussed and encouraged, as a part of hypertension management. The importance of attaining a  healthy weight is also discussed.  BP/Weight 10/26/2021 10/19/2021 07/24/2021 07/04/2021 03/13/2021 03/07/2021 9/37/1696  Systolic BP 789 381 017 510 258 527 782  Diastolic BP 80 79 72 79 68 77 79  Wt. (Lbs) 127 127.4 124.4 - 129.7 130.8 124  BMI 20.5 20.56 20.08 - 21.25 21.44 20.32       Mixed hyperlipidemia Hyperlipidemia:Low fat diet discussed and encouraged.   Lipid Panel  Lab Results  Component Value Date   CHOL 298 (H) 07/27/2021   HDL 97 07/27/2021   LDLCALC 185 (H) 07/27/2021   TRIG 99 07/27/2021   CHOLHDL 3.1 07/27/2021   Updated lab needed at/ before next visit. Needs to reduce fried and fatty foods   Recurrent UTI Currently being treated for UTI, has upcoming appt withUrology  Loss of taste Short course of prednisone prescribed  Blepharitis of left lower eyelid Needs re eval by ophthalmology due to persistent symptoms

## 2021-10-29 ENCOUNTER — Telehealth: Payer: Self-pay

## 2021-10-29 NOTE — Telephone Encounter (Signed)
Pt has appt with Dr Katy Fitch 10/30/21 and will follow up with Dr Talbert Forest office on 11/12/21 9:15am [facility])  - Pt is aware of both appointments.

## 2021-10-30 DIAGNOSIS — H16222 Keratoconjunctivitis sicca, not specified as Sjogren's, left eye: Secondary | ICD-10-CM | POA: Diagnosis not present

## 2021-10-30 DIAGNOSIS — Z961 Presence of intraocular lens: Secondary | ICD-10-CM | POA: Diagnosis not present

## 2021-10-30 DIAGNOSIS — H0102A Squamous blepharitis right eye, upper and lower eyelids: Secondary | ICD-10-CM | POA: Diagnosis not present

## 2021-10-30 DIAGNOSIS — H0102B Squamous blepharitis left eye, upper and lower eyelids: Secondary | ICD-10-CM | POA: Diagnosis not present

## 2021-10-30 DIAGNOSIS — H40052 Ocular hypertension, left eye: Secondary | ICD-10-CM | POA: Diagnosis not present

## 2021-11-01 ENCOUNTER — Other Ambulatory Visit: Payer: Self-pay

## 2021-11-01 ENCOUNTER — Telehealth: Payer: Self-pay

## 2021-11-01 MED ORDER — NITROFURANTOIN MACROCRYSTAL 50 MG PO CAPS: 50.0000 mg | ORAL_CAPSULE | Freq: Every day | ORAL | 11 refills | Status: DC

## 2021-11-01 NOTE — Telephone Encounter (Signed)
Patient called advising Dr. Alyson Ingles prescribed a medication at last office visit 10/19/2021. She advised she instructed to take medication and once completed she was to pick up a  different medication from [facility]. She advised she wanted clarification since she competed medication and went to [facility] to pick [facility] second medication and there was not one sent.

## 2021-11-01 NOTE — Telephone Encounter (Signed)
Spoke with patient and medication sent to pharmacy.

## 2021-11-12 DIAGNOSIS — I10 Essential (primary) hypertension: Secondary | ICD-10-CM | POA: Diagnosis not present

## 2021-11-12 DIAGNOSIS — H40012 Open angle with borderline findings, low risk, left eye: Secondary | ICD-10-CM | POA: Diagnosis not present

## 2021-11-12 DIAGNOSIS — Z961 Presence of intraocular lens: Secondary | ICD-10-CM | POA: Diagnosis not present

## 2021-11-13 DIAGNOSIS — R351 Nocturia: Secondary | ICD-10-CM | POA: Diagnosis not present

## 2021-11-13 DIAGNOSIS — H269 Unspecified cataract: Secondary | ICD-10-CM | POA: Diagnosis not present

## 2021-11-13 DIAGNOSIS — I10 Essential (primary) hypertension: Secondary | ICD-10-CM | POA: Diagnosis not present

## 2021-11-13 DIAGNOSIS — G4733 Obstructive sleep apnea (adult) (pediatric): Secondary | ICD-10-CM | POA: Diagnosis not present

## 2021-11-13 DIAGNOSIS — F5104 Psychophysiologic insomnia: Secondary | ICD-10-CM | POA: Diagnosis not present

## 2021-11-19 ENCOUNTER — Other Ambulatory Visit: Payer: Self-pay | Admitting: Family Medicine

## 2021-11-21 NOTE — Telephone Encounter (Signed)
No messahge/ note

## 2021-12-14 ENCOUNTER — Ambulatory Visit: Payer: PPO | Admitting: Urology

## 2021-12-25 DIAGNOSIS — H0102A Squamous blepharitis right eye, upper and lower eyelids: Secondary | ICD-10-CM | POA: Diagnosis not present

## 2021-12-25 DIAGNOSIS — H0102B Squamous blepharitis left eye, upper and lower eyelids: Secondary | ICD-10-CM | POA: Diagnosis not present

## 2021-12-25 DIAGNOSIS — Z961 Presence of intraocular lens: Secondary | ICD-10-CM | POA: Diagnosis not present

## 2021-12-25 DIAGNOSIS — H40052 Ocular hypertension, left eye: Secondary | ICD-10-CM | POA: Diagnosis not present

## 2021-12-25 DIAGNOSIS — H16222 Keratoconjunctivitis sicca, not specified as Sjogren's, left eye: Secondary | ICD-10-CM | POA: Diagnosis not present

## 2021-12-26 ENCOUNTER — Ambulatory Visit: Payer: PPO | Admitting: Physician Assistant

## 2021-12-26 ENCOUNTER — Other Ambulatory Visit: Payer: Self-pay

## 2021-12-26 ENCOUNTER — Encounter: Payer: Self-pay | Admitting: Physician Assistant

## 2021-12-26 VITALS — BP 164/83 | HR 73

## 2021-12-26 DIAGNOSIS — N3281 Overactive bladder: Secondary | ICD-10-CM

## 2021-12-26 DIAGNOSIS — N3001 Acute cystitis with hematuria: Secondary | ICD-10-CM

## 2021-12-26 LAB — URINALYSIS, ROUTINE W REFLEX MICROSCOPIC
Bilirubin, UA: NEGATIVE
Glucose, UA: NEGATIVE
Ketones, UA: NEGATIVE
Nitrite, UA: POSITIVE — AB
Protein,UA: NEGATIVE
Specific Gravity, UA: 1.015 (ref 1.005–1.030)
Urobilinogen, Ur: 0.2 mg/dL (ref 0.2–1.0)
pH, UA: 7 (ref 5.0–7.5)

## 2021-12-26 LAB — MICROSCOPIC EXAMINATION

## 2021-12-26 MED ORDER — MIRABEGRON ER 25 MG PO TB24: 25.0000 mg | ORAL_TABLET | Freq: Every day | ORAL | 0 refills | Status: DC

## 2021-12-26 MED ORDER — NITROFURANTOIN MONOHYD MACRO 100 MG PO CAPS: 100.0000 mg | ORAL_CAPSULE | Freq: Two times a day (BID) | ORAL | 0 refills | Status: AC

## 2021-12-26 NOTE — Progress Notes (Signed)

## 2021-12-26 NOTE — Progress Notes (Signed)
12/26/2021 11:27 AM   Kelsey Nelson 1934/11/30 [id number]  Referring provider: Fayrene Helper, MD [address], [place],  [place] [address]  No chief complaint on file.   HPI: 12/26/21  The pt returns for recheck of OAB sxs, nocturia, and recurrent UTI. She has done well HS nitrofurantoin, but states she ran out of her rx approx 2 weeks ago and stopped taking it. She continues to have nocturia "almost every hour". No dysuria, hematuria. UA-6-10WBC, many bacteria   10/19/21 Kelsey Nelson is a 88yo here for followup for OAB and recurrent UTI. No UTIs since last visit. She denies any daytime frequency. She has nocturia 8x which are large volumes. She was diagnosed with OSA and was prescribed a CPAP which she is not using. Urine stream strong.   PMH: Past Medical History:  Diagnosis Date   Acute cystitis with hematuria 10/26/2013   Allergic rhinitis 11/08/2017   Blindness of right eye    BMI between 19-24,adult 2010 145 LBS   Diverticula of small intestine Dec 2010   Diverticulosis    [place]   DIVERTICULOSIS, SIGMOID COLON 03/31/2008   Qualifier: Diagnosis of  By: Dierdre Harness  Noted on abdominal and pelvic scan in 04/2011.  Extensive colonic diverticulosis , and possible muscular hypertrophy at rectosigmoid junction    Family hx of colon cancer 11/09/2013   Functional constipation 2010   GERD (gastroesophageal reflux disease)    HTN (hypertension)    Hyperlipemia    Pancreatic cyst 02/05/2013   Renal cyst 05/27/2011   Right eye trauma 1949   Loss of vision resulted at age 66   Schatzki's ring Dec 2010   Skin lesions, generalized 06/19/2015   Tubular adenoma 11/09/2013    Surgical History: Past Surgical History:  Procedure Laterality Date   ABDOMINAL HYSTERECTOMY     BREAST REDUCTION SURGERY  1992 BIL   COLONOSCOPY  DEC 2010   SIMPLE ADENOMAS (6-8), [place] TICS, [facility] IH   COLONOSCOPY N/A 12/08/2013   Procedure: COLONOSCOPY;  Surgeon: Rogene Houston, MD;  Location: [facility]  END[facility];  Service: Endoscopy;  Laterality: N/A;  225   EVISCERATION Right 05/08/2015   Procedure: EVISCERATION WITH IMPLANT RIGHT EYE;  Surgeon: Williams Che, MD;  Location: [facility] ORS;  Service: Ophthalmology;  Laterality: Right;   EYE SURGERY  RIGHT 2005   EYE SURGERY  right 2016   removed and false eye placed in 08/2015   TOTAL ABDOMINAL HYSTERECTOMY W/ BILATERAL SALPINGOOPHORECTOMY  1985 FIBROIDS   UPPER GASTROINTESTINAL ENDOSCOPY  DEC 2010   Bx-MILD GASTRITIS/DUODENITIS, DUODENAL DIVERTICULA, SCHATZKI'S RING    Home Medications:  Allergies as of 12/26/2021       Reactions   Codeine    Crestor [rosuvastatin] Other (See Comments)   Can't recall   Fosamax [alendronate Sodium]    heartburn        Medication List        Accurate as of December 26, 2021 11:27 AM. If you have any questions, ask your nurse or doctor.          STOP taking these medications    CALCIUM 1200+D3 PO Stopped by: Summerlin, Berneice Heinrich, PA-C   Lumigan 0.01 % Soln Generic drug: bimatoprost Stopped by: Summerlin, Berneice Heinrich, PA-C   multivitamin-lutein Caps capsule Stopped by: Reynaldo Minium, PA-C   nitrofurantoin 50 MG capsule Commonly known as: MACRODANTIN Stopped by: Summerlin, Berneice Heinrich, PA-C       TAKE these medications  conjugated estrogens 0.625 MG/GM vaginal cream Commonly known as: PREMARIN Apply one fingertip to introitus every 2 days.   docusate sodium 100 MG capsule Commonly known as: Colace Take 2 capsules (200 mg total) by mouth daily.   famotidine 20 MG tablet Commonly known as: Pepcid Take 1 tablet (20 mg total) by mouth daily before supper.   GAS-X PO Take by mouth. Per Patient she takes a couple (2) a day.   Ginger Root 550 MG Caps Take by mouth daily.   Magnesium 400 MG Caps Take by mouth daily.   mirabegron ER 25 MG Tb24 tablet Commonly known as: MYRBETRIQ Take 1 tablet (25 mg total) by mouth daily for 28 days. Started  by: Reynaldo Minium, PA-C   nitrofurantoin (macrocrystal-monohydrate) 100 MG capsule Commonly known as: Macrobid Take 1 capsule (100 mg total) by mouth 2 (two) times daily for 7 days.   OVER THE COUNTER MEDICATION Multi Collagen Protein - Patient states that she takes 1 scoop a day.   PROBIOTIC DAILY PO Take by mouth daily.   ramipril 10 MG capsule Commonly known as: ALTACE TAKE (1) CAPSULE BY MOUTH ONCE DAILY.   TUMS PO Take by mouth. Per patient she takes as needed for acid reflux , heart burn. Maybe 2 a day.   vitamin C 500 MG tablet Commonly known as: ASCORBIC ACID Take 500 mg by mouth daily.   VITAMIN D3 PO Take 25 mcg by mouth.   VITAMIN-B COMPLEX PO Take by mouth daily. With Vitamin B12 included.   Zinc 50 MG Tabs Take by mouth daily.        Allergies:  Allergies  Allergen Reactions   Codeine    Crestor [Rosuvastatin] Other (See Comments)    Can't recall   Fosamax [Alendronate Sodium]     heartburn    Family History: Family History  Problem Relation Age of Onset   Pancreatic cancer Sister    Colon cancer Paternal Aunt    Colon polyps Neg Hx     Social History:  reports that she has never smoked. She has never used smokeless tobacco. She reports that she does not drink alcohol and does not use drugs.  ROS: All other review of systems were reviewed and are negative except what is noted above in HPI  Physical Exam: BP (!) 164/83    Pulse 73   Constitutional:  Alert and oriented, No acute distress. HEENT: [place] AT, Trachea midline Cardiovascular: No clubbing, cyanosis, or edema. Respiratory: Normal respiratory effort, no increased work of breathing. GI: Abdomen is soft, suprapubic tenderness, nondistended, no abdominal masses GU: No CVA tenderness.  Skin: No rashes, bruises or suspicious lesions. Neurologic: Grossly intact, no focal deficits, moving all 4 extremities. Psychiatric: Normal mood and affect.  Laboratory Data: Urine  Culture, Routine Final report Abnormal    Organism ID, Bacteria Escherichia coli Abnormal    Comment: Cefazolin <=4 ug/mL  Cefazolin with an MIC <=16 predicts susceptibility to the oral agents  cefaclor, cefdinir, cefpodoxime, cefprozil, cefuroxime, cephalexin,  and loracarbef when used for therapy of uncomplicated urinary tract  infections due to E. coli, Klebsiella pneumoniae, and Proteus  mirabilis.  Greater than 100,000 colony forming units per mL   ORGANISM ID, BACTERIA Not applicable   Antimicrobial Susceptibility Comment   Comment:       ** S = Susceptible; I = Intermediate; R = Resistant **  P = Positive; N = Negative              MICS are expressed in micrograms per mL     Antibiotic                 RSLT#1    RSLT#2    RSLT#3    RSLT#4  Amoxicillin/Clavulanic Acid    S  Ampicillin                     R  Cefepime                       S  Ceftriaxone                    S  Cefuroxime                     S  Ciprofloxacin                  S  Ertapenem                      S  Gentamicin                     R  Imipenem                       S  Levofloxacin                   S  Meropenem                      S  Nitrofurantoin                 S  Piperacillin/Tazobactam        S  Tetracycline                   R  Tobramycin                     I  Trimethoprim/Sulfa             R    Lab Results  Component Value Date   WBC 5.9 07/27/2021   HGB 13.8 07/27/2021   HCT 41.4 07/27/2021   MCV 90 07/27/2021   PLT 253 07/27/2021    Lab Results  Component Value Date   CREATININE 0.87 07/27/2021    No results found for: PSA  No results found for: TESTOSTERONE  Lab Results  Component Value Date   HGBA1C 5.6 09/03/2016    Urinalysis    Component Value Date/Time   COLORURINE STRAW (A) 12/04/2018 1058   APPEARANCEUR Cloudy (A) 10/19/2021 1015   LABSPEC 1.003 (L) 12/04/2018 1058   PHURINE 7.0 12/04/2018 1058   GLUCOSEU Negative 10/19/2021 1015   HGBUR  SMALL (A) 12/04/2018 1058   BILIRUBINUR Negative 10/19/2021 1015   KETONESUR negative 05/10/2021 0823   KETONESUR NEGATIVE 12/04/2018 1058   PROTEINUR Negative 10/19/2021 1015   PROTEINUR NEGATIVE 12/04/2018 1058   UROBILINOGEN 0.2 05/10/2021 0823   UROBILINOGEN 0.2 07/14/2009 2018   NITRITE Positive (A) 10/19/2021 1015   NITRITE NEGATIVE 12/04/2018 1058   LEUKOCYTESUR 2+ (A) 10/19/2021 1015    Lab Results  Component Value Date   LABMICR Comment 10/19/2021   WBCUA 6-10 (A) 08/17/2021   LABEPIT >10 (A) 08/17/2021  BACTERIA Many (A) 08/17/2021    Pertinent Imaging: No recent imaging Results for orders placed during the hospital encounter of 07/31/12  DG Abd 1 View  Narrative *RADIOLOGY REPORT*  Clinical Data: History of constipation.  Abdominal pain.  ABDOMEN - 1 VIEW  Comparison: 06/21/2011  Findings: Moderate stool burden within the colon, slightly decreased in the left colon since prior study.  No evidence of bowel obstruction.  No free air.  No organomegaly or acute bony abnormality.  Calcified phleboliths in the anatomic pelvis.  IMPRESSION: Moderate stool burden, decreased from prior study.  No acute findings.  Original Report Authenticated By: Raelyn Number, M.D.  No results found for this or any previous visit.  No results found for this or any previous visit.  No results found for this or any previous visit.  No results found for this or any previous visit.  No results found for this or any previous visit.  No results found for this or any previous visit.  No results found for this or any previous visit.   Assessment & Plan:    1. OAB (overactive bladder)  - Urinalysis, Routine w reflex microscopic Samples of Myrbetriq 25mg  given x 28 days. The pt will start med after tx for UTI.  2. Recurrent UTI Urine cx ordered Macrobid BID for 7 days, then resume HS dosing pending cx result.  Return in about 3 months (around 03/26/2022) for OAB and  recurrent UTIs.  Summerlin, Berneice Heinrich, PA-C  [facility] [place]

## 2021-12-31 LAB — URINE CULTURE

## 2022-01-01 ENCOUNTER — Telehealth: Payer: Self-pay

## 2022-01-01 NOTE — Telephone Encounter (Signed)
Patient called and made aware.

## 2022-01-01 NOTE — Telephone Encounter (Signed)
-----   Message from Kelsey Nelson, [place] sent at 12/31/2021  3:51 PM EST ----- Pt on Macrobid and culture shows sensitivity. Pt needs to complete Macrobid Rx. Keep appt with Dr. Aline August in April ----- Message ----- From: Interface, [facility] Lab Results In Sent: 12/26/2021   3:17 PM EST To: Kelsey Minium, PA-C

## 2022-01-11 DIAGNOSIS — Z4421 Encounter for fitting and adjustment of artificial right eye: Secondary | ICD-10-CM | POA: Diagnosis not present

## 2022-01-15 DIAGNOSIS — E782 Mixed hyperlipidemia: Secondary | ICD-10-CM | POA: Diagnosis not present

## 2022-01-15 DIAGNOSIS — I10 Essential (primary) hypertension: Secondary | ICD-10-CM | POA: Diagnosis not present

## 2022-01-16 LAB — CMP14+EGFR
ALT: 19 IU/L (ref 0–32)
AST: 24 IU/L (ref 0–40)
Albumin/Globulin Ratio: 1.7 (ref 1.2–2.2)
Albumin: 4.2 g/dL (ref 3.6–4.6)
Alkaline Phosphatase: 137 IU/L — ABNORMAL HIGH (ref 44–121)
BUN/Creatinine Ratio: 17 (ref 12–28)
BUN: 15 mg/dL (ref 8–27)
Bilirubin Total: 0.3 mg/dL (ref 0.0–1.2)
CO2: 28 mmol/L (ref 20–29)
Calcium: 9.7 mg/dL (ref 8.7–10.3)
Chloride: 103 mmol/L (ref 96–106)
Creatinine, Ser: 0.86 mg/dL (ref 0.57–1.00)
Globulin, Total: 2.5 g/dL (ref 1.5–4.5)
Glucose: 94 mg/dL (ref 70–99)
Potassium: 4.5 mmol/L (ref 3.5–5.2)
Sodium: 143 mmol/L (ref 134–144)
Total Protein: 6.7 g/dL (ref 6.0–8.5)
eGFR: 66 mL/min/{1.73_m2} (ref >59)

## 2022-01-16 LAB — LIPID PANEL
Chol/HDL Ratio: 3.2 ratio (ref 0.0–4.4)
Cholesterol, Total: 279 mg/dL — ABNORMAL HIGH (ref 100–199)
HDL: 87 mg/dL (ref >39)
LDL Chol Calc (NIH): 167 mg/dL — ABNORMAL HIGH (ref 0–99)
Triglycerides: 143 mg/dL (ref 0–149)
VLDL Cholesterol Cal: 25 mg/dL (ref 5–40)

## 2022-01-20 NOTE — Progress Notes (Unsigned)
This encounter was created in error - please disregard.

## 2022-01-20 NOTE — Patient Instructions (Signed)

## 2022-01-21 ENCOUNTER — Encounter (INDEPENDENT_AMBULATORY_CARE_PROVIDER_SITE_OTHER): Payer: Self-pay

## 2022-01-21 ENCOUNTER — Other Ambulatory Visit: Payer: Self-pay

## 2022-01-21 DIAGNOSIS — Z0000 Encounter for general adult medical examination without abnormal findings: Secondary | ICD-10-CM

## 2022-01-22 ENCOUNTER — Encounter: Payer: Self-pay | Admitting: Family Medicine

## 2022-01-22 ENCOUNTER — Ambulatory Visit: Payer: Medicare PPO

## 2022-01-22 ENCOUNTER — Ambulatory Visit (INDEPENDENT_AMBULATORY_CARE_PROVIDER_SITE_OTHER): Payer: Medicare PPO | Admitting: Family Medicine

## 2022-01-22 ENCOUNTER — Encounter (INDEPENDENT_AMBULATORY_CARE_PROVIDER_SITE_OTHER): Payer: Self-pay

## 2022-01-22 ENCOUNTER — Other Ambulatory Visit: Payer: Self-pay

## 2022-01-22 VITALS — BP 138/81 | HR 66 | Resp 16 | Ht 66.0 in | Wt 128.4 lb

## 2022-01-22 DIAGNOSIS — N3001 Acute cystitis with hematuria: Secondary | ICD-10-CM | POA: Diagnosis not present

## 2022-01-22 DIAGNOSIS — F5104 Psychophysiologic insomnia: Secondary | ICD-10-CM

## 2022-01-22 DIAGNOSIS — R3915 Urgency of urination: Secondary | ICD-10-CM | POA: Diagnosis not present

## 2022-01-22 DIAGNOSIS — Z0000 Encounter for general adult medical examination without abnormal findings: Secondary | ICD-10-CM

## 2022-01-22 DIAGNOSIS — G4709 Other insomnia: Secondary | ICD-10-CM

## 2022-01-22 DIAGNOSIS — N390 Urinary tract infection, site not specified: Secondary | ICD-10-CM

## 2022-01-22 DIAGNOSIS — R350 Frequency of micturition: Secondary | ICD-10-CM

## 2022-01-22 DIAGNOSIS — L989 Disorder of the skin and subcutaneous tissue, unspecified: Secondary | ICD-10-CM

## 2022-01-22 DIAGNOSIS — H04123 Dry eye syndrome of bilateral lacrimal glands: Secondary | ICD-10-CM | POA: Diagnosis not present

## 2022-01-22 DIAGNOSIS — R109 Unspecified abdominal pain: Secondary | ICD-10-CM

## 2022-01-22 LAB — POCT URINALYSIS DIP (CLINITEK)
Bilirubin, UA: NEGATIVE
Glucose, UA: NEGATIVE mg/dL
Ketones, POC UA: NEGATIVE mg/dL
Nitrite, UA: POSITIVE — AB
POC PROTEIN,UA: NEGATIVE
Spec Grav, UA: 1.025 (ref 1.010–1.025)
Urobilinogen, UA: 0.2 E.U./dL
pH, UA: 6.5 (ref 5.0–8.0)

## 2022-01-22 MED ORDER — HYOSCYAMINE SULFATE 0.125 MG PO TBDP: ORAL_TABLET | ORAL | 3 refills | Status: DC

## 2022-01-22 MED ORDER — IMIPRAMINE HCL 10 MG PO TABS: 10.0000 mg | ORAL_TABLET | Freq: Every day | ORAL | 3 refills | Status: DC

## 2022-01-22 MED ORDER — MIRABEGRON ER 25 MG PO TB24: 25.0000 mg | ORAL_TABLET | Freq: Every day | ORAL | 3 refills | Status: DC

## 2022-01-22 NOTE — Assessment & Plan Note (Signed)
Sleep hygiene reviewed and written information offered also. Prescription sent for  medication needed.  

## 2022-01-22 NOTE — Assessment & Plan Note (Signed)
2 erythematous lesions on right forearm, age over 1 year an 1 week, refer derm

## 2022-01-22 NOTE — Patient Instructions (Signed)

## 2022-01-22 NOTE — Progress Notes (Signed)
° ° °  Kelsey Nelson     MRN: [id number]      DOB: Apr 24, 1934  HPI: Patient is in for annual physical exam. C/o insomnia, chronic and unchanged, was being seen by Neurology, no change, also nocturia, up every hour C/o recurrent UTI chronic and unchanged, sees Urology C/o red skin lesions x 2 on right forearm, one present for over 1 year, the other x 1 week Immunization is reviewed .Also recent labs   PE: BP 138/81    Pulse 66    Resp 16    Ht 5\' 6"  (1.676 m)    Wt 128 lb 6.4 oz (58.2 kg)    SpO2 96%    BMI 20.72 kg/m   Pleasant  female, alert and oriented x 3, in no cardio-pulmonary distress. Afebrile. HEENT No facial trauma or asymetry. Sinuses non tender.  Extra occullar muscles intact.. External ears normal, . Neck: supple, no adenopathy,JVD or thyromegaly.No bruits.  Chest: Clear to ascultation bilaterally.No crackles or wheezes. Non tender to palpation  Cardiovascular system; Heart sounds normal,  S1 and  S2 ,no S3.  No murmur, or thrill. Apical beat not displaced Peripheral pulses normal.  Abdomen: Soft, non tender,      Musculoskeletal exam: Full ROM of spine, hips , shoulders and knees. No deformity ,swelling or crepitus noted. No muscle wasting or atrophy.   Neurologic: Cranial nerves 2 to 12 intact. Power, tone ,sensation and reflexes normal throughout. No disturbance in gait. No tremor.  Skin: Intact, no ulceration,  ,erythematous, annular macular rashes noted x 2 on right forearm, irregular borders  Psych; Normal mood and affect. Judgement and concentration normal   Assessment & Plan:  Annual physical exam Annual exam as documented.  Immunization  needs are specifically addressed at this visit.   Skin lesions 2 erythematous lesions on right forearm, age over 1 year an 1 week, refer derm  Insomnia Sleep hygiene reviewed and written information offered also. Prescription sent for  medication needed.   Recurrent UTI Denies dysuria or  malodorous urine, often discovered on "routine testing" CCUA in office abn, will send for c/s  Urinary urgency Good response to mirabegron and topical premarin, however med cost a challenge, depends on samples form Urology, will refer to pharmacist  Abdominal spasms Levbid prescribed as before as reports response to this

## 2022-01-22 NOTE — Assessment & Plan Note (Addendum)
Annual exam as documented.  Immunization needs are specifically addressed at this visit.  

## 2022-01-22 NOTE — Progress Notes (Signed)
Subjective:   Kelsey Nelson is a 88 y.o. female who presents for Medicare Annual (Subsequent) preventive examination. Pt came in office. Review of Systems    Defer to PCP Cardiac Risk Factors include: advanced age (>26men, >45 women);hypertension     Objective:    There were no vitals filed for this visit. There is no height or weight on file to calculate BMI.  Advanced Directives 01/22/2022 01/20/2022 01/18/2021 05/05/2019 01/06/2019 12/04/2018 01/05/2018  Does Patient Have a Medical Advance Directive? Yes No No;Yes Yes Yes No;Yes Yes  Type of Paramedic of [place];Living will;Out of facility DNR (pink MOST or yellow form) - Living will [place];Living will Healthcare Power of [place];Living will -  Does patient want to make changes to medical advance directive? - - No - Patient declined No - Patient declined No - Patient declined - -  Copy of [place] in Chart? Yes - validated most recent copy scanned in chart (See row information) - - No - copy requested Yes - validated most recent copy scanned in chart (See row information) - -  Would patient like information on creating a medical advance directive? No - Patient declined No - Patient declined No - Patient declined - - - -  Pre-existing out of facility DNR order (yellow form or pink MOST form) - - - - - - -    Current Medications (verified) Outpatient Encounter Medications as of 01/22/2022  Medication Sig   B Complex Vitamins (VITAMIN-B COMPLEX PO) Take by mouth daily. With Vitamin B12 included.   Calcium Carbonate Antacid (TUMS PO) Take by mouth. Per patient she takes as needed for acid reflux , heart burn. Maybe 2 a day.   Cholecalciferol (VITAMIN D3 PO) Take 25 mcg by mouth.   conjugated estrogens (PREMARIN) vaginal cream Apply one fingertip to introitus every 2 days.   docusate sodium (COLACE) 100 MG capsule Take 2 capsules (200 mg total)  by mouth daily.   famotidine (PEPCID) 20 MG tablet Take 1 tablet (20 mg total) by mouth daily before supper.   Ginger, Zingiber officinalis, (GINGER ROOT) 550 MG CAPS Take by mouth daily.   hyoscyamine (ANASPAZ) 0.125 MG TBDP disintergrating tablet Place one tablet under tongue twice daily, as needed, for abdominal spasm   imipramine (TOFRANIL) 10 MG tablet Take 1 tablet (10 mg total) by mouth at bedtime.   Magnesium 400 MG CAPS Take by mouth daily.   mirabegron ER (MYRBETRIQ) 25 MG TB24 tablet Take 1 tablet (25 mg total) by mouth daily for 28 days.   OVER THE COUNTER MEDICATION Multi Collagen Protein - Patient states that she takes 1 scoop a day.   Probiotic Product (PROBIOTIC DAILY PO) Take by mouth daily.   ramipril (ALTACE) 10 MG capsule TAKE (1) CAPSULE BY MOUTH ONCE DAILY.   Simethicone (GAS-X PO) Take by mouth. Per Patient she takes a couple (2) a day.   vitamin C (ASCORBIC ACID) 500 MG tablet Take 500 mg by mouth daily.   Zinc 50 MG TABS Take by mouth daily.   Facility-Administered Encounter Medications as of 01/22/2022  Medication   hyoscyamine (LEVSIN SL) SL tablet 0.125 mg    Allergies (verified) Codeine, Crestor [rosuvastatin], and Fosamax [alendronate sodium]   History: Past Medical History:  Diagnosis Date   Acute cystitis with hematuria 10/26/2013   Allergic rhinitis 11/08/2017   Blindness of right eye    BMI between 19-24,adult 2010 145 LBS  Diverticula of small intestine Dec 2010   Diverticulosis    [place]   DIVERTICULOSIS, SIGMOID COLON 03/31/2008   Qualifier: Diagnosis of  By: Dierdre Harness  Noted on abdominal and pelvic scan in 04/2011.  Extensive colonic diverticulosis , and possible muscular hypertrophy at rectosigmoid junction    Family hx of colon cancer 11/09/2013   Functional constipation 2010   GERD (gastroesophageal reflux disease)    HTN (hypertension)    Hyperlipemia    Pancreatic cyst 02/05/2013   Renal cyst 05/27/2011   Right eye trauma 1949   Loss of  vision resulted at age 5   Schatzki's ring Dec 2010   Skin lesions, generalized 06/19/2015   Tubular adenoma 11/09/2013   Past Surgical History:  Procedure Laterality Date   ABDOMINAL HYSTERECTOMY     BREAST REDUCTION SURGERY  1992 BIL   COLONOSCOPY  DEC 2010   SIMPLE ADENOMAS (6-8), Joshua TICS, SML IH   COLONOSCOPY N/A 12/08/2013   Procedure: COLONOSCOPY;  Surgeon: Rogene Houston, MD;  Location: [facility];  Service: Endoscopy;  Laterality: N/A;  225   EVISCERATION Right 05/08/2015   Procedure: EVISCERATION WITH IMPLANT RIGHT EYE;  Surgeon: Williams Che, MD;  Location: [facility];  Service: Ophthalmology;  Laterality: Right;   EYE SURGERY  RIGHT 2005   EYE SURGERY  right 2016   removed and false eye placed in 08/2015   TOTAL ABDOMINAL HYSTERECTOMY W/ BILATERAL SALPINGOOPHORECTOMY  1985 FIBROIDS   UPPER GASTROINTESTINAL ENDOSCOPY  DEC 2010   Bx-MILD GASTRITIS/DUODENITIS, DUODENAL DIVERTICULA, SCHATZKI'S RING   Family History  Problem Relation Age of Onset   Pancreatic cancer Sister    Colon cancer Paternal Aunt    Colon polyps Neg Hx    Social History   Socioeconomic History   Marital status: Married    Spouse name: [name]    Number of children: 3   Years of education: 12   Highest education level: 12th grade  Occupational History   Occupation: retired   Tobacco Use   Smoking status: Never   Smokeless tobacco: Never  Vaping Use   Vaping Use: Never used  Substance and Sexual Activity   Alcohol use: No    Alcohol/week: 0.0 standard drinks   Drug use: No   Sexual activity: Not Currently  Other Topics Concern   Not on file  Social History Narrative   MARRIED TO DAVID Kostelnik. 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/   Social Determinants of Radio broadcast assistant Strain: Low Risk    Difficulty of Paying Living Expenses: Not hard at all  Food Insecurity: No Food Insecurity   Worried About Charity fundraiser in the Last Year: Never true   Ran Out of Food in the Last Year:  Never true  Transportation Needs: No Transportation Needs   Lack of Transportation (Medical): No   Lack of Transportation (Non-Medical): No  Physical Activity: Sufficiently Active   Days of Exercise per Week: 5 days   Minutes of Exercise per Session: 60 min  Stress: No Stress Concern Present   Feeling of Stress : Not at all  Social Connections: Socially Integrated   Frequency of Communication with Friends and Family: Never   Frequency of Social Gatherings with Friends and Family: More than three times a week   Attends Religious Services: More than 4 times per year   Active Member of [employer] or Organizations: Yes   Attends Archivist Meetings: More than 4 times per year   Marital  Status: Married    Tobacco Counseling Counseling given: Not Answered   Clinical Intake:  Pre-visit preparation completed: No  Pain : No/denies pain     Nutritional Risks: None Diabetes: No     Diabetic?no  Interpreter Needed?: No  Information entered by :: [place] of Daily Living In your present state of health, do you have any difficulty performing the following activities: 01/22/2022  Hearing? N  Vision? N  Difficulty concentrating or making decisions? Y  Walking or climbing stairs? N  Dressing or bathing? N  Doing errands, shopping? N  Preparing Food and eating ? N  Using the Toilet? N  In the past six months, have you accidently leaked urine? N  Do you have problems with loss of bowel control? N  Managing your Medications? Y  Managing your Finances? Y  Housekeeping or managing your Housekeeping? Y  Some recent data might be hidden    Patient Care Team: Fayrene Helper, MD as PCP - General (Family Medicine) Danie Binder, MD (Inactive) (Gastroenterology) Rogene Houston, MD (Gastroenterology) Virgia Land, MD as Referring Physician (Internal Medicine)  Indicate any recent Medical Services you may have received from other than [facility] providers in the  past year (date may be approximate).     Assessment:   This is a routine wellness examination for Kelsey Nelson.  Hearing/Vision screen No results found.  Dietary issues and exercise activities discussed: Current Exercise Habits: Home exercise routine, Type of exercise: walking, Time (Minutes): 60, Frequency (Times/Week): 5, Weekly Exercise (Minutes/Week): 300, Intensity: Moderate, Exercise limited by: None identified   Goals Addressed             This Visit's Progress    DIET - INCREASE WATER INTAKE   On track     Depression Screen PHQ 2/9 Scores 01/22/2022 03/07/2021 01/18/2021 01/18/2021 01/10/2021 12/18/2020 10/24/2020  PHQ - 2 Score 0 0 2 2 2  0 3  PHQ- 9 Score - - 7 8 3 4 15     Fall Risk Fall Risk  01/22/2022 01/22/2022 10/26/2021 07/24/2021 05/10/2021  Falls in the past year? 0 0 0 0 0  Number falls in past yr: 0 0 0 0 0  Injury with Fall? 0 0 0 0 0  Risk for fall due to : No Fall Risks - - - -  Follow up Falls evaluation completed - - - -    FALL RISK PREVENTION PERTAINING TO THE HOME:  Any stairs in or around the home? No  If so, are there any without handrails? No  Home free of loose throw rugs in walkways, pet beds, electrical cords, etc? No  Adequate lighting in your home to reduce risk of falls? Yes   ASSISTIVE DEVICES UTILIZED TO PREVENT FALLS:  Life alert? No  Use of a cane, walker or w/c? No  Grab bars in the bathroom? Yes  Shower chair or bench in shower? Yes  Elevated toilet seat or a handicapped toilet? No   TIMED UP AND GO:  Was the test performed? Yes .  Length of time to ambulate 10 feet: 7 sec.   Gait steady and fast without use of assistive device  Cognitive Function:     6CIT Screen 01/22/2022 01/18/2021 05/30/2020 01/13/2020 01/06/2019  What Year? 0 points 0 points 0 points 0 points 0 points  What month? 0 points 0 points 0 points 0 points 0 points  What time? 0 points 0 points 0 points 0 points 0 points  Count  back from 20 0 points 0 points 0 points 0  points 0 points  Months in reverse 0 points 0 points 0 points 0 points 0 points  Repeat phrase 0 points 0 points 0 points 0 points 0 points  Total Score 0 0 0 0 0    Immunizations Immunization History  Administered Date(s) Administered   Fluad Quad(high Dose 65+) 10/11/2019, 09/12/2020   Influenza Split 09/24/2012, 10/19/2015   Influenza Whole 12/31/2006, 11/06/2009, 10/29/2011   Influenza, High Dose Seasonal PF 09/28/2018   Influenza,inj,Quad PF,6+ Mos 10/26/2013, 12/05/2014, 10/25/2016   Influenza-Unspecified 10/06/2017, 10/06/2021   Janssen (J&J) SARS-COV-2 Vaccination 05/24/2020, 10/03/2021   Pneumococcal Conjugate-13 07/11/2014   Pneumococcal Polysaccharide-23 12/23/2001   Td 08/06/2004   Tdap 06/05/2015   Zoster Recombinat (Shingrix) 08/03/2021, 01/02/2022   Zoster, Live 04/16/2007    TDAP status: Up to date  Flu Vaccine status: Up to date  Pneumococcal vaccine status: Up to date  Covid-19 vaccine status: Information provided on how to obtain vaccines.   Qualifies for Shingles Vaccine? Yes   Zostavax completed No   Shingrix Completed?: No.    Education has been provided regarding the importance of this vaccine. Patient has been advised to call insurance company to determine out of pocket expense if they have not yet received this vaccine. Advised may also receive vaccine at local pharmacy or Health Dept. Verbalized acceptance and understanding.  Screening Tests Health Maintenance  Topic Date Due   COVID-19 Vaccine (3 - Booster for Janssen series) 11/28/2021   TETANUS/TDAP  06/04/2025   Pneumonia Vaccine 47+ Years old  Completed   INFLUENZA VACCINE  Completed   DEXA SCAN  Completed   Zoster Vaccines- Shingrix  Completed   HPV VACCINES  Aged Out    Health Maintenance  Health Maintenance Due  Topic Date Due   COVID-19 Vaccine (3 - Booster for Janssen series) 11/28/2021    Colorectal cancer screening: No longer required.   Mammogram status: No longer  required due to age.  Bone Density status: Completed 01/13/2019. Results reflect: Bone density results: OSTEOPENIA. Repeat every 5 years.  Lung Cancer Screening: (Low Dose CT Chest recommended if Age 13-80 years, 30 pack-year currently smoking OR have quit w/in 15years.) does not qualify.   Lung Cancer Screening Referral: n/a  Additional Screening:  Hepatitis C Screening: does not qualify; Completed not at high risk  Vision Screening: Recommended annual ophthalmology exams for early detection of glaucoma and other disorders of the eye. Is the patient up to date with their annual eye exam?  Yes  Who is the provider or what is the name of the office in which the patient attends annual eye exams? Dr Katy Fitch If pt is not established with a provider, would they like to be referred to a provider to establish care? No .   Dental Screening: Recommended annual dental exams for proper oral hygiene  Community Resource Referral / Chronic Care Management: CRR required this visit?  No   CCM required this visit?  No      Plan:     I have personally reviewed and noted the following in the patients chart:   Medical and social history Use of alcohol, tobacco or illicit drugs  Current medications and supplements including opioid prescriptions.  Functional ability and status Nutritional status Physical activity Advanced directives List of other physicians Hospitalizations, surgeries, and ER visits in previous 12 months Vitals Screenings to include cognitive, depression, and falls Referrals and appointments  In addition, I have reviewed  and discussed with patient certain preventive protocols, quality metrics, and best practice recommendations. A written personalized care plan for preventive services as well as general preventive health recommendations were provided to patient.     Earline Mayotte, [place]   01/22/2022   Nurse Notes:  Kelsey Nelson , Thank you for taking time to come for your  Medicare Wellness Visit. I appreciate your ongoing commitment to your health goals. Please review the following plan we discussed and let me know if I can assist you in the future.   These are the goals we discussed:  Goals      DIET - INCREASE WATER INTAKE        This is a list of the screening recommended for you and due dates:  Health Maintenance  Topic Date Due   COVID-19 Vaccine (3 - Booster for Janssen series) 11/28/2021   Tetanus Vaccine  06/04/2025   Pneumonia Vaccine  Completed   Flu Shot  Completed   DEXA scan (bone density measurement)  Completed   Zoster (Shingles) Vaccine  Completed   HPV Vaccine  Aged Out

## 2022-01-22 NOTE — Assessment & Plan Note (Signed)
Denies dysuria or malodorous urine, often discovered on "routine testing" CCUA in office abn, will send for c/s

## 2022-01-22 NOTE — Assessment & Plan Note (Signed)
Levbid prescribed as before as reports response to this

## 2022-01-22 NOTE — Patient Instructions (Signed)
F/U in 10 weeks, call if you need me  before  New medications are hyoscyamine and imipramine  Please submit urine for CCUA with reflex culture when able   You are referred to pharmacist and to Dermatology  Thanks for choosing [facility], we consider it a privelige to serve you. '  Cholesterol is improved, which is great

## 2022-01-22 NOTE — Assessment & Plan Note (Signed)
Good response to mirabegron and topical premarin, however med cost a challenge, depends on samples form Urology, will refer to pharmacist

## 2022-01-26 ENCOUNTER — Other Ambulatory Visit: Payer: Self-pay | Admitting: Family Medicine

## 2022-01-26 LAB — URINE CULTURE

## 2022-01-26 MED ORDER — SULFAMETHOXAZOLE-TRIMETHOPRIM 800-160 MG PO TABS: 1.0000 | ORAL_TABLET | Freq: Two times a day (BID) | ORAL | 0 refills | Status: DC

## 2022-01-29 ENCOUNTER — Encounter: Payer: PPO | Admitting: Family Medicine

## 2022-01-29 ENCOUNTER — Telehealth: Payer: Self-pay | Admitting: *Deleted

## 2022-01-29 DIAGNOSIS — Z1283 Encounter for screening for malignant neoplasm of skin: Secondary | ICD-10-CM | POA: Diagnosis not present

## 2022-01-29 DIAGNOSIS — L821 Other seborrheic keratosis: Secondary | ICD-10-CM | POA: Diagnosis not present

## 2022-01-29 DIAGNOSIS — L308 Other specified dermatitis: Secondary | ICD-10-CM | POA: Diagnosis not present

## 2022-01-29 DIAGNOSIS — L820 Inflamed seborrheic keratosis: Secondary | ICD-10-CM | POA: Diagnosis not present

## 2022-01-29 NOTE — Chronic Care Management (AMB) (Signed)
°  Chronic Care Management   Outreach Note  01/29/2022 Name: [name] MRN: [id number] DOB: December 30, 1933  Kelsey Nelson is a 88 y.o. year old female who is a primary care patient of Moshe Cipro Norwood Levo, MD. I reached out to [name] by phone today in response to a referral sent by Ms. Twanna Hy Wooldridge's primary care provider.  An unsuccessful telephone outreach was attempted today. The patient was referred to the case management team for assistance with care management and care coordination.   Follow Up Plan: A HIPAA compliant phone message was left for the patient providing contact information and requesting a return call.  The care management team will reach out to the patient again over the next 7 days.  If patient returns call to provider office, please advise to call [place] * at [phone].*  [place] Management  Direct Dial: [phone]

## 2022-01-30 NOTE — Chronic Care Management (AMB) (Signed)
Chronic Care Management   Note  01/30/2022 Name: Kelsey Nelson MRN: [id number] DOB: [birth year]  Kelsey Nelson is a 88 y.o. year old female who is a primary care patient of Moshe Cipro Norwood Levo, MD. I reached out to [name] by phone today in response to a referral sent by [name]'s PCP.  [name] was given information about Chronic Care Management services today including:  CCM service includes personalized support from designated clinical staff supervised by her physician, including individualized plan of care and coordination with other care providers 24/7 contact phone numbers for assistance for urgent and routine care needs. Service will only be billed when office clinical staff spend 20 minutes or more in a month to coordinate care. Only one practitioner may furnish and bill the service in a calendar month. The patient may stop CCM services at any time (effective at the end of the month) by phone call to the office staff. The patient is responsible for co-pay (up to 20% after annual deductible is met) if co-pay is required by the individual health plan.   Patient agreed to services and verbal consent obtained.   Follow up plan: Face to Face appointment with care management team member scheduled for: 01/31/22  [place] Management  Direct Dial: [phone]

## 2022-01-31 ENCOUNTER — Ambulatory Visit (INDEPENDENT_AMBULATORY_CARE_PROVIDER_SITE_OTHER): Payer: Medicare PPO | Admitting: Pharmacist

## 2022-01-31 ENCOUNTER — Other Ambulatory Visit: Payer: Self-pay

## 2022-01-31 ENCOUNTER — Encounter (INDEPENDENT_AMBULATORY_CARE_PROVIDER_SITE_OTHER): Payer: Self-pay

## 2022-01-31 VITALS — BP 118/66 | HR 91

## 2022-01-31 DIAGNOSIS — G4709 Other insomnia: Secondary | ICD-10-CM

## 2022-01-31 DIAGNOSIS — R351 Nocturia: Secondary | ICD-10-CM

## 2022-01-31 DIAGNOSIS — E785 Hyperlipidemia, unspecified: Secondary | ICD-10-CM

## 2022-01-31 DIAGNOSIS — I10 Essential (primary) hypertension: Secondary | ICD-10-CM

## 2022-01-31 NOTE — Chronic Care Management (AMB) (Signed)
Chronic Care Management Pharmacy Note  01/31/2022 Name:  Kelsey Nelson MRN:  [id number] DOB:  1934/08/06  Summary: General: Thorough medication reconciliation completed today. Medication list cleaned up as there were several duplicates. Patient reports nausea with Bactrim. Only took 5 tablets then discontinued. Still having same urinary tract infection symptoms as before antibiotic. Will notify primary care provider. Patient reports ongoing constipation, dry mouth, and acid reflux. Wonder if related to hyoscyamine. Patient reports she does not feel she is benefiting from hyoscyamine. Will recommend that primary care provider discuss risk/benefits.  Patient declines medication assistance. Reports she can afford her medication she just not like that it costs so much. She feels as if her money would be spent better elsewhere.   Hyperlipidemia Uncontrolled. LDL above goal of <100 due to high risk given at least 2 major CV risk factors (advancing age, elevated LDL cholesterol, and hypertension) and 10-year risk 10-20% per 2020 AACE/ACE guidelines. Triglycerides at goal of <150 per 2020 AACE/ACE guidelines. Current medications: patient reports taking Dr. Juluis Rainier Plus Max. Previously tried rosuvastatin but was unable to tolerate for unknown reason Encourage dietary reduction of high fat containing foods such as butter, nuts, bacon, egg yolks, etc. Will discuss patient care goals with primary care provider. May need to consider trial of another statin or ezetimibe to lower LDL to reduce cardiovascular risk.  Insomnia: Uncontrolled. Patient has tried trazodone, mirtazapine, temazepam, doxylamine, imipramine, and Belsomra) without much relief Current medications: imipramine 10 mg by mouth at bedtime Patient reports imipramine works sometimes Discussed ramelteon (melatonin agonist); however insurance does not cover. Recommend good sleep hygiene Keep sleep environment comfortable and conducive  to sleep Keep regular sleep schedule 7 nights a week Avoid naps during the day Avoid going to bed until drowsy and ready to sleep, not trying to sleep, and not watching the clock Get out of bed if not asleep within 15-20 minutes and return only when drowsy Avoid caffeine, nicotine, alcohol, and other substances that interfere with sleep before bedtime Exercise regularly (at least 6 hours before sleep)  Overactive Bladder: Follows with Dr. Alyson Ingles (Urologist) Uncontrolled per patient Current medications: Myrbetriq 25 mg by mouth daily Patient reports that she cannot tell a difference with Mrybetriq especially with high cost Recommended that patient discuss alternatives with urologist Discussed [place] patient assistance program. Offered give patient number to call for support but patient declines at this time.  Subjective: Kelsey Nelson is an 88 y.o. year old female who is a primary patient of Moshe Cipro, Norwood Levo, MD.  The CCM team was consulted for assistance with disease management and care coordination needs.    Engaged with patient face to face for initial visit in response to provider referral for pharmacy case management and/or care coordination services.   Consent to Services:  The patient was given the following information about Chronic Care Management services today, agreed to services, and gave verbal consent: 1. CCM service includes personalized support from designated clinical staff supervised by the primary care provider, including individualized plan of care and coordination with other care providers 2. 24/7 contact phone numbers for assistance for urgent and routine care needs. 3. Service will only be billed when office clinical staff spend 20 minutes or more in a month to coordinate care. 4. Only one practitioner may furnish and bill the service in a calendar month. 5.The patient may stop CCM services at any time (effective at the end of the month) by phone call to  the office staff. 6. The patient will be responsible for cost sharing (co-pay) of up to 20% of the service fee (after annual deductible is met). Patient agreed to services and consent obtained.  Patient Care Team: Fayrene Helper, MD as PCP - General (Family Medicine) Danie Binder, MD (Inactive) (Gastroenterology) Rogene Houston, MD (Gastroenterology) Virgia Land, MD as Referring Physician (Internal Medicine) Beryle Lathe, [facility] (Pharmacist)  Objective:  Lab Results  Component Value Date   CREATININE 0.86 01/15/2022   CREATININE 0.87 07/27/2021   CREATININE 0.96 08/04/2020    Lab Results  Component Value Date   HGBA1C 5.6 09/03/2016   Last diabetic Eye exam: No results found for: HMDIABEYEEXA  Last diabetic Foot exam: No results found for: HMDIABFOOTEX      Component Value Date/Time   CHOL 279 (H) 01/15/2022 0821   TRIG 143 01/15/2022 0821   HDL 87 01/15/2022 0821   CHOLHDL 3.2 01/15/2022 0821   CHOLHDL 2.5 10/01/2019 0726   VLDL 26 06/26/2017 0732   LDLCALC 167 (H) 01/15/2022 0821   LDLCALC 122 (H) 10/01/2019 0726    Hepatic Function Latest Ref Rng & Units 01/15/2022 07/27/2021 08/04/2020  Total Protein 6.0 - 8.5 g/dL 6.7 6.7 7.0  Albumin 3.6 - 4.6 g/dL 4.2 4.4 4.3  AST 0 - 40 IU/L _0 ALT 0 - 32 IU/L _1 Alk Phosphatase 44 - 121 IU/L 137(H) 131(H) 127(H)  Total Bilirubin 0.0 - 1.2 mg/dL 0.3 0.4 0.4  Bilirubin, Direct <=0.2 mg/dL - - -    Lab Results  Component Value Date/Time   TSH 3.180 07/27/2021 08:15 AM   TSH 2.810 08/04/2020 08:29 AM    CBC Latest Ref Rng & Units 07/27/2021 08/04/2020 06/08/2019  WBC 3.4 - 10.8 x10E3/uL 5.9 5.2 4.9  Hemoglobin 11.1 - 15.9 g/dL 13.8 14.0 14.1  Hematocrit 34.0 - 46.6 % 41.4 41.4 42.7  Platelets 150 - 450 x10E3/uL 253 297 263    Lab Results  Component Value Date/Time   VD25OH 53.9 07/27/2021 08:15 AM   VD25OH 29.7 (L) 08/04/2020 08:29 AM    Clinical ASCVD: No  The ASCVD Risk score (Arnett  DK, et al., 2019) failed to calculate for the following reasons:   The 2019 ASCVD risk score is only valid for ages 52 to 52    Social History   Tobacco Use  Smoking Status Never  Smokeless Tobacco Never   BP Readings from Last 3 Encounters:  01/31/22 118/66  01/22/22 138/81  12/26/21 (!) 164/83   Pulse Readings from Last 3 Encounters:  01/31/22 91  01/22/22 66  12/26/21 73   Wt Readings from Last 3 Encounters:  01/22/22 128 lb 6.4 oz (58.2 kg)  10/26/21 127 lb (57.6 kg)  10/19/21 127 lb 6.4 oz (57.8 kg)    Assessment: Review of patient past medical history, allergies, medications, health status, including review of consultants reports, laboratory and other test data, was performed as part of comprehensive evaluation and provision of chronic care management services.   SDOH:  (Social Determinants of Health) assessments and interventions performed:  SDOH Interventions    Flowsheet Row Most Recent Value  SDOH Interventions   Financial Strain Interventions Other (Comment)  [information given for medication assistance program for Myrbetriq]       CCM Care Plan  Allergies  Allergen Reactions   Codeine    Crestor [Rosuvastatin] Other (See Comments)    Can't recall   Fosamax [Alendronate Sodium]  heartburn    Medications Reviewed Today     Reviewed by Beryle Lathe, [facility] (Pharmacist) on 01/31/22 at 1329  Med List Status: <None>   Medication Order Taking? Sig Documenting Provider Last Dose Status Informant  B Complex Vitamins (VITAMIN-B COMPLEX PO) [id number] Yes Take 1 tablet by mouth daily. [provider] Taking Active Self  Calcium Carbonate Antacid (TUMS PO) [id number] Yes Take 1 tablet by mouth 3 (three) times daily as needed. [provider] Taking Active Self  Cholecalciferol (VITAMIN D3 PO) [id number] Yes Take 25 mcg by mouth daily. [provider] Taking Active Self  conjugated estrogens (PREMARIN) vaginal cream [id number]  Yes Apply one fingertip to introitus every 2 days.  Patient taking differently: Apply one fingertip to introitus every 2 days as needed   Perlie Mayo, NP Taking Active Self  famotidine (PEPCID) 20 MG tablet [id number] No Take 1 tablet (20 mg total) by mouth daily before supper.  Patient not taking: Reported on 01/31/2022   Rogene Houston, MD Not Taking Active   hyoscyamine (ANASPAZ) 0.125 MG TBDP disintergrating tablet [id number] Yes Place one tablet under tongue twice daily, as needed, for abdominal spasm Fayrene Helper, MD Taking Active   imipramine (TOFRANIL) 10 MG tablet [id number] Yes Take 1 tablet (10 mg total) by mouth at bedtime. Fayrene Helper, MD Taking Active   mirabegron ER [facility]) 25 MG TB24 tablet [id number] Yes Take 1 tablet (25 mg total) by mouth daily. Fayrene Helper, MD Taking Active   OVER THE COUNTER MEDICATION [id number] Yes Multi Collagen Protein - Patient states that she takes 1 scoop a day. [provider] Taking Active   Probiotic Product (PROBIOTIC DAILY PO) [id number] Yes Take 1 tablet by mouth daily. [provider] Taking Active Self  ramipril (ALTACE) 10 MG capsule [id number] Yes TAKE (1) CAPSULE BY MOUTH ONCE DAILY. Fayrene Helper, MD Taking Active   Simethicone (GAS-X PO) [id number] No Take by mouth. Per Patient she takes a couple (2) a day.  Patient not taking: Reported on 01/31/2022   [provider] Not Taking Active            Med Note Grayland Ormond   Tue Mar 13, 2021  1:50 PM) As needed.  sulfamethoxazole-trimethoprim (BACTRIM DS) 800-160 MG tablet [id number] No Take 1 tablet by mouth 2 (two) times daily.  Patient not taking: Reported on 01/31/2022   Fayrene Helper, MD Not Taking Active            Med Note Jim Like Jan 31, 2022  1:16 PM) Took 5 tablets then stopped due to nausea. Was taking with food.  vitamin C (ASCORBIC ACID) 500 MG tablet [id number] Yes Take 500 mg by mouth daily.  [provider] Taking Active   Zinc 50 MG TABS [id number] Yes Take 1 tablet by mouth daily. [provider] Taking Active Self            Patient Active Problem List   Diagnosis Date Noted   Skin lesions 01/22/2022   Abdominal spasms 01/22/2022   Blepharitis of left lower eyelid 10/28/2021   Recurrent UTI 05/10/2021   Skipped heart beats 10/19/2020   Dermatitis 07/31/2020   COVID-19 virus infection 02/25/2020   Anterior knee pain, right 10/05/2019   Belching symptom 10/05/2019   Injury of left knee 11/28/2017   Annual physical exam 03/23/2016   Ectropion of right eye 11/22/2015   Acquired superior sulcus deformity of orbit  07/04/2015   History of evisceration of eye 07/04/2015   Nuclear sclerosis of left eye 07/04/2015   Urinary urgency 10/31/2013   Loss of taste 07/31/2012   Pancreatic cyst 04/21/2012   H/O gastroesophageal reflux (GERD) 04/21/2012   Nocturia 12/02/2011   Insomnia 09/20/2011   Nausea 06/22/2011   Constipation 06/21/2011   GERD 11/20/2009   Mixed hyperlipidemia 03/31/2008   BLINDNESS, RIGHT EYE 03/31/2008   HTN (hypertension) 03/31/2008   Osteoporosis 03/31/2008    Immunization History  Administered Date(s) Administered   Fluad Quad(high Dose 65+) 10/11/2019, 09/12/2020   Influenza Split 09/24/2012, 10/19/2015   Influenza Whole 12/31/2006, 11/06/2009, 10/29/2011   Influenza, High Dose Seasonal PF 09/28/2018   Influenza,inj,Quad PF,6+ Mos 10/26/2013, 12/05/2014, 10/25/2016   Influenza-Unspecified 10/06/2017, 10/06/2021   Janssen (J&J) SARS-COV-2 Vaccination 05/24/2020, 10/03/2021   Pneumococcal Conjugate-13 07/11/2014   Pneumococcal Polysaccharide-23 12/23/2001   Td 08/06/2004   Tdap 06/05/2015   Zoster Recombinat (Shingrix) 08/03/2021, 01/02/2022   Zoster, Live 04/16/2007    Conditions to be addressed/monitored: HTN, HLD, insomnia and overactive bladder  Care Plan : Medication Management  Updates made by Beryle Lathe, [place] since 01/31/2022 12:00 AM     Problem: HTN, HLD, Insomnia, Overactive Bladder   Priority: High  Onset Date: 01/31/2022     Long-Range Goal: Disease Progression Prevention   Start Date: 01/31/2022  Expected End Date: 05/01/2022  This Visit's Progress: On track  Priority: High  Note:   Current Barriers:  Unable to achieve control of hyperlipidemia, insomnia, and overactive bladder  Pharmacist Clinical Goal(s):  Through collaboration with PharmD and provider, patient will  Achieve control of hyperlipidemia, insomnia, and overactive bladder as evidenced by improved LDL, improved sleep, and reduced nocturia    Interventions: 1:1 collaboration with Fayrene Helper, MD regarding development and update of comprehensive plan of care as evidenced by provider attestation and co-signature Inter-disciplinary care team collaboration (see longitudinal plan of care) Comprehensive medication review performed; medication list updated in electronic medical record  Hypertension - New goal.: Blood pressure under good control. Blood pressure is at goal of <130/80 mmHg per 2017 AHA/ACC guidelines. Current medications: ramipril 10 mg by mouth once daily Intolerances: none Taking medications as directed: yes Side effects thought to be attributed to current medication regimen: no Current home blood pressure: not discussed today Continue ramipril 10 mg by mouth once daily Encourage dietary sodium restriction/DASH diet Recommend home blood pressure monitoring to discuss at next visit  Hyperlipidemia - New goal.: Uncontrolled. LDL above goal of <100 due to high risk given at least 2 major CV risk factors (advancing age, elevated LDL cholesterol, and hypertension) and 10-year risk 10-20% per 2020 AACE/ACE guidelines. Triglycerides at goal of <150 per 2020 AACE/ACE guidelines. Current medications:  patient reports taking Dr. Juluis Rainier Plus Max. Previously tried rosuvastatin but was  unable to tolerate for unknown reason Taking medications as directed: n/a Side effects thought to be attributed to current medication regimen: n/a Encourage dietary reduction of high fat containing foods such as butter, nuts, bacon, egg yolks, etc. Will discuss patient care goals with primary care provider. May need to consider trial of another statin or ezetimibe to lower LDL to reduce cardiovascular risk.  Insomnia: Uncontrolled. Patient has tried trazodone, mirtazapine, temazepam, doxylamine, imipramine, and Belsomra) without much relief Current medications: imipramine 10 mg by mouth at bedtime Patient reports imipramine works sometimes Discussed ramelteon (melatonin agonist); however insurance does not cover. Recommend good sleep hygiene Keep sleep environment comfortable and conducive  to sleep Keep regular sleep schedule 7 nights a week Avoid naps during the day Avoid going to bed until drowsy and ready to sleep, not trying to sleep, and not watching the clock Get out of bed if not asleep within 15-20 minutes and return only when drowsy Avoid caffeine, nicotine, alcohol, and other substances that interfere with sleep before bedtime Exercise regularly (at least 6 hours before sleep)  Overactive Bladder: Follows with Dr. Alyson Ingles (Urologist) Uncontrolled per patient Current medications: Myrbetriq 25 mg by mouth daily Patient reports that she cannot tell a difference with Mrybetriq especially with high cost Recommended that patient discuss alternatives with urologist Discussed [place] patient assistance program. Offered give patient number to call for support but patient declines at this time.  Patient Goals/Self-Care Activities Patient will:  Take medications as prescribed Check blood pressure at least once daily, document, and provide at future appointments Collaborate with provider on medication access solutions Engage in dietary modifications by adequate fluid intake  (at least 6 cups of fluid per day)  Follow Up Plan: Face to Face appointment with care management team member scheduled for: 02/14/22      Medication Assistance: patient declines. Reports she can afford her medication she just not like that it costs so much. She feels as if her money would be spent better elsewhere.   Patient's preferred pharmacy is:  [facility] - [address], [place] [address] [place] [address] Phone: [phone] Fax: [place], [place] [address] [place] [address] Phone: [phone] Fax: [phone]  Follow Up:  Patient agrees to Care Plan and Follow-up.  Plan: Face to Face appointment with care management team member scheduled for: 02/14/22  Kennon Holter, PharmD, [place], CPP Clinical Pharmacist Practitioner [facility] [phone]

## 2022-01-31 NOTE — Patient Instructions (Signed)
Kelsey Nelson,  It was great to talk to you today!  Please call me with any questions or concerns.  Visit Information   Following is a copy of your full care plan:  Care Plan : Medication Management  Updates made by Beryle Lathe, [employer] since 01/31/2022 12:00 AM     Problem: HTN, HLD, Insomnia, Overactive Bladder   Priority: High  Onset Date: 01/31/2022     Long-Range Goal: Disease Progression Prevention   Start Date: 01/31/2022  Expected End Date: 05/01/2022  This Visit's Progress: On track  Priority: High  Note:   Current Barriers:  Unable to achieve control of hyperlipidemia, insomnia, and overactive bladder  Pharmacist Clinical Goal(s):  Through collaboration with PharmD and provider, patient will  Achieve control of hyperlipidemia, insomnia, and overactive bladder as evidenced by improved LDL, improved sleep, and reduced nocturia    Interventions: 1:1 collaboration with Fayrene Helper, MD regarding development and update of comprehensive plan of care as evidenced by provider attestation and co-signature Inter-disciplinary care team collaboration (see longitudinal plan of care) Comprehensive medication review performed; medication list updated in electronic medical record  Hypertension - New goal.: Blood pressure under good control. Blood pressure is at goal of <130/80 mmHg per 2017 AHA/ACC guidelines. Current medications: ramipril 10 mg by mouth once daily Intolerances: none Taking medications as directed: yes Side effects thought to be attributed to current medication regimen: no Current home blood pressure: not discussed today Continue ramipril 10 mg by mouth once daily Encourage dietary sodium restriction/DASH diet Recommend home blood pressure monitoring to discuss at next visit  Hyperlipidemia - New goal.: Uncontrolled. LDL above goal of <100 due to high risk given at least 2 major CV risk factors (advancing age, elevated LDL cholesterol, and  hypertension) and 10-year risk 10-20% per 2020 AACE/ACE guidelines. Triglycerides at goal of <150 per 2020 AACE/ACE guidelines. Current medications:  patient reports taking Dr. Juluis Rainier Plus Max. Previously tried rosuvastatin but was unable to tolerate for unknown reason Taking medications as directed: n/a Side effects thought to be attributed to current medication regimen: n/a Encourage dietary reduction of high fat containing foods such as butter, nuts, bacon, egg yolks, etc. Will discuss patient care goals with primary care provider. May need to consider trial of another statin or ezetimibe to lower LDL to reduce cardiovascular risk.  Insomnia: Uncontrolled. Patient has tried trazodone, mirtazapine, temazepam, doxylamine, imipramine, and Belsomra) without much relief Current medications: imipramine 10 mg by mouth at bedtime Patient reports imipramine works sometimes Discussed ramelteon (melatonin agonist); however insurance does not cover. Recommend good sleep hygiene Keep sleep environment comfortable and conducive to sleep Keep regular sleep schedule 7 nights a week Avoid naps during the day Avoid going to bed until drowsy and ready to sleep, not trying to sleep, and not watching the clock Get out of bed if not asleep within 15-20 minutes and return only when drowsy Avoid caffeine, nicotine, alcohol, and other substances that interfere with sleep before bedtime Exercise regularly (at least 6 hours before sleep)  Overactive Bladder: Follows with Dr. Alyson Ingles (Urologist) Uncontrolled per patient Current medications: Myrbetriq 25 mg by mouth daily Patient reports that she cannot tell a difference with Mrybetriq especially with high cost Recommended that patient discuss alternatives with urologist Discussed [place] patient assistance program. Offered give patient number to call for support but patient declines at this time.  Patient Goals/Self-Care Activities Patient  will:  Take medications as prescribed Check blood pressure at least once  daily, document, and provide at future appointments Collaborate with provider on medication access solutions Engage in dietary modifications by adequate fluid intake (at least 6 cups of fluid per day)  Follow Up Plan: Face to Face appointment with care management team member scheduled for: 02/14/22      Consent to CCM Services: [name] was given information about Chronic Care Management services including:  CCM service includes personalized support from designated clinical staff supervised by her physician, including individualized plan of care and coordination with other care providers 24/7 contact phone numbers for assistance for urgent and routine care needs. Service will only be billed when office clinical staff spend 20 minutes or more in a month to coordinate care. Only one practitioner may furnish and bill the service in a calendar month. The patient may stop CCM services at any time (effective at the end of the month) by phone call to the office staff. The patient will be responsible for cost sharing (co-pay) of up to 20% of the service fee (after annual deductible is met).  Patient agreed to services and verbal consent obtained.   Plan: Face to Face appointment with care management team member scheduled for: 02/14/22  The patient verbalized understanding of instructions, educational materials, and care plan provided today and agreed to receive a mailed copy of patient instructions, educational materials, and care plan.   Please call the care guide team at [phone] if you need to cancel or reschedule your appointment.   Kennon Holter, PharmD, [place], CPP Clinical Pharmacist Practitioner [facility] [phone]

## 2022-02-04 ENCOUNTER — Telehealth: Payer: Self-pay | Admitting: Family Medicine

## 2022-02-04 NOTE — Telephone Encounter (Signed)
Pls contact pt re treatment of her uTI, I learned  from [place] that the septra caused too much nausea for her to complete the course which is a part of the prob wh her infection is likley not adequately/ fully treated so she will have recurrence. Option #1 zofan 4 mg daily as needed for 5 days and take the septra Option #2 change to cipro if she can tolerate and take for 5 days PLS LET ME KNOW HER RESPONSE

## 2022-02-05 NOTE — Telephone Encounter (Signed)
She states she completed the septra rx (was having nausea but wasn't sure which med was causing it) but she states she finished the septra rx you gave her

## 2022-02-14 ENCOUNTER — Ambulatory Visit: Payer: Medicare PPO

## 2022-02-19 DIAGNOSIS — I10 Essential (primary) hypertension: Secondary | ICD-10-CM | POA: Diagnosis not present

## 2022-02-19 DIAGNOSIS — E785 Hyperlipidemia, unspecified: Secondary | ICD-10-CM

## 2022-03-08 ENCOUNTER — Other Ambulatory Visit: Payer: Self-pay | Admitting: Family Medicine

## 2022-03-21 ENCOUNTER — Encounter: Payer: Self-pay | Admitting: Emergency Medicine

## 2022-03-21 ENCOUNTER — Ambulatory Visit
Admission: EM | Admit: 2022-03-21 | Discharge: 2022-03-21 | Disposition: A | Discharge status: Home / Self Care | Payer: Medicare PPO | Attending: Family Medicine | Admitting: Family Medicine

## 2022-03-21 DIAGNOSIS — H0100A Unspecified blepharitis right eye, upper and lower eyelids: Secondary | ICD-10-CM

## 2022-03-21 MED ORDER — ERYTHROMYCIN 5 MG/GM OP OINT: TOPICAL_OINTMENT | OPHTHALMIC | 0 refills | Status: DC

## 2022-03-21 NOTE — ED Provider Notes (Signed)
?[place] ? ? ? ?CSN: [id number] ?Arrival date & time: 03/21/22  8182 ? ? ?  ? ?History   ?Chief Complaint ?Chief Complaint  ?Patient presents with  ? Eye Drainage  ? ? ?HPI ?Kelsey Nelson is a 88 y.o. female.  ? ?Presenting today with several day history of right upper and lower eyelid redness, swelling, crusting and drainage.  She states that she has a prosthetic eye on the side so the symptoms are not involving the eye itself but just the eyelids.  She denies injury to the area, symptoms in the other eye, headache, nausea, vomiting.  So far not trying anything over-the-counter for symptoms. ? ? ?Past Medical History:  ?Diagnosis Date  ? Acute cystitis with hematuria 10/26/2013  ? Allergic rhinitis 11/08/2017  ? Blindness of right eye   ? BMI between 19-24,adult 2010 145 LBS  ? Diverticula of small intestine Dec 2010  ? Diverticulosis   ? [place]  ? DIVERTICULOSIS, SIGMOID COLON 03/31/2008  ? Qualifier: Diagnosis of  By: Dierdre Harness  Noted on abdominal and pelvic scan in 04/2011.  Extensive colonic diverticulosis , and possible muscular hypertrophy at rectosigmoid junction   ? Family hx of colon cancer 11/09/2013  ? Functional constipation 2010  ? GERD (gastroesophageal reflux disease)   ? HTN (hypertension)   ? Hyperlipemia   ? Pancreatic cyst 02/05/2013  ? Renal cyst 05/27/2011  ? Right eye trauma 1949  ? Loss of vision resulted at age 38  ? Schatzki's ring Dec 2010  ? Skin lesions, generalized 06/19/2015  ? Tubular adenoma 11/09/2013  ? ? ?Patient Active Problem List  ? Diagnosis Date Noted  ? Skin lesions 01/22/2022  ? Abdominal spasms 01/22/2022  ? Blepharitis of left lower eyelid 10/28/2021  ? Recurrent UTI 05/10/2021  ? Skipped heart beats 10/19/2020  ? Dermatitis 07/31/2020  ? COVID-19 virus infection 02/25/2020  ? Anterior knee pain, right 10/05/2019  ? Belching symptom 10/05/2019  ? Injury of left knee 11/28/2017  ? Annual physical exam 03/23/2016  ? Ectropion of right eye 11/22/2015  ? Acquired  superior sulcus deformity of orbit 07/04/2015  ? History of evisceration of eye 07/04/2015  ? Nuclear sclerosis of left eye 07/04/2015  ? Urinary urgency 10/31/2013  ? Loss of taste 07/31/2012  ? Pancreatic cyst 04/21/2012  ? H/O gastroesophageal reflux (GERD) 04/21/2012  ? Nocturia 12/02/2011  ? Insomnia 09/20/2011  ? Nausea 06/22/2011  ? Constipation 06/21/2011  ? GERD 11/20/2009  ? Mixed hyperlipidemia 03/31/2008  ? BLINDNESS, RIGHT EYE 03/31/2008  ? HTN (hypertension) 03/31/2008  ? Osteoporosis 03/31/2008  ? ? ?Past Surgical History:  ?Procedure Laterality Date  ? ABDOMINAL HYSTERECTOMY    ? BREAST REDUCTION SURGERY  1992 BIL  ? COLONOSCOPY  DEC 2010  ? SIMPLE ADENOMAS (6-8), [place] TICS, SML IH  ? COLONOSCOPY N/A 12/08/2013  ? Procedure: COLONOSCOPY;  Surgeon: Rogene Houston, MD;  [facility]: [facility];  Service: Endoscopy;  Laterality: N/A;  225  ? EVISCERATION Right 05/08/2015  ? Procedure: EVISCERATION WITH IMPLANT RIGHT EYE;  Surgeon: Williams Che, MD;  Location: [facility];  Service: Ophthalmology;  Laterality: Right;  ? EYE SURGERY  RIGHT 2005  ? EYE SURGERY  right 2016  ? removed and false eye placed in 08/2015  ? TOTAL ABDOMINAL HYSTERECTOMY W/ BILATERAL SALPINGOOPHORECTOMY  1985 FIBROIDS  ? UPPER GASTROINTESTINAL ENDOSCOPY  DEC 2010  ? Bx-MILD GASTRITIS/DUODENITIS, DUODENAL DIVERTICULA, SCHATZKI'S RING  ? ? ?OB History   ? ?  Gravida  ?   ? Para  ?   ? Term  ?   ? Preterm  ?   ? AB  ?   ? Living  ?3  ?  ? ? SAB  ?   ? IAB  ?   ? Ectopic  ?   ? Multiple  ?   ? Live Births  ?   ?   ?  ?  ? ? ? ?Home Medications   ? ?Prior to Admission medications   ?Medication Sig Start Date End Date Taking? Authorizing Provider  ?erythromycin ophthalmic ointment Place a 1/2 inch ribbon of ointment into the right lower eyelid BID prn. 03/21/22  Yes Volney American, PA-C  ?B Complex Vitamins (VITAMIN-B COMPLEX PO) Take 1 tablet by mouth daily.    [provider]  ?Calcium Carbonate Antacid (TUMS PO) Take 1  tablet by mouth 3 (three) times daily as needed.    [provider]  ?Cholecalciferol (VITAMIN D3 PO) Take 25 mcg by mouth daily.    [provider]  ?conjugated estrogens (PREMARIN) vaginal cream Apply one fingertip to introitus every 2 days. ?Patient taking differently: Apply one fingertip to introitus every 2 days as needed 12/20/20   Perlie Mayo, NP  ?famotidine (PEPCID) 20 MG tablet Take 1 tablet (20 mg total) by mouth daily before supper. ?Patient not taking: Reported on 01/31/2022 03/13/21   Rogene Houston, MD  ?hyoscyamine (ANASPAZ) 0.125 MG TBDP disintergrating tablet Place one tablet under tongue twice daily, as needed, for abdominal spasm 01/22/22   Fayrene Helper, MD  ?imipramine (TOFRANIL) 10 MG tablet Take 1 tablet (10 mg total) by mouth at bedtime. 01/22/22   Fayrene Helper, MD  ?mirabegron ER (MYRBETRIQ) 25 MG TB24 tablet Take 1 tablet (25 mg total) by mouth daily. 01/22/22   Fayrene Helper, MD  ?OVER THE COUNTER MEDICATION Multi Collagen Protein - Patient states that she takes 1 scoop a day.    [provider]  ?Probiotic Product (PROBIOTIC DAILY PO) Take 1 tablet by mouth daily.    [provider]  ?ramipril (ALTACE) 10 MG capsule TAKE (1) CAPSULE BY MOUTH ONCE DAILY. 03/08/22   Fayrene Helper, MD  ?Simethicone (GAS-X PO) Take by mouth. Per Patient she takes a couple (2) a day. ?Patient not taking: Reported on 01/31/2022    [provider]  ?sulfamethoxazole-trimethoprim (BACTRIM DS) 800-160 MG tablet Take 1 tablet by mouth 2 (two) times daily. ?Patient not taking: Reported on 01/31/2022 01/28/22   Fayrene Helper, MD  ?vitamin C (ASCORBIC ACID) 500 MG tablet Take 500 mg by mouth daily.    [provider]  ?Zinc 50 MG TABS Take 1 tablet by mouth daily.    [provider]  ? ? ?Family History ?Family History  ?Problem Relation Age of Onset  ? Pancreatic cancer Sister   ? Colon cancer Paternal Aunt   ? Colon polyps Neg Hx    ? ? ?Social History ?Social History  ? ?Tobacco Use  ? Smoking status: Never  ? Smokeless tobacco: Never  ?Vaping Use  ? Vaping Use: Never used  ?Substance Use Topics  ? Alcohol use: No  ?  Alcohol/week: 0.0 standard drinks  ? Drug use: No  ? ? ? ?Allergies   ?Codeine, Crestor [rosuvastatin], and Fosamax [alendronate sodium] ? ? ?Review of Systems ?Review of Systems ?Per HPI ? ?Physical Exam ?Triage Vital Signs ?ED Triage Vitals  ?Enc Vitals Group  ?  BP 03/21/22 0854 (!) 150/77  ?   Pulse Rate 03/21/22 0854 87  ?   Resp 03/21/22 0854 18  ?   Temp 03/21/22 0854 97.6 ?F (36.4 ?C)  ?   Temp Source 03/21/22 0854 Oral  ?   SpO2 03/21/22 0854 94 %  ?   Weight 03/21/22 0855 130 lb (59 kg)  ?   Height 03/21/22 0855 5' 5.5" (1.664 m)  ?   Head Circumference --   ?   Peak Flow --   ?   Pain Score 03/21/22 0854 0  ?   Pain Loc --   ?   Pain Edu? --   ?   Excl. in [place]? --   ? ?No data found. ? ?Updated Vital Signs ?BP (!) 150/77 (BP Location: Right Arm)   Pulse 87   Temp 97.6 ?F (36.4 ?C) (Oral)   Resp 18   Ht 5' 5.5" (1.664 m)   Wt 130 lb (59 kg)   SpO2 94%   BMI 21.30 kg/m?  ? ?Visual Acuity ?Right Eye Distance:   ?Left Eye Distance:   ?Bilateral Distance:   ? ?Right Eye Near:   ?Left Eye Near:    ?Bilateral Near:    ? ?Physical Exam ?Vitals and nursing note reviewed.  ?Constitutional:   ?   Appearance: Normal appearance. She is not ill-appearing.  ?HENT:  ?   Head: Atraumatic.  ?   Nose: Nose normal.  ?   Mouth/Throat:  ?   Mouth: Mucous membranes are moist.  ?   Pharynx: Oropharynx is clear.  ?Eyes:  ?   Comments: Right eye prosthesis, bilateral upper and lower eyelids erythematous, edematous with thick crusted drainage  ?Cardiovascular:  ?   Rate and Rhythm: Normal rate and regular rhythm.  ?   Heart sounds: Normal heart sounds.  ?Pulmonary:  ?   Effort: Pulmonary effort is normal.  ?   Breath sounds: Normal breath sounds.  ?Musculoskeletal:     ?   General: Normal range of motion.  ?   Cervical back: Normal  range of motion and neck supple.  ?Skin: ?   General: Skin is warm and dry.  ?Neurological:  ?   Mental Status: She is alert and oriented to person, place, and time.  ?Psychiatric:     ?   Mood and Affect: Mood norma

## 2022-03-21 NOTE — ED Triage Notes (Signed)
Pt reports right eye prosthesis and reports eye drainage and redness for last several days. Pt denies any known injury or foreign body.  ?

## 2022-03-29 ENCOUNTER — Ambulatory Visit (INDEPENDENT_AMBULATORY_CARE_PROVIDER_SITE_OTHER): Payer: Medicare PPO | Admitting: Urology

## 2022-03-29 VITALS — BP 118/73 | HR 79

## 2022-03-29 DIAGNOSIS — N3001 Acute cystitis with hematuria: Secondary | ICD-10-CM

## 2022-03-29 DIAGNOSIS — N3281 Overactive bladder: Secondary | ICD-10-CM | POA: Diagnosis not present

## 2022-03-29 DIAGNOSIS — R351 Nocturia: Secondary | ICD-10-CM | POA: Diagnosis not present

## 2022-03-29 LAB — URINALYSIS, ROUTINE W REFLEX MICROSCOPIC
Bilirubin, UA: NEGATIVE
Glucose, UA: NEGATIVE
Ketones, UA: NEGATIVE
Nitrite, UA: NEGATIVE
Protein,UA: NEGATIVE
Specific Gravity, UA: 1.02 (ref 1.005–1.030)
Urobilinogen, Ur: 0.2 mg/dL (ref 0.2–1.0)
pH, UA: 6.5 (ref 5.0–7.5)

## 2022-03-29 LAB — MICROSCOPIC EXAMINATION: Renal Epithel, UA: NONE SEEN /hpf

## 2022-03-29 LAB — BLADDER SCAN AMB NON-IMAGING: Scan Result: 89

## 2022-03-29 MED ORDER — CEFUROXIME AXETIL 250 MG PO TABS: 250.0000 mg | ORAL_TABLET | Freq: Two times a day (BID) | ORAL | 0 refills | Status: DC

## 2022-03-29 NOTE — Progress Notes (Signed)
? ?03/29/2022 ?11:03 AM  ? ?[place] ?Nov 13, 1934 ?[id number] ? ?Referring provider: Fayrene Helper, MD ?[address], [address] ?[place],  [place] [address] ? ?Followup UTI and OAB ? ? ?HPI: ?Kelsey Nelson is 88yo here for followup for recurrent UTI and OAB. UA today is concerning for infection. She had a positive urine culture 1/31 which was not treated.  She has dysuria, urinary frequency, urgency and worsening incontinence. No hematuria. No other complaints today ? ? ?PMH: ?Past Medical History:  ?Diagnosis Date  ? Acute cystitis with hematuria 10/26/2013  ? Allergic rhinitis 11/08/2017  ? Blindness of right eye   ? BMI between 19-24,adult 2010 145 LBS  ? Diverticula of small intestine Dec 2010  ? Diverticulosis   ? [place]  ? DIVERTICULOSIS, SIGMOID COLON 03/31/2008  ? Qualifier: Diagnosis of  By: Dierdre Harness  Noted on abdominal and pelvic scan in 04/2011.  Extensive colonic diverticulosis , and possible muscular hypertrophy at rectosigmoid junction   ? Family hx of colon cancer 11/09/2013  ? Functional constipation 2010  ? GERD (gastroesophageal reflux disease)   ? HTN (hypertension)   ? Hyperlipemia   ? Pancreatic cyst 02/05/2013  ? Renal cyst 05/27/2011  ? Right eye trauma 1949  ? Loss of vision resulted at age 84  ? Schatzki's ring Dec 2010  ? Skin lesions, generalized 06/19/2015  ? Tubular adenoma 11/09/2013  ? ? ?Surgical History: ?Past Surgical History:  ?Procedure Laterality Date  ? ABDOMINAL HYSTERECTOMY    ? BREAST REDUCTION SURGERY  1992 BIL  ? COLONOSCOPY  DEC 2010  ? SIMPLE ADENOMAS (6-8), [place] TICS, SML IH  ? COLONOSCOPY N/A 12/08/2013  ? Procedure: COLONOSCOPY;  Surgeon: Rogene Houston, MD;  [facility];  Service: Endoscopy;  Laterality: N/A;  225  ? EVISCERATION Right 05/08/2015  ? Procedure: EVISCERATION WITH IMPLANT RIGHT EYE;  Surgeon: Williams Che, MD;  Location: [facility];  Service: Ophthalmology;  Laterality: Right;  ? EYE SURGERY  RIGHT 2005  ? EYE SURGERY  right 2016  ? removed and  false eye placed in 08/2015  ? TOTAL ABDOMINAL HYSTERECTOMY W/ BILATERAL SALPINGOOPHORECTOMY  1985 FIBROIDS  ? UPPER GASTROINTESTINAL ENDOSCOPY  DEC 2010  ? Bx-MILD GASTRITIS/DUODENITIS, DUODENAL DIVERTICULA, SCHATZKI'S RING  ? ? ?Home Medications:  ?Allergies as of 03/29/2022   ? ?   Reactions  ? Codeine   ? Crestor [rosuvastatin] Other (See Comments)  ? Can't recall  ? Fosamax [alendronate Sodium]   ? heartburn  ? ?  ? ?  ?Medication List  ?  ? ?  ? Accurate as of March 29, 2022 11:03 AM. If you have any questions, ask your nurse or doctor.  ?  ?  ? ?  ? ?conjugated estrogens 0.625 MG/GM vaginal cream ?Commonly known as: PREMARIN ?Apply one fingertip to introitus every 2 days. ?What changed: additional instructions ?  ?erythromycin ophthalmic ointment ?Place a 1/2 inch ribbon of ointment into the right lower eyelid BID prn. ?  ?famotidine 20 MG tablet ?Commonly known as: Pepcid ?Take 1 tablet (20 mg total) by mouth daily before supper. ?  ?GAS-X PO ?Take by mouth. Per Patient she takes a couple (2) a day. ?  ?hyoscyamine 0.125 MG Tbdp disintergrating tablet ?Commonly known as: ANASPAZ ?Place one tablet under tongue twice daily, as needed, for abdominal spasm ?  ?imipramine 10 MG tablet ?Commonly known as: TOFRANIL ?Take 1 tablet (10 mg total) by mouth at bedtime. ?  ?mirabegron ER 25  MG Tb24 tablet ?Commonly known as: MYRBETRIQ ?Take 1 tablet (25 mg total) by mouth daily. ?  ?OVER THE COUNTER MEDICATION ?Multi Collagen Protein - Patient states that she takes 1 scoop a day. ?  ?PROBIOTIC DAILY PO ?Take 1 tablet by mouth daily. ?  ?ramipril 10 MG capsule ?Commonly known as: ALTACE ?TAKE (1) CAPSULE BY MOUTH ONCE DAILY. ?  ?sulfamethoxazole-trimethoprim 800-160 MG tablet ?Commonly known as: BACTRIM DS ?Take 1 tablet by mouth 2 (two) times daily. ?  ?TUMS PO ?Take 1 tablet by mouth 3 (three) times daily as needed. ?  ?vitamin C 500 MG tablet ?Commonly known as: ASCORBIC ACID ?Take 500 mg by mouth daily. ?  ?VITAMIN D3  PO ?Take 25 mcg by mouth daily. ?  ?VITAMIN-B COMPLEX PO ?Take 1 tablet by mouth daily. ?  ?Zinc 50 MG Tabs ?Take 1 tablet by mouth daily. ?  ? ?  ? ? ?Allergies:  ?Allergies  ?Allergen Reactions  ? Codeine   ? Crestor [Rosuvastatin] Other (See Comments)  ?  Can't recall  ? Fosamax [Alendronate Sodium]   ?  heartburn  ? ? ?Family History: ?Family History  ?Problem Relation Age of Onset  ? Pancreatic cancer Sister   ? Colon cancer Paternal Aunt   ? Colon polyps Neg Hx   ? ? ?Social History:  reports that she has never smoked. She has never used smokeless tobacco. She reports that she does not drink alcohol and does not use drugs. ? ?ROS: ?All other review of systems were reviewed and are negative except what is noted above in HPI ? ?Physical Exam: ?BP 118/73   Pulse 79   ?Constitutional:  Alert and oriented, No acute distress. ?HEENT: [place] AT, moist mucus membranes.  Trachea midline, no masses. ?Cardiovascular: No clubbing, cyanosis, or edema. ?Respiratory: Normal respiratory effort, no increased work of breathing. ?GI: Abdomen is soft, nontender, nondistended, no abdominal masses ?GU: No CVA tenderness.  ?Lymph: No cervical or inguinal lymphadenopathy. ?Skin: No rashes, bruises or suspicious lesions. ?Neurologic: Grossly intact, no focal deficits, moving all 4 extremities. ?Psychiatric: Normal mood and affect. ? ?Laboratory Data: ?Lab Results  ?Component Value Date  ? WBC 5.9 07/27/2021  ? HGB 13.8 07/27/2021  ? HCT 41.4 07/27/2021  ? MCV 90 07/27/2021  ? PLT 253 07/27/2021  ? ? ?Lab Results  ?Component Value Date  ? CREATININE 0.86 01/15/2022  ? ? ?No results found for: PSA ? ?No results found for: TESTOSTERONE ? ?Lab Results  ?Component Value Date  ? HGBA1C 5.6 09/03/2016  ? ? ?Urinalysis ?   ?Component Value Date/Time  ? COLORURINE STRAW (A) 12/04/2018 1058  ? APPEARANCEUR Clear 12/26/2021 1149  ? LABSPEC 1.003 (L) 12/04/2018 1058  ? PHURINE 7.0 12/04/2018 1058  ? GLUCOSEU Negative 12/26/2021 1149  ? HGBUR SMALL  (A) 12/04/2018 1058  ? BILIRUBINUR negative 01/22/2022 1316  ? BILIRUBINUR Negative 12/26/2021 1149  ? KETONESUR negative 01/22/2022 1316  ? [place] NEGATIVE 12/04/2018 1058  ? PROTEINUR Negative 12/26/2021 1149  ? PROTEINUR NEGATIVE 12/04/2018 1058  ? UROBILINOGEN 0.2 01/22/2022 1316  ? UROBILINOGEN 0.2 07/14/2009 2018  ? NITRITE Positive (A) 01/22/2022 1316  ? NITRITE Positive (A) 12/26/2021 1149  ? NITRITE NEGATIVE 12/04/2018 1058  ? LEUKOCYTESUR Small (1+) (A) 01/22/2022 1316  ? LEUKOCYTESUR 2+ (A) 12/26/2021 1149  ? ? ?Lab Results  ?Component Value Date  ? LABMICR See below: 12/26/2021  ? WBCUA 6-10 (A) 12/26/2021  ? LABEPIT 0-10 12/26/2021  ? BACTERIA Many (A) 12/26/2021  ? ? ?  Pertinent Imaging: ? ?Results for orders placed during the hospital encounter of 07/31/12 ? ?DG Abd 1 View ? ?Narrative ?*RADIOLOGY REPORT* ? ?Clinical Data: History of constipation.  Abdominal pain. ? ?ABDOMEN - 1 VIEW ? ?Comparison: 06/21/2011 ? ?Findings: Moderate stool burden within the colon, slightly ?decreased in the left colon since prior study.  No evidence of ?bowel obstruction.  No free air.  No organomegaly or acute bony ?abnormality.  Calcified phleboliths in the anatomic pelvis. ? ?IMPRESSION: ?Moderate stool burden, decreased from prior study.  No acute ?findings. ? ?Original Report Authenticated By: Raelyn Number, M.D. ? ?No results found for this or any previous visit. ? ?No results found for this or any previous visit. ? ?No results found for this or any previous visit. ? ?No results found for this or any previous visit. ? ?No results found for this or any previous visit. ? ?No results found for this or any previous visit. ? ?No results found for this or any previous visit. ? ? ?Assessment & Plan:   ? ?1. OAB (overactive bladder) ?-her worsening OAB symtpoms are likely related to an untreated UTI. We will reassess symtpoms after she has completed her course of antibiotics ?- BLADDER SCAN AMB NON-IMAGING ? ?2. Acute  cystitis with hematuria ?-Urine for culture ?-ceftin '250mg'$  BID for 7 days ?-RTC 1 week for UA drop off to ensure resolution of UTI. We will then start trimethoprim '100mg'$  prophylaxis. ?- Urinalysis, Routine w

## 2022-03-29 NOTE — Progress Notes (Signed)
post void residual= 89 ?

## 2022-04-02 LAB — URINE CULTURE

## 2022-04-02 NOTE — Progress Notes (Signed)
Called patient several times with no answer, left voicemail for patient to call back . ?

## 2022-04-03 ENCOUNTER — Telehealth: Payer: Self-pay

## 2022-04-03 ENCOUNTER — Ambulatory Visit: Payer: Medicare PPO | Admitting: Family Medicine

## 2022-04-03 NOTE — Telephone Encounter (Signed)
Patient was called. No answer. Left message to return call to office.  ?

## 2022-04-03 NOTE — Telephone Encounter (Signed)
-----   Message from Reynaldo Minium, [place] sent at 04/02/2022  1:17 PM EDT ----- ?Please let the patient know that Ceftin covers her positive urine culture ?----- Message ----- ?From: Dorisann Frames, RN ?Sent: 04/02/2022  10:36 AM EDT ?To: Cleon Gustin, MD, # ? ?Patient started on ceftin ? ?

## 2022-04-04 ENCOUNTER — Other Ambulatory Visit: Payer: Self-pay | Admitting: Physician Assistant

## 2022-04-04 ENCOUNTER — Telehealth: Payer: Self-pay

## 2022-04-04 ENCOUNTER — Other Ambulatory Visit: Payer: Medicare PPO

## 2022-04-04 DIAGNOSIS — N3081 Other cystitis with hematuria: Secondary | ICD-10-CM | POA: Diagnosis not present

## 2022-04-04 DIAGNOSIS — N3001 Acute cystitis with hematuria: Secondary | ICD-10-CM

## 2022-04-04 LAB — URINALYSIS, ROUTINE W REFLEX MICROSCOPIC
Bilirubin, UA: NEGATIVE
Glucose, UA: NEGATIVE
Ketones, UA: NEGATIVE
Nitrite, UA: NEGATIVE
Protein,UA: NEGATIVE
Specific Gravity, UA: 1.02 (ref 1.005–1.030)
Urobilinogen, Ur: 0.2 mg/dL (ref 0.2–1.0)
pH, UA: 5.5 (ref 5.0–7.5)

## 2022-04-04 LAB — MICROSCOPIC EXAMINATION

## 2022-04-04 MED ORDER — DOXYCYCLINE HYCLATE 100 MG PO CAPS: 100.0000 mg | ORAL_CAPSULE | Freq: Two times a day (BID) | ORAL | 0 refills | Status: DC

## 2022-04-04 NOTE — Progress Notes (Signed)
Return for urine drop-off to ensure recent infection had cleared.  UA indicates 11-30 WBCs, 3-10 RBCs, few bacteria, nitrite negative.  We will send her urine for MDX culture and, based on recent culture results, will initiate meant with doxycycline 100 twice daily for 7 days. ?

## 2022-04-04 NOTE — Telephone Encounter (Signed)
-----   Message from Reynaldo Minium, [place] sent at 04/04/2022  4:24 PM EDT ----- ?Please let the patient know that her urine indicates she still has infection.  I have sent in a new prescription for doxycycline and we will let her know what the culture results show on Monday.  Make sure she understands that doxycycline can make her photosensitive so she needs to avoid the sun and use sunscreen if she is outdoors. ?----- Message ----- ?From: Iris Pert, LPN ?Sent: 04/04/2022   4:19 PM EDT ?To: Berneice Heinrich [place], PA-C ? ?Per last OV note per Dr. Alyson Ingles pt was started on ceftin '250mg'$  BID for 7 days. ?-RTC 1 week for UA drop off to ensure resolution of UTI. She  will then start trimethoprim '100mg'$  prophylaxis ? ?

## 2022-04-04 NOTE — Telephone Encounter (Signed)
Patient came by office today and provided urine sample for MDX culture  ?

## 2022-04-04 NOTE — Telephone Encounter (Signed)
Patient called and made aware.

## 2022-04-04 NOTE — Progress Notes (Unsigned)
Fed ex pick for MDX urine culture ?[id number] ?

## 2022-04-08 ENCOUNTER — Other Ambulatory Visit: Payer: Self-pay | Admitting: Physician Assistant

## 2022-04-09 ENCOUNTER — Ambulatory Visit (INDEPENDENT_AMBULATORY_CARE_PROVIDER_SITE_OTHER): Payer: Medicare PPO | Admitting: Family Medicine

## 2022-04-09 ENCOUNTER — Telehealth: Payer: Self-pay

## 2022-04-09 ENCOUNTER — Encounter: Payer: Self-pay | Admitting: Urology

## 2022-04-09 ENCOUNTER — Encounter: Payer: Self-pay | Admitting: Family Medicine

## 2022-04-09 VITALS — BP 111/67 | HR 80 | Resp 16 | Ht 65.0 in | Wt 129.8 lb

## 2022-04-09 DIAGNOSIS — Z1231 Encounter for screening mammogram for malignant neoplasm of breast: Secondary | ICD-10-CM

## 2022-04-09 DIAGNOSIS — I10 Essential (primary) hypertension: Secondary | ICD-10-CM | POA: Diagnosis not present

## 2022-04-09 DIAGNOSIS — F5104 Psychophysiologic insomnia: Secondary | ICD-10-CM | POA: Diagnosis not present

## 2022-04-09 DIAGNOSIS — E559 Vitamin D deficiency, unspecified: Secondary | ICD-10-CM | POA: Diagnosis not present

## 2022-04-09 DIAGNOSIS — E782 Mixed hyperlipidemia: Secondary | ICD-10-CM | POA: Diagnosis not present

## 2022-04-09 DIAGNOSIS — E785 Hyperlipidemia, unspecified: Secondary | ICD-10-CM | POA: Diagnosis not present

## 2022-04-09 MED ORDER — FOSFOMYCIN TROMETHAMINE 3 G PO PACK
3.0000 g | PACK | ORAL | 0 refills | Status: AC
Start: 2022-04-09 — End: 2022-04-12

## 2022-04-09 NOTE — Telephone Encounter (Signed)
MDX urine culture reviewed with Dr. Alyson Ingles. ? ?Patient instructed to stop doxycycline and fosfomycin called in to [facility] per Dr. Alyson Ingles for 2 doses every other day. ? ?Patient voiced understanding- came by office and reviewed with patient.  ?

## 2022-04-09 NOTE — Telephone Encounter (Signed)
-----   Message from Cleon Gustin, MD sent at 04/09/2022 10:14 AM EDT ----- ?Continue ceftin ?----- Message ----- ?From: Dorisann Frames, RN ?Sent: 04/02/2022  10:36 AM EDT ?To: Cleon Gustin, MD, # ? ?Patient started on ceftin ? ?

## 2022-04-09 NOTE — Progress Notes (Signed)
? ?  Kelsey Nelson     MRN: [id number]      DOB: Dec 31, 1933 ? ? ?HPI ?Kelsey Nelson is here for follow up and re-evaluation of chronic medical conditions, medication management and review of any available recent lab and radiology data.  ?Preventive health is updated, specifically  Cancer screening and Immunization.   ?Questions or concerns regarding consultations or procedures which the PT has had in the interim are  addressed. ?The PT denies any adverse reactions to current medications since the last visit.  ?There are no new concerns.  ?There are no specific complaints  ? ?ROS ?Denies recent fever or chills. ?Denies sinus pressure, nasal congestion, ear pain or sore throat. ?Denies chest congestion, productive cough or wheezing. ?Denies chest pains, palpitations and leg swelling ?Denies abdominal pain, nausea, vomiting,diarrhea or constipation.   ?Denies dysuria, frequency, hesitancy or incontinence. ?Denies joint pain, swelling and limitation in mobility. ?Denies headaches, seizures, numbness, or tingling. ?Denies depression, anxiety or insomnia. ?Denies skin break down or rash. ? ? ?PE ? ?BP 111/67   Pulse 80   Resp 16   Ht '5\' 5"'$  (1.651 m)   Wt 129 lb 12.8 oz (58.9 kg)   SpO2 94%   BMI 21.60 kg/m?  ? ?Patient alert and oriented and in no cardiopulmonary distress. ? ?HEENT: No facial asymmetry, EOMI,     Neck supple . ? ?Chest: Clear to auscultation bilaterally. ? ?CVS: S1, S2 no murmurs, no S3.Regular rate. ? ?ABD: Soft non tender.  ? ?Ext: No edema ? ?MS: Adequate ROM spine, shoulders, hips and knees. ? ?Skin: Intact, no ulcerations or rash noted. ? ?Psych: Good eye contact, normal affect. Memory intact not anxious or depressed appearing. ? ?CNS: CN 2-12 intact, power,  normal throughout.no focal deficits noted. ? ? ?Assessment & Plan ?HTN (hypertension) ?Controlled, no change in medication ?DASH diet and commitment to daily physical activity for a minimum of 30 minutes discussed and encouraged, as a part of  hypertension management. ?The importance of attaining a healthy weight is also discussed. ? ? ?  04/09/2022  ?  2:00 PM 03/29/2022  ? 10:28 AM 03/21/2022  ?  8:55 AM 03/21/2022  ?  8:54 AM 01/31/2022  ?  1:09 PM 01/22/2022  ?  9:12 AM 12/26/2021  ? 10:42 AM  ?BP/Weight  ?Systolic BP 622 297  989 211 138 164  ?Diastolic BP 67 73  77 66 81 83  ?Wt. (Lbs) 129.8  130   128.4   ?BMI 21.6 kg/m2  21.3 kg/m2   20.72 kg/m2   ? ? ? ? ? ?Mixed hyperlipidemia ?Hyperlipidemia:Low fat diet discussed and encouraged. ? ? ?Lipid Panel  ?Lab Results  ?Component Value Date  ? CHOL 279 (H) 01/15/2022  ? HDL 87 01/15/2022  ? LDLCALC 167 (H) 01/15/2022  ? TRIG 143 01/15/2022  ? CHOLHDL 3.2 01/15/2022  ? ? ? ?Needs to reduce fat intake ?Updated lab needed at/ before next visit. ? ? ?Insomnia ?Unchanged  ?Sleep hygiene reviewed ? ?

## 2022-04-09 NOTE — Telephone Encounter (Signed)
See other telephone encounter.

## 2022-04-09 NOTE — Telephone Encounter (Signed)
I called patient. No answer. Left message to return call to office.  ?

## 2022-04-09 NOTE — Patient Instructions (Signed)
F/U early September, call if you need me before ? ?Excellent Blood pressure ? ?Please schedule mammogram at checkout ? ?Fasting cBC, lipid, cmp and eGFR, TSH and vit D 1 week before Sept visit ? ?If you start developing UTI symptoms pls come in for appt as soon as possible ? ?Small amounts of cranberry juice and a lot of water DAILY MAY HELP TO  REDUCE RECURRENCE OF BLADDER INFECTION ? ?Thanks for choosing [facility], we consider it a privelige to serve you. ? ?

## 2022-04-09 NOTE — Patient Instructions (Signed)
Urinary Tract Infection, Adult A urinary tract infection (UTI) is an infection of any part of the urinary tract. The urinary tract includes: The kidneys. The ureters. The bladder. The urethra. These organs make, store, and get rid of pee (urine) in the body. What are the causes? This infection is caused by germs (bacteria) in your genital area. These germs grow and cause swelling (inflammation) of your urinary tract. What increases the risk? The following factors may make you more likely to develop this condition: Using a small, thin tube (catheter) to drain pee. Not being able to control when you pee or poop (incontinence). Being female. If you are female, these things can increase the risk: Using these methods to prevent pregnancy: A medicine that kills sperm (spermicide). A device that blocks sperm (diaphragm). Having low levels of a female hormone (estrogen). Being pregnant. You are more likely to develop this condition if: You have genes that add to your risk. You are sexually active. You take antibiotic medicines. You have trouble peeing because of: A prostate that is bigger than normal, if you are female. A blockage in the part of your body that drains pee from the bladder. A kidney stone. A nerve condition that affects your bladder. Not getting enough to drink. Not peeing often enough. You have other conditions, such as: Diabetes. A weak disease-fighting system (immune system). Sickle cell disease. Gout. Injury of the spine. What are the signs or symptoms? Symptoms of this condition include: Needing to pee right away. Peeing small amounts often. Pain or burning when peeing. Blood in the pee. Pee that smells bad or not like normal. Trouble peeing. Pee that is cloudy. Fluid coming from the vagina, if you are female. Pain in the belly or lower back. Other symptoms include: Vomiting. Not feeling hungry. Feeling mixed up (confused). This may be the first symptom in  older adults. Being tired and grouchy (irritable). A fever. Watery poop (diarrhea). How is this treated? Taking antibiotic medicine. Taking other medicines. Drinking enough water. In some cases, you may need to see a specialist. Follow these instructions at home:  Medicines Take over-the-counter and prescription medicines only as told by your doctor. If you were prescribed an antibiotic medicine, take it as told by your doctor. Do not stop taking it even if you start to feel better. General instructions Make sure you: Pee until your bladder is empty. Do not hold pee for a long time. Empty your bladder after sex. Wipe from front to back after peeing or pooping if you are a female. Use each tissue one time when you wipe. Drink enough fluid to keep your pee pale yellow. Keep all follow-up visits. Contact a doctor if: You do not get better after 1-2 days. Your symptoms go away and then come back. Get help right away if: You have very bad back pain. You have very bad pain in your lower belly. You have a fever. You have chills. You feeling like you will vomit or you vomit. Summary A urinary tract infection (UTI) is an infection of any part of the urinary tract. This condition is caused by germs in your genital area. There are many risk factors for a UTI. Treatment includes antibiotic medicines. Drink enough fluid to keep your pee pale yellow. This information is not intended to replace advice given to you by your health care provider. Make sure you discuss any questions you have with your health care provider. Document Revised: 07/21/2020 Document Reviewed: 07/21/2020 Elsevier Patient Education    2023 Elsevier Inc.  

## 2022-04-14 ENCOUNTER — Encounter: Payer: Self-pay | Admitting: Family Medicine

## 2022-04-14 NOTE — Assessment & Plan Note (Signed)
Unchanged  ?Sleep hygiene reviewed ?

## 2022-04-14 NOTE — Assessment & Plan Note (Signed)
Controlled, no change in medication ?DASH diet and commitment to daily physical activity for a minimum of 30 minutes discussed and encouraged, as a part of hypertension management. ?The importance of attaining a healthy weight is also discussed. ? ? ?  04/09/2022  ?  2:00 PM 03/29/2022  ? 10:28 AM 03/21/2022  ?  8:55 AM 03/21/2022  ?  8:54 AM 01/31/2022  ?  1:09 PM 01/22/2022  ?  9:12 AM 12/26/2021  ? 10:42 AM  ?BP/Weight  ?Systolic BP 847 207  218 288 138 164  ?Diastolic BP 67 73  77 66 81 83  ?Wt. (Lbs) 129.8  130   128.4   ?BMI 21.6 kg/m2  21.3 kg/m2   20.72 kg/m2   ? ? ? ? ?

## 2022-04-14 NOTE — Assessment & Plan Note (Signed)
Hyperlipidemia:Low fat diet discussed and encouraged. ? ? ?Lipid Panel  ?Lab Results  ?Component Value Date  ? CHOL 279 (H) 01/15/2022  ? HDL 87 01/15/2022  ? LDLCALC 167 (H) 01/15/2022  ? TRIG 143 01/15/2022  ? CHOLHDL 3.2 01/15/2022  ? ? ? ?Needs to reduce fat intake ?Updated lab needed at/ before next visit. ? ?

## 2022-04-22 ENCOUNTER — Ambulatory Visit (HOSPITAL_COMMUNITY)
Admission: RE | Admit: 2022-04-22 | Discharge: 2022-04-22 | Disposition: A | Discharge status: Home / Self Care | Payer: Medicare PPO | Source: Ambulatory Visit | Attending: Family Medicine | Admitting: Family Medicine

## 2022-04-22 DIAGNOSIS — Z1231 Encounter for screening mammogram for malignant neoplasm of breast: Secondary | ICD-10-CM | POA: Diagnosis not present

## 2022-06-03 ENCOUNTER — Ambulatory Visit (INDEPENDENT_AMBULATORY_CARE_PROVIDER_SITE_OTHER): Payer: Medicare PPO | Admitting: Family Medicine

## 2022-06-03 ENCOUNTER — Encounter: Payer: Self-pay | Admitting: Family Medicine

## 2022-06-03 VITALS — BP 114/68 | HR 74 | Ht 65.0 in | Wt 129.8 lb

## 2022-06-03 DIAGNOSIS — J329 Chronic sinusitis, unspecified: Secondary | ICD-10-CM | POA: Insufficient documentation

## 2022-06-03 DIAGNOSIS — R059 Cough, unspecified: Secondary | ICD-10-CM

## 2022-06-03 DIAGNOSIS — J0110 Acute frontal sinusitis, unspecified: Secondary | ICD-10-CM | POA: Diagnosis not present

## 2022-06-03 DIAGNOSIS — J0100 Acute maxillary sinusitis, unspecified: Secondary | ICD-10-CM | POA: Diagnosis not present

## 2022-06-03 MED ORDER — AMOXICILLIN-POT CLAVULANATE 875-125 MG PO TABS: 1.0000 | ORAL_TABLET | Freq: Two times a day (BID) | ORAL | 0 refills | Status: DC

## 2022-06-03 MED ORDER — FLUTICASONE PROPIONATE 50 MCG/ACT NA SUSP: 2.0000 | Freq: Every day | NASAL | 6 refills | Status: DC

## 2022-06-03 MED ORDER — BENZONATATE 100 MG PO CAPS: 100.0000 mg | ORAL_CAPSULE | Freq: Two times a day (BID) | ORAL | 0 refills | Status: DC | PRN

## 2022-06-03 NOTE — Progress Notes (Signed)
Established Patient Office Visit  Subjective:  Patient ID: Kelsey Nelson, female    DOB: 1934-07-14  Age: 87 y.o. MRN: [id number]  CC:  Chief Complaint  Patient presents with   Cough    Pt complains of head congestion with cough onset of sx began on 05/29/22.     HPI Kelsey Nelson is a 88 y.o. female with past medical history of HTN presents with c/o of cough, congestion with yellow phlegm for 1 week. She reports recent sick contact with her husband, who had pneumonia. She denies fever and chills. She has used OTC decongestion, nyquil, with no relief. She reports using flonase.    Past Medical History:  Diagnosis Date   Acute cystitis with hematuria 10/26/2013   Allergic rhinitis 11/08/2017   Blindness of right eye    BMI between 19-24,adult 2010 145 LBS   Diverticula of small intestine Dec 2010   Diverticulosis    [place]   DIVERTICULOSIS, SIGMOID COLON 03/31/2008   Qualifier: Diagnosis of  By: Dierdre Harness  Noted on abdominal and pelvic scan in 04/2011.  Extensive colonic diverticulosis , and possible muscular hypertrophy at rectosigmoid junction    Family hx of colon cancer 11/09/2013   Functional constipation 2010   GERD (gastroesophageal reflux disease)    HTN (hypertension)    Hyperlipemia    Pancreatic cyst 02/05/2013   Renal cyst 05/27/2011   Right eye trauma 1949   Loss of vision resulted at age 74   Schatzki's ring Dec 2010   Skin lesions, generalized 06/19/2015   Tubular adenoma 11/09/2013    Past Surgical History:  Procedure Laterality Date   ABDOMINAL HYSTERECTOMY     BREAST REDUCTION SURGERY  1992 BIL   COLONOSCOPY  DEC 2010   SIMPLE ADENOMAS (6-8), [place] TICS, SML IH   COLONOSCOPY N/A 12/08/2013   Procedure: COLONOSCOPY;  Surgeon: Rogene Houston, MD;  Location: [facility];  Service: Endoscopy;  Laterality: N/A;  225   EVISCERATION Right 05/08/2015   Procedure: EVISCERATION WITH IMPLANT RIGHT EYE;  Surgeon: Williams Che, MD;  Location: [facility];  Service:  Ophthalmology;  Laterality: Right;   EYE SURGERY  RIGHT 2005   EYE SURGERY  right 2016   removed and false eye placed in 08/2015   TOTAL ABDOMINAL HYSTERECTOMY W/ BILATERAL SALPINGOOPHORECTOMY  1985 FIBROIDS   UPPER GASTROINTESTINAL ENDOSCOPY  DEC 2010   Bx-MILD GASTRITIS/DUODENITIS, DUODENAL DIVERTICULA, SCHATZKI'S RING    Family History  Problem Relation Age of Onset   Pancreatic cancer Sister    Colon cancer Paternal Aunt    Colon polyps Neg Hx     Social History   Socioeconomic History   Marital status: Married    Spouse name: [name]    Number of children: 3   Years of education: 12   Highest education level: 12th grade  Occupational History   Occupation: retired   Tobacco Use   Smoking status: Never   Smokeless tobacco: Never  Vaping Use   Vaping Use: Never used  Substance and Sexual Activity   Alcohol use: No    Alcohol/week: 0.0 standard drinks of alcohol   Drug use: No   Sexual activity: Not Currently  Other Topics Concern   Not on file  Social History Narrative   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/   Social Determinants of Health   Financial Resource Strain: Low Risk  (01/22/2022)   Overall Financial Resource Strain (CARDIA)    Difficulty of  Paying Living Expenses: Not hard at all  Food Insecurity: No Food Insecurity (01/22/2022)   Hunger Vital Sign    Worried About Running Out of Food in the Last Year: Never true    Ran Out of Food in the Last Year: Never true  Transportation Needs: No Transportation Needs (01/22/2022)   PRAPARE - Hydrologist (Medical): No    Lack of Transportation (Non-Medical): No  Physical Activity: Sufficiently Active (01/22/2022)   Exercise Vital Sign    Days of Exercise per Week: 5 days    Minutes of Exercise per Session: 60 min  Stress: No Stress Concern Present (01/22/2022)   [place]    Feeling of Stress : Not at all   Social Connections: [place] (01/22/2022)   Social Connection and Isolation Panel [NHANES]    Frequency of Communication with Friends and Family: Never    Frequency of Social Gatherings with Friends and Family: More than three times a week    Attends Religious Services: More than 4 times per year    Active Member of [employer] or Organizations: Yes    Attends Music therapist: More than 4 times per year    Marital Status: Married  Human resources officer Violence: Not At Risk (01/22/2022)   Humiliation, Afraid, Rape, and Kick questionnaire    Fear of Current or Ex-Partner: No    Emotionally Abused: No    Physically Abused: No    Sexually Abused: No    Outpatient Medications Prior to Visit  Medication Sig Dispense Refill   B Complex Vitamins (VITAMIN-B COMPLEX PO) Take 1 tablet by mouth daily.     Calcium Carbonate Antacid (TUMS PO) Take 1 tablet by mouth 3 (three) times daily as needed.     Cholecalciferol (VITAMIN D3 PO) Take 25 mcg by mouth daily.     conjugated estrogens (PREMARIN) vaginal cream Apply one fingertip to introitus every 2 days. 42.5 g 5   erythromycin ophthalmic ointment Place a 1/2 inch ribbon of ointment into the right lower eyelid BID prn. 3.5 g 0   famotidine (PEPCID) 20 MG tablet Take 1 tablet (20 mg total) by mouth daily before supper.     hyoscyamine (ANASPAZ) 0.125 MG TBDP disintergrating tablet Place one tablet under tongue twice daily, as needed, for abdominal spasm 60 tablet 3   imipramine (TOFRANIL) 10 MG tablet Take 1 tablet (10 mg total) by mouth at bedtime. 30 tablet 3   mirabegron ER (MYRBETRIQ) 25 MG TB24 tablet Take 1 tablet (25 mg total) by mouth daily. 30 tablet 3   OVER THE COUNTER MEDICATION Multi Collagen Protein - Patient states that she takes 1 scoop a day.     Probiotic Product (PROBIOTIC DAILY PO) Take 1 tablet by mouth daily.     ramipril (ALTACE) 10 MG capsule TAKE (1) CAPSULE BY MOUTH ONCE DAILY. 90 capsule 0   Simethicone  (GAS-X PO) Take by mouth. Per Patient she takes a couple (2) a day.     vitamin C (ASCORBIC ACID) 500 MG tablet Take 500 mg by mouth daily.     Zinc 50 MG TABS Take 1 tablet by mouth daily.     doxycycline (VIBRAMYCIN) 100 MG capsule Take 1 capsule (100 mg total) by mouth every 12 (twelve) hours. (Patient not taking: Reported on 06/03/2022) 14 capsule 0   No facility-administered medications prior to visit.    Allergies  Allergen Reactions   Codeine  Crestor [Rosuvastatin] Other (See Comments)    Can't recall   Fosamax [Alendronate Sodium]     heartburn    ROS Review of Systems  Constitutional:  Negative for chills, fatigue and fever.  HENT:  Positive for congestion, postnasal drip, rhinorrhea, sinus pressure and sinus pain. Negative for sore throat.   Respiratory:  Negative for chest tightness and shortness of breath.   Neurological:  Positive for headaches. Negative for dizziness.  Psychiatric/Behavioral:  Positive for self-injury.       Objective:    Physical Exam HENT:     Head: Normocephalic.     Nose: Congestion and rhinorrhea present.     Right Sinus: Maxillary sinus tenderness and frontal sinus tenderness present.     Left Sinus: Maxillary sinus tenderness and frontal sinus tenderness present.     Mouth/Throat:     Mouth: Mucous membranes are moist.  Cardiovascular:     Rate and Rhythm: Normal rate and regular rhythm.     Heart sounds: Normal heart sounds.  Pulmonary:     Effort: Pulmonary effort is normal.     Breath sounds: Normal breath sounds.  Neurological:     Mental Status: She is alert.     BP 114/68   Pulse 74   Ht _0  (1.651 m)   Wt 129 lb 12.8 oz (58.9 kg)   SpO2 98%   BMI 21.60 kg/m  Wt Readings from Last 3 Encounters:  06/03/22 129 lb 12.8 oz (58.9 kg)  04/09/22 129 lb 12.8 oz (58.9 kg)  03/21/22 130 lb (59 kg)    Lab Results  Component Value Date   TSH 3.180 07/27/2021   Lab Results  Component Value Date   WBC 5.9 07/27/2021    HGB 13.8 07/27/2021   HCT 41.4 07/27/2021   MCV 90 07/27/2021   PLT 253 07/27/2021   Lab Results  Component Value Date   NA 143 01/15/2022   K 4.5 01/15/2022   CO2 28 01/15/2022   GLUCOSE 94 01/15/2022   BUN 15 01/15/2022   CREATININE 0.86 01/15/2022   BILITOT 0.3 01/15/2022   ALKPHOS 137 (H) 01/15/2022   AST 24 01/15/2022   ALT 19 01/15/2022   PROT 6.7 01/15/2022   ALBUMIN 4.2 01/15/2022   CALCIUM 9.7 01/15/2022   ANIONGAP 8 12/04/2018   EGFR 66 01/15/2022   Lab Results  Component Value Date   CHOL 279 (H) 01/15/2022   Lab Results  Component Value Date   HDL 87 01/15/2022   Lab Results  Component Value Date   LDLCALC 167 (H) 01/15/2022   Lab Results  Component Value Date   TRIG 143 01/15/2022   Lab Results  Component Value Date   CHOLHDL 3.2 01/15/2022   Lab Results  Component Value Date   HGBA1C 5.6 09/03/2016      Assessment & Plan:   Problem List Items Addressed This Visit       Respiratory   Sinusitis - Primary    - will prescribe augmentin for 7 days -encouraged supportive treatments and symptomatic management -tessalon for cough      Relevant Medications   amoxicillin-clavulanate (AUGMENTIN) 875-125 MG tablet   benzonatate (TESSALON) 100 MG capsule   Other Visit Diagnoses     Cough in adult       Relevant Medications   benzonatate (TESSALON) 100 MG capsule       Meds ordered this encounter  Medications   amoxicillin-clavulanate (AUGMENTIN) 875-125 MG tablet    Sig:  Take 1 tablet by mouth 2 (two) times daily for 7 days.    Dispense:  14 tablet    Refill:  0   benzonatate (TESSALON) 100 MG capsule    Sig: Take 1 capsule (100 mg total) by mouth 2 (two) times daily as needed for cough.    Dispense:  20 capsule    Refill:  0   DISCONTD: fluticasone (FLONASE) 50 MCG/ACT nasal spray    Sig: Place 2 sprays into both nostrils daily.    Dispense:  16 g    Refill:  6    Follow-up: No follow-ups on file.    Alvira Monday,  FNP

## 2022-06-03 NOTE — Assessment & Plan Note (Addendum)
-   will prescribe augmentin for 7 days -encouraged supportive treatments and symptomatic management -tessalon for cough

## 2022-06-03 NOTE — Patient Instructions (Signed)
I appreciate the opportunity to provide care to you today!    Please pick up your medications at the pharmacy  - you can take tylenol/ motrin  headaches -Flonase for congestion -humidified air can improve mucus clearance   Please continue to a heart-healthy diet and increase your physical activities. Try to exercise for 20mns at least three times a week.      It was a pleasure to see you and I look forward to continuing to work together on your health and well-being. Please do not hesitate to call the office if you need care or have questions about your care.   Have a wonderful day and week. With Gratitude, GAlvira MondayMSN, FNP-BC'

## 2022-06-03 NOTE — Progress Notes (Signed)
sinus

## 2022-06-10 ENCOUNTER — Ambulatory Visit
Admission: EM | Admit: 2022-06-10 | Discharge: 2022-06-10 | Disposition: A | Discharge status: Home / Self Care | Payer: Medicare PPO | Attending: Nurse Practitioner | Admitting: Nurse Practitioner

## 2022-06-10 ENCOUNTER — Ambulatory Visit (INDEPENDENT_AMBULATORY_CARE_PROVIDER_SITE_OTHER): Payer: Medicare PPO

## 2022-06-10 DIAGNOSIS — R059 Cough, unspecified: Secondary | ICD-10-CM

## 2022-06-10 DIAGNOSIS — R110 Nausea: Secondary | ICD-10-CM

## 2022-06-10 DIAGNOSIS — J180 Bronchopneumonia, unspecified organism: Secondary | ICD-10-CM | POA: Diagnosis not present

## 2022-06-10 DIAGNOSIS — R051 Acute cough: Secondary | ICD-10-CM

## 2022-06-10 DIAGNOSIS — J9811 Atelectasis: Secondary | ICD-10-CM | POA: Diagnosis not present

## 2022-06-10 MED ORDER — ONDANSETRON 4 MG PO TBDP: 4.0000 mg | ORAL_TABLET | Freq: Three times a day (TID) | ORAL | 0 refills | Status: DC | PRN

## 2022-06-10 MED ORDER — DOXYCYCLINE HYCLATE 100 MG PO CAPS: 100.0000 mg | ORAL_CAPSULE | Freq: Two times a day (BID) | ORAL | 0 refills | Status: AC

## 2022-06-10 MED ORDER — BENZONATATE 100 MG PO CAPS: 100.0000 mg | ORAL_CAPSULE | Freq: Three times a day (TID) | ORAL | 0 refills | Status: DC | PRN

## 2022-06-10 NOTE — ED Triage Notes (Signed)
Pt reports nausea, cough, chest congestion and nasal congestion x 10 days. Pt finished amoxicillin yesterday without relief.

## 2022-06-10 NOTE — ED Provider Notes (Signed)
[facility]    CSN: [id number] Arrival date & time: 06/10/22  0940      History   Chief Complaint Chief Complaint  Patient presents with   Nausea   Nasal Congestion         HPI Kelsey Nelson is a 88 y.o. female.   Patient presents with 2 weeks of cough, sinus pain/pressure, headache, nausea, occasional shortness of breath, decreased appetite, and fatigue.  She endorses chest pain with coughing, otherwise denies chest pain.  Denies vomiting, diarrhea, rash, ear pain or drainage, sore throat.    She was seen about 1 week ago and treated for a sinus infection with Augmentin.  She has finished the antibiotic and reports no improvement.  Reports husband was recently sick with pneumonia which required hospitalization, ear infection, and left shoulder fracture.    Medical history significant for hyperlipidemia, hypertension, GERD, osteoporosis.  Denies any history of asthma, COPD, or chronic lung disease.      Past Medical History:  Diagnosis Date   Acute cystitis with hematuria 10/26/2013   Allergic rhinitis 11/08/2017   Blindness of right eye    BMI between 19-24,adult 2010 145 LBS   Diverticula of small intestine Dec 2010   Diverticulosis    [place]   DIVERTICULOSIS, SIGMOID COLON 03/31/2008   Qualifier: Diagnosis of  By: Dierdre Harness  Noted on abdominal and pelvic scan in 04/2011.  Extensive colonic diverticulosis , and possible muscular hypertrophy at rectosigmoid junction    Family hx of colon cancer 11/09/2013   Functional constipation 2010   GERD (gastroesophageal reflux disease)    HTN (hypertension)    Hyperlipemia    Pancreatic cyst 02/05/2013   Renal cyst 05/27/2011   Right eye trauma 1949   Loss of vision resulted at age 44   Schatzki's ring Dec 2010   Skin lesions, generalized 06/19/2015   Tubular adenoma 11/09/2013    Patient Active Problem List   Diagnosis Date Noted   Sinusitis 06/03/2022   Skin lesions 01/22/2022   Abdominal spasms 01/22/2022    Blepharitis of left lower eyelid 10/28/2021   Recurrent UTI 05/10/2021   Skipped heart beats 10/19/2020   Dermatitis 07/31/2020   COVID-19 virus infection 02/25/2020   Anterior knee pain, right 10/05/2019   Belching symptom 10/05/2019   Injury of left knee 11/28/2017   Annual physical exam 03/23/2016   Ectropion of right eye 11/22/2015   Acquired superior sulcus deformity of orbit 07/04/2015   History of evisceration of eye 07/04/2015   Nuclear sclerosis of left eye 07/04/2015   Urinary urgency 10/31/2013   Loss of taste 07/31/2012   Pancreatic cyst 04/21/2012   H/O gastroesophageal reflux (GERD) 04/21/2012   Nocturia 12/02/2011   Insomnia 09/20/2011   Nausea 06/22/2011   Constipation 06/21/2011   GERD 11/20/2009   Mixed hyperlipidemia 03/31/2008   BLINDNESS, RIGHT EYE 03/31/2008   HTN (hypertension) 03/31/2008   Osteoporosis 03/31/2008    Past Surgical History:  Procedure Laterality Date   ABDOMINAL HYSTERECTOMY     BREAST REDUCTION SURGERY  1992 BIL   COLONOSCOPY  DEC 2010   SIMPLE ADENOMAS (6-8), [place] TICS, SML IH   COLONOSCOPY N/A 12/08/2013   Procedure: COLONOSCOPY;  Surgeon: Rogene Houston, MD;  Location: [facility];  Service: Endoscopy;  Laterality: N/A;  225   EVISCERATION Right 05/08/2015   Procedure: EVISCERATION WITH IMPLANT RIGHT EYE;  Surgeon: Williams Che, MD;  Location: [facility];  Service: Ophthalmology;  Laterality: Right;  EYE SURGERY  RIGHT 2005   EYE SURGERY  right 2016   removed and false eye placed in 08/2015   TOTAL ABDOMINAL HYSTERECTOMY W/ BILATERAL SALPINGOOPHORECTOMY  1985 FIBROIDS   UPPER GASTROINTESTINAL ENDOSCOPY  DEC 2010   Bx-MILD GASTRITIS/DUODENITIS, DUODENAL DIVERTICULA, SCHATZKI'S RING    OB History     Gravida      Para      Term      Preterm      AB      Living  3      SAB      IAB      Ectopic      Multiple      Live Births               Home Medications    Prior to Admission medications    Medication Sig Start Date End Date Taking? Authorizing Provider  B Complex Vitamins (VITAMIN-B COMPLEX PO) Take 1 tablet by mouth daily.    [provider]  benzonatate (TESSALON) 100 MG capsule Take 1 capsule (100 mg total) by mouth 3 (three) times daily as needed for cough. Do not take while driving or operating heavy machinery 06/10/22   Eulogio Bear, NP  Calcium Carbonate Antacid (TUMS PO) Take 1 tablet by mouth 3 (three) times daily as needed.    [provider]  Cholecalciferol (VITAMIN D3 PO) Take 25 mcg by mouth daily.    [provider]  conjugated estrogens (PREMARIN) vaginal cream Apply one fingertip to introitus every 2 days. 12/20/20   Perlie Mayo, NP  doxycycline (VIBRAMYCIN) 100 MG capsule Take 1 capsule (100 mg total) by mouth 2 (two) times daily for 5 days. 06/10/22 06/15/22 Yes Eulogio Bear, NP  famotidine (PEPCID) 20 MG tablet Take 1 tablet (20 mg total) by mouth daily before supper. 03/13/21   Rogene Houston, MD  hyoscyamine (ANASPAZ) 0.125 MG TBDP disintergrating tablet Place one tablet under tongue twice daily, as needed, for abdominal spasm 01/22/22   Fayrene Helper, MD  imipramine (TOFRANIL) 10 MG tablet Take 1 tablet (10 mg total) by mouth at bedtime. 01/22/22   Fayrene Helper, MD  mirabegron ER (MYRBETRIQ) 25 MG TB24 tablet Take 1 tablet (25 mg total) by mouth daily. 01/22/22   Fayrene Helper, MD  ondansetron (ZOFRAN-ODT) 4 MG disintegrating tablet Take 1 tablet (4 mg total) by mouth every 8 (eight) hours as needed for nausea or vomiting. 06/10/22  Yes Eulogio Bear, NP  OVER THE COUNTER MEDICATION Multi Collagen Protein - Patient states that she takes 1 scoop a day.    [provider]  Probiotic Product (PROBIOTIC DAILY PO) Take 1 tablet by mouth daily.    [provider]  ramipril (ALTACE) 10 MG capsule TAKE (1) CAPSULE BY MOUTH ONCE DAILY. 03/08/22   Fayrene Helper, MD  Simethicone (GAS-X  PO) Take by mouth. Per Patient she takes a couple (2) a day.    [provider]  vitamin C (ASCORBIC ACID) 500 MG tablet Take 500 mg by mouth daily.    [provider]  Zinc 50 MG TABS Take 1 tablet by mouth daily.    [provider]    Family History Family History  Problem Relation Age of Onset   Pancreatic cancer Sister    Colon cancer Paternal Aunt    Colon polyps Neg Hx     Social History Social History   Tobacco Use  Smoking status: Never   Smokeless tobacco: Never  Vaping Use   Vaping Use: Never used  Substance Use Topics   Alcohol use: No    Alcohol/week: 0.0 standard drinks of alcohol   Drug use: No     Allergies   Codeine, Crestor [rosuvastatin], and Fosamax [alendronate sodium]   Review of Systems Review of Systems Per HPI  Physical Exam Triage Vital Signs ED Triage Vitals  Enc Vitals Group     BP 06/10/22 1042 (!) 144/83     Pulse Rate 06/10/22 1042 84     Resp 06/10/22 1042 18     Temp 06/10/22 1042 98.2 F (36.8 C)     Temp Source 06/10/22 1042 Oral     SpO2 06/10/22 1042 95 %     Weight --      Height --      Head Circumference --      Peak Flow --      Pain Score 06/10/22 1041 0     Pain Loc --      Pain Edu? --      Excl. in [place]? --    No data found.  Updated Vital Signs BP (!) 144/83 (BP Location: Right Arm)   Pulse 84   Temp 98.2 F (36.8 C) (Oral)   Resp 18   SpO2 95%   Visual Acuity Right Eye Distance:   Left Eye Distance:   Bilateral Distance:    Right Eye Near:   Left Eye Near:    Bilateral Near:     Physical Exam Vitals and nursing note reviewed.  Constitutional:      General: She is not in acute distress.    Appearance: Normal appearance. She is not ill-appearing or toxic-appearing.  HENT:     Head: Normocephalic and atraumatic.     Right Ear: Ear canal and external ear normal. A middle ear effusion is present. Tympanic membrane is not injected, scarred, perforated, erythematous or  retracted.     Left Ear: Ear canal and external ear normal. A middle ear effusion is present. Tympanic membrane is not injected, scarred, perforated, erythematous or retracted.     Nose: Congestion and rhinorrhea present.     Mouth/Throat:     Mouth: Mucous membranes are moist.     Pharynx: Oropharynx is clear. Posterior oropharyngeal erythema present. No oropharyngeal exudate.     Comments: Cobblestoning of posterior pharynx Eyes:     General: No scleral icterus.    Extraocular Movements: Extraocular movements intact.  Cardiovascular:     Rate and Rhythm: Normal rate and regular rhythm.  Pulmonary:     Effort: Pulmonary effort is normal. No respiratory distress.     Breath sounds: Normal breath sounds. No wheezing, rhonchi or rales.  Abdominal:     General: Abdomen is flat.  Musculoskeletal:     Cervical back: Normal range of motion and neck supple.  Lymphadenopathy:     Cervical: No cervical adenopathy.  Skin:    General: Skin is warm and dry.     Capillary Refill: Capillary refill takes less than 2 seconds.     Coloration: Skin is not jaundiced or pale.     Findings: No erythema or rash.  Neurological:     Mental Status: She is alert and oriented to person, place, and time.  Psychiatric:        Behavior: Behavior is cooperative.      UC Treatments / Results  Labs (all labs ordered are listed,  but only abnormal results are displayed) Labs Reviewed - No data to display  EKG   Radiology DG Chest 2 View  Result Date: 06/10/2022 CLINICAL DATA:  An 88 year old female presents for evaluation of decreased appetite and fatigue with cough. EXAM: CHEST - 2 VIEW COMPARISON:  December 04, 2018. FINDINGS: Cardiomediastinal contours and hilar structures are stable. Subtle added density at the LEFT costodiaphragmatic sulcus is unchanged. There is no pneumothorax. No lobar consolidation or sign of pleural effusion. Mild bronchial wall thickening is suggested more on the lateral  projection. Minimal opacity linear along the RIGHT lateral chest is also likely related to scarring. On limited assessment there is no acute skeletal finding. IMPRESSION: 1. Mild bronchial wall thickening could reflect bronchitis or early bronchopneumonia. No lobar consolidation or effusion. 2. Signs of scarring and atelectasis. Electronically Signed   By: Zetta Bills M.D.   On: 06/10/2022 11:11    Procedures Procedures (including critical care time)  Medications Ordered in UC Medications - No data to display  Initial Impression / Assessment and Plan / UC Course  I have reviewed the triage vital signs and the nursing notes.  Pertinent labs & imaging results that were available during my care of the patient were reviewed by me and considered in my medical decision making (see chart for details).    88 year old very pleasant, well-appearing female presenting with ongoing cough, sinus pressure, and nausea for the past 2 weeks.  Chest x-ray today shows mild bronchial wall thickening, possible early bronchopneumonia.  Will cover with doxycycline twice daily for 5 days for possible community-acquired pneumonia.  Increased frequency of Tessalon Perles, refill given for this prescription, can also use over-the-counter throat lozenges with lemon and honey to help suppress cough.  Lastly, can use ondansetron 4 mg every 8 hours as needed for nausea/vomiting that is likely secondary to the cough and decreased appetite.  Encouraged hydration with plenty of fluids, seek care in emergency room if symptoms worsen despite treatment.  The patient was given the opportunity to ask questions.  All questions answered to their satisfaction.  The patient is in agreement to this plan.   Final Clinical Impressions(s) / UC Diagnoses   Final diagnoses:  Bronchopneumonia     Discharge Instructions      - The chest x-ray shows early possible bronchopneumonia; please start the doxycycline which is an antibiotic to  help with this -You can increase on the frequency of the cough Perles up to every 8 hours as needed -Take ondansetron every 8 hours as needed for nausea/vomiting, I suspect this is related to your acute illness -You can use throat lozenges with honey and lemon to help suppress the cough -Please seek care in the emergency room if you develop worsening symptoms     ED Prescriptions     Medication Sig Dispense Auth. Provider   benzonatate (TESSALON) 100 MG capsule Take 1 capsule (100 mg total) by mouth 3 (three) times daily as needed for cough. Do not take while driving or operating heavy machinery 30 capsule Noemi Chapel A, NP   ondansetron (ZOFRAN-ODT) 4 MG disintegrating tablet Take 1 tablet (4 mg total) by mouth every 8 (eight) hours as needed for nausea or vomiting. 20 tablet Noemi Chapel A, NP   doxycycline (VIBRAMYCIN) 100 MG capsule Take 1 capsule (100 mg total) by mouth 2 (two) times daily for 5 days. 10 capsule Eulogio Bear, NP      PDMP not reviewed this encounter.   Eulogio Bear,  NP 06/10/22 1129

## 2022-06-10 NOTE — Discharge Instructions (Signed)
-   The chest x-ray shows early possible bronchopneumonia; please start the doxycycline which is an antibiotic to help with this -You can increase on the frequency of the cough Perles up to every 8 hours as needed -Take ondansetron every 8 hours as needed for nausea/vomiting, I suspect this is related to your acute illness -You can use throat lozenges with honey and lemon to help suppress the cough -Please seek care in the emergency room if you develop worsening symptoms

## 2022-06-21 ENCOUNTER — Encounter: Payer: Self-pay | Admitting: Nurse Practitioner

## 2022-06-21 ENCOUNTER — Ambulatory Visit (INDEPENDENT_AMBULATORY_CARE_PROVIDER_SITE_OTHER): Payer: Medicare PPO | Admitting: Nurse Practitioner

## 2022-06-21 VITALS — BP 122/82 | HR 68 | Temp 98.3°F | Ht 65.0 in | Wt 125.0 lb

## 2022-06-21 DIAGNOSIS — R110 Nausea: Secondary | ICD-10-CM | POA: Diagnosis not present

## 2022-06-21 DIAGNOSIS — K5909 Other constipation: Secondary | ICD-10-CM | POA: Diagnosis not present

## 2022-06-21 DIAGNOSIS — R21 Rash and other nonspecific skin eruption: Secondary | ICD-10-CM | POA: Insufficient documentation

## 2022-06-21 MED ORDER — POLYETHYLENE GLYCOL 3350 17 GM/SCOOP PO POWD: 17.0000 g | Freq: Every day | ORAL | 1 refills | Status: DC | PRN

## 2022-06-21 MED ORDER — TRIAMCINOLONE ACETONIDE 0.025 % EX OINT: 1.0000 | TOPICAL_OINTMENT | Freq: Two times a day (BID) | CUTANEOUS | 0 refills | Status: DC

## 2022-06-21 MED ORDER — ONDANSETRON 4 MG PO TBDP: 4.0000 mg | ORAL_TABLET | Freq: Three times a day (TID) | ORAL | 0 refills | Status: DC | PRN

## 2022-06-21 NOTE — Assessment & Plan Note (Signed)
Rx Kenalog 0.025% Apply to affected site 2 times daily

## 2022-06-21 NOTE — Progress Notes (Signed)
   Kelsey Nelson     MRN: [id number]      DOB: 1934-11-05   HPI Kelsey Nelson with past medical history of hypertension, GERD, osteoporosis, mixed hyperlipidemia, constipation is here for complaints of nausea.  Patient was recently treated for bronchopneumonia pneumonia.  Stated that she has completed her full course of doxycycline.  She reports that her cough is much better. patient reports that she has been feeling nauseated all the time since 3 weeks ago, she has chronic constipation which she takes stool softener for.  She states that she still feels constipated despite taking stool softener.  Patient denies vomiting, headache, dizziness, bloody stool, fever, chills she stated she has a lots of gas takes famotidine daily, Tums as needed     Patient complains of rashes on her left arm, both sides of her neck  that started 4 days ago.  She stated that her rashes are itchy.  She recently had doxycycline for bronco pneumonia .she has not taken any medication for her rashes.  She denies fever, chills,        ROS Denies recent fever or chills. Denies sinus pressure, nasal congestion, ear pain or sore throat. Denies chest congestion, productive cough or wheezing. Denies chest pains, palpitations and leg swelling Denies dysuria, frequency, hesitancy or incontinence. Denies joint pain, swelling and limitation in mobility. Denies headaches, seizures, numbness, or tingling. Denies depression, anxiety or insomnia.    PE  BP 122/82 (BP Location: Right Arm, Cuff Size: Normal)   Pulse 68   Temp 98.3 F (36.8 C)   Ht '5\' 5"'$  (1.651 m)   Wt 125 lb (56.7 kg)   SpO2 96%   BMI 20.80 kg/m   Patient alert and oriented and in no cardiopulmonary distress.  HEENT: No facial asymmetry, EOMI,     Neck supple .  Chest: Clear to auscultation bilaterally.  CVS: S1, S2 no murmurs, no S3.Regular rate.  ABD: Soft non tender, mild tenderness noted on palpation of bilateral lower quadrants of her abdomen.    Skin: Multiple erythematous non fluid filled rashes noted on both sides of her neck and left fore arm  Psych: Good eye contact, normal affect. Memory intact not anxious or depressed appearing.  CNS: CN 2-12 intact, power,  normal throughout.no focal deficits noted.   Assessment & Plan  Constipation Chronic condition, has mild tenderness on palpation of bilateral lower quadrants of her abdomen. Currently taking over-the-counter stool softener Patient told to start taking MiraLAX 17 g daily as needed Continue over-the-counter stool softener Maintain proper hydration Increase intake of fiber Patient requested for referral to  GI referral placed   Nausea Nausea could be due to her chronic constipation. Also She has taken  Antibiotics recently which could be contributing to her nausea. Take Zofran 4 mg every 8 hours as needed.  Medication refilled Patient encouraged to drink ginger ale as needed  Patient referred to GI  Rash and other nonspecific skin eruption Rx Kenalog 0.025% Apply to affected site 2 times daily

## 2022-06-21 NOTE — Assessment & Plan Note (Signed)
Nausea could be due to her chronic constipation. Also She has taken  Antibiotics recently which could be contributing to her nausea. Take Zofran 4 mg every 8 hours as needed.  Medication refilled Patient encouraged to drink ginger ale as needed  Patient referred to GI

## 2022-06-21 NOTE — Assessment & Plan Note (Addendum)
Chronic condition, has mild tenderness on palpation of bilateral lower quadrants of her abdomen. Currently taking over-the-counter stool softener Patient told to start taking MiraLAX 17 g daily as needed Continue over-the-counter stool softener Maintain proper hydration Increase intake of fiber Patient requested for referral to  GI referral placed

## 2022-06-21 NOTE — Patient Instructions (Addendum)
For constipation it is important that you have an adequate intake of fruit and vegetables daily, at least 3 servings of each, as well as water intake of at least 48 ounces daily and regular exercise.  OTC stool softeners are helpful for daily use, up to 4 daily (eg. Colace)  Fiber intake daily is needed, in the form of Bran or Shredded Wheat    Take mira lax 17g once daily as needed for your constipation .   Please drink ginger ale as needed to help with your nausea, take zofran '4mg'$  every 8 hours as needed.  You have been referred to GI   Pease apply kenalog cream , one application two times daily for your  rashes

## 2022-06-24 ENCOUNTER — Other Ambulatory Visit: Payer: Self-pay | Admitting: Family Medicine

## 2022-06-27 ENCOUNTER — Encounter (INDEPENDENT_AMBULATORY_CARE_PROVIDER_SITE_OTHER): Payer: Self-pay | Admitting: *Deleted

## 2022-07-01 ENCOUNTER — Encounter (INDEPENDENT_AMBULATORY_CARE_PROVIDER_SITE_OTHER): Payer: Self-pay | Admitting: Gastroenterology

## 2022-07-01 ENCOUNTER — Ambulatory Visit (INDEPENDENT_AMBULATORY_CARE_PROVIDER_SITE_OTHER): Payer: Medicare PPO | Admitting: Gastroenterology

## 2022-07-01 VITALS — BP 151/84 | HR 82 | Temp 98.4°F | Ht 65.0 in | Wt 128.2 lb

## 2022-07-01 DIAGNOSIS — K5904 Chronic idiopathic constipation: Secondary | ICD-10-CM

## 2022-07-01 NOTE — Progress Notes (Unsigned)
Maylon Peppers, M.D. Gastroenterology & Hepatology [facility] [address] [place], [place] [address]  Primary Care Physician: Fayrene Helper, MD [address], [place] [address]  I will communicate my assessment and recommendations to the referring MD via EMR.  Problems: Chronic idiopathic constipation  History of Present Illness: Kelsey Nelson is a 88 y.o. female with past medical history of GERD, hypertension, hyperlipidemia, who presents for follow up of constipation and nausea.  The patient was last seen on 03/13/2021. At that time, the patient was advised to start taking famotidine 20 mg every night due to postprandial pain.  She was advised to take Colace and Dulcolax for constipation.  Patient reports that she has felt that for the last months he has presented more constipation and nausea. She has been moving her bowels every 3 days usually. She reports stools are a Bristol 2 in consistency, they are dark but no melena. She has to strain to move her bowels. She takes Miralax one capful as needed and Pepcid as needed. She feels frequently nauseated, which sometimes improves after she moves her bowels.  Most recent TSH 3.1 on 07/27/21. CMP on 01/15/2022 had normal electrolytes.  She also noticed some bumps in her skin a couple of weeks ago, which has caused significant itching . She was prescribed triamcinolone ointment without improvement. She will like to talk to another provider first before considering a dermatology evaluation.  The patient denies having any  vomiting, fever, chills, hematochezia, melena, hematemesis, abdominal distention, abdominal pain, diarrhea, jaundice, pruritus or weight loss.  Last EGD: Last Colonoscopy: 2014 Examination performed to cecum. Small polyps ablated via cold biopsy from proximal transverse colon. Multiple diverticula at sigmoid colon. Small external hemorrhoids.  Past  Medical History: Past Medical History:  Diagnosis Date   Acute cystitis with hematuria 10/26/2013   Allergic rhinitis 11/08/2017   Blindness of right eye    BMI between 19-24,adult 2010 145 LBS   Diverticula of small intestine Dec 2010   Diverticulosis    [place]   DIVERTICULOSIS, SIGMOID COLON 03/31/2008   Qualifier: Diagnosis of  By: Dierdre Harness  Noted on abdominal and pelvic scan in 04/2011.  Extensive colonic diverticulosis , and possible muscular hypertrophy at rectosigmoid junction    Family hx of colon cancer 11/09/2013   Functional constipation 2010   GERD (gastroesophageal reflux disease)    HTN (hypertension)    Hyperlipemia    Pancreatic cyst 02/05/2013   Renal cyst 05/27/2011   Right eye trauma 1949   Loss of vision resulted at age 50   Schatzki's ring Dec 2010   Skin lesions, generalized 06/19/2015   Tubular adenoma 11/09/2013    Past Surgical History: Past Surgical History:  Procedure Laterality Date   ABDOMINAL HYSTERECTOMY     BREAST REDUCTION SURGERY  1992 BIL   COLONOSCOPY  DEC 2010   SIMPLE ADENOMAS (6-8), [place] TICS, SML IH   COLONOSCOPY N/A 12/08/2013   Procedure: COLONOSCOPY;  Surgeon: Rogene Houston, MD;  [facility];  Service: Endoscopy;  Laterality: N/A;  225   EVISCERATION Right 05/08/2015   Procedure: EVISCERATION WITH IMPLANT RIGHT EYE;  Surgeon: Williams Che, MD;  Location: [facility];  Service: Ophthalmology;  Laterality: Right;   EYE SURGERY  RIGHT 2005   EYE SURGERY  right 2016   removed and false eye placed in 08/2015   [place]  UPPER GASTROINTESTINAL ENDOSCOPY  DEC 2010   Bx-MILD GASTRITIS/DUODENITIS, DUODENAL DIVERTICULA, SCHATZKI'S RING    Family History: Family History  Problem Relation Age of Onset   Pancreatic cancer Sister    Colon cancer Paternal Aunt    Colon polyps Neg Hx     Social History: Social History   Tobacco Use  Smoking Status Never   Smokeless Tobacco Never   Social History   Substance and Sexual Activity  Alcohol Use No   Alcohol/week: 0.0 standard drinks of alcohol   Social History   Substance and Sexual Activity  Drug Use No    Allergies: Allergies  Allergen Reactions   Codeine    Crestor [Rosuvastatin] Other (See Comments)    Can't recall   Fosamax [Alendronate Sodium]     heartburn    Medications: Current Outpatient Medications  Medication Sig Dispense Refill   B Complex Vitamins (VITAMIN-B COMPLEX PO) Take 1 tablet by mouth daily.     Calcium Carbonate Antacid (TUMS PO) Take 1 tablet by mouth 3 (three) times daily as needed.     Cholecalciferol (VITAMIN D3 PO) Take 25 mcg by mouth daily.     conjugated estrogens (PREMARIN) vaginal cream Apply one fingertip to introitus every 2 days. 42.5 g 5   famotidine (PEPCID) 20 MG tablet Take 1 tablet (20 mg total) by mouth daily before supper.     ondansetron (ZOFRAN-ODT) 4 MG disintegrating tablet Take 1 tablet (4 mg total) by mouth every 8 (eight) hours as needed for nausea or vomiting. 20 tablet 0   OVER THE COUNTER MEDICATION Multi Collagen Protein - Patient states that she takes 1 scoop a day.     polyethylene glycol powder (GLYCOLAX/MIRALAX) 17 GM/SCOOP powder Take 17 g by mouth daily as needed. 3350 g 1   Probiotic Product (PROBIOTIC DAILY PO) Take 1 tablet by mouth daily.     ramipril (ALTACE) 10 MG capsule TAKE (1) CAPSULE BY MOUTH ONCE DAILY. 90 capsule 0   Zinc 50 MG TABS Take 1 tablet by mouth daily.     vitamin C (ASCORBIC ACID) 500 MG tablet Take 500 mg by mouth daily. (Patient not taking: Reported on 07/01/2022)     No current facility-administered medications for this visit.    Review of Systems: GENERAL: negative for malaise, night sweats HEENT: No changes in hearing or vision, no nose bleeds or other nasal problems. NECK: Negative for lumps, goiter, pain and significant neck swelling RESPIRATORY: Negative for cough,  wheezing CARDIOVASCULAR: Negative for chest pain, leg swelling, palpitations, orthopnea GI: SEE HPI MUSCULOSKELETAL: Negative for joint pain or swelling, back pain, and muscle pain. SKIN: Negative for lesions, rash PSYCH: Negative for sleep disturbance, mood disorder and recent psychosocial stressors. HEMATOLOGY Negative for prolonged bleeding, bruising easily, and swollen nodes. ENDOCRINE: Negative for cold or heat intolerance, polyuria, polydipsia and goiter. NEURO: negative for tremor, gait imbalance, syncope and seizures. The remainder of the review of systems is noncontributory.   Physical Exam: BP (!) 151/84 (BP Location: Left Arm, Patient Position: Sitting, Cuff Size: Small)   Pulse 82   Temp 98.4 F (36.9 C) (Oral)   Ht '5\' 5"'$  (1.651 m)   Wt 128 lb 3.2 oz (58.2 kg)   BMI 21.33 kg/m  GENERAL: The patient is AO x3, in no acute distress. HEENT: Head is normocephalic and atraumatic. EOMI are intact. Mouth is well hydrated and without lesions. NECK: Supple. No masses LUNGS: Clear to auscultation. No presence of rhonchi/wheezing/rales. Adequate chest expansion HEART: RRR,  normal s1 and s2. ABDOMEN: Soft, nontender, no guarding, no peritoneal signs, and nondistended. BS +. No masses. EXTREMITIES: Without any cyanosis, clubbing, rash, lesions or edema. NEUROLOGIC: AOx3, no focal motor deficit. SKIN: no jaundice, has mild erythematous raised plaques in hands  Imaging/Labs: as above  I personally reviewed and interpreted the available labs, imaging and endoscopic files.  Impression and Plan: Kelsey Nelson is a 88 y.o. female with past medical history of GERD, hypertension, hyperlipidemia, who presents for follow up of constipation and nausea.  The patient reports having persistent constipation with her current regimen of laxatives.  Unfortunately she has been taking MiraLAX as needed and not on a frequent basis.  I explained to her that she should take a bowel regimen on a daily  basis for which we will start her on MiraLAX 1 capful every day and uptitrate as needed.  She will also benefit from implementing dietary changes with kiwi or prunes.  If her symptoms persist we will recheck her thyroid function and electrolytes in the future.  - Start taking Miralax 1 capful every day for one week. If bowel movements do not improve, increase to 1 capful every 12 hours. If after two weeks there is no improvement, increase to 1 capful every 8 hours - Eat prune and/or kiwi daily -If persistent constipation, will check repeat CMP and TSH.  All questions were answered.      Harvel Quale, MD Gastroenterology and Hepatology [facility]

## 2022-07-01 NOTE — Patient Instructions (Signed)
Start taking Miralax 1 capful every day for one week. If bowel movements do not improve, increase to 1 capful every 12 hours. If after two weeks there is no improvement, increase to 1 capful every 8 hours Eat prune and/or kiwi daily

## 2022-07-02 ENCOUNTER — Encounter: Payer: Self-pay | Admitting: Emergency Medicine

## 2022-07-02 ENCOUNTER — Ambulatory Visit
Admission: EM | Admit: 2022-07-02 | Discharge: 2022-07-02 | Disposition: A | Discharge status: Home / Self Care | Payer: Medicare PPO | Attending: Family Medicine | Admitting: Family Medicine

## 2022-07-02 ENCOUNTER — Other Ambulatory Visit: Payer: Self-pay

## 2022-07-02 DIAGNOSIS — R21 Rash and other nonspecific skin eruption: Secondary | ICD-10-CM

## 2022-07-02 MED ORDER — METHYLPREDNISOLONE SODIUM SUCC 125 MG IJ SOLR
60.0000 mg | Freq: Once | INTRAMUSCULAR | Status: AC
  Administered 2022-07-02: 60 mg via INTRAMUSCULAR

## 2022-07-02 NOTE — ED Provider Notes (Signed)
[facility]    CSN: [id number] Arrival date & time: 07/02/22  1331      History   Chief Complaint Chief Complaint  Patient presents with   Pruritis    HPI Kelsey Nelson is a 88 y.o. female.   Presenting today with several day history of posterior neck, right forearm, left foot itching, redness, swelling.  States the symptoms wax and wane, worse currently on the right forearm.  Denies any chest tightness, shortness of breath, throat itching or swelling, new outdoor exposures, new products or medications, new foods or exposures.  So far trying hydrocortisone cream, triamcinolone with no relief.    Past Medical History:  Diagnosis Date   Acute cystitis with hematuria 10/26/2013   Allergic rhinitis 11/08/2017   Blindness of right eye    BMI between 19-24,adult 2010 145 LBS   Diverticula of small intestine Dec 2010   Diverticulosis    [place]   DIVERTICULOSIS, SIGMOID COLON 03/31/2008   Qualifier: Diagnosis of  By: Dierdre Harness  Noted on abdominal and pelvic scan in 04/2011.  Extensive colonic diverticulosis , and possible muscular hypertrophy at rectosigmoid junction    Family hx of colon cancer 11/09/2013   Functional constipation 2010   GERD (gastroesophageal reflux disease)    HTN (hypertension)    Hyperlipemia    Pancreatic cyst 02/05/2013   Renal cyst 05/27/2011   Right eye trauma 1949   Loss of vision resulted at age 68   Schatzki's ring Dec 2010   Skin lesions, generalized 06/19/2015   Tubular adenoma 11/09/2013    Patient Active Problem List   Diagnosis Date Noted   Rash and other nonspecific skin eruption 06/21/2022   Sinusitis 06/03/2022   Skin lesions 01/22/2022   Abdominal spasms 01/22/2022   Blepharitis of left lower eyelid 10/28/2021   Recurrent UTI 05/10/2021   Skipped heart beats 10/19/2020   Dermatitis 07/31/2020   COVID-19 virus infection 02/25/2020   Anterior knee pain, right 10/05/2019   Belching symptom 10/05/2019   Injury of left knee  11/28/2017   Annual physical exam 03/23/2016   Ectropion of right eye 11/22/2015   Acquired superior sulcus deformity of orbit 07/04/2015   History of evisceration of eye 07/04/2015   Nuclear sclerosis of left eye 07/04/2015   Urinary urgency 10/31/2013   Loss of taste 07/31/2012   Pancreatic cyst 04/21/2012   H/O gastroesophageal reflux (GERD) 04/21/2012   Nocturia 12/02/2011   Insomnia 09/20/2011   Nausea 06/22/2011   Constipation 06/21/2011   GERD 11/20/2009   Mixed hyperlipidemia 03/31/2008   BLINDNESS, RIGHT EYE 03/31/2008   HTN (hypertension) 03/31/2008   Osteoporosis 03/31/2008    Past Surgical History:  Procedure Laterality Date   ABDOMINAL HYSTERECTOMY     BREAST REDUCTION SURGERY  1992 BIL   COLONOSCOPY  DEC 2010   SIMPLE ADENOMAS (6-8), [place] TICS, SML IH   COLONOSCOPY N/A 12/08/2013   Procedure: COLONOSCOPY;  Surgeon: Rogene Houston, MD;  [facility];  Service: Endoscopy;  Laterality: N/A;  225   EVISCERATION Right 05/08/2015   Procedure: EVISCERATION WITH IMPLANT RIGHT EYE;  Surgeon: Williams Che, MD;  Location: [facility];  Service: Ophthalmology;  Laterality: Right;   EYE SURGERY  RIGHT 2005   EYE SURGERY  right 2016   removed and false eye placed in 08/2015   TOTAL ABDOMINAL HYSTERECTOMY W/ BILATERAL SALPINGOOPHORECTOMY  1985 FIBROIDS   UPPER GASTROINTESTINAL ENDOSCOPY  DEC 2010   Bx-MILD GASTRITIS/DUODENITIS, DUODENAL DIVERTICULA, SCHATZKI'S RING  OB History     Gravida      Para      Term      Preterm      AB      Living  3      SAB      IAB      Ectopic      Multiple      Live Births               Home Medications    Prior to Admission medications   Medication Sig Start Date End Date Taking? Authorizing Provider  B Complex Vitamins (VITAMIN-B COMPLEX PO) Take 1 tablet by mouth daily.   Yes [provider]  Calcium Carbonate Antacid (TUMS PO) Take 1 tablet by mouth 3 (three) times daily as needed.   Yes  [provider]  Cholecalciferol (VITAMIN D3 PO) Take 25 mcg by mouth daily.   Yes [provider]  conjugated estrogens (PREMARIN) vaginal cream Apply one fingertip to introitus every 2 days. 12/20/20  Yes Perlie Mayo, NP  famotidine (PEPCID) 20 MG tablet Take 1 tablet (20 mg total) by mouth daily before supper. 03/13/21  Yes Rehman, Mechele Dawley, MD  ondansetron (ZOFRAN-ODT) 4 MG disintegrating tablet Take 1 tablet (4 mg total) by mouth every 8 (eight) hours as needed for nausea or vomiting. 06/21/22  Yes Paseda, Dewaine Conger, FNP  OVER THE COUNTER MEDICATION Multi Collagen Protein - Patient states that she takes 1 scoop a day.   Yes [provider]  Probiotic Product (PROBIOTIC DAILY PO) Take 1 tablet by mouth daily.   Yes [provider]  ramipril (ALTACE) 10 MG capsule TAKE (1) CAPSULE BY MOUTH ONCE DAILY. 06/24/22  Yes Fayrene Helper, MD  vitamin C (ASCORBIC ACID) 500 MG tablet Take 500 mg by mouth daily.   Yes [provider]  Zinc 50 MG TABS Take 1 tablet by mouth daily.   Yes [provider]  polyethylene glycol powder (GLYCOLAX/MIRALAX) 17 GM/SCOOP powder Take 17 g by mouth daily as needed. 06/21/22   Renee Rival, FNP    Family History Family History  Problem Relation Age of Onset   Pancreatic cancer Sister    Colon cancer Paternal Aunt    Colon polyps Neg Hx     Social History Social History   Tobacco Use   Smoking status: Never   Smokeless tobacco: Never  Vaping Use   Vaping Use: Never used  Substance Use Topics   Alcohol use: No    Alcohol/week: 0.0 standard drinks of alcohol   Drug use: No     Allergies   Codeine, Crestor [rosuvastatin], and Fosamax [alendronate sodium]   Review of Systems Review of Systems Per HPI  Physical Exam Triage Vital Signs ED Triage Vitals [07/02/22 1341]  Enc Vitals Group     BP (!) 144/86     Pulse Rate 79     Resp 18     Temp 98.1 F (36.7 C)     Temp Source  Oral     SpO2 95 %     Weight      Height      Head Circumference      Peak Flow      Pain Score 0     Pain Loc      Pain Edu?      Excl. in [place]?    No data found.  Updated Vital Signs BP (!) 144/86 (BP  Location: Right Arm)   Pulse 79   Temp 98.1 F (36.7 C) (Oral)   Resp 18   SpO2 95%   Visual Acuity Right Eye Distance:   Left Eye Distance:   Bilateral Distance:    Right Eye Near:   Left Eye Near:    Bilateral Near:     Physical Exam Vitals and nursing note reviewed.  Constitutional:      Appearance: Normal appearance. She is not ill-appearing.  HENT:     Head: Atraumatic.  Eyes:     Extraocular Movements: Extraocular movements intact.     Conjunctiva/sclera: Conjunctivae normal.  Cardiovascular:     Rate and Rhythm: Normal rate and regular rhythm.     Heart sounds: Normal heart sounds.  Pulmonary:     Effort: Pulmonary effort is normal.     Breath sounds: Normal breath sounds.  Musculoskeletal:        General: Normal range of motion.     Cervical back: Normal range of motion and neck supple.  Skin:    General: Skin is warm.     Findings: Rash present.     Comments: Areas of erythema, edema posterior neck, right forearm, left foot.  No blistering, obvious papular lesions in these areas.  Neurological:     Mental Status: She is alert and oriented to person, place, and time.  Psychiatric:        Mood and Affect: Mood normal.        Thought Content: Thought content normal.        Judgment: Judgment normal.      UC Treatments / Results  Labs (all labs ordered are listed, but only abnormal results are displayed) Labs Reviewed - No data to display  EKG   Radiology No results found.  Procedures Procedures (including critical care time)  Medications Ordered in UC Medications  methylPREDNISolone sodium succinate (SOLU-MEDROL) 125 mg/2 mL injection 60 mg (60 mg Intramuscular Given 07/02/22 1402)    Initial Impression / Assessment and Plan / UC  Course  I have reviewed the triage vital signs and the nursing notes.  Pertinent labs & imaging results that were available during my care of the patient were reviewed by me and considered in my medical decision making (see chart for details).     Suspect allergic component, however unclear trigger at this time.  We will treat with IM Solu-Medrol, antihistamines, continued steroid ointments topically.  Discussed unscented products, avoidance of any possible triggers.  Return for worsening symptoms.  Final Clinical Impressions(s) / UC Diagnoses   Final diagnoses:  Rash   Discharge Instructions   None    ED Prescriptions   None    PDMP not reviewed this encounter.   Volney American, [place] 07/02/22 1406

## 2022-07-02 NOTE — ED Triage Notes (Addendum)
Pt reports right forearm itching, redness and swelling. Pt reports has various other places that come and go on neck and left foot for last several days. Denies any new self care products.  Has tried hydrocortisone cream and triamcinolone ointment with some improvement in symptoms.

## 2022-07-05 DIAGNOSIS — Z4421 Encounter for fitting and adjustment of artificial right eye: Secondary | ICD-10-CM | POA: Diagnosis not present

## 2022-07-17 ENCOUNTER — Telehealth: Payer: Self-pay | Admitting: Family Medicine

## 2022-07-17 NOTE — Telephone Encounter (Signed)
Patient came by office in regard to upcoming tele visit . Is looking to speak with someone in regard.  Patient is looking for in office appt, none available today. Let patient know that upcoming appt could be changed to in office patient did not want that either.  Patient would like a call back to speak with someone in regards.

## 2022-07-18 NOTE — Telephone Encounter (Signed)
Aware that her visit would be telephone tomorrow at 11:40. Pt understands

## 2022-07-19 ENCOUNTER — Encounter: Payer: Self-pay | Admitting: Family Medicine

## 2022-07-19 ENCOUNTER — Ambulatory Visit (INDEPENDENT_AMBULATORY_CARE_PROVIDER_SITE_OTHER): Payer: Medicare PPO | Admitting: Family Medicine

## 2022-07-19 DIAGNOSIS — Z91018 Allergy to other foods: Secondary | ICD-10-CM | POA: Diagnosis not present

## 2022-07-19 DIAGNOSIS — L239 Allergic contact dermatitis, unspecified cause: Secondary | ICD-10-CM | POA: Diagnosis not present

## 2022-07-19 NOTE — Patient Instructions (Signed)
F/u in septemebr as before, call if you need me sooner  You are referred to Allergist in [place] for testing , their office will contact you  Thanks for choosing [facility], we consider it a privelige to serve you.

## 2022-07-19 NOTE — Assessment & Plan Note (Signed)
Recurrent skin erruptions x 4 weeks which patient feelsis likely due to food allergy, refer for testing

## 2022-07-19 NOTE — Progress Notes (Signed)
Virtual Visit via Video Note  I connected with Kelsey Nelson on 07/19/22 at 11:40 AM EDT by a video enabled telemedicine application and verified that I am speaking with the correct person using two identifiers.  Location: Patient: home Provider: office   I discussed the limitations of evaluation and management by telemedicine and the availability of in person appointments. The patient expressed understanding and agreed to proceed.  History of Present Illness: 4 week h/o intermittent skin eruptions with excessive itching, feels it is allergy related and requests referral to address the issue Denies difficulty with breathing orswallowing   Observations/Objective: There were no vitals taken for this visit. Good communication with no confusion and intact memory. Alert and oriented x 3 No signs of respiratory distress during speech   Assessment and Plan: Food allergy Recurrent skin erruptions x 4 weeks which patient feelsis likely due to food allergy, refer for testing   Follow Up Instructions:    I discussed the assessment and treatment plan with the patient. The patient was provided an opportunity to ask questions and all were answered. The patient agreed with the plan and demonstrated an understanding of the instructions.   The patient was advised to call back or seek an in-person evaluation if the symptoms worsen or if the condition fails to improve as anticipated.  I provided 8 minutes of non-face-to-face time during this encounter.   Tula Nakayama, MD

## 2022-08-01 ENCOUNTER — Ambulatory Visit (INDEPENDENT_AMBULATORY_CARE_PROVIDER_SITE_OTHER): Payer: Medicare PPO | Admitting: Family Medicine

## 2022-08-01 ENCOUNTER — Encounter: Payer: Self-pay | Admitting: Family Medicine

## 2022-08-01 VITALS — BP 140/84 | HR 78 | Ht 65.5 in | Wt 127.1 lb

## 2022-08-01 DIAGNOSIS — T7840XA Allergy, unspecified, initial encounter: Secondary | ICD-10-CM | POA: Insufficient documentation

## 2022-08-01 MED ORDER — CETIRIZINE HCL 5 MG PO TABS: 5.0000 mg | ORAL_TABLET | Freq: Every day | ORAL | 0 refills | Status: DC

## 2022-08-01 MED ORDER — PREDNISONE 10 MG (21) PO TBPK: ORAL_TABLET | ORAL | 0 refills | Status: DC

## 2022-08-01 NOTE — Patient Instructions (Addendum)
I appreciate the opportunity to provide care to you today!    Please pick up your mediation at the pharmacy   Please continue to a heart-healthy diet and increase your physical activities. Try to exercise for 36mns at least three times a week.      It was a pleasure to see you and I look forward to continuing to work together on your health and well-being. Please do not hesitate to call the office if you need care or have questions about your care.   Have a wonderful day and week. With Gratitude, GAlvira MondayMSN, FNP-BC

## 2022-08-01 NOTE — Progress Notes (Signed)
Established Patient Office Visit  Subjective:  Patient ID: SRAH AKE, female    DOB: 1934/12/23  Age: 88 y.o. MRN: [id number]  CC:  Chief Complaint  Patient presents with   Rash    Thinks she is having an allergic reaction, states she has welts on her skin, onset of sx began a month ago, unsure what she is allergic to, has an appt with Allergy specialist in September, needs something to help relieve some of the itching until then.      HPI Kelsey Nelson is a 88 y.o. female With past medical history of HTN presents with c/o of generalized itching and urticaria. She notes having generalized itching for a month, but it has intensified since 07/31/22. She voices eating at a resturant on 07/31/22 and trying a new salad dressing. Since then, her itching has intensified with the onset of urticaria. She denies swelling of her tongue and throat, difficulty breathing and swallowing. She is in no acute distress.   Past Medical History:  Diagnosis Date   Acute cystitis with hematuria 10/26/2013   Allergic rhinitis 11/08/2017   Blindness of right eye    BMI between 19-24,adult 2010 145 LBS   Diverticula of small intestine Dec 2010   Diverticulosis    [place]   DIVERTICULOSIS, SIGMOID COLON 03/31/2008   Qualifier: Diagnosis of  By: Dierdre Harness  Noted on abdominal and pelvic scan in 04/2011.  Extensive colonic diverticulosis , and possible muscular hypertrophy at rectosigmoid junction    Family hx of colon cancer 11/09/2013   Functional constipation 2010   GERD (gastroesophageal reflux disease)    HTN (hypertension)    Hyperlipemia    Pancreatic cyst 02/05/2013   Renal cyst 05/27/2011   Right eye trauma 1949   Loss of vision resulted at age 51   Schatzki's ring Dec 2010   Skin lesions, generalized 06/19/2015   Tubular adenoma 11/09/2013    Past Surgical History:  Procedure Laterality Date   ABDOMINAL HYSTERECTOMY     BREAST REDUCTION SURGERY  1992 BIL   COLONOSCOPY  DEC 2010   SIMPLE ADENOMAS  (6-8), [place] TICS, SML IH   COLONOSCOPY N/A 12/08/2013   Procedure: COLONOSCOPY;  Surgeon: Rogene Houston, MD;  [facility];  Service: Endoscopy;  Laterality: N/A;  225   EVISCERATION Right 05/08/2015   Procedure: EVISCERATION WITH IMPLANT RIGHT EYE;  Surgeon: Williams Che, MD;  Location: [facility];  Service: Ophthalmology;  Laterality: Right;   EYE SURGERY  RIGHT 2005   EYE SURGERY  right 2016   removed and false eye placed in 08/2015   TOTAL ABDOMINAL HYSTERECTOMY W/ BILATERAL SALPINGOOPHORECTOMY  1985 FIBROIDS   UPPER GASTROINTESTINAL ENDOSCOPY  DEC 2010   Bx-MILD GASTRITIS/DUODENITIS, DUODENAL DIVERTICULA, SCHATZKI'S RING    Family History  Problem Relation Age of Onset   Pancreatic cancer Sister    Colon cancer Paternal Aunt    Colon polyps Neg Hx     Social History   Socioeconomic History   Marital status: Married    Spouse name: [name]    Number of children: 3   Years of education: 12   Highest education level: 12th grade  Occupational History   Occupation: retired   Tobacco Use   Smoking status: Never   Smokeless tobacco: Never  Vaping Use   Vaping Use: Never used  Substance and Sexual Activity   Alcohol use: No    Alcohol/week: 0.0 standard drinks of alcohol   Drug use:  No   Sexual activity: Not Currently  Other Topics Concern   Not on file  Social History Narrative   MARRIED TO DAVID Alcorn. 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/   Social Determinants of Health   Financial Resource Strain: Low Risk  (01/22/2022)   Overall Financial Resource Strain (CARDIA)    Difficulty of Paying Living Expenses: Not hard at all  Food Insecurity: No Food Insecurity (01/22/2022)   Hunger Vital Sign    Worried About Running Out of Food in the Last Year: Never true    Ran Out of Food in the Last Year: Never true  Transportation Needs: No Transportation Needs (01/22/2022)   PRAPARE - Hydrologist (Medical): No    Lack of Transportation (Non-Medical):  No  Physical Activity: Sufficiently Active (01/22/2022)   Exercise Vital Sign    Days of Exercise per Week: 5 days    Minutes of Exercise per Session: 60 min  Stress: No Stress Concern Present (01/22/2022)   [place]    Feeling of Stress : Not at all  Social Connections: [place] (01/22/2022)   Social Connection and Isolation Panel [NHANES]    Frequency of Communication with Friends and Family: Never    Frequency of Social Gatherings with Friends and Family: More than three times a week    Attends Religious Services: More than 4 times per year    Active Member of [employer] or Organizations: Yes    Attends Music therapist: More than 4 times per year    Marital Status: Married  Human resources officer Violence: Not At Risk (01/22/2022)   Humiliation, Afraid, Rape, and Kick questionnaire    Fear of Current or Ex-Partner: No    Emotionally Abused: No    Physically Abused: No    Sexually Abused: No    Outpatient Medications Prior to Visit  Medication Sig Dispense Refill   B Complex Vitamins (VITAMIN-B COMPLEX PO) Take 1 tablet by mouth daily.     Calcium Carbonate Antacid (TUMS PO) Take 1 tablet by mouth 3 (three) times daily as needed.     Cholecalciferol (VITAMIN D3 PO) Take 25 mcg by mouth daily.     conjugated estrogens (PREMARIN) vaginal cream Apply one fingertip to introitus every 2 days. 42.5 g 5   famotidine (PEPCID) 20 MG tablet Take 1 tablet (20 mg total) by mouth daily before supper.     ondansetron (ZOFRAN-ODT) 4 MG disintegrating tablet Take 1 tablet (4 mg total) by mouth every 8 (eight) hours as needed for nausea or vomiting. 20 tablet 0   OVER THE COUNTER MEDICATION Multi Collagen Protein - Patient states that she takes 1 scoop a day.     polyethylene glycol powder (GLYCOLAX/MIRALAX) 17 GM/SCOOP powder Take 17 g by mouth daily as needed. 3350 g 1   Probiotic Product (PROBIOTIC DAILY PO) Take 1  tablet by mouth daily.     ramipril (ALTACE) 10 MG capsule TAKE (1) CAPSULE BY MOUTH ONCE DAILY. 90 capsule 0   vitamin C (ASCORBIC ACID) 500 MG tablet Take 500 mg by mouth daily.     Zinc 50 MG TABS Take 1 tablet by mouth daily.     No facility-administered medications prior to visit.    Allergies  Allergen Reactions   Codeine    Crestor [Rosuvastatin] Other (See Comments)    Can't recall   Fosamax [Alendronate Sodium]     heartburn    ROS Review  of Systems  Constitutional:  Negative for chills and fever.  HENT:  Negative for drooling, rhinorrhea and sneezing.   Respiratory:  Negative for chest tightness and shortness of breath.   Cardiovascular:  Negative for chest pain and palpitations.  Gastrointestinal:  Negative for nausea and vomiting.      Objective:    Physical Exam Cardiovascular:     Rate and Rhythm: Normal rate and regular rhythm.     Pulses: Normal pulses.     Heart sounds: Normal heart sounds.  Pulmonary:     Effort: Pulmonary effort is normal.     Breath sounds: Normal breath sounds.  Skin:    Findings: Rash present.     Comments: Urticaria noted on arms back and neck  Neurological:     Mental Status: She is alert.     BP (!) 140/84 (BP Location: Left Arm)   Pulse 78   Ht 5' 5.5" (1.664 m)   Wt 127 lb 1.9 oz (57.7 kg)   SpO2 97%   BMI 20.83 kg/m  Wt Readings from Last 3 Encounters:  08/01/22 127 lb 1.9 oz (57.7 kg)  07/01/22 128 lb 3.2 oz (58.2 kg)  06/21/22 125 lb (56.7 kg)    Lab Results  Component Value Date   TSH 3.180 07/27/2021   Lab Results  Component Value Date   WBC 5.9 07/27/2021   HGB 13.8 07/27/2021   HCT 41.4 07/27/2021   MCV 90 07/27/2021   PLT 253 07/27/2021   Lab Results  Component Value Date   NA 143 01/15/2022   K 4.5 01/15/2022   CO2 28 01/15/2022   GLUCOSE 94 01/15/2022   BUN 15 01/15/2022   CREATININE 0.86 01/15/2022   BILITOT 0.3 01/15/2022   ALKPHOS 137 (H) 01/15/2022   AST 24 01/15/2022   ALT 19  01/15/2022   PROT 6.7 01/15/2022   ALBUMIN 4.2 01/15/2022   CALCIUM 9.7 01/15/2022   ANIONGAP 8 12/04/2018   EGFR 66 01/15/2022   Lab Results  Component Value Date   CHOL 279 (H) 01/15/2022   Lab Results  Component Value Date   HDL 87 01/15/2022   Lab Results  Component Value Date   LDLCALC 167 (H) 01/15/2022   Lab Results  Component Value Date   TRIG 143 01/15/2022   Lab Results  Component Value Date   CHOLHDL 3.2 01/15/2022   Lab Results  Component Value Date   HGBA1C 5.6 09/03/2016      Assessment & Plan:   Problem List Items Addressed This Visit       Other   Acute allergic reaction - Primary    Pt c/o of generalized pruritus with urticaria She is in no acute distress Will order cetirizine and steroid taper She reports following up with an allergist on 08/29/22      Relevant Medications   predniSONE (STERAPRED UNI-PAK 21 TAB) 10 MG (21) TBPK tablet   cetirizine (ZYRTEC) 5 MG tablet    Meds ordered this encounter  Medications   predniSONE (STERAPRED UNI-PAK 21 TAB) 10 MG (21) TBPK tablet    Sig: Please follow the dosage instructions provided.    Dispense:  21 tablet    Refill:  0   cetirizine (ZYRTEC) 5 MG tablet    Sig: Take 1 tablet (5 mg total) by mouth daily for 14 days.    Dispense:  14 tablet    Refill:  0    Follow-up: Return if symptoms worsen or fail to improve.  Alvira Monday, FNP

## 2022-08-01 NOTE — Assessment & Plan Note (Addendum)
Pt c/o of generalized pruritus with urticaria She is in no acute distress Will order cetirizine and steroid taper She reports following up with an allergist on 08/29/22

## 2022-08-06 ENCOUNTER — Telehealth: Payer: Self-pay | Admitting: Family Medicine

## 2022-08-06 ENCOUNTER — Other Ambulatory Visit: Payer: Self-pay | Admitting: Family Medicine

## 2022-08-06 DIAGNOSIS — L299 Pruritus, unspecified: Secondary | ICD-10-CM

## 2022-08-06 DIAGNOSIS — T7840XA Allergy, unspecified, initial encounter: Secondary | ICD-10-CM

## 2022-08-06 MED ORDER — DIPHENHYDRAMINE HCL 25 MG PO CAPS: 25.0000 mg | ORAL_CAPSULE | Freq: Four times a day (QID) | ORAL | 0 refills | Status: DC | PRN

## 2022-08-06 NOTE — Telephone Encounter (Signed)
Please ask if she has completed the steroids and zyrtec

## 2022-08-06 NOTE — Telephone Encounter (Signed)
Kelsey Nelson Kelsey Nelson but seen Kelsey Nelson last week   Kelsey Nelson called stating she is unable to get into the allergist till 08/29/22. Kelsey Nelson states she is itching constantly in various places. She is wanting to know if she can please get something for this?   [employer]

## 2022-08-06 NOTE — Telephone Encounter (Signed)
Pt informed

## 2022-08-06 NOTE — Telephone Encounter (Signed)
Please inform the patient that I have sent her a prescription for Benadryl

## 2022-08-08 ENCOUNTER — Ambulatory Visit (INDEPENDENT_AMBULATORY_CARE_PROVIDER_SITE_OTHER): Payer: Medicare PPO | Admitting: Gastroenterology

## 2022-08-08 ENCOUNTER — Ambulatory Visit: Payer: Medicare PPO | Admitting: Physician Assistant

## 2022-08-08 VITALS — BP 126/82 | HR 82

## 2022-08-08 DIAGNOSIS — N390 Urinary tract infection, site not specified: Secondary | ICD-10-CM

## 2022-08-08 LAB — BLADDER SCAN AMB NON-IMAGING: Scan Result: 134

## 2022-08-08 MED ORDER — AMOXICILLIN-POT CLAVULANATE 875-125 MG PO TABS: 1.0000 | ORAL_TABLET | Freq: Two times a day (BID) | ORAL | 0 refills | Status: DC

## 2022-08-08 MED ORDER — TRIMETHOPRIM 100 MG PO TABS: 100.0000 mg | ORAL_TABLET | Freq: Every day | ORAL | 11 refills | Status: DC

## 2022-08-08 NOTE — Patient Instructions (Signed)
Start back on night time dose of trimethoprim after finishing treatment for active UTI

## 2022-08-08 NOTE — Progress Notes (Signed)
Assessment: 1. Urinary tract infection without hematuria, site unspecified - Urinalysis, Routine w reflex microscopic - BLADDER SCAN AMB NON-IMAGING    Plan: Based on most recent cultures, Augmentin prescribed and urine culture ordered.  We will adjust therapy if indicated by cx result.  She is advised to resume at bedtime trimethoprim as previously prescribed and new Rx given.  Written discharge instructions also given for this. Keep appt as scheduled next month for urine recheck.  Chief Complaint: No chief complaint on file.   HPI: Kelsey Nelson is a 88 y.o. female who presents for continued evaluation of recurrent UTIs.  She presents today for acute visit with complaint of 1 day history of dysuria, significant urinary pressure, burning.  Patient states she feels like "everything is falling out" this like it has for the past 3 years right now.  She stopped taking HS trimethoprim "a few months ago" because she ran out and never had it refilled. No fever, chills, nausea or vomiting.  No abdominal pain, no gross hematuria.  Mdx cx obtained on drop off urine on 04/06/2022 grew 100,000 colonies of Enterococcus and E. coli with methicillin resistance noted.  Augmentin and fosfomycin noted to be the only drugs with sensitivity  UA= 11-30 WBCs, many bacteria, nitrite negative PVR=132m  03/29/22 Kelsey Nelson [id number] for followup for recurrent UTI and OAB. UA today is concerning for infection. She had a positive urine culture 1/31 which was not treated.  She has dysuria, urinary frequency, urgency and worsening incontinence. No hematuria. No other complaints today  Portions of the above documentation were copied from a prior visit for review purposes only.  Allergies: Allergies  Allergen Reactions   Codeine    Crestor [Rosuvastatin] Other (See Comments)    Can't recall   Fosamax [Alendronate Sodium]     heartburn    PMH: Past Medical History:  Diagnosis Date   Acute cystitis with  hematuria 10/26/2013   Allergic rhinitis 11/08/2017   Blindness of right eye    BMI between 19-24,adult 2010 145 LBS   Diverticula of small intestine Dec 2010   Diverticulosis    [place]   DIVERTICULOSIS, SIGMOID COLON 03/31/2008   Qualifier: Diagnosis of  By: BDierdre Harness Noted on abdominal and pelvic scan in 04/2011.  Extensive colonic diverticulosis , and possible muscular hypertrophy at rectosigmoid junction    Family hx of colon cancer 11/09/2013   Functional constipation 2010   GERD (gastroesophageal reflux disease)    HTN (hypertension)    Hyperlipemia    Pancreatic cyst 02/05/2013   Renal cyst 05/27/2011   Right eye trauma 1949   Loss of vision resulted at age 88  Schatzki's ring Dec 2010   Skin lesions, generalized 06/19/2015   Tubular adenoma 11/09/2013    PSH: Past Surgical History:  Procedure Laterality Date   ABDOMINAL HYSTERECTOMY     BREAST REDUCTION SURGERY  1992 BIL   COLONOSCOPY  DEC 2010   SIMPLE ADENOMAS (6-8), [place] TICS, SML IH   COLONOSCOPY N/A 12/08/2013   Procedure: COLONOSCOPY;  Surgeon: NRogene Houston MD;  [facility];  Service: Endoscopy;  Laterality: N/A;  225   EVISCERATION Right 05/08/2015   Procedure: EVISCERATION WITH IMPLANT RIGHT EYE;  Surgeon: CWilliams Che MD;  Location: [facility];  Service: Ophthalmology;  Laterality: Right;   EYE SURGERY  RIGHT 2005   EYE SURGERY  right 2016   removed and false eye placed in 08/2015   TOTAL  ABDOMINAL HYSTERECTOMY W/ BILATERAL SALPINGOOPHORECTOMY  1985 FIBROIDS   UPPER GASTROINTESTINAL ENDOSCOPY  DEC 2010   Bx-MILD GASTRITIS/DUODENITIS, DUODENAL DIVERTICULA, SCHATZKI'S RING    SH: Social History   Tobacco Use   Smoking status: Never   Smokeless tobacco: Never  Vaping Use   Vaping Use: Never used  Substance Use Topics   Alcohol use: No    Alcohol/week: 0.0 standard drinks of alcohol   Drug use: No    ROS: All other review of systems were reviewed and are negative except what is noted  above in HPI  PE: BP 126/82   Pulse 82  GENERAL APPEARANCE:  Well appearing, well developed, well nourished, NAD HEENT:  Atraumatic, normocephalic NECK:  Supple. Trachea midline ABDOMEN:  Soft, non-tender, no masses EXTREMITIES:  Moves all extremities well NEUROLOGIC:  Alert and oriented x 3 MENTAL STATUS:  appropriate BACK:  Non-tender to palpation, No CVAT SKIN:  Warm, dry, and intact   Results: Laboratory Data: Lab Results  Component Value Date   WBC 5.9 07/27/2021   HGB 13.8 07/27/2021   HCT 41.4 07/27/2021   MCV 90 07/27/2021   PLT 253 07/27/2021    Lab Results  Component Value Date   CREATININE 0.86 01/15/2022    No results found for: "PSA"  No results found for: "TESTOSTERONE"  Lab Results  Component Value Date   HGBA1C 5.6 09/03/2016    Urinalysis    Component Value Date/Time   COLORURINE STRAW (A) 12/04/2018 1058   APPEARANCEUR Hazy (A) 04/04/2022 1546   LABSPEC 1.003 (L) 12/04/2018 1058   PHURINE 7.0 12/04/2018 1058   GLUCOSEU Negative 04/04/2022 1546   HGBUR SMALL (A) 12/04/2018 1058   BILIRUBINUR Negative 04/04/2022 1546   KETONESUR negative 01/22/2022 1316   KETONESUR NEGATIVE 12/04/2018 1058   PROTEINUR Negative 04/04/2022 1546   PROTEINUR NEGATIVE 12/04/2018 1058   UROBILINOGEN 0.2 01/22/2022 1316   UROBILINOGEN 0.2 07/14/2009 2018   NITRITE Negative 04/04/2022 1546   NITRITE NEGATIVE 12/04/2018 1058   LEUKOCYTESUR 2+ (A) 04/04/2022 1546    Lab Results  Component Value Date   LABMICR See below: 04/04/2022   WBCUA 11-30 (A) 04/04/2022   LABEPIT 0-10 04/04/2022   BACTERIA Few (A) 04/04/2022    Pertinent Imaging: Results for orders placed during the hospital encounter of 07/31/12  DG Abd 1 View  Narrative *RADIOLOGY REPORT*  Clinical Data: History of constipation.  Abdominal pain.  ABDOMEN - 1 VIEW  Comparison: 06/21/2011  Findings: Moderate stool burden within the colon, slightly decreased in the left colon since prior  study.  No evidence of bowel obstruction.  No free air.  No organomegaly or acute bony abnormality.  Calcified phleboliths in the anatomic pelvis.  IMPRESSION: Moderate stool burden, decreased from prior study.  No acute findings.  Original Report Authenticated By: Raelyn Number, M.D.  No results found for this or any previous visit.  No results found for this or any previous visit.  No results found for this or any previous visit.  No results found for this or any previous visit.  No results found for this or any previous visit.  No results found for this or any previous visit.  No results found for this or any previous visit.  Results for orders placed or performed in visit on 08/08/22 (from the past 24 hour(s))  BLADDER SCAN AMB NON-IMAGING   Collection Time: 08/08/22  2:17 PM  Result Value Ref Range   Scan Result 134

## 2022-08-09 ENCOUNTER — Telehealth: Payer: Self-pay

## 2022-08-09 ENCOUNTER — Other Ambulatory Visit: Payer: Self-pay | Admitting: Family Medicine

## 2022-08-09 LAB — MICROSCOPIC EXAMINATION

## 2022-08-09 LAB — URINALYSIS, ROUTINE W REFLEX MICROSCOPIC
Bilirubin, UA: NEGATIVE
Glucose, UA: NEGATIVE
Ketones, UA: NEGATIVE
Nitrite, UA: NEGATIVE
Protein,UA: NEGATIVE
Specific Gravity, UA: <1.005 — ABNORMAL LOW (ref 1.005–1.030)
Urobilinogen, Ur: 0.2 mg/dL (ref 0.2–1.0)
pH, UA: 6.5 (ref 5.0–7.5)

## 2022-08-09 MED ORDER — PREDNISONE 5 MG PO TABS: 5.0000 mg | ORAL_TABLET | Freq: Two times a day (BID) | ORAL | 0 refills | Status: AC

## 2022-08-09 NOTE — Progress Notes (Signed)
Pred 5

## 2022-08-09 NOTE — Telephone Encounter (Signed)
Patient daughter Anderson Malta called need refill  predniSONE (STERAPRED UNI-PAK 21 TAB) 10 MG (21) TBPK tablet  Low dose to last til 09.07.2023.    Please call daughter jennifer at [phone] to let her know if anything will be called in for her or not. Still having same itching problems.  Pharmacy: Pharmacy  [place], [place] - [place]  [address], [place] [address]  Phone:  [phone]  Fax:  [phone]

## 2022-08-12 ENCOUNTER — Telehealth: Payer: Self-pay

## 2022-08-12 LAB — URINE CULTURE

## 2022-08-12 NOTE — Telephone Encounter (Signed)
-----   Message from Reynaldo Minium, [place] sent at 08/12/2022 10:55 AM EDT ----- Please let the patient know that her culture is positive, but the Augmentin she is on should cover the bacteria well. ----- Message ----- From: Interface, [facility] Lab Results In Sent: 08/09/2022  10:36 AM EDT To: Reynaldo Minium, PA-C

## 2022-08-12 NOTE — Telephone Encounter (Signed)
Patient aware to complete antibiotic and f/u as scheduled.

## 2022-08-16 DIAGNOSIS — H26492 Other secondary cataract, left eye: Secondary | ICD-10-CM | POA: Diagnosis not present

## 2022-08-16 DIAGNOSIS — Z961 Presence of intraocular lens: Secondary | ICD-10-CM | POA: Diagnosis not present

## 2022-08-16 DIAGNOSIS — H16222 Keratoconjunctivitis sicca, not specified as Sjogren's, left eye: Secondary | ICD-10-CM | POA: Diagnosis not present

## 2022-08-16 DIAGNOSIS — H0102B Squamous blepharitis left eye, upper and lower eyelids: Secondary | ICD-10-CM | POA: Diagnosis not present

## 2022-08-16 DIAGNOSIS — H0102A Squamous blepharitis right eye, upper and lower eyelids: Secondary | ICD-10-CM | POA: Diagnosis not present

## 2022-08-16 DIAGNOSIS — H40052 Ocular hypertension, left eye: Secondary | ICD-10-CM | POA: Diagnosis not present

## 2022-08-27 ENCOUNTER — Other Ambulatory Visit: Payer: Self-pay

## 2022-08-27 MED ORDER — RAMIPRIL 10 MG PO CAPS: ORAL_CAPSULE | ORAL | 0 refills | Status: DC

## 2022-08-28 ENCOUNTER — Other Ambulatory Visit: Payer: Self-pay

## 2022-08-28 DIAGNOSIS — N390 Urinary tract infection, site not specified: Secondary | ICD-10-CM

## 2022-08-28 MED ORDER — TRIMETHOPRIM 100 MG PO TABS: 100.0000 mg | ORAL_TABLET | Freq: Every day | ORAL | 11 refills | Status: DC

## 2022-08-29 ENCOUNTER — Encounter: Payer: Self-pay | Admitting: Allergy & Immunology

## 2022-08-29 ENCOUNTER — Ambulatory Visit: Payer: Medicare PPO | Admitting: Allergy & Immunology

## 2022-08-29 VITALS — BP 124/78 | HR 82 | Temp 98.7°F | Resp 16 | Ht 65.5 in | Wt 126.8 lb

## 2022-08-29 DIAGNOSIS — L508 Other urticaria: Secondary | ICD-10-CM

## 2022-08-29 DIAGNOSIS — J3089 Other allergic rhinitis: Secondary | ICD-10-CM | POA: Diagnosis not present

## 2022-08-29 DIAGNOSIS — K219 Gastro-esophageal reflux disease without esophagitis: Secondary | ICD-10-CM | POA: Diagnosis not present

## 2022-08-29 MED ORDER — FAMOTIDINE 20 MG PO TABS: 20.0000 mg | ORAL_TABLET | Freq: Two times a day (BID) | ORAL | 3 refills | Status: DC

## 2022-08-29 MED ORDER — FEXOFENADINE HCL 180 MG PO TABS: 180.0000 mg | ORAL_TABLET | Freq: Every morning | ORAL | 3 refills | Status: DC

## 2022-08-29 MED ORDER — CETIRIZINE HCL 10 MG PO TABS: 10.0000 mg | ORAL_TABLET | Freq: Every day | ORAL | 3 refills | Status: DC

## 2022-08-29 NOTE — Progress Notes (Signed)
NEW PATIENT  Date of Service/Encounter:  08/29/22  Consult requested by: Fayrene Helper, MD   Assessment:   Chronic urticaria - getting labs today (consider Xolair in the future)  Seasonal allergic rhinitis  GERD   Preference to avoid daily medications  Plan/Recommendations:   1. Chronic urticaria - Your history does not have any "red flags" such as fevers, joint pains, or permanent skin changes that would be concerning for a more serious cause of hives.  - We will get some labs to rule out serious causes of hives: complete blood count, tryptase level, chronic urticaria panel, CMP, ESR, and CRP. - We will call you in 1-2 weeks with the results of the testing.  - Chronic hives are often times a self limited process and will "burn themselves out" over 6-12 months, although this is not always the case.  - In the meantime, start suppressive dosing of antihistamines:   - Morning: Allegra (fexofenadine) 180mg  (one tablet) + Pepcid (famotidine) 20mg    - Evening: Zyrtec (cetirizine) 10mg  (one tablet) + Pepcid (famotidine) 20mg  - You can change this dosing at home, decreasing the dose as needed or increasing the dosing as needed.  - If you are not tolerating the medications or are tired of taking them every day, we can start treatment with a monthly injectable medication called Xolair.  - Alternatively, we can figure out what is triggering your hives and help with avoidance.   2. Seasonal allergic rhinitis - We are going to get some environmental allergy testing via the blood to see what might be triggering your allergy symptoms. - The antihistamines should be helping with these symptoms when they happen.  3. Return in about 2 months (around 10/29/2022).    This note in its entirety was forwarded to the Provider who requested this consultation.  Subjective:   Kelsey Nelson is a 88 y.o. female presenting today for evaluation of  Chief Complaint  Patient presents with    Allergy Testing    Patient has been taking Allegra nightly - was not advised to stop taking antihistamine 3 dys prior to appt. Environmental: All except cat/dog (Fine)  Food: ?????    Kelsey Nelson has a history of the following: Patient Active Problem List   Diagnosis Date Noted   Acute allergic reaction 08/01/2022   Allergic dermatitis 07/19/2022   Food allergy 07/19/2022   Rash and other nonspecific skin eruption 06/21/2022   Sinusitis 06/03/2022   Skin lesions 01/22/2022   Abdominal spasms 01/22/2022   Blepharitis of left lower eyelid 10/28/2021   Recurrent UTI 05/10/2021   Skipped heart beats 10/19/2020   Dermatitis 07/31/2020   COVID-19 virus infection 02/25/2020   Anterior knee pain, right 10/05/2019   Belching symptom 10/05/2019   Injury of left knee 11/28/2017   Annual physical exam 03/23/2016   Ectropion of right eye 11/22/2015   Acquired superior sulcus deformity of orbit 07/04/2015   History of evisceration of eye 07/04/2015   Nuclear sclerosis of left eye 07/04/2015   Urinary urgency 10/31/2013   Loss of taste 07/31/2012   Pancreatic cyst 04/21/2012   H/O gastroesophageal reflux (GERD) 04/21/2012   Nocturia 12/02/2011   Insomnia 09/20/2011   Nausea 06/22/2011   Constipation 06/21/2011   GERD 11/20/2009   Mixed hyperlipidemia 03/31/2008   BLINDNESS, RIGHT EYE 03/31/2008   HTN (hypertension) 03/31/2008   Osteoporosis 03/31/2008    History obtained from: chart review and patient.  [name] was referred by Tula Nakayama  E, MD.     Kelsey Nelson is a 88 y.o. female presenting for an evaluation of urticaria and itching . She reports that she has been itching for over one month. She does not remember the day that it started but was likely around 4-5 weeks. She denies any new exposures. She tried praying as well as Allegra as well as prednisone. She was on the prednisone which did help, but obliviously she cannot remain on that. She does report that she had  whelps over her entire body. She has the worst ones on her arms and hands. She has tried Human resources officer twice daily as well.   She has not had fevers with this at all. She has has not coughing or throat swelling. She denies new foods at all. She uses a lot of seasonings in her food, but otherwise now new exposures. She has used seasonings that she always uses. She even cut out chocolate. This did not help. [name] is a favorite of hers.   She has not gotten bit by a tick recently. She does not spend a lot of time outdoors. She denies diarrhea or vomiting. It will get better at times, but this is just momentarily. She has never had this previously.  Allergic Rhinitis Symptom History: She reports some rhinorrhea in the spring time. It was not as bad overall.  She has never been allergy tested.  She has never been on shots.  GERD Symptom History: She has GERD and takes Tums as needed. She takes Pepcid as needed.   Otherwise, there is no history of other atopic diseases, including asthma, food allergies, drug allergies, stinging insect allergies, eczema, or contact dermatitis. There is no significant infectious history. Vaccinations are up to date.    Past Medical History: Patient Active Problem List   Diagnosis Date Noted   Acute allergic reaction 08/01/2022   Allergic dermatitis 07/19/2022   Food allergy 07/19/2022   Rash and other nonspecific skin eruption 06/21/2022   Sinusitis 06/03/2022   Skin lesions 01/22/2022   Abdominal spasms 01/22/2022   Blepharitis of left lower eyelid 10/28/2021   Recurrent UTI 05/10/2021   Skipped heart beats 10/19/2020   Dermatitis 07/31/2020   COVID-19 virus infection 02/25/2020   Anterior knee pain, right 10/05/2019   Belching symptom 10/05/2019   Injury of left knee 11/28/2017   Annual physical exam 03/23/2016   Ectropion of right eye 11/22/2015   Acquired superior sulcus deformity of orbit 07/04/2015   History of evisceration of eye 07/04/2015   Nuclear  sclerosis of left eye 07/04/2015   Urinary urgency 10/31/2013   Loss of taste 07/31/2012   Pancreatic cyst 04/21/2012   H/O gastroesophageal reflux (GERD) 04/21/2012   Nocturia 12/02/2011   Insomnia 09/20/2011   Nausea 06/22/2011   Constipation 06/21/2011   GERD 11/20/2009   Mixed hyperlipidemia 03/31/2008   BLINDNESS, RIGHT EYE 03/31/2008   HTN (hypertension) 03/31/2008   Osteoporosis 03/31/2008    Medication List:  Allergies as of 08/29/2022       Reactions   Codeine    Crestor [rosuvastatin] Other (See Comments)   Can't recall   Fosamax [alendronate Sodium]    heartburn        Medication List        Accurate as of August 29, 2022  3:26 PM. If you have any questions, ask your nurse or doctor.          STOP taking these medications    amoxicillin-clavulanate 875-125 MG tablet Commonly known as:  AUGMENTIN Stopped by: Valentina Shaggy, MD   predniSONE 10 MG (21) Tbpk tablet Commonly known as: STERAPRED UNI-PAK 21 TAB Stopped by: Valentina Shaggy, MD       TAKE these medications    ascorbic acid 500 MG tablet Commonly known as: VITAMIN C Take 500 mg by mouth daily.   cetirizine 5 MG tablet Commonly known as: ZYRTEC Take 1 tablet (5 mg total) by mouth daily for 14 days.   conjugated estrogens 0.625 MG/GM vaginal cream Commonly known as: PREMARIN Apply one fingertip to introitus every 2 days.   diphenhydrAMINE 25 mg capsule Commonly known as: Benadryl Allergy Take 1 capsule (25 mg total) by mouth every 6 (six) hours as needed.   famotidine 20 MG tablet Commonly known as: Pepcid Take 1 tablet (20 mg total) by mouth daily before supper.   ondansetron 4 MG disintegrating tablet Commonly known as: ZOFRAN-ODT Take 1 tablet (4 mg total) by mouth every 8 (eight) hours as needed for nausea or vomiting.   OVER THE COUNTER MEDICATION Multi Collagen Protein - Patient states that she takes 1 scoop a day.   polyethylene glycol powder 17 GM/SCOOP  powder Commonly known as: GLYCOLAX/MIRALAX Take 17 g by mouth daily as needed.   pravastatin 10 MG tablet Commonly known as: PRAVACHOL Take 1 tablet by mouth daily at 12 noon.   PROBIOTIC DAILY PO Take 1 tablet by mouth daily.   ramipril 10 MG capsule Commonly known as: ALTACE TAKE (1) CAPSULE BY MOUTH ONCE DAILY.   triamcinolone cream 0.1 % Commonly known as: KENALOG Apply 1 Application topically as directed.   trimethoprim 100 MG tablet Commonly known as: TRIMPEX Take 1 tablet (100 mg total) by mouth at bedtime. For prevention of UTIs   TUMS PO Take 1 tablet by mouth 3 (three) times daily as needed.   VITAMIN D3 PO Take 25 mcg by mouth daily.   VITAMIN-B COMPLEX PO Take 1 tablet by mouth daily.   Zinc 50 MG Tabs Take 1 tablet by mouth daily.        Birth History: non-contributory  Developmental History: non-contributory  Past Surgical History: Past Surgical History:  Procedure Laterality Date   ABDOMINAL HYSTERECTOMY     BREAST REDUCTION SURGERY  1992 BIL   COLONOSCOPY  DEC 2010   SIMPLE ADENOMAS (6-8), [place] TICS, SML IH   COLONOSCOPY N/A 12/08/2013   Procedure: COLONOSCOPY;  Surgeon: Rogene Houston, MD;  Location: [facility];  Service: Endoscopy;  Laterality: N/A;  225   EVISCERATION Right 05/08/2015   Procedure: EVISCERATION WITH IMPLANT RIGHT EYE;  Surgeon: Williams Che, MD;  Location: [facility];  Service: Ophthalmology;  Laterality: Right;   EYE SURGERY  RIGHT 2005   EYE SURGERY  right 2016   removed and false eye placed in 08/2015   TOTAL ABDOMINAL HYSTERECTOMY W/ BILATERAL SALPINGOOPHORECTOMY  1985 FIBROIDS   UPPER GASTROINTESTINAL ENDOSCOPY  DEC 2010   Bx-MILD GASTRITIS/DUODENITIS, DUODENAL DIVERTICULA, SCHATZKI'S RING     Family History: Family History  Problem Relation Age of Onset   Pancreatic cancer Sister    Colon cancer Paternal Aunt    Colon polyps Neg Hx      Social History: Heily lives at home with husband. She is married to  her husband of nearly 21 years at the end of September. She has 4 grandchildren and 8 great-grandchildren.  They live in a house that is 47 years old.  There is hardwood throughout the home.  They have  gas heating and central cooling.  There are no animals inside or outside of the home.  There are no dust mite covers on the bedding.  There is no tobacco exposure.  They do not use a HEPA filter in the home.  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   Review of Systems  Constitutional: Negative.  Negative for chills, fever, malaise/fatigue and weight loss.  HENT: Negative.  Negative for congestion, ear discharge, ear pain and sinus pain.   Eyes:  Negative for pain, discharge and redness.  Respiratory:  Negative for cough, sputum production, shortness of breath and wheezing.   Cardiovascular: Negative.  Negative for chest pain and palpitations.  Gastrointestinal:  Negative for abdominal pain, constipation, diarrhea, heartburn, nausea and vomiting.  Skin:  Positive for itching and rash.  Neurological:  Negative for dizziness and headaches.  Endo/Heme/Allergies:  Negative for environmental allergies. Does not bruise/bleed easily.       Objective:   Blood pressure 124/78, pulse 82, temperature 98.7 F (37.1 C), resp. rate 16, height 5' 5.5" (1.664 m), weight 126 lb 12.8 oz (57.5 kg), SpO2 96 %. Body mass index is 20.78 kg/m.     Physical Exam Vitals reviewed.  Constitutional:      Appearance: She is well-developed.     Comments: Pleasant and talkative.  HENT:     Head: Normocephalic and atraumatic.     Right Ear: Tympanic membrane, ear canal and external ear normal. No drainage, swelling or tenderness. Tympanic membrane is not injected, scarred, erythematous, retracted or bulging.     Left Ear: Tympanic membrane, ear canal and external ear normal. No drainage, swelling or tenderness. Tympanic membrane is not injected, scarred, erythematous, retracted or bulging.      Nose: No nasal deformity, septal deviation, mucosal edema or rhinorrhea.     Right Turbinates: Not enlarged, swollen or pale.     Left Turbinates: Not enlarged, swollen or pale.     Right Sinus: No maxillary sinus tenderness or frontal sinus tenderness.     Left Sinus: No maxillary sinus tenderness or frontal sinus tenderness.     Mouth/Throat:     Mouth: Mucous membranes are not pale and not dry.     Pharynx: Uvula midline.  Eyes:     General:        Right eye: No discharge.        Left eye: No discharge.     Conjunctiva/sclera: Conjunctivae normal.     Right eye: Right conjunctiva is not injected. No chemosis.    Left eye: Left conjunctiva is not injected. No chemosis.    Pupils: Pupils are equal, round, and reactive to light.  Cardiovascular:     Rate and Rhythm: Normal rate and regular rhythm.     Heart sounds: Normal heart sounds.  Pulmonary:     Effort: Pulmonary effort is normal. No tachypnea, accessory muscle usage or respiratory distress.     Breath sounds: Normal breath sounds. No wheezing, rhonchi or rales.  Chest:     Chest wall: No tenderness.  Abdominal:     Tenderness: There is no abdominal tenderness. There is no guarding or rebound.  Lymphadenopathy:     Head:     Right side of head: No submandibular, tonsillar or occipital adenopathy.     Left side of head: No submandibular, tonsillar or occipital adenopathy.     Cervical: No cervical adenopathy.  Skin:    General: Skin is warm.  Capillary Refill: Capillary refill takes less than 2 seconds.     Coloration: Skin is not pale.     Findings: Rash present. No abrasion, erythema or petechiae. Rash is urticarial. Rash is not papular or vesicular.     Comments: Multiple urticaria over the arms as well as the neck.  Neurological:     Mental Status: She is alert.  Psychiatric:        Behavior: Behavior is cooperative.      Diagnostic studies: labs sent instead        Salvatore Marvel, MD Allergy and  [facility] [place] [facility]

## 2022-08-29 NOTE — Addendum Note (Signed)
Addended by: Valentina Shaggy on: 08/29/2022 03:31 PM   Modules accepted: Orders

## 2022-08-29 NOTE — Patient Instructions (Addendum)
1. Chronic urticaria - Your history does not have any "red flags" such as fevers, joint pains, or permanent skin changes that would be concerning for a more serious cause of hives.  - We will get some labs to rule out serious causes of hives: complete blood count, tryptase level, chronic urticaria panel, CMP, ESR, and CRP. - We will call you in 1-2 weeks with the results of the testing.  - Chronic hives are often times a self limited process and will "burn themselves out" over 6-12 months, although this is not always the case.  - In the meantime, start suppressive dosing of antihistamines:   - Morning: Allegra (fexofenadine) 180mg  (one tablet) + Pepcid (famotidine) 20mg    - Evening: Zyrtec (cetirizine) 10mg  (one tablet) + Pepcid (famotidine) 20mg  - You can change this dosing at home, decreasing the dose as needed or increasing the dosing as needed.  - If you are not tolerating the medications or are tired of taking them every day, we can start treatment with a monthly injectable medication called Xolair.  - Alternatively, we can figure out what is triggering your hives and help with avoidance.   2. Seasonal allergic rhinitis - We are going to get some environmental allergy testing via the blood to see what might be triggering your allergy symptoms. - The antihistamines should be helping with these symptoms when they happen.  3. Return in about 2 months (around 10/29/2022).    Please inform us of any Emergency Department visits, hospitalizations, or changes in symptoms. Call us before going to the ED for breathing or allergy symptoms since we might be able to fit you in for a sick visit. Feel free to contact us anytime with any questions, problems, or concerns.  It was a pleasure to meet you today!  Websites that have reliable patient information: 1. American Academy of Asthma, Allergy, and Immunology: www.aaaai.org 2. Food Allergy Research and Education (FARE): foodallergy.org 3. Mothers of  Asthmatics: http://www.asthmacommunitynetwork.org 4. American College of Allergy, Asthma, and Immunology: www.acaai.org   COVID-19 Vaccine Information can be found at: ShippingScam.co.uk For questions related to vaccine distribution or appointments, please email [email or web address] or call [phone].   We realize that you might be concerned about having an allergic reaction to the COVID19 vaccines. To help with that concern, WE ARE OFFERING THE COVID19 VACCINES IN OUR OFFICE! Ask the front desk for dates!     "Like" [place] on Facebook and Instagram for our latest updates!      A healthy democracy works best when [employer] participate! Make sure you are registered to vote! If you have moved or changed any of your contact information, you will need to get this updated before voting!  In some cases, you MAY be able to register to vote online: [email or web address]

## 2022-08-30 ENCOUNTER — Ambulatory Visit: Payer: Medicare PPO | Admitting: Urology

## 2022-09-01 LAB — ALPHA-GAL PANEL
Allergen Lamb IgE: <0.1 kU/L
Beef IgE: <0.1 kU/L
IgE (Immunoglobulin E), Serum: 33 IU/mL (ref 6–495)
O215-IgE Alpha-Gal: 0.29 kU/L — AB
Pork IgE: <0.1 kU/L

## 2022-09-06 LAB — ALLERGENS W/COMP RFLX AREA 2
Alternaria Alternata IgE: <0.1 kU/L
Aspergillus Fumigatus IgE: <0.1 kU/L
Bermuda Grass IgE: <0.1 kU/L
Cedar, Mountain IgE: <0.1 kU/L
Cladosporium Herbarum IgE: <0.1 kU/L
Cockroach, German IgE: 0.37 kU/L — AB
Common Silver Birch IgE: <0.1 kU/L
Cottonwood IgE: <0.1 kU/L
D Farinae IgE: <0.1 kU/L
D Pteronyssinus IgE: <0.1 kU/L
E001-IgE Cat Dander: <0.1 kU/L
E005-IgE Dog Dander: <0.1 kU/L
Elm, American IgE: <0.1 kU/L
IgE (Immunoglobulin E), Serum: 34 IU/mL (ref 6–495)
Johnson Grass IgE: <0.1 kU/L
Maple/Box Elder IgE: <0.1 kU/L
Mouse Urine IgE: <0.1 kU/L
Oak, White IgE: <0.1 kU/L
Pecan, Hickory IgE: <0.1 kU/L
Penicillium Chrysogen IgE: <0.1 kU/L
Pigweed, Rough IgE: <0.1 kU/L
Ragweed, Short IgE: <0.1 kU/L
Sheep Sorrel IgE Qn: <0.1 kU/L
Timothy Grass IgE: <0.1 kU/L
White Mulberry IgE: <0.1 kU/L

## 2022-09-06 LAB — CMP14+EGFR
ALT: 36 IU/L — ABNORMAL HIGH (ref 0–32)
AST: 25 IU/L (ref 0–40)
Albumin/Globulin Ratio: 1.9 (ref 1.2–2.2)
Albumin: 4.4 g/dL (ref 3.7–4.7)
Alkaline Phosphatase: 179 IU/L — ABNORMAL HIGH (ref 44–121)
BUN/Creatinine Ratio: 15 (ref 12–28)
BUN: 16 mg/dL (ref 8–27)
Bilirubin Total: <0.2 mg/dL (ref 0.0–1.2)
CO2: 23 mmol/L (ref 20–29)
Calcium: 10 mg/dL (ref 8.7–10.3)
Chloride: 98 mmol/L (ref 96–106)
Creatinine, Ser: 1.07 mg/dL — ABNORMAL HIGH (ref 0.57–1.00)
Globulin, Total: 2.3 g/dL (ref 1.5–4.5)
Glucose: 92 mg/dL (ref 70–99)
Potassium: 4.5 mmol/L (ref 3.5–5.2)
Sodium: 137 mmol/L (ref 134–144)
Total Protein: 6.7 g/dL (ref 6.0–8.5)
eGFR: 51 mL/min/{1.73_m2} — ABNORMAL LOW (ref >59)

## 2022-09-06 LAB — CBC WITH DIFFERENTIAL
Basophils Absolute: 0 10*3/uL (ref 0.0–0.2)
Basos: 1 %
EOS (ABSOLUTE): 0.2 10*3/uL (ref 0.0–0.4)
Eos: 3 %
Hematocrit: 40.7 % (ref 34.0–46.6)
Hemoglobin: 13.3 g/dL (ref 11.1–15.9)
Immature Grans (Abs): 0 10*3/uL (ref 0.0–0.1)
Immature Granulocytes: 0 %
Lymphocytes Absolute: 1.3 10*3/uL (ref 0.7–3.1)
Lymphs: 23 %
MCH: 30.2 pg (ref 26.6–33.0)
MCHC: 32.7 g/dL (ref 31.5–35.7)
MCV: 93 fL (ref 79–97)
Monocytes Absolute: 0.5 10*3/uL (ref 0.1–0.9)
Monocytes: 9 %
Neutrophils Absolute: 3.6 10*3/uL (ref 1.4–7.0)
Neutrophils: 64 %
RBC: 4.4 x10E6/uL (ref 3.77–5.28)
RDW: 12.7 % (ref 11.7–15.4)
WBC: 5.7 10*3/uL (ref 3.4–10.8)

## 2022-09-06 LAB — ANTINUCLEAR ANTIBODIES, IFA: ANA Titer 1: NEGATIVE

## 2022-09-06 LAB — TRYPTASE: Tryptase: 9.8 ug/L (ref 2.2–13.2)

## 2022-09-06 LAB — THYROID ANTIBODIES
Thyroglobulin Antibody: <1 IU/mL (ref 0.0–0.9)
Thyroperoxidase Ab SerPl-aCnc: 16 IU/mL (ref 0–34)

## 2022-09-06 LAB — CHRONIC URTICARIA: cu index: 5.6 (ref <10)

## 2022-09-06 LAB — SEDIMENTATION RATE: Sed Rate: 14 mm/hr (ref 0–40)

## 2022-09-06 LAB — C-REACTIVE PROTEIN: CRP: 1 mg/L (ref 0–10)

## 2022-09-10 DIAGNOSIS — H26492 Other secondary cataract, left eye: Secondary | ICD-10-CM | POA: Diagnosis not present

## 2022-09-11 ENCOUNTER — Ambulatory Visit: Payer: Medicare PPO | Admitting: Family Medicine

## 2022-09-11 ENCOUNTER — Telehealth: Payer: Self-pay

## 2022-09-11 NOTE — Telephone Encounter (Signed)
We could send in a short prednisone burst. We could also look into start Xolair.   Salvatore Marvel, MD Allergy and [facility]

## 2022-09-11 NOTE — Telephone Encounter (Signed)
Patient called back regarding lab results.  I informed her that I didn't have a free nurse. Patient is okay with the house phone being called.

## 2022-09-11 NOTE — Telephone Encounter (Signed)
Called and spoke with patient in regards to lab results. Patient states that she is still miserable with itching, she did confirm that she is still taking Allegra and Pepcid in the morning and Zyrtec and Pepcid at night and has not had any less relief with the itching. She states that she did not eat much red meat to begin with but that she will make sure to completely avoid and will call in 2 weeks with an update. Do you have any further recommendation to help with her pruritis?

## 2022-09-17 DIAGNOSIS — E559 Vitamin D deficiency, unspecified: Secondary | ICD-10-CM | POA: Diagnosis not present

## 2022-09-17 DIAGNOSIS — I10 Essential (primary) hypertension: Secondary | ICD-10-CM | POA: Diagnosis not present

## 2022-09-17 DIAGNOSIS — E785 Hyperlipidemia, unspecified: Secondary | ICD-10-CM | POA: Diagnosis not present

## 2022-09-17 NOTE — Telephone Encounter (Signed)
I love the term "pop up visit"! It seems that we are in a holding pattern at this time.   Salvatore Marvel, MD Allergy and [facility] [place]

## 2022-09-18 ENCOUNTER — Encounter: Payer: Self-pay | Admitting: Family Medicine

## 2022-09-18 ENCOUNTER — Ambulatory Visit (INDEPENDENT_AMBULATORY_CARE_PROVIDER_SITE_OTHER): Payer: Medicare PPO | Admitting: Family Medicine

## 2022-09-18 VITALS — BP 112/65 | HR 85 | Ht 65.0 in | Wt 126.0 lb

## 2022-09-18 DIAGNOSIS — Z23 Encounter for immunization: Secondary | ICD-10-CM

## 2022-09-18 DIAGNOSIS — N1831 Chronic kidney disease, stage 3a: Secondary | ICD-10-CM | POA: Diagnosis not present

## 2022-09-18 DIAGNOSIS — F5104 Psychophysiologic insomnia: Secondary | ICD-10-CM | POA: Diagnosis not present

## 2022-09-18 DIAGNOSIS — I10 Essential (primary) hypertension: Secondary | ICD-10-CM | POA: Diagnosis not present

## 2022-09-18 DIAGNOSIS — R21 Rash and other nonspecific skin eruption: Secondary | ICD-10-CM

## 2022-09-18 DIAGNOSIS — E782 Mixed hyperlipidemia: Secondary | ICD-10-CM | POA: Diagnosis not present

## 2022-09-18 DIAGNOSIS — L299 Pruritus, unspecified: Secondary | ICD-10-CM | POA: Diagnosis not present

## 2022-09-18 LAB — CBC
Hematocrit: 39.7 % (ref 34.0–46.6)
Hemoglobin: 13.6 g/dL (ref 11.1–15.9)
MCH: 31.2 pg (ref 26.6–33.0)
MCHC: 34.3 g/dL (ref 31.5–35.7)
MCV: 91 fL (ref 79–97)
Platelets: 228 10*3/uL (ref 150–450)
RBC: 4.36 x10E6/uL (ref 3.77–5.28)
RDW: 12.3 % (ref 11.7–15.4)
WBC: 5.5 10*3/uL (ref 3.4–10.8)

## 2022-09-18 LAB — CMP14+EGFR
ALT: 18 IU/L (ref 0–32)
AST: 26 IU/L (ref 0–40)
Albumin/Globulin Ratio: 2.1 (ref 1.2–2.2)
Albumin: 4.4 g/dL (ref 3.7–4.7)
Alkaline Phosphatase: 138 IU/L — ABNORMAL HIGH (ref 44–121)
BUN/Creatinine Ratio: 15 (ref 12–28)
BUN: 17 mg/dL (ref 8–27)
Bilirubin Total: 0.4 mg/dL (ref 0.0–1.2)
CO2: 23 mmol/L (ref 20–29)
Calcium: 10.2 mg/dL (ref 8.7–10.3)
Chloride: 99 mmol/L (ref 96–106)
Creatinine, Ser: 1.1 mg/dL — ABNORMAL HIGH (ref 0.57–1.00)
Globulin, Total: 2.1 g/dL (ref 1.5–4.5)
Glucose: 91 mg/dL (ref 70–99)
Potassium: 4.6 mmol/L (ref 3.5–5.2)
Sodium: 138 mmol/L (ref 134–144)
Total Protein: 6.5 g/dL (ref 6.0–8.5)
eGFR: 49 mL/min/{1.73_m2} — ABNORMAL LOW (ref >59)

## 2022-09-18 LAB — LIPID PANEL
Chol/HDL Ratio: 3.3 ratio (ref 0.0–4.4)
Cholesterol, Total: 300 mg/dL — ABNORMAL HIGH (ref 100–199)
HDL: 91 mg/dL (ref >39)
LDL Chol Calc (NIH): 182 mg/dL — ABNORMAL HIGH (ref 0–99)
Triglycerides: 151 mg/dL — ABNORMAL HIGH (ref 0–149)
VLDL Cholesterol Cal: 27 mg/dL (ref 5–40)

## 2022-09-18 LAB — VITAMIN D 25 HYDROXY (VIT D DEFICIENCY, FRACTURES): Vit D, 25-Hydroxy: 88.3 ng/mL (ref 30.0–100.0)

## 2022-09-18 LAB — TSH: TSH: 3.16 u[IU]/mL (ref 0.450–4.500)

## 2022-09-18 MED ORDER — HYDROXYZINE HCL 10 MG PO TABS: ORAL_TABLET | ORAL | 3 refills | Status: DC

## 2022-09-18 NOTE — Patient Instructions (Signed)
Annual exam Jan 23, 2023, call if you need me sooner     Flu vaccine today  You are referred to Dermatologist of your choice  New at bedtime for sleep/ itching is hydroxyzine 10 mg , take 1  or 2 tablets , whichever is effective  Thanks for choosing [facility], we consider it a privelige to serve you.   I will be in touch re daily antibiotic

## 2022-09-19 NOTE — Progress Notes (Signed)
Kelsey Nelson     MRN: [id number]      DOB: 1934/10/16   HPI Kelsey Nelson is here for follow up and re-evaluation of chronic medical conditions, medication management and review of any available recent lab and radiology data.  Preventive health is updated, specifically  Cancer screening and Immunization.  Urology recently started daily septra fo prophylaxis against recurrent uTI which she states ohas been very effective, unfortunately there is a s;light decrease in idney function, will message Urology 5 week h/o red rash which itches on legs and upper back, no new products, allergist eval non revealing, has specific dermatologist she would like to see. Also has concerns about hair loss/ thinning of hair  in crown of her head Chronic insomnia , unchanged, med work for 2 to 3 days , then back to insomnia  Requests not to be shifted around to different Providers in the office when possible   ROS Denies recent fever or chills. Denies sinus pressure, nasal congestion, ear pain or sore throat. Denies chest congestion, productive cough or wheezing. Denies chest pains, palpitations and leg swelling Denies abdominal pain, nausea, vomiting,diarrhea or constipation.   Denies dysuria, frequency, hesitancy or incontinence. Denies joint pain, swelling and limitation in mobility. Denies headaches, seizures, numbness, or tingling. [name]   PE  BP 112/65 (BP Location: Right Arm, Patient Position: Sitting, Cuff Size: Normal)   Pulse 85   Ht '5\' 5"'$  (1.651 m)   Wt 126 lb (57.2 kg)   SpO2 95%   BMI 20.97 kg/m   Patient alert and oriented and in no cardiopulmonary distress.  HEENT: No facial asymmetry, EOMI,     Neck supple .  Chest: Clear to auscultation bilaterally.  CVS: S1, S2 no murmurs, no S3.Regular rate.  ABD: Soft non tender.   Ext: No edema  MS: Adequate ROM spine, shoulders, hips and knees.  Skin: Intact, macular erythematous rash in  on arms and upper back. Also thinning of hairon  crown  Psych: Good eye contact, normal affect. Memory intact not anxious or depressed appearing.  CNS: CN 2-12 intact, power,  normal throughout.no focal deficits noted.   Assessment & Plan. Generalized rash Generalized erythematous rash which is pruritic, bedtime hydroxyzine and refer Dermatology  Insomnia Sleep hygiene reviewed and written information offered also. Prescription sent for  medication needed.   Mixed hyperlipidemia Hyperlipidemia:Low fat diet discussed and encouraged.   Lipid Panel  Lab Results  Component Value Date   CHOL 300 (H) 09/17/2022   HDL 91 09/17/2022   LDLCALC 182 (H) 09/17/2022   TRIG 151 (H) 09/17/2022   CHOLHDL 3.3 09/17/2022     Deteriorated, needs to lower fat intake  HTN (hypertension) Controlled, no change in medication DASH diet and commitment to daily physical activity for a minimum of 30 minutes discussed and encouraged, as a part of hypertension management. The importance of attaining a healthy weight is also discussed.     09/18/2022   10:45 AM 08/29/2022    2:31 PM 08/08/2022    2:16 PM 08/01/2022    8:13 AM 08/01/2022    8:12 AM 07/02/2022    1:41 PM 07/01/2022    2:39 PM  BP/Weight  Systolic BP 211 941 [phone] 314  Diastolic BP 65 78 82 84 87 86 84  Wt. (Lbs) 126 126.8   127.12  128.2  BMI 20.97 kg/m2 20.78 kg/m2   20.83 kg/m2  21.33 kg/m2       CKD (chronic kidney  disease) stage 3, GFR 30-59 ml/min (HCC) deterioration in kidney function noted with introduction of daily septra by urology for management of persistent/ recurrent UTI, will send  message to Urology, may consider changing the antibiotic or reducing dosing/ frequeny

## 2022-09-23 DIAGNOSIS — R21 Rash and other nonspecific skin eruption: Secondary | ICD-10-CM | POA: Insufficient documentation

## 2022-09-23 DIAGNOSIS — N183 Chronic kidney disease, stage 3 unspecified: Secondary | ICD-10-CM | POA: Insufficient documentation

## 2022-09-23 NOTE — Assessment & Plan Note (Signed)
deterioration in kidney function noted with introduction of daily septra by urology for management of persistent/ recurrent UTI, will send  message to Urology, may consider changing the antibiotic or reducing dosing/ frequeny

## 2022-09-23 NOTE — Assessment & Plan Note (Signed)
Hyperlipidemia:Low fat diet discussed and encouraged.   Lipid Panel  Lab Results  Component Value Date   CHOL 300 (H) 09/17/2022   HDL 91 09/17/2022   LDLCALC 182 (H) 09/17/2022   TRIG 151 (H) 09/17/2022   CHOLHDL 3.3 09/17/2022     Deteriorated, needs to lower fat intake

## 2022-09-23 NOTE — Assessment & Plan Note (Signed)
Controlled, no change in medication DASH diet and commitment to daily physical activity for a minimum of 30 minutes discussed and encouraged, as a part of hypertension management. The importance of attaining a healthy weight is also discussed.     09/18/2022   10:45 AM 08/29/2022    2:31 PM 08/08/2022    2:16 PM 08/01/2022    8:13 AM 08/01/2022    8:12 AM 07/02/2022    1:41 PM 07/01/2022    2:39 PM  BP/Weight  Systolic BP 574 935 521 747 159 539 672  Diastolic BP 65 78 82 84 87 86 84  Wt. (Lbs) 126 126.8   127.12  128.2  BMI 20.97 kg/m2 20.78 kg/m2   20.83 kg/m2  21.33 kg/m2

## 2022-09-23 NOTE — Assessment & Plan Note (Signed)
Generalized erythematous rash which is pruritic, bedtime hydroxyzine and refer Dermatology

## 2022-09-23 NOTE — Assessment & Plan Note (Signed)
Sleep hygiene reviewed and written information offered also. Prescription sent for  medication needed.  

## 2022-09-27 DIAGNOSIS — L509 Urticaria, unspecified: Secondary | ICD-10-CM | POA: Diagnosis not present

## 2022-09-27 DIAGNOSIS — L508 Other urticaria: Secondary | ICD-10-CM | POA: Diagnosis not present

## 2022-10-02 ENCOUNTER — Encounter: Payer: Self-pay | Admitting: Urology

## 2022-10-02 ENCOUNTER — Ambulatory Visit (INDEPENDENT_AMBULATORY_CARE_PROVIDER_SITE_OTHER): Payer: Medicare PPO | Admitting: Urology

## 2022-10-02 VITALS — BP 147/72 | HR 73

## 2022-10-02 DIAGNOSIS — N3020 Other chronic cystitis without hematuria: Secondary | ICD-10-CM | POA: Diagnosis not present

## 2022-10-02 DIAGNOSIS — N390 Urinary tract infection, site not specified: Secondary | ICD-10-CM | POA: Diagnosis not present

## 2022-10-02 LAB — BLADDER SCAN AMB NON-IMAGING: Scan Result: 124

## 2022-10-02 MED ORDER — METHENAMINE HIPPURATE 1 G PO TABS: 1.0000 g | ORAL_TABLET | Freq: Every day | ORAL | 11 refills | Status: DC

## 2022-10-02 NOTE — Progress Notes (Signed)
10/02/2022 10:09 AM   Kelsey Nelson 1934-08-14 [id number]  Referring provider: Fayrene Helper, MD [address], [place],  [place] [address]  Followup chronic cystitis   HPI: Kelsey Nelson is an 88yo here for followup for chronic cystitis. No UTIs since last visit. She was started on trimethoprim '100mg'$  QHS 6 months ago. Since she started trimethoprim her GFR has decreased. She denies any LUTS. NO dysuria.    PMH: Past Medical History:  Diagnosis Date   Acute cystitis with hematuria 10/26/2013   Allergic rhinitis 11/08/2017   Blindness of right eye    BMI between 19-24,adult 2010 145 LBS   Diverticula of small intestine Dec 2010   Diverticulosis    Man   DIVERTICULOSIS, SIGMOID COLON 03/31/2008   Qualifier: Diagnosis of  By: Dierdre Harness  Noted on abdominal and pelvic scan in 04/2011.  Extensive colonic diverticulosis , and possible muscular hypertrophy at rectosigmoid junction    Family hx of colon cancer 11/09/2013   Functional constipation 2010   GERD (gastroesophageal reflux disease)    HTN (hypertension)    Hyperlipemia    Pancreatic cyst 02/05/2013   Renal cyst 05/27/2011   Right eye trauma 1949   Loss of vision resulted at age 74   Schatzki's ring Dec 2010   Skin lesions, generalized 06/19/2015   Tubular adenoma 11/09/2013    Surgical History: Past Surgical History:  Procedure Laterality Date   ABDOMINAL HYSTERECTOMY     BREAST REDUCTION SURGERY  1992 BIL   COLONOSCOPY  DEC 2010   SIMPLE ADENOMAS (6-8), [place] TICS, SML IH   COLONOSCOPY N/A 12/08/2013   Procedure: COLONOSCOPY;  Surgeon: Rogene Houston, MD;  Location: [facility];  Service: Endoscopy;  Laterality: N/A;  225   EVISCERATION Right 05/08/2015   Procedure: EVISCERATION WITH IMPLANT RIGHT EYE;  Surgeon: Williams Che, MD;  Location: [facility];  Service: Ophthalmology;  Laterality: Right;   EYE SURGERY  RIGHT 2005   EYE SURGERY  right 2016   removed and false eye placed in 08/2015   TOTAL  ABDOMINAL HYSTERECTOMY W/ BILATERAL SALPINGOOPHORECTOMY  1985 FIBROIDS   UPPER GASTROINTESTINAL ENDOSCOPY  DEC 2010   Bx-MILD GASTRITIS/DUODENITIS, DUODENAL DIVERTICULA, SCHATZKI'S RING    Home Medications:  Allergies as of 10/02/2022       Reactions   Codeine    Crestor [rosuvastatin] Other (See Comments)   Can't recall   Fosamax [alendronate Sodium]    heartburn        Medication List        Accurate as of October 02, 2022 10:09 AM. If you have any questions, ask your nurse or doctor.          ascorbic acid 500 MG tablet Commonly known as: VITAMIN C Take 500 mg by mouth daily.   cetirizine 10 MG tablet Commonly known as: ZYRTEC Take 1 tablet (10 mg total) by mouth at bedtime.   conjugated estrogens 0.625 MG/GM vaginal cream Commonly known as: PREMARIN Apply one fingertip to introitus every 2 days.   diphenhydrAMINE 25 mg capsule Commonly known as: Benadryl Allergy Take 1 capsule (25 mg total) by mouth every 6 (six) hours as needed.   famotidine 20 MG tablet Commonly known as: PEPCID Take 1 tablet (20 mg total) by mouth 2 (two) times daily.   fexofenadine 180 MG tablet Commonly known as: Allegra Allergy Take 1 tablet (180 mg total) by mouth every morning.   hydrOXYzine 10 MG tablet Commonly known  as: ATARAX Take one to two tablets at bedtime for sleep and for itching   ondansetron 4 MG disintegrating tablet Commonly known as: ZOFRAN-ODT Take 1 tablet (4 mg total) by mouth every 8 (eight) hours as needed for nausea or vomiting.   OVER THE COUNTER MEDICATION Multi Collagen Protein - Patient states that she takes 1 scoop a day.   polyethylene glycol powder 17 GM/SCOOP powder Commonly known as: GLYCOLAX/MIRALAX Take 17 g by mouth daily as needed.   pravastatin 10 MG tablet Commonly known as: PRAVACHOL Take 1 tablet by mouth daily at 12 noon.   PROBIOTIC DAILY PO Take 1 tablet by mouth daily.   ramipril 10 MG capsule Commonly known as:  ALTACE TAKE (1) CAPSULE BY MOUTH ONCE DAILY.   triamcinolone cream 0.1 % Commonly known as: KENALOG Apply 1 Application topically as directed.   trimethoprim 100 MG tablet Commonly known as: TRIMPEX Take 1 tablet (100 mg total) by mouth at bedtime. For prevention of UTIs   TUMS PO Take 1 tablet by mouth 3 (three) times daily as needed.   VITAMIN D3 PO Take 25 mcg by mouth daily.   VITAMIN-B COMPLEX PO Take 1 tablet by mouth daily.   Zinc 50 MG Tabs Take 1 tablet by mouth daily.        Allergies:  Allergies  Allergen Reactions   Codeine    Crestor [Rosuvastatin] Other (See Comments)    Can't recall   Fosamax [Alendronate Sodium]     heartburn    Family History: Family History  Problem Relation Age of Onset   Pancreatic cancer Sister    Colon cancer Paternal Aunt    Colon polyps Neg Hx     Social History:  reports that she has never smoked. She has never used smokeless tobacco. She reports that she does not drink alcohol and does not use drugs.  ROS: All other review of systems were reviewed and are negative except what is noted above in HPI  Physical Exam: BP (!) 147/72   Pulse 73   Constitutional:  Alert and oriented, No acute distress. HEENT: [place] AT, moist mucus membranes.  Trachea midline, no masses. Cardiovascular: No clubbing, cyanosis, or edema. Respiratory: Normal respiratory effort, no increased work of breathing. GI: Abdomen is soft, nontender, nondistended, no abdominal masses GU: No CVA tenderness.  Lymph: No cervical or inguinal lymphadenopathy. Skin: No rashes, bruises or suspicious lesions. Neurologic: Grossly intact, no focal deficits, moving all 4 extremities. Psychiatric: Normal mood and affect.  Laboratory Data: Lab Results  Component Value Date   WBC 5.5 09/17/2022   HGB 13.6 09/17/2022   HCT 39.7 09/17/2022   MCV 91 09/17/2022   PLT 228 09/17/2022    Lab Results  Component Value Date   CREATININE 1.10 (H) 09/17/2022     No results found for: "PSA"  No results found for: "TESTOSTERONE"  Lab Results  Component Value Date   HGBA1C 5.6 09/03/2016    Urinalysis    Component Value Date/Time   COLORURINE STRAW (A) 12/04/2018 1058   APPEARANCEUR Cloudy (A) 08/08/2022 1419   LABSPEC 1.003 (L) 12/04/2018 1058   PHURINE 7.0 12/04/2018 1058   GLUCOSEU Negative 08/08/2022 1419   HGBUR SMALL (A) 12/04/2018 1058   BILIRUBINUR Negative 08/08/2022 1419   KETONESUR negative 01/22/2022 1316   KETONESUR NEGATIVE 12/04/2018 1058   PROTEINUR Negative 08/08/2022 1419   PROTEINUR NEGATIVE 12/04/2018 1058   UROBILINOGEN 0.2 01/22/2022 1316   UROBILINOGEN 0.2 07/14/2009 2018   NITRITE  Negative 08/08/2022 1419   NITRITE NEGATIVE 12/04/2018 1058   LEUKOCYTESUR 1+ (A) 08/08/2022 1419    Lab Results  Component Value Date   LABMICR See below: 08/08/2022   WBCUA 11-30 (A) 08/08/2022   LABEPIT 0-10 08/08/2022   BACTERIA Many (A) 08/08/2022    Pertinent Imaging:  Results for orders placed during the hospital encounter of 07/31/12  DG Abd 1 View  Narrative *RADIOLOGY REPORT*  Clinical Data: History of constipation.  Abdominal pain.  ABDOMEN - 1 VIEW  Comparison: 06/21/2011  Findings: Moderate stool burden within the colon, slightly decreased in the left colon since prior study.  No evidence of bowel obstruction.  No free air.  No organomegaly or acute bony abnormality.  Calcified phleboliths in the anatomic pelvis.  IMPRESSION: Moderate stool burden, decreased from prior study.  No acute findings.  Original Report Authenticated By: Raelyn Number, M.D.  No results found for this or any previous visit.  No results found for this or any previous visit.  No results found for this or any previous visit.  No results found for this or any previous visit.  No valid procedures specified. No results found for this or any previous visit.  No results found for this or any previous  visit.   Assessment & Plan:    1. Chronic cystitis -we will stop trimethoprim and start hiprex 1g daily - Urinalysis, Routine w reflex microscopic - BLADDER SCAN AMB NON-IMAGING   No follow-ups on file.  Nicolette Bang, MD  [facility] [place]

## 2022-10-02 NOTE — Progress Notes (Signed)
post void residual=124 

## 2022-10-02 NOTE — Patient Instructions (Signed)
Urinary Tract Infection, Adult  A urinary tract infection (UTI) is an infection of any part of the urinary tract. The urinary tract includes the kidneys, ureters, bladder, and urethra. These organs make, store, and get rid of urine in the body. An upper UTI affects the ureters and kidneys. A lower UTI affects the bladder and urethra. What are the causes? Most urinary tract infections are caused by bacteria in your genital area around your urethra, where urine leaves your body. These bacteria grow and cause inflammation of your urinary tract. What increases the risk? You are more likely to develop this condition if: You have a urinary catheter that stays in place. You are not able to control when you urinate or have a bowel movement (incontinence). You are female and you: Use a spermicide or diaphragm for birth control. Have low estrogen levels. Are pregnant. You have certain genes that increase your risk. You are sexually active. You take antibiotic medicines. You have a condition that causes your flow of urine to slow down, such as: An enlarged prostate, if you are female. Blockage in your urethra. A kidney stone. A nerve condition that affects your bladder control (neurogenic bladder). Not getting enough to drink, or not urinating often. You have certain medical conditions, such as: Diabetes. A weak disease-fighting system (immunesystem). Sickle cell disease. Gout. Spinal cord injury. What are the signs or symptoms? Symptoms of this condition include: Needing to urinate right away (urgency). Frequent urination. This may include small amounts of urine each time you urinate. Pain or burning with urination. Blood in the urine. Urine that smells bad or unusual. Trouble urinating. Cloudy urine. Vaginal discharge, if you are female. Pain in the abdomen or the lower back. You may also have: Vomiting or a decreased appetite. Confusion. Irritability or tiredness. A fever or  chills. Diarrhea. The first symptom in older adults may be confusion. In some cases, they may not have any symptoms until the infection has worsened. How is this diagnosed? This condition is diagnosed based on your medical history and a physical exam. You may also have other tests, including: Urine tests. Blood tests. Tests for STIs (sexually transmitted infections). If you have had more than one UTI, a cystoscopy or imaging studies may be done to determine the cause of the infections. How is this treated? Treatment for this condition includes: Antibiotic medicine. Over-the-counter medicines to treat discomfort. Drinking enough water to stay hydrated. If you have frequent infections or have other conditions such as a kidney stone, you may need to see a health care provider who specializes in the urinary tract (urologist). In rare cases, urinary tract infections can cause sepsis. Sepsis is a life-threatening condition that occurs when the body responds to an infection. Sepsis is treated in the hospital with IV antibiotics, fluids, and other medicines. Follow these instructions at home:  Medicines Take over-the-counter and prescription medicines only as told by your health care provider. If you were prescribed an antibiotic medicine, take it as told by your health care provider. Do not stop using the antibiotic even if you start to feel better. General instructions Make sure you: Empty your bladder often and completely. Do not hold urine for long periods of time. Empty your bladder after sex. Wipe from front to back after urinating or having a bowel movement if you are female. Use each tissue only one time when you wipe. Drink enough fluid to keep your urine pale yellow. Keep all follow-up visits. This is important. Contact a health   care provider if: Your symptoms do not get better after 1-2 days. Your symptoms go away and then return. Get help right away if: You have severe pain in  your back or your lower abdomen. You have a fever or chills. You have nausea or vomiting. Summary A urinary tract infection (UTI) is an infection of any part of the urinary tract, which includes the kidneys, ureters, bladder, and urethra. Most urinary tract infections are caused by bacteria in your genital area. Treatment for this condition often includes antibiotic medicines. If you were prescribed an antibiotic medicine, take it as told by your health care provider. Do not stop using the antibiotic even if you start to feel better. Keep all follow-up visits. This is important. This information is not intended to replace advice given to you by your health care provider. Make sure you discuss any questions you have with your health care provider. Document Revised: 07/21/2020 Document Reviewed: 07/21/2020 Elsevier Patient Education  2023 Elsevier Inc.  

## 2022-10-03 LAB — URINALYSIS, ROUTINE W REFLEX MICROSCOPIC
Bilirubin, UA: NEGATIVE
Glucose, UA: NEGATIVE
Ketones, UA: NEGATIVE
Nitrite, UA: POSITIVE — AB
Protein,UA: NEGATIVE
RBC, UA: NEGATIVE
Specific Gravity, UA: 1.01 (ref 1.005–1.030)
Urobilinogen, Ur: 0.2 mg/dL (ref 0.2–1.0)
pH, UA: 5.5 (ref 5.0–7.5)

## 2022-10-03 LAB — MICROSCOPIC EXAMINATION

## 2022-10-10 ENCOUNTER — Encounter (INDEPENDENT_AMBULATORY_CARE_PROVIDER_SITE_OTHER): Payer: Self-pay | Admitting: Gastroenterology

## 2022-10-10 ENCOUNTER — Other Ambulatory Visit: Payer: Self-pay | Admitting: Family Medicine

## 2022-10-28 ENCOUNTER — Ambulatory Visit (INDEPENDENT_AMBULATORY_CARE_PROVIDER_SITE_OTHER): Payer: Medicare PPO | Admitting: Family Medicine

## 2022-10-28 ENCOUNTER — Telehealth: Payer: Self-pay

## 2022-10-28 ENCOUNTER — Encounter: Payer: Self-pay | Admitting: Family Medicine

## 2022-10-28 VITALS — BP 116/70 | HR 70 | Ht 65.0 in | Wt 131.1 lb

## 2022-10-28 DIAGNOSIS — R21 Rash and other nonspecific skin eruption: Secondary | ICD-10-CM

## 2022-10-28 DIAGNOSIS — L299 Pruritus, unspecified: Secondary | ICD-10-CM

## 2022-10-28 MED ORDER — CAPSAICIN 0.025 % EX CREA: TOPICAL_CREAM | Freq: Two times a day (BID) | CUTANEOUS | 0 refills | Status: DC

## 2022-10-28 NOTE — Assessment & Plan Note (Signed)
History of chronic urticaria Encouraged to continue taking Xyzal 5 mg daily A prescription for topical capsicin cream is order to relieve symptoms of pruritus Encouraged to continue following up with her dermatologist and allergist She reports that her next appointment with the allergist is in December 2023 Encouraged to use personal care product regimen free of fragrances, dyes & essential oils Encouraged to moisturize daily from head to toe with a ceramide-containing cream or lotion, with the best time to do this being after they get out of the shower when their skin is still slightly damp Encouraged to avoid bar soap. Use a ceramide-containing face cleanser and body wash instead  Encouraged to keep bathing time short (<10 minutes) with warm (not hot), water Encouraged to use cool-mist humidifier running in the bedroom at night, making sure to clean out the unit once weekly with diluted bleach solution

## 2022-10-28 NOTE — Patient Instructions (Addendum)
I appreciate the opportunity to provide care to you today!    Follow up:  Dr. Moshe Cipro   Please pick up your medication at the pharmacy and follow up with your dermatologist and allergist as scheduled  Skin care   Do not use scented soaps, detergents, perfumes, and cosmetic products. Instead, use gentle, unscented versions of these items. Apply moisturizing creams to your skin frequently, at least twice daily. Apply immediately after bathing while skin is still wet. Take medicines or apply medicated creams only as told by your health care provider. This may include: Corticosteroid cream or topical calcineurin inhibitor. Anti-itch lotions containing urea, camphor, or menthol. Oral antihistamines. Do not take hot showers or baths, which can make itching worse. A short, cool shower may help with itching as long as you apply moisturizing lotion after the shower. Apply a cool, wet cloth (cool compress) to the affected areas. You may take lukewarm baths with one of the following: Epsom salts. You can get these at your local pharmacy or grocery store. Follow the instructions on the packaging. Baking soda. Pour a small amount into the bath as told by your health care provider. Colloidal oatmeal. You can get this at your local pharmacy or grocery store. Follow the instructions on the packaging. Do not scratch your skin.  Please continue to a heart-healthy diet and increase your physical activities. Try to exercise for 78mns at least three times a week.      It was a pleasure to see you and I look forward to continuing to work together on your health and well-being. Please do not hesitate to call the office if you need care or have questions about your care.   Have a wonderful day and week. With Gratitude, GAlvira MondayMSN, FNP-BC

## 2022-10-28 NOTE — Telephone Encounter (Signed)
Patient as not slept , itching all over took to Dr Randye Lobo allergist did a biopsy, scheduled appointment with Alvira Monday, NP 10/29/2022 to come into office.

## 2022-10-28 NOTE — Progress Notes (Signed)
Acute Office Visit  Subjective:     Patient ID: Kelsey Nelson, female    DOB: 09/04/1934, 88 y.o.   MRN: [id number]  Chief Complaint  Patient presents with   Urticaria    Pt reports body itching with bumps all over, onset of this began 07/29/2023, unable to figure out what she is allergic to.    HPI Patient is in today with complaints of generalized pruritus.  She has chronic urticaria and follows up with the allergist and dermatologist.  She takes Xyzal 5 mg daily and reports  taking diphenhydramine  25 mg  for allergy symptoms.  She reports minimal relief of her symptoms with Kenalog 0.1% cream.  She does not have any red flags, such as fever, joint pains, or prominent skin changes.  No symptoms of angioedema were noted.  She reports that her allergy testing showed an allergy to red meat only, and she voices that she does not eat red meat.  She reports that she does not use bar soaps, but she does use scented bubble baths.  She reports that she keeps her skin moisturized daily.  She denies changes in her daily routine.  Review of Systems  Constitutional:  Negative for chills and fever.  Cardiovascular:  Negative for claudication and leg swelling.  Gastrointestinal:  Negative for constipation and nausea.  Genitourinary:  Negative for frequency and urgency.  Skin:  Positive for itching.  Neurological:  Negative for dizziness and headaches.        Objective:    BP 116/70   Pulse 70   Ht '5\' 5"'$  (1.651 m)   Wt 131 lb 1.9 oz (59.5 kg)   SpO2 94%   BMI 21.82 kg/m    Physical Exam HENT:     Head: Normocephalic.  Cardiovascular:     Rate and Rhythm: Normal rate and regular rhythm.     Pulses: Normal pulses.     Heart sounds: Normal heart sounds.  Pulmonary:     Effort: Pulmonary effort is normal.     Breath sounds: Normal breath sounds.  Skin:    Findings: Rash (Urticarial rash noted on her arms and back) present.  Neurological:     Mental Status: She is alert.     No  results found for any visits on 10/28/22.      Assessment & Plan:   Problem List Items Addressed This Visit       Musculoskeletal and Integument   Generalized rash    History of chronic urticaria Encouraged to continue taking Xyzal 5 mg daily A prescription for topical capsicin cream is order to relieve symptoms of pruritus Encouraged to continue following up with her dermatologist and allergist She reports that her next appointment with the allergist is in December 2023 Encouraged to use personal care product regimen free of fragrances, dyes & essential oils Encouraged to moisturize daily from head to toe with a ceramide-containing cream or lotion, with the best time to do this being after they get out of the shower when their skin is still slightly damp Encouraged to avoid bar soap. Use a ceramide-containing face cleanser and body wash instead  Encouraged to keep bathing time short (<10 minutes) with warm (not hot), water Encouraged to use cool-mist humidifier running in the bedroom at night, making sure to clean out the unit once weekly with diluted bleach solution       Other Visit Diagnoses     Pruritus    -  Primary  Relevant Medications   capsaicin (ZOSTRIX) 0.025 % cream       Meds ordered this encounter  Medications   capsaicin (ZOSTRIX) 0.025 % cream    Sig: Apply topically 2 (two) times daily.    Dispense:  60 g    Refill:  0    Return if symptoms worsen or fail to improve.  Alvira Monday, FNP

## 2022-10-31 ENCOUNTER — Ambulatory Visit (INDEPENDENT_AMBULATORY_CARE_PROVIDER_SITE_OTHER): Payer: Medicare PPO | Admitting: Gastroenterology

## 2022-11-13 ENCOUNTER — Telehealth: Payer: Self-pay

## 2022-11-13 NOTE — Telephone Encounter (Signed)
Pharmacist requested refilled rx, Trimethoprim. Informed pharmacist we are not the prescribing provider and pharmacist will have to contact  patient's PCP for refill. Pharmacist voiced understanding

## 2022-11-21 ENCOUNTER — Ambulatory Visit (INDEPENDENT_AMBULATORY_CARE_PROVIDER_SITE_OTHER): Payer: Medicare PPO | Admitting: Gastroenterology

## 2022-11-21 ENCOUNTER — Encounter (INDEPENDENT_AMBULATORY_CARE_PROVIDER_SITE_OTHER): Payer: Self-pay | Admitting: Gastroenterology

## 2022-11-21 VITALS — BP 125/75 | HR 84 | Temp 97.1°F | Ht 65.0 in | Wt 129.2 lb

## 2022-11-21 DIAGNOSIS — K219 Gastro-esophageal reflux disease without esophagitis: Secondary | ICD-10-CM

## 2022-11-21 NOTE — Patient Instructions (Signed)
Continue with good water intake and prunes for constipation Please increase pepcid to '40mg'$  in the evenings, as discussed I prefer you not to use tums more than just on occasion as these can cause constipation and elevation of your calcium levels. Please let me know if heartburn symptoms do not improve with increase in dosage of pepcid  Follow up 1 year

## 2022-11-21 NOTE — Progress Notes (Signed)
Referring Provider: Fayrene Helper, MD Primary Care Physician:  Fayrene Helper, MD Primary GI Physician:   Chief Complaint  Patient presents with   Follow-up    Patient here today for a follow up appointment on Constipation. Patient states she is no longer having issues with the constipation. She says she eats five prunes per day and has no issue with it any longer. She says she is still having issues with itching on her hands.Has occasional heartburn and takes tums for this, also uses famotidine prn.   HPI:   Kelsey Nelson is a 88 y.o. female with past medical history of  GERD, hypertension, hyperlipidemia, who presents for follow up of constipation and nausea.   Patient presenting today for follow up of constipation.  Last seen July 2023, at that time  more constipation and nausea. She has been moving her bowels every 3 days usually. She reports stools are a [place] 2 in consistency, they are dark but no melena. She has to strain to move her bowels. She takes Miralax one capful as needed and Pepcid as needed. She feels frequently nauseated, which sometimes improves after she moves her bowels.   Most recent TSH 3.1 on 07/27/21. CMP on 01/15/2022 had normal electrolytes.   She also noticed some bumps in her skin a couple of weeks ago, which has caused significant itching . She was prescribed triamcinolone ointment without improvement. She will like to talk to another provider first before considering a dermatology evaluation.  Recommended to start miralax, eat prune/kiwi daily and repeat CMP TSH If constipation persisted.  Present: Patient states that constipation is better, she Is having a BM everyday as long a she eats prunes daily. No abdominal pain, rectal bleeding or melena.   She is taking pepcid '20mg'$  for her GERD with some breakthrough heartburn requiring her to use tums 2-3x/week.  Denies dysphagia, early satiety or odynophagia. No changes in appetite or weight loss.  Symptoms typically occur more in the evenings but are not worse when she is lying down. Denies sore throat, hoarseness or cough.   Patient states that she has continued itching/rash to her skin, she has seen an allergist and a dermatologist, states she did not get many answers from them. She is using a cream she has an Human resources officer but still noting the rash/itching frequently   Last Colonoscopy:2014 Examination performed to cecum. Small polyps ablated via cold biopsy from proximal transverse colon. Multiple diverticula at sigmoid colon. Small external hemorrhoids. Last Endoscopy: never  Recommendations:    Past Medical History:  Diagnosis Date   Acute cystitis with hematuria 10/26/2013   Allergic rhinitis 11/08/2017   Blindness of right eye    BMI between 19-24,adult 2010 145 LBS   Diverticula of small intestine Dec 2010   Diverticulosis    [place]   DIVERTICULOSIS, SIGMOID COLON 03/31/2008   Qualifier: Diagnosis of  By: Dierdre Harness  Noted on abdominal and pelvic scan in 04/2011.  Extensive colonic diverticulosis , and possible muscular hypertrophy at rectosigmoid junction    Family hx of colon cancer 11/09/2013   Functional constipation 2010   GERD (gastroesophageal reflux disease)    HTN (hypertension)    Hyperlipemia    Pancreatic cyst 02/05/2013   Renal cyst 05/27/2011   Right eye trauma 1949   Loss of vision resulted at age 9   Schatzki's ring Dec 2010   Skin lesions, generalized 06/19/2015   Tubular adenoma 11/09/2013    Past Surgical History:  Procedure  Laterality Date   ABDOMINAL HYSTERECTOMY     BREAST REDUCTION SURGERY  1992 BIL   COLONOSCOPY  DEC 2010   SIMPLE ADENOMAS (6-8), [place] TICS, [facility] IH   COLONOSCOPY N/A 12/08/2013   Procedure: COLONOSCOPY;  Surgeon: Rogene Houston, MD;  Location: [facility];  Service: Endoscopy;  Laterality: N/A;  225   EVISCERATION Right 05/08/2015   Procedure: EVISCERATION WITH IMPLANT RIGHT EYE;  Surgeon: Williams Che, MD;  Location: [facility]  ORS;  Service: Ophthalmology;  Laterality: Right;   EYE SURGERY  RIGHT 2005   EYE SURGERY  right 2016   removed and false eye placed in 08/2015   TOTAL ABDOMINAL HYSTERECTOMY W/ BILATERAL SALPINGOOPHORECTOMY  1985 FIBROIDS   UPPER GASTROINTESTINAL ENDOSCOPY  DEC 2010   Bx-MILD GASTRITIS/DUODENITIS, DUODENAL DIVERTICULA, SCHATZKI'S RING    Current Outpatient Medications  Medication Sig Dispense Refill   B Complex Vitamins (VITAMIN-B COMPLEX PO) Take 1 tablet by mouth daily.     Calcium Carbonate Antacid (TUMS PO) Take 1 tablet by mouth 3 (three) times daily as needed.     capsaicin (ZOSTRIX) 0.025 % cream Apply topically 2 (two) times daily. 60 g 0   Cholecalciferol (VITAMIN D3 PO) Take 25 mcg by mouth daily.     conjugated estrogens (PREMARIN) vaginal cream Apply one fingertip to introitus every 2 days. 42.5 g 5   diphenhydrAMINE (BENADRYL ALLERGY) 25 mg capsule Take 1 capsule (25 mg total) by mouth every 6 (six) hours as needed. 30 capsule 0   famotidine (PEPCID) 20 MG tablet Take 1 tablet (20 mg total) by mouth 2 (two) times daily. 60 tablet 3   fexofenadine (ALLEGRA ALLERGY) 180 MG tablet Take 1 tablet (180 mg total) by mouth every morning. 30 tablet 3   hydrOXYzine (ATARAX) 10 MG tablet Take one to two tablets at bedtime for sleep and for itching 60 tablet 3   OVER THE COUNTER MEDICATION Multi Collagen Protein - Patient states that she takes 1 scoop a day.     polyethylene glycol powder (GLYCOLAX/MIRALAX) 17 GM/SCOOP powder Take 17 g by mouth daily as needed. 3350 g 1   pravastatin (PRAVACHOL) 10 MG tablet Take 1 tablet by mouth daily at 12 noon.     Probiotic Product (PROBIOTIC DAILY PO) Take 1 tablet by mouth daily.     ramipril (ALTACE) 10 MG capsule TAKE (1) CAPSULE BY MOUTH ONCE DAILY. 90 capsule 0   triamcinolone cream (KENALOG) 0.1 % Apply 1 Application topically as directed.     vitamin C (ASCORBIC ACID) 500 MG tablet Take 500 mg by mouth daily.     Zinc 50 MG TABS Take 1  tablet by mouth daily.     methenamine (HIPREX) 1 g tablet Take 1 tablet (1 g total) by mouth at bedtime. 30 tablet 11   No current facility-administered medications for this visit.    Allergies as of 11/21/2022 - Review Complete 11/21/2022  Allergen Reaction Noted   Codeine  07/22/2015   Crestor [rosuvastatin] Other (See Comments) 10/26/2021   Fosamax [alendronate sodium]  12/31/2013    Family History  Problem Relation Age of Onset   Pancreatic cancer Sister    Colon cancer Paternal Aunt    Colon polyps Neg Hx     Social History   Socioeconomic History   Marital status: Married    Spouse name: [name]    Number of children: 3   Years of education: 12   Highest education level: 12th grade  Occupational History  Occupation: retired   Tobacco Use   Smoking status: Never   Smokeless tobacco: Never  Vaping Use   Vaping Use: Never used  Substance and Sexual Activity   Alcohol use: No    Alcohol/week: 0.0 standard drinks of alcohol   Drug use: No   Sexual activity: Not Currently  Other Topics Concern   Not on file  Social History Narrative   MARRIED TO DAVID Alberson. 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/   Social Determinants of Health   Financial Resource Strain: Low Risk  (01/22/2022)   Overall Financial Resource Strain (CARDIA)    Difficulty of Paying Living Expenses: Not hard at all  Food Insecurity: No Food Insecurity (01/22/2022)   Hunger Vital Sign    Worried About Running Out of Food in the Last Year: Never true    Ran Out of Food in the Last Year: Never true  Transportation Needs: No Transportation Needs (01/22/2022)   PRAPARE - Hydrologist (Medical): No    Lack of Transportation (Non-Medical): No  Physical Activity: Sufficiently Active (01/22/2022)   Exercise Vital Sign    Days of Exercise per Week: 5 days    Minutes of Exercise per Session: 60 min  Stress: No Stress Concern Present (01/22/2022)   [place]    Feeling of Stress : Not at all  Social Connections: [place] (01/22/2022)   Social Connection and Isolation Panel [NHANES]    Frequency of Communication with Friends and Family: Never    Frequency of Social Gatherings with Friends and Family: More than three times a week    Attends Religious Services: More than 4 times per year    Active Member of [employer] or Organizations: Yes    Attends Music therapist: More than 4 times per year    Marital Status: Married   Review of systems General: negative for malaise, night sweats, fever, chills, weight loss Neck: Negative for lumps, goiter, pain and significant neck swelling Resp: Negative for cough, wheezing, dyspnea at rest CV: Negative for chest pain, leg swelling, palpitations, orthopnea GI: denies melena, hematochezia, nausea, vomiting, diarrhea, constipation, dysphagia, odyonophagia, early satiety or unintentional weight loss. +heartburn MSK: Negative for joint pain or swelling, back pain, and muscle pain. Derm: +itching +rash Psych: Denies depression, anxiety, memory loss, confusion. No homicidal or suicidal ideation.  Heme: Negative for prolonged bleeding, bruising easily, and swollen nodes. Endocrine: Negative for cold or heat intolerance, polyuria, polydipsia and goiter. Neuro: negative for tremor, gait imbalance, syncope and seizures. The remainder of the review of systems is noncontributory.  Physical Exam: BP 125/75 (BP Location: Left Arm, Patient Position: Sitting, Cuff Size: Small)   Pulse 84   Temp (!) 97.1 F (36.2 C) (Temporal)   Ht '5\' 5"'$  (1.651 m)   Wt 129 lb 3.2 oz (58.6 kg)   BMI 21.50 kg/m  General:   Alert and oriented. No distress noted. Pleasant and cooperative.  Head:  Normocephalic and atraumatic. Eyes:  Conjuctiva clear without scleral icterus. Mouth:  Oral mucosa pink and moist. Good dentition. No lesions. Heart: Normal rate and rhythm, s1 and s2 heart  sounds present.  Lungs: Clear lung sounds in all lobes. Respirations equal and unlabored. Abdomen:  +BS, soft, non-tender and non-distended. No rebound or guarding. No HSM or masses noted. Derm: No palmar erythema or jaundice. Diffuse, erythematous uritcaric appearing rash Msk:  Symmetrical without gross deformities. Normal posture. Extremities:  Without edema. Neurologic:  Alert and  oriented x4 Psych:  Alert and cooperative. Normal mood and affect.  Invalid input(s): "6 MONTHS"   ASSESSMENT: DANESSA MENSCH is a 87 y.o. female presenting today for follow up of constipation and GERD.  Constipation resolved with use of daily prunes, having a BM once daily without rectal bleeding, melena, abdominal pain. Encouraged to continue eating prunes daily, ensure good water intake as well.  Currently on pepcid '20mg'$  daily for GERD, with breakthrough heartburn 2-3x/week she is having to use tums for. I discussed trial of low dose PPI as her symptoms are not well controlled on H2B, patient was not interested in starting PPI therapy. Discussed importance of reflux precautions to include avoiding greasy, spicy, fried, citrus foods, and be mindful that caffeine, carbonated drinks, chocolate and alcohol can increase reflux symptoms. Stay upright 2-3 hours after eating, prior to lying down and avoid eating late in the evenings. We will try increasing pepcid to '40mg'$  daily and she will make me aware if her GERD is not better controlled on this dosing. Patient counseled on adverse effects of frequent tums use to include hypercalcemia and constipation.  PLAN:  Increase pepcid to '40mg'$  QHS  2. Reflux precautions  3. Ample water intake 4. Avoid frequent use of tums  All questions were answered, patient verbalized understanding and is in agreement with plan as outlined above.    Follow Up: 1 year  Melvinia Ashby L. Alver Sorrow, MSN, APRN, AGNP-C Adult-Gerontology Nurse Practitioner [facility]  I  have reviewed the note and agree with the APP's assessment as described in this progress note  Maylon Peppers, MD Gastroenterology and Hepatology [facility]

## 2022-11-22 ENCOUNTER — Other Ambulatory Visit: Payer: Self-pay

## 2022-11-22 DIAGNOSIS — N390 Urinary tract infection, site not specified: Secondary | ICD-10-CM

## 2022-11-22 MED ORDER — METHENAMINE HIPPURATE 1 G PO TABS: 1.0000 g | ORAL_TABLET | Freq: Every day | ORAL | 3 refills | Status: DC

## 2022-11-29 ENCOUNTER — Ambulatory Visit: Payer: Medicare PPO | Admitting: Allergy & Immunology

## 2022-11-29 ENCOUNTER — Other Ambulatory Visit: Payer: Self-pay

## 2022-11-29 ENCOUNTER — Telehealth: Payer: Self-pay | Admitting: *Deleted

## 2022-11-29 ENCOUNTER — Encounter: Payer: Self-pay | Admitting: Allergy & Immunology

## 2022-11-29 VITALS — BP 136/84 | HR 82 | Temp 97.3°F | Resp 17

## 2022-11-29 DIAGNOSIS — L508 Other urticaria: Secondary | ICD-10-CM | POA: Diagnosis not present

## 2022-11-29 DIAGNOSIS — J3089 Other allergic rhinitis: Secondary | ICD-10-CM

## 2022-11-29 DIAGNOSIS — K219 Gastro-esophageal reflux disease without esophagitis: Secondary | ICD-10-CM

## 2022-11-29 NOTE — Progress Notes (Signed)
FOLLOW UP  Date of Service/Encounter:  11/29/22   Assessment:   Chronic urticaria - starting her on Xolair    Perennial allergic rhinitis (cockroach)    GERD    Preference to avoid daily medications  Bed bugs in her home (has exterminator scheduled)    Plan/Recommendations:   1. Chronic urticaria - We will give you the Xolair here. - Call us when you get it in and we can make an appointment for your injection. - You do not need to be driving to [facility] or [facility] for these injections. - We have hundreds of patients on Xolair.  - Continue with your antihistamines for now.  - I am glad that the exterminator is already coming out on Monday. - I am sure that the bedbugs are not helping with the itching!   2. Return in about 3 months (around 02/28/2023).    Subjective:   Kelsey Nelson is a 88 y.o. female presenting today for follow up of  Chief Complaint  Patient presents with   Follow-up   Pruritus    Kelsey Nelson has a history of the following: Patient Active Problem List   Diagnosis Date Noted   Generalized rash 09/23/2022   CKD (chronic kidney disease) stage 3, GFR 30-59 ml/min ([place]) 09/23/2022   Acute allergic reaction 08/01/2022   Allergic dermatitis 07/19/2022   Food allergy 07/19/2022   Rash and other nonspecific skin eruption 06/21/2022   Sinusitis 06/03/2022   Skin lesions 01/22/2022   Abdominal spasms 01/22/2022   Blepharitis of left lower eyelid 10/28/2021   Recurrent UTI 05/10/2021   Skipped heart beats 10/19/2020   Dermatitis 07/31/2020   COVID-19 virus infection 02/25/2020   Anterior knee pain, right 10/05/2019   Belching symptom 10/05/2019   Injury of left knee 11/28/2017   Annual physical exam 03/23/2016   Ectropion of right eye 11/22/2015   Acquired superior sulcus deformity of orbit 07/04/2015   History of evisceration of eye 07/04/2015   Nuclear sclerosis of left eye 07/04/2015   Urinary urgency 10/31/2013   Loss of taste  07/31/2012   Pancreatic cyst 04/21/2012   H/O gastroesophageal reflux (GERD) 04/21/2012   Nocturia 12/02/2011   Insomnia 09/20/2011   Nausea 06/22/2011   Constipation 06/21/2011   GERD 11/20/2009   Mixed hyperlipidemia 03/31/2008   BLINDNESS, RIGHT EYE 03/31/2008   HTN (hypertension) 03/31/2008   Osteoporosis 03/31/2008    History obtained from: chart review and patient.  Kelsey Nelson is a 88 y.o. female presenting for a follow up visit. We last saw her in September 2023. At that time, we started her on suppressive dosing of antihistamines and obtained labs.   She was monosenzitied to cockroach. Alpha gla was barely positive at 0.29.   Itching is not much better. She has tried using the cream that we gave her. She is using Allegra. She has not eaten red meat in years, so this was not related. She has seen Dermatology but nothing was really done. Apparently the Dermatologist has ordered something that is going to arrive at the house.   Review of the notes show that this is related to Xolair. She is scheduled to have the medication delivered to her home. Then she was instructed to bring it to either a Dermatology office in [facility] or [place] to have the shot administered. She is not excited about driving to either of those places. I did tell her that we give this all of the time and would be  happy to transfer her script to [place]. She is very excited when I tell her that she does not need to drive that far to get the injection. She is very motivated to start the Xolair and will give [place] a call when she gets it.   On another note, she brought in a couple of bed bugs in a medication container. She wanted confirmation that these were indeed bed bugs. I did confirm that they were. She has a guy lined up on Monday to come and spray her house. This is the first time that she has had bed bugs to her knowledge. The current ongoing itching has definitely preceded the emergence of the bed bugs.   Otherwise,  there have been no changes to her past medical history, surgical history, family history, or social history.    Review of Systems  Constitutional: Negative.  Negative for chills, fever, malaise/fatigue and weight loss.  HENT: Negative.  Negative for congestion, ear discharge, ear pain and sinus pain.   Eyes:  Negative for pain, discharge and redness.  Respiratory:  Negative for cough, sputum production, shortness of breath and wheezing.   Cardiovascular: Negative.  Negative for chest pain and palpitations.  Gastrointestinal:  Negative for abdominal pain, constipation, diarrhea, heartburn, nausea and vomiting.  Skin:  Positive for itching and rash.  Neurological:  Negative for dizziness and headaches.  Endo/Heme/Allergies:  Negative for environmental allergies. Does not bruise/bleed easily.       Objective:   Blood pressure 136/84, pulse 82, temperature (!) 97.3 F (36.3 C), temperature source Temporal, resp. rate 17, SpO2 95 %. There is no height or weight on file to calculate BMI.    Physical Exam Vitals reviewed.  Constitutional:      Appearance: She is well-developed.     Comments: Pleasant and talkative.  HENT:     Head: Normocephalic and atraumatic.     Right Ear: Tympanic membrane, ear canal and external ear normal. No drainage, swelling or tenderness. Tympanic membrane is not injected, scarred, erythematous, retracted or bulging.     Left Ear: Tympanic membrane, ear canal and external ear normal. No drainage, swelling or tenderness. Tympanic membrane is not injected, scarred, erythematous, retracted or bulging.     Nose: No nasal deformity, septal deviation, mucosal edema or rhinorrhea.     Right Turbinates: Not enlarged, swollen or pale.     Left Turbinates: Not enlarged, swollen or pale.     Right Sinus: No maxillary sinus tenderness or frontal sinus tenderness.     Left Sinus: No maxillary sinus tenderness or frontal sinus tenderness.     Mouth/Throat:     Mouth:  Mucous membranes are not pale and not dry.     Pharynx: Uvula midline.  Eyes:     General:        Right eye: No discharge.        Left eye: No discharge.     Conjunctiva/sclera: Conjunctivae normal.     Right eye: Right conjunctiva is not injected. No chemosis.    Left eye: Left conjunctiva is not injected. No chemosis.    Pupils: Pupils are equal, round, and reactive to light.  Cardiovascular:     Rate and Rhythm: Normal rate and regular rhythm.     Heart sounds: Normal heart sounds.  Pulmonary:     Effort: Pulmonary effort is normal. No tachypnea, accessory muscle usage or respiratory distress.     Breath sounds: Normal breath sounds. No wheezing, rhonchi or rales.  Chest:     Chest wall: No tenderness.  Abdominal:     Tenderness: There is no abdominal tenderness. There is no guarding or rebound.  Lymphadenopathy:     Head:     Right side of head: No submandibular, tonsillar or occipital adenopathy.     Left side of head: No submandibular, tonsillar or occipital adenopathy.     Cervical: No cervical adenopathy.  Skin:    General: Skin is warm.     Capillary Refill: Capillary refill takes less than 2 seconds.     Coloration: Skin is not pale.     Findings: Rash present. No abrasion, erythema or petechiae. Rash is urticarial. Rash is not papular or vesicular.     Comments: Multiple urticaria over the arms as well as the neck. The neck is the most prominent location today.  Neurological:     Mental Status: She is alert.  Psychiatric:        Behavior: Behavior is cooperative.      Diagnostic studies: none       Salvatore Marvel, MD  Allergy and [place] [facility] [place]

## 2022-11-29 NOTE — Patient Instructions (Addendum)
1. Chronic urticaria - We will give you the Xolair here. - Call us when you get it in and we can make an appointment for your injection. - You do not need to be driving to [facility] or [facility] for these injections. - We have hundreds of patients on Xolair.  - Continue with your antihistamines for now.  - I am glad that the exterminator is already coming out on Monday. - I am sure that the bedbugs are not helping with the itching!   2. Return in about 3 months (around 02/28/2023).    Please inform us of any Emergency Department visits, hospitalizations, or changes in symptoms. Call us before going to the ED for breathing or allergy symptoms since we might be able to fit you in for a sick visit. Feel free to contact us anytime with any questions, problems, or concerns.  It was a pleasure to see you today!  Websites that have reliable patient information: 1. American Academy of Asthma, Allergy, and Immunology: www.aaaai.org 2. Food Allergy Research and Education (FARE): foodallergy.org 3. Mothers of Asthmatics: http://www.asthmacommunitynetwork.org 4. American College of Allergy, Asthma, and Immunology: www.acaai.org   COVID-19 Vaccine Information can be found at: ShippingScam.co.uk For questions related to vaccine distribution or appointments, please email [email or web address] or call [phone].   We realize that you might be concerned about having an allergic reaction to the COVID19 vaccines. To help with that concern, WE ARE OFFERING THE COVID19 VACCINES IN OUR OFFICE! Ask the front desk for dates!     "Like" [place] on Facebook and Instagram for our latest updates!      A healthy democracy works best when [employer] participate! Make sure you are registered to vote! If you have moved or changed any of your contact information, you will need to get this updated before voting!  In some cases, you MAY be able to register  to vote online: [email or web address]

## 2022-11-29 NOTE — Telephone Encounter (Signed)
Called patient and advised that I reached to [facility] and they are going to send her medication to [place] on 12/12 and advised patient to call office that afternoon to see if it comes in and can schedule appt for 12/13 in [place] to start therapy

## 2022-11-29 NOTE — Telephone Encounter (Signed)
-----   Message from Valentina Shaggy, MD sent at 11/29/2022 12:22 PM EST ----- This is that patient I called you about, Tam Tam! She I wanting to get her Xolair in [place]!

## 2022-12-03 NOTE — Telephone Encounter (Signed)
Thank you so much, Tammy!   Salvatore Marvel, MD Allergy and [place] [facility] [place]

## 2022-12-10 ENCOUNTER — Ambulatory Visit (INDEPENDENT_AMBULATORY_CARE_PROVIDER_SITE_OTHER): Payer: Medicare PPO | Admitting: Internal Medicine

## 2022-12-10 ENCOUNTER — Encounter: Payer: Self-pay | Admitting: Internal Medicine

## 2022-12-10 DIAGNOSIS — J0100 Acute maxillary sinusitis, unspecified: Secondary | ICD-10-CM | POA: Diagnosis not present

## 2022-12-10 DIAGNOSIS — J209 Acute bronchitis, unspecified: Secondary | ICD-10-CM

## 2022-12-10 MED ORDER — ALBUTEROL SULFATE HFA 108 (90 BASE) MCG/ACT IN AERS: 2.0000 | INHALATION_SPRAY | Freq: Four times a day (QID) | RESPIRATORY_TRACT | 0 refills | Status: DC | PRN

## 2022-12-10 MED ORDER — BENZONATATE 100 MG PO CAPS: 100.0000 mg | ORAL_CAPSULE | Freq: Two times a day (BID) | ORAL | 0 refills | Status: DC | PRN

## 2022-12-10 MED ORDER — AZITHROMYCIN 250 MG PO TABS: ORAL_TABLET | ORAL | 0 refills | Status: AC

## 2022-12-10 NOTE — Progress Notes (Signed)
Virtual Visit via Telephone Note   This visit type was conducted via telephone. This format is felt to be most appropriate for this patient at this time.  The patient did not have access to video technology/had technical difficulties with video requiring transitioning to audio format only (telephone).  All issues noted in this document were discussed and addressed.  No physical exam could be performed with this format.  Evaluation Performed:  Follow-up visit  Date:  12/10/2022   ID:  Kelsey Nelson, DOB 23-Feb-1934, MRN [id number]  Patient Location: Home Provider Location: Office/Clinic  Participants: Patient Location of Patient: Home Location of Provider: Telehealth Consent was obtain for visit to be over via telehealth. I verified that I am speaking with the correct person using two identifiers.  PCP:  Fayrene Helper, MD   Chief Complaint: Cough, nasal congestion and fatigue  History of Present Illness:    Kelsey Nelson is a 88 y.o. female who has televisit for complaint of cough, nasal congestion and fatigue for the last 1 week, but has progressed in the last 2 days.  She denies any fever or chills.  She does have dyspnea upon minimal exertion.  She has history of allergic rhinitis, but states that her symptoms are worse compared to her baseline.  She has tried multiple OTC cold and sinus medicines without much relief.  The patient does not have symptoms concerning for COVID-19 infection (fever, chills, cough, or new shortness of breath).   Past Medical, Surgical, Social History, Allergies, and Medications have been Reviewed.  Past Medical History:  Diagnosis Date   Acute cystitis with hematuria 10/26/2013   Allergic rhinitis 11/08/2017   Blindness of right eye    BMI between 19-24,adult 2010 145 LBS   Diverticula of small intestine Dec 2010   Diverticulosis    [place]   DIVERTICULOSIS, SIGMOID COLON 03/31/2008   Qualifier: Diagnosis of  By: Dierdre Harness  Noted on  abdominal and pelvic scan in 04/2011.  Extensive colonic diverticulosis , and possible muscular hypertrophy at rectosigmoid junction    Family hx of colon cancer 11/09/2013   Functional constipation 2010   GERD (gastroesophageal reflux disease)    HTN (hypertension)    Hyperlipemia    Pancreatic cyst 02/05/2013   Renal cyst 05/27/2011   Right eye trauma 1949   Loss of vision resulted at age 77   Schatzki's ring Dec 2010   Skin lesions, generalized 06/19/2015   Tubular adenoma 11/09/2013   Past Surgical History:  Procedure Laterality Date   ABDOMINAL HYSTERECTOMY     BREAST REDUCTION SURGERY  1992 BIL   COLONOSCOPY  DEC 2010   SIMPLE ADENOMAS (6-8),  TICS, SML IH   COLONOSCOPY N/A 12/08/2013   Procedure: COLONOSCOPY;  Surgeon: Rogene Houston, MD;  [facility]: [facility];  Service: Endoscopy;  Laterality: N/A;  225   EVISCERATION Right 05/08/2015   Procedure: EVISCERATION WITH IMPLANT RIGHT EYE;  Surgeon: Williams Che, MD;  Location: [facility];  Service: Ophthalmology;  Laterality: Right;   EYE SURGERY  RIGHT 2005   EYE SURGERY  right 2016   removed and false eye placed in 08/2015   TOTAL ABDOMINAL HYSTERECTOMY W/ BILATERAL SALPINGOOPHORECTOMY  1985 FIBROIDS   UPPER GASTROINTESTINAL ENDOSCOPY  DEC 2010   Bx-MILD GASTRITIS/DUODENITIS, DUODENAL DIVERTICULA, SCHATZKI'S RING     Current Meds  Medication Sig   EPINEPHrine (EPIPEN JR) 0.15 MG/0.3ML injection Inject into the muscle.   levocetirizine (XYZAL) 5 MG tablet  Take 1 tablet by mouth every evening.     Allergies:   Codeine, Crestor [rosuvastatin], and Fosamax [alendronate sodium]   ROS:   Please see the history of present illness.     All other systems reviewed and are negative.   Labs/Other Tests and Data Reviewed:    Recent Labs: 09/17/2022: ALT 18; BUN 17; Creatinine, Ser 1.10; Hemoglobin 13.6; Platelets 228; Potassium 4.6; Sodium 138; TSH 3.160   Recent Lipid Panel Lab Results  Component Value Date/Time   CHOL  300 (H) 09/17/2022 08:27 AM   TRIG 151 (H) 09/17/2022 08:27 AM   HDL 91 09/17/2022 08:27 AM   CHOLHDL 3.3 09/17/2022 08:27 AM   CHOLHDL 2.5 10/01/2019 07:26 AM   LDLCALC 182 (H) 09/17/2022 08:27 AM   LDLCALC 122 (H) 10/01/2019 07:26 AM    Wt Readings from Last 3 Encounters:  11/21/22 129 lb 3.2 oz (58.6 kg)  10/28/22 131 lb 1.9 oz (59.5 kg)  09/18/22 126 lb (57.2 kg)     ASSESSMENT & PLAN:    Acute bronchitis Started empiric azithromycin for acute sinusitis coverage Tessalon as needed for cough Albuterol as needed for dyspnea or wheezing If persistent, will need office visit for evaluation - can consider oral steroids Robitussin DM as needed for cough  Time:   Today, I have spent 12 minutes reviewing the chart, including problem list, medications, and with the patient with telehealth technology discussing the above problems.   Medication Adjustments/Labs and Tests Ordered: Current medicines are reviewed at length with the patient today.  Concerns regarding medicines are outlined above.   Tests Ordered: No orders of the defined types were placed in this encounter.   Medication Changes: No orders of the defined types were placed in this encounter.    Note: This dictation was prepared with Dragon dictation along with smaller phrase technology. Similar sounding words can be transcribed inadequately or may not be corrected upon review. Any transcriptional errors that result from this process are unintentional.      Disposition:  Follow up  Signed, Lindell Spar, MD  12/10/2022 11:04 AM     [place]

## 2022-12-11 ENCOUNTER — Telehealth: Payer: Self-pay | Admitting: Family Medicine

## 2022-12-11 NOTE — Telephone Encounter (Signed)
Attempted to speak directly with daughter , Anderson Malta regarding note she had left re starting pt on zoloft, left her a message to call back

## 2022-12-13 ENCOUNTER — Ambulatory Visit (INDEPENDENT_AMBULATORY_CARE_PROVIDER_SITE_OTHER): Payer: Medicare PPO | Admitting: Family Medicine

## 2022-12-13 ENCOUNTER — Encounter: Payer: Self-pay | Admitting: Family Medicine

## 2022-12-13 ENCOUNTER — Ambulatory Visit
Admission: EM | Admit: 2022-12-13 | Discharge: 2022-12-13 | Disposition: A | Discharge status: Home / Self Care | Payer: Medicare PPO

## 2022-12-13 VITALS — BP 142/82 | HR 84 | Ht 65.0 in | Wt 124.0 lb

## 2022-12-13 DIAGNOSIS — R682 Dry mouth, unspecified: Secondary | ICD-10-CM

## 2022-12-13 DIAGNOSIS — K117 Disturbances of salivary secretion: Secondary | ICD-10-CM | POA: Diagnosis not present

## 2022-12-13 NOTE — ED Triage Notes (Signed)
Pt reports cough x 4 days; dry tongue since this morning

## 2022-12-13 NOTE — Progress Notes (Signed)
Acute Office Visit  Subjective:    Patient ID: Kelsey Nelson, female    DOB: Feb 22, 1934, 88 y.o.   MRN: [id number]  Chief Complaint  Patient presents with   dry mouth    Patient states her mouth is very dry. Went to UC and they sent her to [place]. She thinks she finished azythromycin too early.    HPI Patient is in today with complaints of dry mouth.  She was last seen on 12/10/2022 and treated for acute bronchitis.  The patient was given azithromycin for acute sinusitis coverage, an albuterol inhaler as needed for dyspnea or wheezing, Tessalon Perles, and Robitussin DM for cough.  She reports not using her albuterol inhaler properly, noting that the medication stays in her mouth rather than entering her lungs.  Past Medical History:  Diagnosis Date   Acute cystitis with hematuria 10/26/2013   Allergic rhinitis 11/08/2017   Blindness of right eye    BMI between 19-24,adult 2010 145 LBS   Diverticula of small intestine Dec 2010   Diverticulosis    [place]   DIVERTICULOSIS, SIGMOID COLON 03/31/2008   Qualifier: Diagnosis of  By: Kelsey Nelson  Noted on abdominal and pelvic scan in 04/2011.  Extensive colonic diverticulosis , and possible muscular hypertrophy at rectosigmoid junction    Family hx of colon cancer 11/09/2013   Functional constipation 2010   GERD (gastroesophageal reflux disease)    HTN (hypertension)    Hyperlipemia    Pancreatic cyst 02/05/2013   Renal cyst 05/27/2011   Right eye trauma 1949   Loss of vision resulted at age 51   Schatzki's ring Dec 2010   Skin lesions, generalized 06/19/2015   Tubular adenoma 11/09/2013    Past Surgical History:  Procedure Laterality Date   ABDOMINAL HYSTERECTOMY     BREAST REDUCTION SURGERY  1992 BIL   COLONOSCOPY  DEC 2010   SIMPLE ADENOMAS (6-8), [place] TICS, SML IH   COLONOSCOPY N/A 12/08/2013   Procedure: COLONOSCOPY;  Surgeon: Kelsey Houston, MD;  [facility];  Service: Endoscopy;  Laterality: N/A;  225   EVISCERATION  Right 05/08/2015   Procedure: EVISCERATION WITH IMPLANT RIGHT EYE;  Surgeon: Kelsey Che, MD;  Location: [facility];  Service: Ophthalmology;  Laterality: Right;   EYE SURGERY  RIGHT 2005   EYE SURGERY  right 2016   removed and false eye placed in 08/2015   TOTAL ABDOMINAL HYSTERECTOMY W/ BILATERAL SALPINGOOPHORECTOMY  1985 FIBROIDS   UPPER GASTROINTESTINAL ENDOSCOPY  DEC 2010   Bx-MILD GASTRITIS/DUODENITIS, DUODENAL DIVERTICULA, SCHATZKI'S RING    Family History  Problem Relation Age of Onset   Pancreatic cancer Sister    Colon cancer Paternal Aunt    Colon polyps Neg Hx     Social History   Socioeconomic History   Marital status: Married    Spouse name: Kelsey Nelson    Number of children: 3   Years of education: 12   Highest education level: 12th grade  Occupational History   Occupation: retired   Tobacco Use   Smoking status: Never   Smokeless tobacco: Never  Vaping Use   Vaping Use: Never used  Substance and Sexual Activity   Alcohol use: No    Alcohol/week: 0.0 standard drinks of alcohol   Drug use: No   Sexual activity: Not Currently  Other Topics Concern   Not on file  Social History Narrative   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/   Social Determinants of Health  Financial Resource Strain: Low Risk  (01/22/2022)   Overall Financial Resource Strain (CARDIA)    Difficulty of Paying Living Expenses: Not hard at all  Food Insecurity: No Food Insecurity (01/22/2022)   Hunger Vital Sign    Worried About Running Out of Food in the Last Year: Never true    Ran Out of Food in the Last Year: Never true  Transportation Needs: No Transportation Needs (01/22/2022)   PRAPARE - Hydrologist (Medical): No    Lack of Transportation (Non-Medical): No  Physical Activity: Sufficiently Active (01/22/2022)   Exercise Vital Sign    Days of Exercise per Week: 5 days    Minutes of Exercise per Session: 60 min  Stress: No Stress Concern Present  (01/22/2022)   [name]    Feeling of Stress : Not at all  Social Connections: [place] (01/22/2022)   Social Connection and Isolation Panel [NHANES]    Frequency of Communication with Friends and Family: Never    Frequency of Social Gatherings with Friends and Family: More than three times a week    Attends Religious Services: More than 4 times per year    Active Member of [employer] or Organizations: Yes    Attends Music therapist: More than 4 times per year    Marital Status: Married  Human resources officer Violence: Not At Risk (01/22/2022)   Humiliation, Afraid, Rape, and Kick questionnaire    Fear of Current or Ex-Partner: No    Emotionally Abused: No    Physically Abused: No    Sexually Abused: No    Outpatient Medications Prior to Visit  Medication Sig Dispense Refill   albuterol (VENTOLIN HFA) 108 (90 Base) MCG/ACT inhaler Inhale 2 puffs into the lungs every 6 (six) hours as needed for wheezing or shortness of breath. 8 g 0   azithromycin (ZITHROMAX) 250 MG tablet Take 2 tablets on day 1, then 1 tablet daily on days 2 through 5 6 tablet 0   B Complex Vitamins (VITAMIN-B COMPLEX PO) Take 1 tablet by mouth daily.     benzonatate (TESSALON) 100 MG capsule Take 1 capsule (100 mg total) by mouth 2 (two) times daily as needed for cough. 20 capsule 0   Calcium Carbonate Antacid (TUMS PO) Take 1 tablet by mouth 3 (three) times daily as needed.     capsaicin (ZOSTRIX) 0.025 % cream Apply topically 2 (two) times daily. 60 g 0   Cholecalciferol (VITAMIN D3 PO) Take 25 mcg by mouth daily.     conjugated estrogens (PREMARIN) vaginal cream Apply one fingertip to introitus every 2 days. 42.5 g 5   diphenhydrAMINE (BENADRYL ALLERGY) 25 mg capsule Take 1 capsule (25 mg total) by mouth every 6 (six) hours as needed. 30 capsule 0   EPINEPHrine (EPIPEN JR) 0.15 MG/0.3ML injection Inject into the muscle.     hydrOXYzine  (ATARAX) 10 MG tablet Take one to two tablets at bedtime for sleep and for itching 60 tablet 3   levocetirizine (XYZAL) 5 MG tablet Take 1 tablet by mouth every evening.     methenamine (HIPREX) 1 g tablet Take 1 tablet (1 g total) by mouth at bedtime. 90 tablet 3   OVER THE COUNTER MEDICATION Multi Collagen Protein - Patient states that she takes 1 scoop a day.     polyethylene glycol powder (GLYCOLAX/MIRALAX) 17 GM/SCOOP powder Take 17 g by mouth daily as needed. 3350 g 1  pravastatin (PRAVACHOL) 10 MG tablet Take 1 tablet by mouth daily at 12 noon.     Probiotic Product (PROBIOTIC DAILY PO) Take 1 tablet by mouth daily.     ramipril (ALTACE) 10 MG capsule TAKE (1) CAPSULE BY MOUTH ONCE DAILY. 90 capsule 0   triamcinolone cream (KENALOG) 0.1 % Apply 1 Application topically as directed.     vitamin C (ASCORBIC ACID) 500 MG tablet Take 500 mg by mouth daily.     XOLAIR 150 MG/ML prefilled syringe Inject into the skin.     Zinc 50 MG TABS Take 1 tablet by mouth daily.     famotidine (PEPCID) 20 MG tablet Take 1 tablet (20 mg total) by mouth 2 (two) times daily. 60 tablet 3   fexofenadine (ALLEGRA ALLERGY) 180 MG tablet Take 1 tablet (180 mg total) by mouth every morning. 30 tablet 3   No facility-administered medications prior to visit.    Allergies  Allergen Reactions   Codeine    Crestor [Rosuvastatin] Other (See Comments)    Can't recall   Fosamax [Alendronate Sodium]     heartburn    Review of Systems  Constitutional:  Negative for chills and fever.  HENT:         Dry mouth  Eyes:  Negative for visual disturbance.  Respiratory:  Negative for chest tightness and shortness of breath.   Neurological:  Negative for dizziness and headaches.       Objective:    Physical Exam HENT:     Head: Normocephalic.     Mouth/Throat:     Mouth: Mucous membranes are moist.     Pharynx: Oropharynx is clear. No oropharyngeal exudate or posterior oropharyngeal erythema.  Cardiovascular:      Rate and Rhythm: Normal rate.     Heart sounds: Normal heart sounds.  Pulmonary:     Effort: Pulmonary effort is normal.     Breath sounds: Normal breath sounds.  Neurological:     Mental Status: She is alert.     BP (!) 142/82   Pulse 84   Ht '5\' 5"'$  (1.651 m)   Wt 124 lb (56.2 kg)   SpO2 98%   BMI 20.63 kg/m  Wt Readings from Last 3 Encounters:  12/13/22 124 lb (56.2 kg)  11/21/22 129 lb 3.2 oz (58.6 kg)  10/28/22 131 lb 1.9 oz (59.5 kg)       Assessment & Plan:  Xerostomia Assessment & Plan: Likely a side effect of her albuterol inhaler given improper use Reviewed how to use her inhaler properly  Inform the patient to rinse her mouth after using the inhaler  Dry mouth is a common side effects of using albuterol inhaler Make sure to rinse your mouth, or brush your teeth after using your inhaler Maintain good hydration by taking regular sips of water, drinking sugar-free liquids, and avoidance of oral irritants (eg, coffee, alcohol, and nicotine) Supportive care recommendations for dry mouth: - use of throat lozenges or cough drops, increasing her fluid intake (5-6 bottles of water daily), or always having a piece of hard candy in her mouth to increase salivation. keeping cough drops, throat lozenges, or hard candy in your mouth while symptoms persist.  This will allow you to increase the saliva in your mouth. Make sure you are performing good dental hygiene with use of mouthwash     Alvira Monday, FNP

## 2022-12-13 NOTE — Discharge Instructions (Signed)
As discussed, continue to increase your water intake.  Try to drink at least 5-6 bottles of water daily. Also recommend keeping cough drops, throat lozenges, or hard candy in your mouth while symptoms persist.  This will allow you to increase the saliva in your mouth. Make sure you are performing good dental hygiene with use of mouthwash. If symptoms fail to improve, or if they continue to worsen, please follow-up with your primary care physician for further evaluation. Follow-up as needed.

## 2022-12-13 NOTE — Patient Instructions (Addendum)
I appreciate the opportunity to provide care to you today!    Follow up:  Dr. Moshe Cipro  How to use your Albuterol inhaler      Dry mouth is a common side effects of using albuterol inhaler Make sure to rinse your mouth, or brush your teeth after using your inhaler Maintain good hydration by taking regular sips of water, drinking sugar-free liquids, and avoidance of oral irritants (eg, coffee, alcohol, and nicotine) Supportive care recommendations for dry mouth: - use of throat lozenges or cough drops, increasing her fluid intake (5-6 bottles of water daily), or always having a piece of hard candy in her mouth to increase salivation. keeping cough drops, throat lozenges, or hard candy in your mouth while symptoms persist.  This will allow you to increase the saliva in your mouth. Make sure you are performing good dental hygiene with use of mouthwash   Please continue to a heart-healthy diet and increase your physical activities. Try to exercise for 35mns at least five times a week.      It was a pleasure to see you and I look forward to continuing to work together on your health and well-being. Please do not hesitate to call the office if you need care or have questions about your care.   Have a wonderful day and week. With Gratitude, GAlvira MondayMSN, FNP-BC

## 2022-12-13 NOTE — Assessment & Plan Note (Signed)
Likely a side effect of her albuterol inhaler given improper use Reviewed how to use her inhaler properly  Inform the patient to rinse her mouth after using the inhaler  Dry mouth is a common side effects of using albuterol inhaler Make sure to rinse your mouth, or brush your teeth after using your inhaler Maintain good hydration by taking regular sips of water, drinking sugar-free liquids, and avoidance of oral irritants (eg, coffee, alcohol, and nicotine) Supportive care recommendations for dry mouth: - use of throat lozenges or cough drops, increasing her fluid intake (5-6 bottles of water daily), or always having a piece of hard candy in her mouth to increase salivation. keeping cough drops, throat lozenges, or hard candy in your mouth while symptoms persist.  This will allow you to increase the saliva in your mouth. Make sure you are performing good dental hygiene with use of mouthwash

## 2022-12-13 NOTE — ED Provider Notes (Signed)
[facility]    CSN: [id number] Arrival date & time: 12/13/22  0800      History   Chief Complaint Chief Complaint  Patient presents with   Cough    HPI Kelsey Nelson is a 88 y.o. female.   The history is provided by the patient.   The patient presents for complaints of cough and a dry tongue.  Patient was recently treated by her primary care physician on 12/10/2022 and diagnosed with acute bronchitis.  She was prescribed azithromycin, Tessalon Perles, and benzonatate.  She states that her cough symptoms have improved, but that she continues to have a worsening dry tongue.  She states this has been going on "for years" but is worse this morning.  She states that she has also finished the azithromycin that was prescribed 3 days ago.  Patient denies new fever, chills, wheezing, shortness of breath, difficulty breathing.  She reports she has been increasing her fluids since her symptoms worsen.  Patient states since she has had COVID, she has had loss of taste.    Past Medical History:  Diagnosis Date   Acute cystitis with hematuria 10/26/2013   Allergic rhinitis 11/08/2017   Blindness of right eye    BMI between 19-24,adult 2010 145 LBS   Diverticula of small intestine Dec 2010   Diverticulosis    [place]   DIVERTICULOSIS, SIGMOID COLON 03/31/2008   Qualifier: Diagnosis of  By: Dierdre Harness  Noted on abdominal and pelvic scan in 04/2011.  Extensive colonic diverticulosis , and possible muscular hypertrophy at rectosigmoid junction    Family hx of colon cancer 11/09/2013   Functional constipation 2010   GERD (gastroesophageal reflux disease)    HTN (hypertension)    Hyperlipemia    Pancreatic cyst 02/05/2013   Renal cyst 05/27/2011   Right eye trauma 1949   Loss of vision resulted at age 68   Schatzki's ring Dec 2010   Skin lesions, generalized 06/19/2015   Tubular adenoma 11/09/2013    Patient Active Problem List   Diagnosis Date Noted   Generalized rash 09/23/2022    CKD (chronic kidney disease) stage 3, GFR 30-59 ml/min (HCC) 09/23/2022   Acute allergic reaction 08/01/2022   Allergic dermatitis 07/19/2022   Food allergy 07/19/2022   Rash and other nonspecific skin eruption 06/21/2022   Sinusitis 06/03/2022   Skin lesions 01/22/2022   Abdominal spasms 01/22/2022   Blepharitis of left lower eyelid 10/28/2021   Recurrent UTI 05/10/2021   Skipped heart beats 10/19/2020   Dermatitis 07/31/2020   COVID-19 virus infection 02/25/2020   Anterior knee pain, right 10/05/2019   Belching symptom 10/05/2019   Injury of left knee 11/28/2017   Annual physical exam 03/23/2016   Ectropion of right eye 11/22/2015   Acquired superior sulcus deformity of orbit 07/04/2015   History of evisceration of eye 07/04/2015   Nuclear sclerosis of left eye 07/04/2015   Urinary urgency 10/31/2013   Loss of taste 07/31/2012   Pancreatic cyst 04/21/2012   H/O gastroesophageal reflux (GERD) 04/21/2012   Nocturia 12/02/2011   Insomnia 09/20/2011   Nausea 06/22/2011   Constipation 06/21/2011   GERD 11/20/2009   Mixed hyperlipidemia 03/31/2008   BLINDNESS, RIGHT EYE 03/31/2008   HTN (hypertension) 03/31/2008   Osteoporosis 03/31/2008    Past Surgical History:  Procedure Laterality Date   ABDOMINAL HYSTERECTOMY     BREAST REDUCTION SURGERY  1992 BIL   COLONOSCOPY  DEC 2010   SIMPLE ADENOMAS (6-8), [place] TICS, SML IH  COLONOSCOPY N/A 12/08/2013   Procedure: COLONOSCOPY;  Surgeon: Rogene Houston, MD;  Location: [facility];  Service: Endoscopy;  Laterality: N/A;  225   EVISCERATION Right 05/08/2015   Procedure: EVISCERATION WITH IMPLANT RIGHT EYE;  Surgeon: Williams Che, MD;  Location: [facility];  Service: Ophthalmology;  Laterality: Right;   EYE SURGERY  RIGHT 2005   EYE SURGERY  right 2016   removed and false eye placed in 08/2015   TOTAL ABDOMINAL HYSTERECTOMY W/ BILATERAL SALPINGOOPHORECTOMY  1985 FIBROIDS   UPPER GASTROINTESTINAL ENDOSCOPY  DEC 2010   Bx-MILD  GASTRITIS/DUODENITIS, DUODENAL DIVERTICULA, SCHATZKI'S RING    OB History     Gravida      Para      Term      Preterm      AB      Living  3      SAB      IAB      Ectopic      Multiple      Live Births               Home Medications    Prior to Admission medications   Medication Sig Start Date End Date Taking? Authorizing Provider  albuterol (VENTOLIN HFA) 108 (90 Base) MCG/ACT inhaler Inhale 2 puffs into the lungs every 6 (six) hours as needed for wheezing or shortness of breath. 12/10/22   Lindell Spar, MD  azithromycin (ZITHROMAX) 250 MG tablet Take 2 tablets on day 1, then 1 tablet daily on days 2 through 5 12/10/22 12/15/22  Lindell Spar, MD  B Complex Vitamins (VITAMIN-B COMPLEX PO) Take 1 tablet by mouth daily.    [provider]  benzonatate (TESSALON) 100 MG capsule Take 1 capsule (100 mg total) by mouth 2 (two) times daily as needed for cough. 12/10/22   Lindell Spar, MD  Calcium Carbonate Antacid (TUMS PO) Take 1 tablet by mouth 3 (three) times daily as needed.    [provider]  capsaicin (ZOSTRIX) 0.025 % cream Apply topically 2 (two) times daily. 10/28/22   Alvira Monday, FNP  Cholecalciferol (VITAMIN D3 PO) Take 25 mcg by mouth daily.    [provider]  conjugated estrogens (PREMARIN) vaginal cream Apply one fingertip to introitus every 2 days. 12/20/20   Perlie Mayo, NP  diphenhydrAMINE (BENADRYL ALLERGY) 25 mg capsule Take 1 capsule (25 mg total) by mouth every 6 (six) hours as needed. 08/06/22   Alvira Monday, FNP  EPINEPHrine (EPIPEN JR) 0.15 MG/0.3ML injection Inject into the muscle. 11/07/22 02/05/23  [provider]  famotidine (PEPCID) 20 MG tablet Take 1 tablet (20 mg total) by mouth 2 (two) times daily. 08/29/22 11/21/22  Valentina Shaggy, MD  fexofenadine [facility] ALLERGY) 180 MG tablet Take 1 tablet (180 mg total) by mouth every morning. 08/29/22 11/21/22  Valentina Shaggy, MD   hydrOXYzine (ATARAX) 10 MG tablet Take one to two tablets at bedtime for sleep and for itching Patient not taking: Reported on 11/29/2022 09/18/22   Fayrene Helper, MD  levocetirizine (XYZAL) 5 MG tablet Take 1 tablet by mouth every evening. 10/18/22   [provider]  methenamine (HIPREX) 1 g tablet Take 1 tablet (1 g total) by mouth at bedtime. 11/22/22   McKenzie, Candee Furbish, MD  OVER THE COUNTER MEDICATION Multi Collagen Protein - Patient states that she takes 1 scoop a day.    [provider]  polyethylene glycol powder (GLYCOLAX/MIRALAX) 17 GM/SCOOP  powder Take 17 g by mouth daily as needed. 06/21/22   Paseda, Dewaine Conger, FNP  pravastatin (PRAVACHOL) 10 MG tablet Take 1 tablet by mouth daily at 12 noon. Patient not taking: Reported on 11/29/2022    [provider]  Probiotic Product (PROBIOTIC DAILY PO) Take 1 tablet by mouth daily. Patient not taking: Reported on 11/29/2022    [provider]  ramipril (ALTACE) 10 MG capsule TAKE (1) CAPSULE BY MOUTH ONCE DAILY. 10/10/22   Fayrene Helper, MD  triamcinolone cream (KENALOG) 0.1 % Apply 1 Application topically as directed. 07/04/22   [provider]  vitamin C (ASCORBIC ACID) 500 MG tablet Take 500 mg by mouth daily.    [provider]  Arvid Right 150 MG/ML prefilled syringe Inject into the skin.    [provider]  Zinc 50 MG TABS Take 1 tablet by mouth daily. Patient not taking: Reported on 11/29/2022    [provider]    Family History Family History  Problem Relation Age of Onset   Pancreatic cancer Sister    Colon cancer Paternal Aunt    Colon polyps Neg Hx     Social History Social History   Tobacco Use   Smoking status: Never   Smokeless tobacco: Never  Vaping Use   Vaping Use: Never used  Substance Use Topics   Alcohol use: No    Alcohol/week: 0.0 standard drinks of alcohol   Drug use: No     Allergies   Codeine, Crestor [rosuvastatin], and  Fosamax [alendronate sodium]   Review of Systems Review of Systems Per HPI  Physical Exam Triage Vital Signs ED Triage Vitals  Enc Vitals Group     BP 12/13/22 0820 (!) 144/81     Pulse Rate 12/13/22 0820 77     Resp 12/13/22 0820 18     Temp 12/13/22 0820 97.8 F (36.6 C)     Temp Source 12/13/22 0820 Oral     SpO2 12/13/22 0820 96 %     Weight --      Height --      Head Circumference --      Peak Flow --      Pain Score 12/13/22 0819 0     Pain Loc --      Pain Edu? --      Excl. in [place]? --    No data found.  Updated Vital Signs BP (!) 144/81 (BP Location: Right Arm)   Pulse 77   Temp 97.8 F (36.6 C) (Oral)   Resp 18   SpO2 96%   Visual Acuity Right Eye Distance:   Left Eye Distance:   Bilateral Distance:    Right Eye Near:   Left Eye Near:    Bilateral Near:     Physical Exam Vitals and nursing note reviewed.  Constitutional:      General: She is not in acute distress.    Appearance: Normal appearance.  HENT:     Head: Normocephalic.     Right Ear: Tympanic membrane, ear canal and external ear normal.     Left Ear: Tympanic membrane, ear canal and external ear normal.     Nose: Nose normal.     Mouth/Throat:     Lips: Pink.     Mouth: Mucous membranes are moist.     Tongue: No lesions. Tongue does not deviate from midline.     Pharynx: Uvula midline. Posterior oropharyngeal erythema present. No pharyngeal swelling.  Tonsils: No tonsillar exudate.  Eyes:     Extraocular Movements: Extraocular movements intact.     Conjunctiva/sclera: Conjunctivae normal.     Pupils: Pupils are equal, round, and reactive to light.  Cardiovascular:     Rate and Rhythm: Normal rate and regular rhythm.     Pulses: Normal pulses.     Heart sounds: Normal heart sounds.  Pulmonary:     Effort: Pulmonary effort is normal. No respiratory distress.     Breath sounds: Normal breath sounds. No stridor. No wheezing, rhonchi or rales.  Abdominal:     General: Bowel  sounds are normal.     Palpations: Abdomen is soft.  Musculoskeletal:     Cervical back: Normal range of motion.  Lymphadenopathy:     Cervical: No cervical adenopathy.  Skin:    General: Skin is warm and dry.  Neurological:     General: No focal deficit present.     Mental Status: She is alert and oriented to person, place, and time.  Psychiatric:        Mood and Affect: Mood normal.        Behavior: Behavior normal.      UC Treatments / Results  Labs (all labs ordered are listed, but only abnormal results are displayed) Labs Reviewed - No data to display  EKG   Radiology No results found.  Procedures Procedures (including critical care time)  Medications Ordered in UC Medications - No data to display  Initial Impression / Assessment and Plan / UC Course  I have reviewed the triage vital signs and the nursing notes.  Pertinent labs & imaging results that were available during my care of the patient were reviewed by me and considered in my medical decision making (see chart for details).  The patient is well-appearing, she is in no acute distress, vital signs are stable.  The patient is well-appearing, she is in no acute distress, vital signs are stable.  Difficult to ascertain the cause of the patient's "dry mouth" as this has been going on for several years.  Patient has been started on new medications, cannot rule out this as the cause of her worsening.  Mucous membranes are moist, and pink, there are no obvious signs of dehydration present.  There is no sign of thrush, stomatitis, or other oral lesions noted.  Advised patient that she will need to follow-up with her primary care physician for further evaluation.  Supportive care recommendations were provided to the patient to include use of throat lozenges or cough drops, increasing her fluid intake, or always having a piece of hard candy in her mouth to increase salivation.  Patient verbalizes understanding.  All  questions were answered.  Patient is stable for discharge. Final Clinical Impressions(s) / UC Diagnoses   Final diagnoses:  Dry mouth     Discharge Instructions      As discussed, continue to increase your water intake.  Try to drink at least 5-6 bottles of water daily. Also recommend keeping cough drops, throat lozenges, or hard candy in your mouth while symptoms persist.  This will allow you to increase the saliva in your mouth. Make sure you are performing good dental hygiene with use of mouthwash. If symptoms fail to improve, or if they continue to worsen, please follow-up with your primary care physician for further evaluation. Follow-up as needed.     ED Prescriptions   None    PDMP not reviewed this encounter.   Ephrem Carrick-Warren, Alda Lea,  NP 12/13/22 [id number]

## 2022-12-18 ENCOUNTER — Ambulatory Visit: Payer: Medicare PPO

## 2022-12-20 ENCOUNTER — Telehealth: Payer: Self-pay | Admitting: Family Medicine

## 2022-12-20 NOTE — Telephone Encounter (Signed)
Spoke with patient's daughter. Patient has appt with Dr. Moshe Cipro on 01/01/23

## 2022-12-20 NOTE — Telephone Encounter (Signed)
Daughter called in on patient behalf. Patient has been having issues with sleeping for years. Wants a call back.

## 2022-12-25 MED ORDER — EPINEPHRINE 0.3 MG/0.3ML IJ SOAJ: 0.3000 mg | INTRAMUSCULAR | 1 refills | Status: DC | PRN

## 2022-12-25 NOTE — Telephone Encounter (Signed)
Patient came I today to schedule her appointment for her new start Xolair. Patient expressed that she did not have an Epipen. I sent an Epipen to the pharmacy on file for her to pick up and bring with her on Friday for her new start appointment.

## 2022-12-25 NOTE — Addendum Note (Signed)
Addended by: Clovis Cao A on: 12/25/2022 02:12 PM   Modules accepted: Orders

## 2022-12-27 ENCOUNTER — Ambulatory Visit (INDEPENDENT_AMBULATORY_CARE_PROVIDER_SITE_OTHER): Payer: Medicare PPO

## 2022-12-27 DIAGNOSIS — L501 Idiopathic urticaria: Secondary | ICD-10-CM

## 2022-12-27 DIAGNOSIS — L508 Other urticaria: Secondary | ICD-10-CM

## 2022-12-27 MED ORDER — OMALIZUMAB 150 MG ~~LOC~~ SOLR
150.0000 mg | SUBCUTANEOUS | Status: DC
  Administered 2022-12-27 – 2023-04-21 (×5): 150 mg via SUBCUTANEOUS

## 2022-12-27 NOTE — Progress Notes (Signed)
Immunotherapy   Patient Details  Name: Kelsey Nelson MRN: [id number] Date of Birth: 20-Oct-1934  12/27/2022  [name] started injections for  Xolair Following schedule: Every twenty eight days.  Frequency: Every four weeks.  Epi-Pen:Epi-Pen Available  Consent signed previously and patient instructions given. Patient waited in the lobby for thirty minutes without an issue.    Julius Bowels 12/27/2022, 11:19 AM

## 2023-01-01 ENCOUNTER — Ambulatory Visit (INDEPENDENT_AMBULATORY_CARE_PROVIDER_SITE_OTHER): Payer: Medicare PPO | Admitting: Family Medicine

## 2023-01-01 ENCOUNTER — Encounter: Payer: Self-pay | Admitting: Family Medicine

## 2023-01-01 VITALS — BP 131/76 | HR 79 | Ht 65.0 in | Wt 125.0 lb

## 2023-01-01 DIAGNOSIS — F321 Major depressive disorder, single episode, moderate: Secondary | ICD-10-CM

## 2023-01-01 DIAGNOSIS — E782 Mixed hyperlipidemia: Secondary | ICD-10-CM

## 2023-01-01 DIAGNOSIS — I10 Essential (primary) hypertension: Secondary | ICD-10-CM

## 2023-01-01 DIAGNOSIS — F5104 Psychophysiologic insomnia: Secondary | ICD-10-CM | POA: Diagnosis not present

## 2023-01-01 MED ORDER — SERTRALINE HCL 25 MG PO TABS: 25.0000 mg | ORAL_TABLET | Freq: Every day | ORAL | 3 refills | Status: DC

## 2023-01-01 NOTE — Patient Instructions (Signed)
F/U in 8 to 10 weeks , re evaluate sleep and depression, call if you need me sooner  New medication is sertraline 25 mg 1 daily.  Please reduce fried and fatty foods.  Fasting lipid CMP and EGFR to be obtained 3 to 5 days before next visit.  Best for 2024!  Thanks for choosing [facility], we consider it a privelige to serve you.

## 2023-01-07 ENCOUNTER — Encounter: Payer: Self-pay | Admitting: Family Medicine

## 2023-01-07 DIAGNOSIS — F321 Major depressive disorder, single episode, moderate: Secondary | ICD-10-CM | POA: Insufficient documentation

## 2023-01-07 NOTE — Assessment & Plan Note (Signed)
Sleep hygiene reviewed , treating depression with sertraline

## 2023-01-07 NOTE — Assessment & Plan Note (Signed)
DASH diet and commitment to daily physical activity for a minimum of 30 minutes discussed and encouraged, as a part of hypertension management. The importance of attaining a healthy weight is also discussed.     01/01/2023    9:21 AM 12/13/2022    9:12 AM 12/13/2022    9:10 AM 12/13/2022    8:20 AM 11/29/2022   10:18 AM 11/29/2022    9:48 AM 11/21/2022   12:17 PM  BP/Weight  Systolic BP 158 727 618 485 [phone]  Diastolic BP 76 82 84 81 84 76 75  Wt. (Lbs) 125  124    129.2  BMI 20.8 kg/m2  20.63 kg/m2    21.5 kg/m2     Controlled, no change in medication

## 2023-01-07 NOTE — Assessment & Plan Note (Signed)
Hyperlipidemia:Low fat diet discussed and encouraged.   Lipid Panel  Lab Results  Component Value Date   CHOL 300 (H) 09/17/2022   HDL 91 09/17/2022   LDLCALC 182 (H) 09/17/2022   TRIG 151 (H) 09/17/2022   CHOLHDL 3.3 09/17/2022     ' Uncontrolled  Updated lab needed at/ before next visit.

## 2023-01-07 NOTE — Progress Notes (Signed)
   Kelsey Nelson     MRN: [id number]      DOB: 01-Sep-1934   HPI Kelsey Nelson is here for follow up and re-evaluation of chronic medical conditions, medication management and review of any available recent lab and radiology data.  Preventive health is updated, specifically  Cancer screening and Immunization.   Daughter accompanies her , concerned about untreated depression and insomnia  ROS Denies recent fever or chills. Denies sinus pressure, nasal congestion, ear pain or sore throat. Denies chest congestion, productive cough or wheezing. Denies chest pains, palpitations and leg swelling Denies abdominal pain, nausea, vomiting,diarrhea or constipation.   Denies dysuria, frequency, hesitancy or incontinence. Denies joint pain, swelling and limitation in mobility. Denies headaches, seizures, numbness, or tingling. Denies skin break down or rash.   PE  BP 131/76 (BP Location: Right Arm, Patient Position: Sitting, Cuff Size: Normal)   Pulse 79   Ht '5\' 5"'$  (1.651 m)   Wt 125 lb (56.7 kg)   SpO2 94%   BMI 20.80 kg/m   Patient alert and oriented and in no cardiopulmonary distress.  HEENT: No facial asymmetry, EOMI,     Neck supple .  Chest: Clear to auscultation bilaterally.  CVS: S1, S2 no murmurs, no S3.Regular rate.  ABD: Soft non tender.   Ext: No edema  MS: Adequate ROM spine, shoulders, hips and knees.  Skin: Intact, no ulcerations or rash noted.  Psych: Good eye contact, normal affect. Memory intact not anxious or depressed appearing.  CNS: CN 2-12 intact, power,  normal throughout.no focal deficits noted.   Assessment & Plan  Moderate major depression, single episode (HCC) Start sertraline 25 mg daily, re eval in 8 weeks  Insomnia Sleep hygiene reviewed , treating depression with sertraline  HTN (hypertension) DASH diet and commitment to daily physical activity for a minimum of 30 minutes discussed and encouraged, as a part of hypertension management. The  importance of attaining a healthy weight is also discussed.     01/01/2023    9:21 AM 12/13/2022    9:12 AM 12/13/2022    9:10 AM 12/13/2022    8:20 AM 11/29/2022   10:18 AM 11/29/2022    9:48 AM 11/21/2022   12:17 PM  BP/Weight  Systolic BP 371 062 694 854 627 035 009  Diastolic BP 76 82 84 81 84 76 75  Wt. (Lbs) 125  124    129.2  BMI 20.8 kg/m2  20.63 kg/m2    21.5 kg/m2     Controlled, no change in medication   Mixed hyperlipidemia Hyperlipidemia:Low fat diet discussed and encouraged.   Lipid Panel  Lab Results  Component Value Date   CHOL 300 (H) 09/17/2022   HDL 91 09/17/2022   LDLCALC 182 (H) 09/17/2022   TRIG 151 (H) 09/17/2022   CHOLHDL 3.3 09/17/2022     ' Uncontrolled  Updated lab needed at/ before next visit.

## 2023-01-07 NOTE — Assessment & Plan Note (Signed)
Start sertraline 25 mg daily, re eval in 8 weeks

## 2023-01-08 ENCOUNTER — Ambulatory Visit: Payer: Medicare PPO | Admitting: Urology

## 2023-01-08 VITALS — BP 156/88 | HR 79

## 2023-01-08 DIAGNOSIS — N3281 Overactive bladder: Secondary | ICD-10-CM | POA: Diagnosis not present

## 2023-01-08 DIAGNOSIS — N390 Urinary tract infection, site not specified: Secondary | ICD-10-CM | POA: Diagnosis not present

## 2023-01-08 LAB — URINALYSIS, ROUTINE W REFLEX MICROSCOPIC
Bilirubin, UA: NEGATIVE
Glucose, UA: NEGATIVE
Ketones, UA: NEGATIVE
Nitrite, UA: POSITIVE — AB
Protein,UA: NEGATIVE
Specific Gravity, UA: 1.01 (ref 1.005–1.030)
Urobilinogen, Ur: 0.2 mg/dL (ref 0.2–1.0)
pH, UA: 6.5 (ref 5.0–7.5)

## 2023-01-08 LAB — MICROSCOPIC EXAMINATION

## 2023-01-08 MED ORDER — NITROFURANTOIN MACROCRYSTAL 50 MG PO CAPS: 50.0000 mg | ORAL_CAPSULE | Freq: Every day | ORAL | 11 refills | Status: DC

## 2023-01-08 MED ORDER — NITROFURANTOIN MONOHYD MACRO 100 MG PO CAPS: 100.0000 mg | ORAL_CAPSULE | Freq: Two times a day (BID) | ORAL | 0 refills | Status: DC

## 2023-01-08 MED ORDER — GEMTESA 75 MG PO TABS: 1.0000 | ORAL_TABLET | Freq: Every day | ORAL | 0 refills | Status: DC

## 2023-01-08 NOTE — Progress Notes (Signed)
post void residual=177

## 2023-01-08 NOTE — Progress Notes (Signed)
01/08/2023 10:02 AM   Kelsey Nelson 01-20-1934 [id number]  Referring provider: Fayrene Helper, MD [address] [place],  [place] [address]  Followup OAb and frequent UTI   HPI: Kelsey Nelson is a 89yo here for followup for OAb and frequent UTI. She has not been diagnosed with a UTI since last visit but UA today is concerning for infection. She has mild dysuria. She is on hiprex. She continues to have urinary frequency and urgency.    PMH: Past Medical History:  Diagnosis Date   Acute cystitis with hematuria 10/26/2013   Allergic rhinitis 11/08/2017   Blindness of right eye    BMI between 19-24,adult 2010 145 LBS   Diverticula of small intestine Dec 2010   Diverticulosis    [place]   DIVERTICULOSIS, SIGMOID COLON 03/31/2008   Qualifier: Diagnosis of  By: Dierdre Harness  Noted on abdominal and pelvic scan in 04/2011.  Extensive colonic diverticulosis , and possible muscular hypertrophy at rectosigmoid junction    Family hx of colon cancer 11/09/2013   Functional constipation 2010   GERD (gastroesophageal reflux disease)    HTN (hypertension)    Hyperlipemia    Pancreatic cyst 02/05/2013   Renal cyst 05/27/2011   Right eye trauma 1949   Loss of vision resulted at age 29   Schatzki's ring Dec 2010   Skin lesions, generalized 06/19/2015   Tubular adenoma 11/09/2013    Surgical History: Past Surgical History:  Procedure Laterality Date   ABDOMINAL HYSTERECTOMY     BREAST REDUCTION SURGERY  1992 BIL   COLONOSCOPY  DEC 2010   SIMPLE ADENOMAS (6-8),  TICS, SML IH   COLONOSCOPY N/A 12/08/2013   Procedure: COLONOSCOPY;  Surgeon: Rogene Houston, MD;  [facility]: [facility];  Service: Endoscopy;  Laterality: N/A;  225   EVISCERATION Right 05/08/2015   Procedure: EVISCERATION WITH IMPLANT RIGHT EYE;  Surgeon: Williams Che, MD;  Location: [facility];  Service: Ophthalmology;  Laterality: Right;   EYE SURGERY  RIGHT 2005   EYE SURGERY  right 2016   removed and false eye  placed in 08/2015   TOTAL ABDOMINAL HYSTERECTOMY W/ BILATERAL SALPINGOOPHORECTOMY  1985 FIBROIDS   UPPER GASTROINTESTINAL ENDOSCOPY  DEC 2010   Bx-MILD GASTRITIS/DUODENITIS, DUODENAL DIVERTICULA, SCHATZKI'S RING    Home Medications:  Allergies as of 01/08/2023       Reactions   Codeine    Crestor [rosuvastatin] Other (See Comments)   Can't recall   Fosamax [alendronate Sodium]    heartburn        Medication List        Accurate as of January 08, 2023 10:02 AM. If you have any questions, ask your nurse or doctor.          albuterol 108 (90 Base) MCG/ACT inhaler Commonly known as: VENTOLIN HFA Inhale 2 puffs into the lungs every 6 (six) hours as needed for wheezing or shortness of breath.   ascorbic acid 500 MG tablet Commonly known as: VITAMIN C Take 500 mg by mouth daily.   capsaicin 0.025 % cream Commonly known as: ZOSTRIX Apply topically 2 (two) times daily.   conjugated estrogens 0.625 MG/GM vaginal cream Commonly known as: PREMARIN Apply one fingertip to introitus every 2 days.   diphenhydrAMINE 25 mg capsule Commonly known as: Benadryl Allergy Take 1 capsule (25 mg total) by mouth every 6 (six) hours as needed. What changed: additional instructions   EPINEPHrine 0.3 mg/0.3 mL Soaj injection Commonly known as:  EpiPen 2-Pak Inject 0.3 mg into the muscle as needed for anaphylaxis.   famotidine 20 MG tablet Commonly known as: PEPCID Take 1 tablet (20 mg total) by mouth 2 (two) times daily.   fexofenadine 180 MG tablet Commonly known as: Allegra Allergy Take 1 tablet (180 mg total) by mouth every morning.   levocetirizine 5 MG tablet Commonly known as: XYZAL Take 1 tablet by mouth every evening.   methenamine 1 g tablet Commonly known as: Hiprex Take 1 tablet (1 g total) by mouth at bedtime.   OVER THE COUNTER MEDICATION Multi Collagen Protein - Patient states that she takes 1 scoop a day.   polyethylene glycol powder 17 GM/SCOOP  powder Commonly known as: GLYCOLAX/MIRALAX Take 17 g by mouth daily as needed.   pravastatin 10 MG tablet Commonly known as: PRAVACHOL Take 1 tablet by mouth daily at 12 noon.   PROBIOTIC DAILY PO Take 1 tablet by mouth daily.   ramipril 10 MG capsule Commonly known as: ALTACE TAKE (1) CAPSULE BY MOUTH ONCE DAILY.   sertraline 25 MG tablet Commonly known as: ZOLOFT Take 1 tablet (25 mg total) by mouth daily.   triamcinolone cream 0.1 % Commonly known as: KENALOG Apply 1 Application topically as directed.   TUMS PO Take 1 tablet by mouth 3 (three) times daily as needed.   VITAMIN D3 PO Take 25 mcg by mouth daily.   VITAMIN-B COMPLEX PO Take 1 tablet by mouth daily.   Xolair 150 MG/ML prefilled syringe Generic drug: omalizumab Inject into the skin.        Allergies:  Allergies  Allergen Reactions   Codeine    Crestor [Rosuvastatin] Other (See Comments)    Can't recall   Fosamax [Alendronate Sodium]     heartburn    Family History: Family History  Problem Relation Age of Onset   Pancreatic cancer Sister    Colon cancer Paternal Aunt    Colon polyps Neg Hx     Social History:  reports that she has never smoked. She has never used smokeless tobacco. She reports that she does not drink alcohol and does not use drugs.  ROS: All other review of systems were reviewed and are negative except what is noted above in HPI  Physical Exam: BP (!) 156/88   Pulse 79   Constitutional:  Alert and oriented, No acute distress. HEENT: [place] AT, moist mucus membranes.  Trachea midline, no masses. Cardiovascular: No clubbing, cyanosis, or edema. Respiratory: Normal respiratory effort, no increased work of breathing. GI: Abdomen is soft, nontender, nondistended, no abdominal masses GU: No CVA tenderness.  Lymph: No cervical or inguinal lymphadenopathy. Skin: No rashes, bruises or suspicious lesions. Neurologic: Grossly intact, no focal deficits, moving all 4  extremities. Psychiatric: Normal mood and affect.  Laboratory Data: Lab Results  Component Value Date   WBC 5.5 09/17/2022   HGB 13.6 09/17/2022   HCT 39.7 09/17/2022   MCV 91 09/17/2022   PLT 228 09/17/2022    Lab Results  Component Value Date   CREATININE 1.10 (H) 09/17/2022    No results found for: "PSA"  No results found for: "TESTOSTERONE"  Lab Results  Component Value Date   HGBA1C 5.6 09/03/2016    Urinalysis    Component Value Date/Time   COLORURINE STRAW (A) 12/04/2018 1058   APPEARANCEUR Cloudy (A) 10/02/2022 1025   LABSPEC 1.003 (L) 12/04/2018 1058   PHURINE 7.0 12/04/2018 1058   GLUCOSEU Negative 10/02/2022 1025   HGBUR SMALL (A) 12/04/2018  [place] Negative 10/02/2022 1025   KETONESUR negative 01/22/2022 1316   KETONESUR NEGATIVE 12/04/2018 1058   PROTEINUR Negative 10/02/2022 1025   PROTEINUR NEGATIVE 12/04/2018 1058   UROBILINOGEN 0.2 01/22/2022 1316   UROBILINOGEN 0.2 07/14/2009 2018   NITRITE Positive (A) 10/02/2022 1025   NITRITE NEGATIVE 12/04/2018 1058   LEUKOCYTESUR 1+ (A) 10/02/2022 1025    Lab Results  Component Value Date   LABMICR See below: 10/02/2022   WBCUA 6-10 (A) 10/02/2022   LABEPIT 0-10 10/02/2022   BACTERIA Many (A) 10/02/2022    Pertinent Imaging:  Results for orders placed during the hospital encounter of 07/31/12  DG Abd 1 View  Narrative *RADIOLOGY REPORT*  Clinical Data: History of constipation.  Abdominal pain.  ABDOMEN - 1 VIEW  Comparison: 06/21/2011  Findings: Moderate stool burden within the colon, slightly decreased in the left colon since prior study.  No evidence of bowel obstruction.  No free air.  No organomegaly or acute bony abnormality.  Calcified phleboliths in the anatomic pelvis.  IMPRESSION: Moderate stool burden, decreased from prior study.  No acute findings.  Original Report Authenticated By: Raelyn Number, M.D.  No results found for this or any previous visit.  No  results found for this or any previous visit.  No results found for this or any previous visit.  No results found for this or any previous visit.  No valid procedures specified. No results found for this or any previous visit.  No results found for this or any previous visit.   Assessment & Plan:    1. Urinary tract infection without hematuria, site unspecified -urine for culture -macrobid '100mg'$  BID for 7 days -we will transition to macrodantin '50mg'$  qhs and stop hiprex - Urinalysis, Routine w reflex microscopic  2. OAB (overactive bladder) -we will trial gemtesa '75mg'$   - BLADDER SCAN AMB NON-IMAGING   No follow-ups on file.  Nicolette Bang, MD  [facility] [place]

## 2023-01-08 NOTE — Patient Instructions (Signed)
Urinary Tract Infection, Adult  A urinary tract infection (UTI) is an infection of any part of the urinary tract. The urinary tract includes the kidneys, ureters, bladder, and urethra. These organs make, store, and get rid of urine in the body. An upper UTI affects the ureters and kidneys. A lower UTI affects the bladder and urethra. What are the causes? Most urinary tract infections are caused by bacteria in your genital area around your urethra, where urine leaves your body. These bacteria grow and cause inflammation of your urinary tract. What increases the risk? You are more likely to develop this condition if: You have a urinary catheter that stays in place. You are not able to control when you urinate or have a bowel movement (incontinence). You are female and you: Use a spermicide or diaphragm for birth control. Have low estrogen levels. Are pregnant. You have certain genes that increase your risk. You are sexually active. You take antibiotic medicines. You have a condition that causes your flow of urine to slow down, such as: An enlarged prostate, if you are female. Blockage in your urethra. A kidney stone. A nerve condition that affects your bladder control (neurogenic bladder). Not getting enough to drink, or not urinating often. You have certain medical conditions, such as: Diabetes. A weak disease-fighting system (immunesystem). Sickle cell disease. Gout. Spinal cord injury. What are the signs or symptoms? Symptoms of this condition include: Needing to urinate right away (urgency). Frequent urination. This may include small amounts of urine each time you urinate. Pain or burning with urination. Blood in the urine. Urine that smells bad or unusual. Trouble urinating. Cloudy urine. Vaginal discharge, if you are female. Pain in the abdomen or the lower back. You may also have: Vomiting or a decreased appetite. Confusion. Irritability or tiredness. A fever or  chills. Diarrhea. The first symptom in older adults may be confusion. In some cases, they may not have any symptoms until the infection has worsened. How is this diagnosed? This condition is diagnosed based on your medical history and a physical exam. You may also have other tests, including: Urine tests. Blood tests. Tests for STIs (sexually transmitted infections). If you have had more than one UTI, a cystoscopy or imaging studies may be done to determine the cause of the infections. How is this treated? Treatment for this condition includes: Antibiotic medicine. Over-the-counter medicines to treat discomfort. Drinking enough water to stay hydrated. If you have frequent infections or have other conditions such as a kidney stone, you may need to see a health care provider who specializes in the urinary tract (urologist). In rare cases, urinary tract infections can cause sepsis. Sepsis is a life-threatening condition that occurs when the body responds to an infection. Sepsis is treated in the hospital with IV antibiotics, fluids, and other medicines. Follow these instructions at home:  Medicines Take over-the-counter and prescription medicines only as told by your health care provider. If you were prescribed an antibiotic medicine, take it as told by your health care provider. Do not stop using the antibiotic even if you start to feel better. General instructions Make sure you: Empty your bladder often and completely. Do not hold urine for long periods of time. Empty your bladder after sex. Wipe from front to back after urinating or having a bowel movement if you are female. Use each tissue only one time when you wipe. Drink enough fluid to keep your urine pale yellow. Keep all follow-up visits. This is important. Contact a health   care provider if: Your symptoms do not get better after 1-2 days. Your symptoms go away and then return. Get help right away if: You have severe pain in  your back or your lower abdomen. You have a fever or chills. You have nausea or vomiting. Summary A urinary tract infection (UTI) is an infection of any part of the urinary tract, which includes the kidneys, ureters, bladder, and urethra. Most urinary tract infections are caused by bacteria in your genital area. Treatment for this condition often includes antibiotic medicines. If you were prescribed an antibiotic medicine, take it as told by your health care provider. Do not stop using the antibiotic even if you start to feel better. Keep all follow-up visits. This is important. This information is not intended to replace advice given to you by your health care provider. Make sure you discuss any questions you have with your health care provider. Document Revised: 07/21/2020 Document Reviewed: 07/21/2020 Elsevier Patient Education  2023 Elsevier Inc.  

## 2023-01-10 LAB — URINE CULTURE

## 2023-01-14 ENCOUNTER — Encounter: Payer: Self-pay | Admitting: Urology

## 2023-01-21 DIAGNOSIS — I10 Essential (primary) hypertension: Secondary | ICD-10-CM | POA: Diagnosis not present

## 2023-01-21 DIAGNOSIS — E782 Mixed hyperlipidemia: Secondary | ICD-10-CM | POA: Diagnosis not present

## 2023-01-22 LAB — CMP14+EGFR
ALT: 17 IU/L (ref 0–32)
AST: 30 IU/L (ref 0–40)
Albumin/Globulin Ratio: 1.8 (ref 1.2–2.2)
Albumin: 4.2 g/dL (ref 3.7–4.7)
Alkaline Phosphatase: 146 IU/L — ABNORMAL HIGH (ref 44–121)
BUN/Creatinine Ratio: 15 (ref 12–28)
BUN: 13 mg/dL (ref 8–27)
Bilirubin Total: 0.3 mg/dL (ref 0.0–1.2)
CO2: 23 mmol/L (ref 20–29)
Calcium: 9.6 mg/dL (ref 8.7–10.3)
Chloride: 102 mmol/L (ref 96–106)
Creatinine, Ser: 0.84 mg/dL (ref 0.57–1.00)
Globulin, Total: 2.3 g/dL (ref 1.5–4.5)
Glucose: 95 mg/dL (ref 70–99)
Potassium: 3.9 mmol/L (ref 3.5–5.2)
Sodium: 141 mmol/L (ref 134–144)
Total Protein: 6.5 g/dL (ref 6.0–8.5)
eGFR: 68 mL/min/{1.73_m2} (ref >59)

## 2023-01-22 LAB — LIPID PANEL
Chol/HDL Ratio: 2.8 ratio (ref 0.0–4.4)
Cholesterol, Total: 288 mg/dL — ABNORMAL HIGH (ref 100–199)
HDL: 102 mg/dL (ref >39)
LDL Chol Calc (NIH): 168 mg/dL — ABNORMAL HIGH (ref 0–99)
Triglycerides: 106 mg/dL (ref 0–149)
VLDL Cholesterol Cal: 18 mg/dL (ref 5–40)

## 2023-01-23 ENCOUNTER — Ambulatory Visit (INDEPENDENT_AMBULATORY_CARE_PROVIDER_SITE_OTHER): Payer: Medicare PPO | Admitting: Family Medicine

## 2023-01-23 ENCOUNTER — Encounter: Payer: Self-pay | Admitting: Family Medicine

## 2023-01-23 VITALS — BP 132/80 | HR 72 | Ht 65.0 in | Wt 126.0 lb

## 2023-01-23 DIAGNOSIS — Z0000 Encounter for general adult medical examination without abnormal findings: Secondary | ICD-10-CM | POA: Diagnosis not present

## 2023-01-23 NOTE — Patient Instructions (Addendum)
F/U in 4 months, call if you need me sooner  Your daughter's presence at last visit was very beneficial  Glad that  you are doing much better  No med changes  NO dementia, keep spreading love and sharing yourself with others   Organize in the home, keys, $$ and the addresses and tele numbers  Pls bring your organized address book to next visit    One multivitamin daily will help with energy  Thanks for choosing [facility], we consider it a privelige to serve you.

## 2023-01-24 ENCOUNTER — Encounter: Payer: Self-pay | Admitting: Family Medicine

## 2023-01-24 NOTE — Progress Notes (Signed)
    Kelsey Nelson     MRN: [id number]      DOB: [birth year]  HPI: Patient is in for annual physical exam. No other health concerns are expressed or addressed at the visit. Recent labs,  are reviewed. Immunization is reviewed , and  updated if needed.   PE: BP 132/80 (BP Location: Left Arm, Patient Position: Sitting, Cuff Size: Normal)   Pulse 72   Ht '5\' 5"'$  (1.651 m)   Wt 126 lb 0.6 oz (57.2 kg)   SpO2 95%   BMI 20.97 kg/m   Pleasant  female, alert and oriented x 3, in no cardio-pulmonary distress. Afebrile. HEENT No facial trauma or asymetry. Sinuses non tender.  Extra occullar muscles intact.. External ears normal, . Neck: decreased ROM, no adenopathy,JVD or thyromegaly.No bruits.  Chest: Clear to ascultation bilaterally.No crackles or wheezes. Non tender to palpation   Cardiovascular system; Heart sounds normal,  S1 and  S2 ,no S3.  No murmur, or thrill. Apical beat not displaced Peripheral pulses normal.  Abdomen: Soft, non tender     Musculoskeletal exam: Decreased  ROM of spine, hips , shoulders and knees. No deformity ,swelling or crepitus noted. No muscle wasting or atrophy.   Neurologic: Cranial nerves 2 to 12 intact. Power, tone ,sensation  normal throughout. No disturbance in gait. No tremor.  Skin: Intact, no ulceration, erythema , scaling or rash noted. Pigmentation normal throughout  Psych; Normal mood and affect. Judgement and concentration normal   Assessment & Plan:  Annual physical exam Annual exam as documented. Counseling done  re healthy lifestyle involving commitment to 150 minutes exercise per week, heart healthy diet, and attaining healthy weight.The importance of adequate sleep also discussed. Regular seat belt use and home safety, is also discussed. Changes in health habits are decided on by the patient with goals and time frames  set for achieving them. Immunization and cancer screening needs are specifically addressed at this  visit.

## 2023-01-24 NOTE — Assessment & Plan Note (Signed)

## 2023-01-27 ENCOUNTER — Ambulatory Visit (INDEPENDENT_AMBULATORY_CARE_PROVIDER_SITE_OTHER): Payer: Medicare PPO

## 2023-01-27 DIAGNOSIS — L500 Allergic urticaria: Secondary | ICD-10-CM

## 2023-01-27 DIAGNOSIS — L508 Other urticaria: Secondary | ICD-10-CM

## 2023-01-31 ENCOUNTER — Other Ambulatory Visit: Payer: Self-pay | Admitting: Family Medicine

## 2023-02-12 DIAGNOSIS — H40052 Ocular hypertension, left eye: Secondary | ICD-10-CM | POA: Diagnosis not present

## 2023-02-12 DIAGNOSIS — H0102A Squamous blepharitis right eye, upper and lower eyelids: Secondary | ICD-10-CM | POA: Diagnosis not present

## 2023-02-12 DIAGNOSIS — Z961 Presence of intraocular lens: Secondary | ICD-10-CM | POA: Diagnosis not present

## 2023-02-12 DIAGNOSIS — H0102B Squamous blepharitis left eye, upper and lower eyelids: Secondary | ICD-10-CM | POA: Diagnosis not present

## 2023-02-24 ENCOUNTER — Ambulatory Visit: Payer: Medicare PPO

## 2023-02-25 DIAGNOSIS — Z4421 Encounter for fitting and adjustment of artificial right eye: Secondary | ICD-10-CM | POA: Diagnosis not present

## 2023-02-26 ENCOUNTER — Ambulatory Visit (INDEPENDENT_AMBULATORY_CARE_PROVIDER_SITE_OTHER): Payer: Medicare PPO

## 2023-02-26 DIAGNOSIS — L508 Other urticaria: Secondary | ICD-10-CM

## 2023-02-26 DIAGNOSIS — L501 Idiopathic urticaria: Secondary | ICD-10-CM

## 2023-03-05 ENCOUNTER — Encounter: Payer: Self-pay | Admitting: Family Medicine

## 2023-03-05 ENCOUNTER — Ambulatory Visit (INDEPENDENT_AMBULATORY_CARE_PROVIDER_SITE_OTHER): Payer: Medicare PPO | Admitting: Family Medicine

## 2023-03-05 VITALS — BP 120/77 | HR 82 | Ht 65.0 in | Wt 128.0 lb

## 2023-03-05 DIAGNOSIS — N390 Urinary tract infection, site not specified: Secondary | ICD-10-CM

## 2023-03-05 DIAGNOSIS — F5104 Psychophysiologic insomnia: Secondary | ICD-10-CM | POA: Diagnosis not present

## 2023-03-05 DIAGNOSIS — F321 Major depressive disorder, single episode, moderate: Secondary | ICD-10-CM

## 2023-03-05 DIAGNOSIS — N3281 Overactive bladder: Secondary | ICD-10-CM | POA: Diagnosis not present

## 2023-03-05 DIAGNOSIS — I10 Essential (primary) hypertension: Secondary | ICD-10-CM

## 2023-03-05 DIAGNOSIS — G3184 Mild cognitive impairment, so stated: Secondary | ICD-10-CM | POA: Diagnosis not present

## 2023-03-05 MED ORDER — DONEPEZIL HCL 5 MG PO TABS: 5.0000 mg | ORAL_TABLET | Freq: Every day | ORAL | 5 refills | Status: DC

## 2023-03-05 NOTE — Patient Instructions (Addendum)
F/Un in 7 weeks re evaluate memory, new medication is aricept 5 mg  daily every evening  Please continue to care for other people  Please bring all meds you are taking to next visit   Please put up signs in your home  Please  start planning routes before driving  Thanks for choosing [facility], we consider it a privelige to serve you.

## 2023-03-10 ENCOUNTER — Encounter: Payer: Self-pay | Admitting: Family Medicine

## 2023-03-10 DIAGNOSIS — G3184 Mild cognitive impairment, so stated: Secondary | ICD-10-CM | POA: Insufficient documentation

## 2023-03-10 MED ORDER — RAMIPRIL 10 MG PO CAPS: ORAL_CAPSULE | ORAL | 3 refills | Status: DC

## 2023-03-10 NOTE — Assessment & Plan Note (Signed)
Controlled, no change in medication DASH diet and commitment to daily physical activity for a minimum of 30 minutes discussed and encouraged, as a part of hypertension management. The importance of attaining a healthy weight is also discussed.     03/05/2023    1:04 PM 01/23/2023   10:11 AM 01/23/2023   10:08 AM 01/08/2023    9:48 AM 01/01/2023    9:21 AM 12/13/2022    9:12 AM 12/13/2022    9:10 AM  BP/Weight  Systolic BP 123456 Q000111Q 123456 [id number] [id number]  Diastolic BP 77 80 77 88 76 82 84  Wt. (Lbs) 128  126.04  125  124  BMI 21.3 kg/m2  20.97 kg/m2  20.8 kg/m2  20.63 kg/m2

## 2023-03-10 NOTE — Assessment & Plan Note (Signed)
Denies depression and has stopped zoloft

## 2023-03-10 NOTE — Assessment & Plan Note (Signed)
Unchanged, no response to prescription meds tried, uses OTC benadryl at times

## 2023-03-10 NOTE — Assessment & Plan Note (Signed)
Stabilized on chronic antibiotic per urology

## 2023-03-10 NOTE — Assessment & Plan Note (Signed)
Remains concerned about episodes of forgetfulness trial of aricept 5 mg daily , re eval in 8 to 10 weeks

## 2023-03-10 NOTE — Progress Notes (Signed)
   HERMENIA Nelson     MRN: [id number]      DOB: 1934/01/05   HPI [name] is here for follow up and re-evaluation of chronic medical conditions, medication management and review of any available recent lab and radiology data.  Preventive health is updated, specifically  Cancer screening and Immunization.   Questions or concerns regarding consultations or procedures which the PT has had in the interim are  addressed. The PT denies any adverse reactions to current medications since the last visit. Has stopped zolofty, does not feel she is benefititng or needs this Ongoing concern about forgetfullness, interested in medication, has started behav modification There are no new concerns.  There are no specific complaints   ROS Denies recent fever or chills. Denies sinus pressure, nasal congestion, ear pain or sore throat. Denies chest congestion, productive cough or wheezing. Denies chest pains, palpitations and leg swelling Denies abdominal pain, nausea, vomiting,diarrhea or constipation.   Denies dysuria, frequency, hesitancy or incontinence. Denies joint pain, swelling and limitation in mobility. Denies headaches, seizures, numbness, or tingling. Denies depression, anxiety or insomnia. Denies skin break down or rash.   PE  BP 120/77 (BP Location: Right Arm, Patient Position: Sitting, Cuff Size: Normal)   Pulse 82   Ht 5\' 5"  (1.651 m)   Wt 128 lb (58.1 kg)   SpO2 94%   BMI 21.30 kg/m   Patient alert and oriented and in no cardiopulmonary distress.  HEENT: No facial asymmetry, EOMI,     Neck supple .  Chest: Clear to auscultation bilaterally.  CVS: S1, S2 no murmurs, no S3.Regular rate.  ABD: Soft non tender.   Ext: No edema  MS: Adequate ROM spine, shoulders, hips and knees.  Skin: Intact, no ulcerations or rash noted.  Psych: Good eye contact, normal affect. Memory intact not anxious or depressed appearing.  CNS: CN 2-12 intact, power,  normal throughout.no focal  deficits noted.   Assessment & Plan  HTN (hypertension) Controlled, no change in medication DASH diet and commitment to daily physical activity for a minimum of 30 minutes discussed and encouraged, as a part of hypertension management. The importance of attaining a healthy weight is also discussed.     03/05/2023    1:04 PM 01/23/2023   10:11 AM 01/23/2023   10:08 AM 01/08/2023    9:48 AM 01/01/2023    9:21 AM 12/13/2022    9:12 AM 12/13/2022    9:10 AM  BP/Weight  Systolic BP 123456 Q000111Q 123456 [id number] [id number] 123456  Diastolic BP 77 80 77 88 76 82 84  Wt. (Lbs) 128  126.04  125  124  BMI 21.3 kg/m2  20.97 kg/m2  20.8 kg/m2  20.63 kg/m2       Recurrent UTI Stabilized on chronic antibiotic per urology  Insomnia Unchanged, no response to prescription meds tried, uses OTC benadryl at times  Moderate major depression, single episode ([place]) Denies depression and has stopped zoloft  MCI (mild cognitive impairment) Remains concerned about episodes of forgetfulness trial of aricept 5 mg daily , re eval in 8 to 10 weeks

## 2023-03-24 ENCOUNTER — Ambulatory Visit (INDEPENDENT_AMBULATORY_CARE_PROVIDER_SITE_OTHER): Payer: PPO

## 2023-03-24 DIAGNOSIS — L508 Other urticaria: Secondary | ICD-10-CM

## 2023-04-02 ENCOUNTER — Telehealth: Payer: Self-pay

## 2023-04-02 NOTE — Telephone Encounter (Signed)
Kelsey Nelson came by today with a new insurance document. She now has [employer]. I have scanned in this information.

## 2023-04-09 ENCOUNTER — Ambulatory Visit (INDEPENDENT_AMBULATORY_CARE_PROVIDER_SITE_OTHER): Payer: PPO | Admitting: Urology

## 2023-04-09 ENCOUNTER — Encounter: Payer: Self-pay | Admitting: Urology

## 2023-04-09 VITALS — BP 146/73 | HR 76

## 2023-04-09 DIAGNOSIS — K5909 Other constipation: Secondary | ICD-10-CM | POA: Diagnosis not present

## 2023-04-09 DIAGNOSIS — N390 Urinary tract infection, site not specified: Secondary | ICD-10-CM | POA: Diagnosis not present

## 2023-04-09 DIAGNOSIS — N3281 Overactive bladder: Secondary | ICD-10-CM | POA: Diagnosis not present

## 2023-04-09 MED ORDER — ESTROGENS CONJUGATED 0.625 MG/GM VA CREA: 1.0000 | TOPICAL_CREAM | VAGINAL | 5 refills | Status: DC

## 2023-04-09 MED ORDER — METHENAMINE HIPPURATE 1 G PO TABS: 1.0000 g | ORAL_TABLET | Freq: Every day | ORAL | 3 refills | Status: DC

## 2023-04-09 NOTE — Progress Notes (Signed)
04/09/2023 10:32 AM   Kelsey Nelson 1934-08-25 [id number]  Referring provider: Kerri Perches, MD [address] [place],  [place] [address]  Followup recurrent UTI and OAB   HPI: [name] is a 89yo here for followup for recurrent UTI and OAB. She tried gemtesa last which failed to improve her urgency and incontinence. She has not had a UTI since last visit. She is on hiprex 1g daily. She uses premarin 2-3x per week. The dysuria. She has issues with intermittent constipation. She takes prunes.    PMH: Past Medical History:  Diagnosis Date   Acute cystitis with hematuria 10/26/2013   Allergic rhinitis 11/08/2017   Blindness of right eye    BMI between 19-24,adult 2010 145 LBS   Diverticula of small intestine Dec 2010   Diverticulosis    [place]   DIVERTICULOSIS, SIGMOID COLON 03/31/2008   Qualifier: Diagnosis of  By: Lind Guest  Noted on abdominal and pelvic scan in 04/2011.  Extensive colonic diverticulosis , and possible muscular hypertrophy at rectosigmoid junction    Family hx of colon cancer 11/09/2013   Functional constipation 2010   GERD (gastroesophageal reflux disease)    HTN (hypertension)    Hyperlipemia    Pancreatic cyst 02/05/2013   Renal cyst 05/27/2011   Right eye trauma 1949   Loss of vision resulted at age 22   Schatzki's ring Dec 2010   Skin lesions, generalized 06/19/2015   Tubular adenoma 11/09/2013    Surgical History: Past Surgical History:  Procedure Laterality Date   ABDOMINAL HYSTERECTOMY     BREAST REDUCTION SURGERY  1992 BIL   COLONOSCOPY  DEC 2010   SIMPLE ADENOMAS (6-8), [place] TICS, SML IH   COLONOSCOPY N/A 12/08/2013   Procedure: COLONOSCOPY;  Surgeon: Malissa Hippo, MD;  [facility];  Service: Endoscopy;  Laterality: N/A;  225   EVISCERATION Right 05/08/2015   Procedure: EVISCERATION WITH IMPLANT RIGHT EYE;  Surgeon: Susa Simmonds, MD;  Location: [facility];  Service: Ophthalmology;  Laterality: Right;   EYE SURGERY   RIGHT 2005   EYE SURGERY  right 2016   removed and false eye placed in 08/2015   TOTAL ABDOMINAL HYSTERECTOMY W/ BILATERAL SALPINGOOPHORECTOMY  1985 FIBROIDS   UPPER GASTROINTESTINAL ENDOSCOPY  DEC 2010   Bx-MILD GASTRITIS/DUODENITIS, DUODENAL DIVERTICULA, SCHATZKI'S RING    Home Medications:  Allergies as of 04/09/2023       Reactions   Codeine    Crestor [rosuvastatin] Other (See Comments)   Can't recall   Fosamax [alendronate Sodium]    heartburn        Medication List        Accurate as of April 09, 2023 10:32 AM. If you have any questions, ask your nurse or doctor.          ascorbic acid 500 MG tablet Commonly known as: VITAMIN C Take 500 mg by mouth daily.   conjugated estrogens 0.625 MG/GM vaginal cream Commonly known as: PREMARIN Apply one fingertip to introitus every 2 days.   diphenhydrAMINE 25 mg capsule Commonly known as: Benadryl Allergy Take 1 capsule (25 mg total) by mouth every 6 (six) hours as needed. What changed: additional instructions   donepezil 5 MG tablet Commonly known as: ARICEPT Take 1 tablet (5 mg total) by mouth at bedtime.   EPINEPHrine 0.3 mg/0.3 mL Soaj injection Commonly known as: EpiPen 2-Pak Inject 0.3 mg into the muscle as needed for anaphylaxis.   famotidine 20 MG  tablet Commonly known as: PEPCID Take 1 tablet (20 mg total) by mouth 2 (two) times daily.   fexofenadine 180 MG tablet Commonly known as: Allegra Allergy Take 1 tablet (180 mg total) by mouth every morning.   Gemtesa 75 MG Tabs Generic drug: Vibegron Take 1 capsule by mouth daily.   methenamine 1 g tablet Commonly known as: Hiprex Take 1 tablet (1 g total) by mouth at bedtime.   nitrofurantoin (macrocrystal-monohydrate) 100 MG capsule Commonly known as: MACROBID Take 1 capsule (100 mg total) by mouth every 12 (twelve) hours.   nitrofurantoin 50 MG capsule Commonly known as: Macrodantin Take 1 capsule (50 mg total) by mouth at bedtime.   OVER THE  COUNTER MEDICATION Multi Collagen Protein - Patient states that she takes 1 scoop a day.   polyethylene glycol powder 17 GM/SCOOP powder Commonly known as: GLYCOLAX/MIRALAX Take 17 g by mouth daily as needed.   PROBIOTIC DAILY PO Take 1 tablet by mouth daily.   ramipril 10 MG capsule Commonly known as: ALTACE Take one capsule by mouth once daily for blood pressure   TUMS PO Take 1 tablet by mouth 3 (three) times daily as needed.   VITAMIN D3 PO Take 25 mcg by mouth daily.   Xolair 150 MG/ML prefilled syringe Generic drug: omalizumab Inject into the skin.        Allergies:  Allergies  Allergen Reactions   Codeine    Crestor [Rosuvastatin] Other (See Comments)    Can't recall   Fosamax [Alendronate Sodium]     heartburn    Family History: Family History  Problem Relation Age of Onset   Pancreatic cancer Sister    Colon cancer Paternal Aunt    Colon polyps Neg Hx     Social History:  reports that she has never smoked. She has never used smokeless tobacco. She reports that she does not drink alcohol and does not use drugs.  ROS: All other review of systems were reviewed and are negative except what is noted above in HPI  Physical Exam: BP (!) 146/73   Pulse 76   Constitutional:  Alert and oriented, No acute distress. HEENT: [place] AT, moist mucus membranes.  Trachea midline, no masses. Cardiovascular: No clubbing, cyanosis, or edema. Respiratory: Normal respiratory effort, no increased work of breathing. GI: Abdomen is soft, nontender, nondistended, no abdominal masses GU: No CVA tenderness.  Lymph: No cervical or inguinal lymphadenopathy. Skin: No rashes, bruises or suspicious lesions. Neurologic: Grossly intact, no focal deficits, moving all 4 extremities. Psychiatric: Normal mood and affect.  Laboratory Data: Lab Results  Component Value Date   WBC 5.5 09/17/2022   HGB 13.6 09/17/2022   HCT 39.7 09/17/2022   MCV 91 09/17/2022   PLT 228 09/17/2022     Lab Results  Component Value Date   CREATININE 0.84 01/21/2023    No results found for: "PSA"  No results found for: "TESTOSTERONE"  Lab Results  Component Value Date   HGBA1C 5.6 09/03/2016    Urinalysis    Component Value Date/Time   COLORURINE STRAW (A) 12/04/2018 1058   APPEARANCEUR Cloudy (A) 01/08/2023 0940   LABSPEC 1.003 (L) 12/04/2018 1058   PHURINE 7.0 12/04/2018 1058   GLUCOSEU Negative 01/08/2023 0940   HGBUR SMALL (A) 12/04/2018 1058   BILIRUBINUR Negative 01/08/2023 0940   KETONESUR negative 01/22/2022 1316   KETONESUR NEGATIVE 12/04/2018 1058   PROTEINUR Negative 01/08/2023 0940   PROTEINUR NEGATIVE 12/04/2018 1058   UROBILINOGEN 0.2 01/22/2022 1316  UROBILINOGEN 0.2 07/14/2009 2018   NITRITE Positive (A) 01/08/2023 0940   NITRITE NEGATIVE 12/04/2018 1058   LEUKOCYTESUR 2+ (A) 01/08/2023 0940    Lab Results  Component Value Date   LABMICR See below: 01/08/2023   WBCUA 6-10 (A) 01/08/2023   LABEPIT 0-10 01/08/2023   BACTERIA Many (A) 01/08/2023    Pertinent Imaging:  Results for orders placed during the hospital encounter of 07/31/12  DG Abd 1 View  Narrative *RADIOLOGY REPORT*  Clinical Data: History of constipation.  Abdominal pain.  ABDOMEN - 1 VIEW  Comparison: 06/21/2011  Findings: Moderate stool burden within the colon, slightly decreased in the left colon since prior study.  No evidence of bowel obstruction.  No free air.  No organomegaly or acute bony abnormality.  Calcified phleboliths in the anatomic pelvis.  IMPRESSION: Moderate stool burden, decreased from prior study.  No acute findings.  Original Report Authenticated By: Cyndie Chime, M.D.  No results found for this or any previous visit.  No results found for this or any previous visit.  No results found for this or any previous visit.  No results found for this or any previous visit.  No valid procedures specified. No results found for this or any  previous visit.  No results found for this or any previous visit.   Assessment & Plan:    1. Urinary tract infection without hematuria, site unspecified -continue hiprex 1g daily - Urinalysis, Routine w reflex microscopic  2. OAB (overactive bladder) -continue premarin 3x per week   No follow-ups on file.  Wilkie Aye, MD  [facility] [place]

## 2023-04-09 NOTE — Patient Instructions (Signed)

## 2023-04-15 ENCOUNTER — Encounter (INDEPENDENT_AMBULATORY_CARE_PROVIDER_SITE_OTHER): Payer: Self-pay | Admitting: *Deleted

## 2023-04-21 ENCOUNTER — Ambulatory Visit (INDEPENDENT_AMBULATORY_CARE_PROVIDER_SITE_OTHER): Payer: PPO | Admitting: Internal Medicine

## 2023-04-21 ENCOUNTER — Ambulatory Visit: Payer: Medicare PPO

## 2023-04-21 ENCOUNTER — Encounter: Payer: Self-pay | Admitting: Internal Medicine

## 2023-04-21 ENCOUNTER — Other Ambulatory Visit: Payer: Self-pay

## 2023-04-21 VITALS — BP 118/62 | HR 86 | Temp 97.7°F | Resp 16 | Ht 65.0 in | Wt 127.5 lb

## 2023-04-21 DIAGNOSIS — L501 Idiopathic urticaria: Secondary | ICD-10-CM

## 2023-04-21 DIAGNOSIS — J3089 Other allergic rhinitis: Secondary | ICD-10-CM

## 2023-04-21 DIAGNOSIS — L258 Unspecified contact dermatitis due to other agents: Secondary | ICD-10-CM

## 2023-04-21 MED ORDER — FEXOFENADINE HCL 180 MG PO TABS: 180.0000 mg | ORAL_TABLET | Freq: Every morning | ORAL | 5 refills | Status: DC

## 2023-04-21 MED ORDER — FLUTICASONE PROPIONATE 50 MCG/ACT NA SUSP: 1.0000 | Freq: Every day | NASAL | 5 refills | Status: AC

## 2023-04-21 NOTE — Patient Instructions (Addendum)
Chronic urticaria - Keep track of how often you are having hives. - Continue Xolair 300mg  every 4 weeks.  - Continue Allegra 180mg  daily.  Can increase to twice daily if hives increased.  Allergic Rhinitis:   - Positive blood test 08/2022: cockroach - Avoidance measures discussed. - Use nasal saline rinses before nose sprays such as with Neilmed Sinus Rinse.  Use distilled water.   - Use Flonase 1 sprays each nostril daily. Aim upward and outward. - Use Allegra 180mg  daily.   2. Return in about 6 months (around 10/21/2023).

## 2023-04-21 NOTE — Progress Notes (Signed)
FOLLOW UP Date of Service/Encounter:  04/21/23   Subjective:  Kelsey Nelson (DOB: 12/21/1934) is a 89 y.o. female who [facility] for follow up for chronic idiopathic urticaria and allergic rhinitis   History obtained from: chart review and patient. Last visit was with Dr. Dellis Anes on 11/29/2022 and on Xolair.  She is not the best historian.  Hives: Reports doing well overall. Not much hives.  Has had a rash on her ankle for couple of weeks, tried moisturizing.  It is itchy and red.  Denies any insect bites.  Informed her to try a topical steroid, reports she has this at home and will try it so doesn't want anything prescribed right now.  Taking anti histamine, Allegra? Not sure about the name of her medications.  She has also been having trouble with her memory and is seeing her PCP for it, started on Aricept with follow up coming up in a few days.  Also recently lost her pocketbook.  Lives with her husband.   Rhinitis: Some congestion and drainage with start of pollen season. She is not using any nose sprays.  Past Medical History: Past Medical History:  Diagnosis Date   Acute cystitis with hematuria 10/26/2013   Allergic rhinitis 11/08/2017   Blindness of right eye    BMI between 19-24,adult 2010 145 LBS   Diverticula of small intestine Dec 2010   Diverticulosis    [place]   DIVERTICULOSIS, SIGMOID COLON 03/31/2008   Qualifier: Diagnosis of  By: Lind Guest  Noted on abdominal and pelvic scan in 04/2011.  Extensive colonic diverticulosis , and possible muscular hypertrophy at rectosigmoid junction    Family hx of colon cancer 11/09/2013   Functional constipation 2010   GERD (gastroesophageal reflux disease)    HTN (hypertension)    Hyperlipemia    Pancreatic cyst 02/05/2013   Renal cyst 05/27/2011   Right eye trauma 1949   Loss of vision resulted at age 58   Schatzki's ring Dec 2010   Skin lesions, generalized 06/19/2015   Tubular  adenoma 11/09/2013    Objective:  BP 118/62   Pulse 86   Temp 97.7 F (36.5 C)   Resp 16   Ht 5\' 5"  (1.651 m)   Wt 127 lb 8 oz (57.8 kg)   SpO2 96%   BMI 21.22 kg/m  Body mass index is 21.22 kg/m. Physical Exam: GEN: alert, well developed HEENT: clear conjunctiva,nose with mild inferior turbinate hypertrophy, pink nasal mucosa, clear rhinorrhea, no cobblestoning HEART: regular rate and rhythm, no murmur LUNGS: clear to auscultation bilaterally, no coughing, unlabored respiration SKIN: erythematous rash on R ankle  Assessment:   1. Chronic idiopathic urticaria   2. Perennial allergic rhinitis   3. Contact dermatitis due to other agent, unspecified contact dermatitis type     Plan/Recommendations:  Chronic urticaria - Keep track of how often you are having hives. - Continue Xolair 300mg  every 4 weeks. Keep Epipen for shots.  - Continue Allegra 180mg  daily.  Can increase to twice daily if hives increased.  Allergic Rhinitis:   - Positive blood test 08/2022: cockroach - Avoidance measures discussed. - Use nasal saline rinses before nose sprays such as with Neilmed Sinus Rinse.  Use distilled water.   - Use Flonase 1 sprays each nostril daily. Aim upward and outward. - Use Allegra 180mg  daily.   Rash on Ankle - This is not hives, some form of dermatitis.  Discussed good skin care.  Offered to send a topical steroid to try but she reports having it at home and does not want [place] to send anything. Can also consider treating for fungal infection, if persistent.   Mild Cognitive Impairment - Some concern for memory loss.  She is being followed by PCP for this.  Today, she also seemed confused regarding which door to enter through for our office and reports losing her pocket book. Also was confused about her appointment time and Xolair shot time.   Return in about 6 months (around 10/21/2023).  Alesia Morin, MD [facility]

## 2023-04-22 ENCOUNTER — Ambulatory Visit (HOSPITAL_COMMUNITY)
Admission: RE | Admit: 2023-04-22 | Discharge: 2023-04-22 | Disposition: A | Discharge status: Home / Self Care | Payer: PPO | Source: Ambulatory Visit | Attending: Gastroenterology | Admitting: Gastroenterology

## 2023-04-22 ENCOUNTER — Ambulatory Visit (INDEPENDENT_AMBULATORY_CARE_PROVIDER_SITE_OTHER): Payer: PPO | Admitting: Gastroenterology

## 2023-04-22 ENCOUNTER — Encounter (INDEPENDENT_AMBULATORY_CARE_PROVIDER_SITE_OTHER): Payer: Self-pay | Admitting: Gastroenterology

## 2023-04-22 VITALS — BP 126/79 | HR 75 | Temp 99.1°F | Ht 65.0 in | Wt 127.9 lb

## 2023-04-22 DIAGNOSIS — K5904 Chronic idiopathic constipation: Secondary | ICD-10-CM | POA: Diagnosis not present

## 2023-04-22 DIAGNOSIS — K5900 Constipation, unspecified: Secondary | ICD-10-CM | POA: Diagnosis not present

## 2023-04-22 NOTE — Patient Instructions (Signed)
Please go over to [facility] hospital to have xray of your belly If this shows a lot of stool (constipation) I will send a bowel prep to get you cleaned out We will need to start you on a good bowel regimen thereafter to avoid further episodes of constipation  Increase water intake, aim for atleast 64 oz per day Increase fruits, veggies and whole grains, kiwi and prunes are especially good for constipation  Follow up 3 months

## 2023-04-22 NOTE — Progress Notes (Addendum)
Referring Provider: Malen Gauze, MD Primary Care Physician:  Kerri Perches, MD Primary GI Physician: Levon Hedger   Chief Complaint  Patient presents with   Constipation    Follow up on constipation. Has tried miralax, stool softners and bisacodyl 5mg  laxative. Stool is all liquid.    HPI:   Kelsey Nelson is a 89 y.o. female with past medical history of  GERD, hypertension, hyperlipidemia, who presents for follow up of constipation and nausea   Patient presenting today for constipation.  Last seen November 2023, at that time  constipation better,  having a BM everyday as long a she eats prunes daily. No abdominal pain, rectal bleeding or melena. taking pepcid 20mg  for her GERD with some breakthrough heartburn requiring her to use tums 2-3x/week.  Symptoms typically occur more in the evenings but are not worse when she is lying down.    Recommended to continue with current constipation regimen, good water intake, increase pepcid to 40mg  QHS, reflux precautions.  Present:  she notes that she went about 4-5 days without a BM and then started a laxative, now having liquid stools, cannot even pass this if she does not take the laxative. She has some lower abdominal pain but notes she has a lot of bladder issues so she is unsure if pain is from her bladder or bowels. She denies nausea or vomiting. She was taking stool softeners prior to the constipation and having 1 BM per day that was soft but formed. She notes feeling like she has to have a BM but cannot. Appetite is good. Denies rectal bleeding or melena. Denies any recent changes in medications. She notes feeling that she needs to defecate but feels she cannot pass the stool.   Last Colonoscopy:2014 Examination performed to cecum. Small polyps ablated via cold biopsy from proximal transverse colon. Multiple diverticula at sigmoid colon. Small external hemorrhoids. Last Endoscopy: never   Past Medical History:  Diagnosis  Date   Acute cystitis with hematuria 10/26/2013   Allergic rhinitis 11/08/2017   Blindness of right eye    BMI between 19-24,adult 2010 145 LBS   Diverticula of small intestine Dec 2010   Diverticulosis    [place]   DIVERTICULOSIS, SIGMOID COLON 03/31/2008   Qualifier: Diagnosis of  By: Lind Guest  Noted on abdominal and pelvic scan in 04/2011.  Extensive colonic diverticulosis , and possible muscular hypertrophy at rectosigmoid junction    Family hx of colon cancer 11/09/2013   Functional constipation 2010   GERD (gastroesophageal reflux disease)    HTN (hypertension)    Hyperlipemia    Pancreatic cyst 02/05/2013   Renal cyst 05/27/2011   Right eye trauma 1949   Loss of vision resulted at age 19   Schatzki's ring Dec 2010   Skin lesions, generalized 06/19/2015   Tubular adenoma 11/09/2013    Past Surgical History:  Procedure Laterality Date   ABDOMINAL HYSTERECTOMY     BREAST REDUCTION SURGERY  1992 BIL   COLONOSCOPY  DEC 2010   SIMPLE ADENOMAS (6-8), [place] TICS, SML IH   COLONOSCOPY N/A 12/08/2013   Procedure: COLONOSCOPY;  Surgeon: Malissa Hippo, MD;  Location: [facility];  Service: Endoscopy;  Laterality: N/A;  225   EVISCERATION Right 05/08/2015   Procedure: EVISCERATION WITH IMPLANT RIGHT EYE;  Surgeon: Susa Simmonds, MD;  Location: [facility] ORS;  Service: Ophthalmology;  Laterality: Right;   EYE SURGERY  RIGHT 2005   EYE SURGERY  right 2016   removed  and false eye placed in 08/2015   TOTAL ABDOMINAL HYSTERECTOMY W/ BILATERAL SALPINGOOPHORECTOMY  1985 FIBROIDS   UPPER GASTROINTESTINAL ENDOSCOPY  DEC 2010   Bx-MILD GASTRITIS/DUODENITIS, DUODENAL DIVERTICULA, SCHATZKI'S RING    Current Outpatient Medications  Medication Sig Dispense Refill   Calcium Carbonate Antacid (TUMS PO) Take 1 tablet by mouth 3 (three) times daily as needed.     Cholecalciferol (VITAMIN D3 PO) Take 25 mcg by mouth daily.     conjugated estrogens (PREMARIN) vaginal cream Apply one fingertip to introitus  every 2 days. 42.5 g 5   conjugated estrogens (PREMARIN) vaginal cream Place 1 Applicatorful vaginally every other day. 42.5 g 5   diphenhydrAMINE (BENADRYL ALLERGY) 25 mg capsule Take 1 capsule (25 mg total) by mouth every 6 (six) hours as needed. 30 capsule 0   donepezil (ARICEPT) 5 MG tablet Take 1 tablet (5 mg total) by mouth at bedtime. 30 tablet 5   EPINEPHrine (EPIPEN 2-PAK) 0.3 mg/0.3 mL IJ SOAJ injection Inject 0.3 mg into the muscle as needed for anaphylaxis. 2 each 1   fexofenadine (ALLEGRA ALLERGY) 180 MG tablet Take 1 tablet (180 mg total) by mouth every morning. 30 tablet 5   fluticasone (FLONASE) 50 MCG/ACT nasal spray Place 1 spray into both nostrils daily. 16 g 5   methenamine (HIPREX) 1 g tablet Take 1 tablet (1 g total) by mouth at bedtime. 90 tablet 3   Multiple Vitamin (MULTIVITAMIN PO) Take by mouth.     OVER THE COUNTER MEDICATION Multi Collagen Protein - Patient states that she takes 1 scoop a day.     OVER THE COUNTER MEDICATION One daily hylands leg cramp relief     OVER THE COUNTER MEDICATION Omega xl joint and muscle support daily     polyethylene glycol powder (GLYCOLAX/MIRALAX) 17 GM/SCOOP powder Take 17 g by mouth daily as needed. 3350 g 1   Probiotic Product (PROBIOTIC DAILY PO) Take 1 tablet by mouth daily.     ramipril (ALTACE) 10 MG capsule Take one capsule by mouth once daily for blood pressure 90 capsule 3   XOLAIR 150 MG/ML prefilled syringe Inject into the skin.     famotidine (PEPCID) 20 MG tablet Take 1 tablet (20 mg total) by mouth 2 (two) times daily. 60 tablet 3   Current Facility-Administered Medications  Medication Dose Route Frequency Provider Last Rate Last Admin   omalizumab Geoffry Paradise) injection 150 mg  150 mg Subcutaneous Q28 days Alfonse Spruce, MD   150 mg at 04/21/23 1115    Allergies as of 04/22/2023 - Review Complete 04/22/2023  Allergen Reaction Noted   Codeine  07/22/2015   Crestor [rosuvastatin] Other (See Comments) 10/26/2021    Fosamax [alendronate sodium]  12/31/2013    Family History  Problem Relation Age of Onset   Pancreatic cancer Sister    Colon cancer Paternal Aunt    Colon polyps Neg Hx     Social History   Socioeconomic History   Marital status: Married    Spouse name: Onalee Hua    Number of children: 3   Years of education: 12   Highest education level: 12th grade  Occupational History   Occupation: retired   Tobacco Use   Smoking status: Never    Passive exposure: Past   Smokeless tobacco: Never  Vaping Use   Vaping Use: Never used  Substance and Sexual Activity   Alcohol use: No    Alcohol/week: 0.0 standard drinks of alcohol   Drug use: No  Sexual activity: Not Currently  Other Topics Concern   Not on file  Social History Narrative   MARRIED TO DAVID Schabel. 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/   Social Determinants of Health   Financial Resource Strain: Low Risk  (01/22/2022)   Overall Financial Resource Strain (CARDIA)    Difficulty of Paying Living Expenses: Not hard at all  Food Insecurity: No Food Insecurity (01/22/2022)   Hunger Vital Sign    Worried About Running Out of Food in the Last Year: Never true    Ran Out of Food in the Last Year: Never true  Transportation Needs: No Transportation Needs (01/22/2022)   PRAPARE - Administrator, Civil Service (Medical): No    Lack of Transportation (Non-Medical): No  Physical Activity: Sufficiently Active (01/22/2022)   Exercise Vital Sign    Days of Exercise per Week: 5 days    Minutes of Exercise per Session: 60 min  Stress: No Stress Concern Present (01/22/2022)   [employer] of Occupational Health - Occupational Stress Questionnaire    Feeling of Stress : Not at all  Social Connections: Socially Integrated (01/22/2022)   Social Connection and Isolation Panel [NHANES]    Frequency of Communication with Friends and Family: Never    Frequency of Social Gatherings with Friends and Family: More than three times a week     Attends Religious Services: More than 4 times per year    Active Member of [employer] or Organizations: Yes    Attends Engineer, structural: More than 4 times per year    Marital Status: Married   Review of systems General: negative for malaise, night sweats, fever, chills, weight loss Neck: Negative for lumps, goiter, pain and significant neck swelling Resp: Negative for cough, wheezing, dyspnea at rest CV: Negative for chest pain, leg swelling, palpitations, orthopnea GI: denies melena, hematochezia, nausea, vomiting, dysphagia, odyonophagia, early satiety or unintentional weight loss. +constipation/diarrhea +lower abdominal pain  MSK: Negative for joint pain or swelling, back pain, and muscle pain. Derm: Negative for itching or rash Psych: Denies depression, anxiety, memory loss, confusion. No homicidal or suicidal ideation.  Heme: Negative for prolonged bleeding, bruising easily, and swollen nodes. Endocrine: Negative for cold or heat intolerance, polyuria, polydipsia and goiter. Neuro: negative for tremor, gait imbalance, syncope and seizures. The remainder of the review of systems is noncontributory.  Physical Exam: BP 126/79 (BP Location: Left Arm, Patient Position: Sitting, Cuff Size: Normal)   Pulse 75   Temp 99.1 F (37.3 C) (Oral)   Ht 5\' 5"  (1.651 m)   Wt 127 lb 14.4 oz (58 kg)   BMI 21.28 kg/m  General:   Alert and oriented. No distress noted. Pleasant and cooperative.  Head:  Normocephalic and atraumatic. Eyes:  Conjuctiva clear without scleral icterus. Mouth:  Oral mucosa pink and moist. Good dentition. No lesions. Heart: Normal rate and rhythm, s1 and s2 heart sounds present.  Lungs: Clear lung sounds in all lobes. Respirations equal and unlabored. Abdomen:  +BS, soft, non-tender and non-distended. No rebound or guarding. No HSM or masses noted. Rectal: Joanette Gula CMA present as witness, DRE normal without obvious masses or lesions, sphincter tone WNL,  external skin tags notes. Pt tolerated without issue. Derm: No palmar erythema or jaundice Msk:  Symmetrical without gross deformities. Normal posture. Extremities:  Without edema. Neurologic:  Alert and  oriented x4 Psych:  Alert and cooperative. Normal mood and affect.  Invalid input(s): "6 MONTHS"   ASSESSMENT: CHEYANN BLECHA is  a 88 y.o. female presenting today for diarrhea secondary to constipation.  Constipation x4-5 days with now only passing liquid stool taking a laxative. She has no nausea or vomiting. No obvious masses on DRE. Suspect overflow diarrhea in setting of severe constipation. Will obtain abdominal xray and send bowel prep if this shows stool burden. She will need to be on a good bowel regimen thereafter. She has tried miralax in the past reportedly without good results and she became constipated on stool softener, appears she was intolerant to [place] many years ago. consider Linzess daily. Increase water intake, aim for atleast 64 oz per day, Increase fruits, veggies and whole grains, kiwi and prunes are especially good for constipation   PLAN:  2v abd xray  2. Bowel prep if xray shows constipation  3. Increase water intake, aim for atleast 64 oz per day Increase fruits, veggies and whole grains, kiwi and prunes are especially good for constipation 4. Needs good bowel regimen thereafter, consider linzess daily   All questions were answered, patient verbalized understanding and is in agreement with plan as outlined above.    Follow Up: 3 months   Ellagrace Yoshida L. Jeanmarie Hubert, MSN, APRN, AGNP-C Adult-Gerontology Nurse Practitioner [facility]  I have reviewed the note and agree with the APP's assessment as described in this progress note  [name], MD Gastroenterology and Hepatology [facility]

## 2023-04-23 ENCOUNTER — Encounter: Payer: Self-pay | Admitting: Family Medicine

## 2023-04-23 ENCOUNTER — Ambulatory Visit (INDEPENDENT_AMBULATORY_CARE_PROVIDER_SITE_OTHER): Payer: PPO | Admitting: Family Medicine

## 2023-04-23 VITALS — BP 110/67 | HR 81 | Ht 65.0 in | Wt 128.0 lb

## 2023-04-23 DIAGNOSIS — L508 Other urticaria: Secondary | ICD-10-CM | POA: Diagnosis not present

## 2023-04-23 DIAGNOSIS — G3184 Mild cognitive impairment, so stated: Secondary | ICD-10-CM | POA: Diagnosis not present

## 2023-04-23 DIAGNOSIS — F5104 Psychophysiologic insomnia: Secondary | ICD-10-CM | POA: Diagnosis not present

## 2023-04-23 DIAGNOSIS — I10 Essential (primary) hypertension: Secondary | ICD-10-CM | POA: Diagnosis not present

## 2023-04-23 MED ORDER — DONEPEZIL HCL 10 MG PO TABS
10.0000 mg | ORAL_TABLET | Freq: Every day | ORAL | 3 refills | Status: DC
Start: 2023-05-04 — End: 2023-09-09

## 2023-04-23 NOTE — Patient Instructions (Addendum)
F/u in early September, call if you need me sooner, MMSE at visit  The dose of the medication you are taking for memory is increased to 10 mg daily with your next fill.   The 5 mg tablet has been discontinued.   It is good that you are tolerating the medication well with no adverse side effects.  Continue to keep things organized, and pre plan trips  I am glad that you will have less abdominal pain than you did. Having the bowel movement has been very beneficial.   If you go for 3 days without a bowel movement I recommend that you use mild laxative to help your bowels move.  The longer stool stays in side also has the harder it becomes, the more difficult to pass and also causes..pain.  Hav a safe Spring into Summer!  Thanks for choosing [facility], we consider it a privelige to serve you.

## 2023-04-24 ENCOUNTER — Encounter: Payer: Self-pay | Admitting: Family Medicine

## 2023-04-24 DIAGNOSIS — L508 Other urticaria: Secondary | ICD-10-CM | POA: Insufficient documentation

## 2023-04-24 NOTE — Assessment & Plan Note (Signed)
Managed by allergist and improved on therapy

## 2023-04-24 NOTE — Assessment & Plan Note (Signed)
DASH diet and commitment to daily physical activity for a minimum of 30 minutes discussed and encouraged, as a part of hypertension management. The importance of attaining a healthy weight is also discussed.     04/23/2023    1:09 PM 04/22/2023    9:41 AM 04/21/2023   10:21 AM 04/09/2023   10:12 AM 03/05/2023    1:04 PM 01/23/2023   10:11 AM 01/23/2023   10:08 AM  BP/Weight  Systolic BP 110 126 118 146 120 132 146  Diastolic BP 67 79 62 73 77 80 77  Wt. (Lbs) 128.04 127.9 127.5  128  126.04  BMI 21.31 kg/m2 21.28 kg/m2 21.22 kg/m2  21.3 kg/m2  20.97 kg/m2

## 2023-04-24 NOTE — Assessment & Plan Note (Signed)
Increase aricept dose and re eval in 8 weeks

## 2023-04-24 NOTE — Assessment & Plan Note (Signed)
Chronic , no response to meds, lifestyle modification

## 2023-04-24 NOTE — Progress Notes (Signed)
   Kelsey Nelson     MRN: [id number]      DOB: [birth year]  Chief Complaint  Patient presents with   Follow-up    Follow up seen gastro yesterday for digestion issues    HPI Kelsey Nelson is here for follow up and re-evaluation of chronic medical conditions, medication management and review of any available recent lab and radiology data.  Preventive health is updated, specifically  Cancer screening and Immunization.   Questions or concerns regarding consultations or procedures which the PT has had in the interim are  addressed.Saw GI one day ago and spontaneously improved with no intervention The PT denies any adverse reactions to aricept and wants her dose increased to standard treating dose as she is concerned about memory loss ROS Denies recent fever or chills. Denies sinus pressure, nasal congestion, ear pain or sore throat. Denies chest congestion, productive cough or wheezing. Denies chest pains, palpitations and leg swelling Denies abdominal pain, nausea, vomiting,diarrhea or constipation.   Denies dysuria, frequency, hesitancy or incontinence. Denies joint pain, swelling and limitation in mobility. Denies headaches, seizures, numbness, or tingling. Denies depression, anxiety c/o chronic insomnia. Denies skin break down or rash.   PE  BP 110/67 (BP Location: Right Arm, Patient Position: Sitting, Cuff Size: Normal)   Pulse 81   Ht 5\' 5"  (1.651 m)   Wt 128 lb 0.6 oz (58.1 kg)   SpO2 95%   BMI 21.31 kg/m   Patient alert and oriented and in no cardiopulmonary distress.  HEENT: No facial asymmetry, EOMI,     Neck supple .  Chest: Clear to auscultation bilaterally.  CVS: S1, S2 no murmurs, no S3.Regular rate.  ABD: Soft non tender.   Ext: No edema  MS: Decreased  ROM spine, shoulders, hips and knees.  Skin: Intact, no ulcerations or rash noted.  Psych: Good eye contact, normal affect. Memory impaired not anxious or depressed appearing.  CNS: CN 2-12 intact, power,   normal throughout.no focal deficits noted.   Assessment & Plan  HTN (hypertension) DASH diet and commitment to daily physical activity for a minimum of 30 minutes discussed and encouraged, as a part of hypertension management. The importance of attaining a healthy weight is also discussed.     04/23/2023    1:09 PM 04/22/2023    9:41 AM 04/21/2023   10:21 AM 04/09/2023   10:12 AM 03/05/2023    1:04 PM 01/23/2023   10:11 AM 01/23/2023   10:08 AM  BP/Weight  Systolic BP 110 126 118 146 120 132 146  Diastolic BP 67 79 62 73 77 80 77  Wt. (Lbs) 128.04 127.9 127.5  128  126.04  BMI 21.31 kg/m2 21.28 kg/m2 21.22 kg/m2  21.3 kg/m2  20.97 kg/m2       Insomnia Chronic , no response to meds, lifestyle modification  MCI (mild cognitive impairment) Increase aricept dose and re eval in 8 weeks  Chronic urticaria Managed by allergist and improved on therapy

## 2023-04-28 ENCOUNTER — Telehealth (INDEPENDENT_AMBULATORY_CARE_PROVIDER_SITE_OTHER): Payer: Self-pay | Admitting: *Deleted

## 2023-04-28 NOTE — Telephone Encounter (Signed)
Patient calling with an update. She states she is no longer having liquid stools but is constipated. She is taking miralax one dose daily and no relief.

## 2023-04-28 NOTE — Telephone Encounter (Signed)
Discussed with patient per Kelsey Nelson - She may need to titrate up on her miralax dose.  increase to 1 capful every 12 hours. If after one week there is no improvement, increase to 1 capful every 8 hours. Patient verbalized understanding.

## 2023-05-01 ENCOUNTER — Ambulatory Visit: Admission: EM | Admit: 2023-05-01 | Discharge: 2023-05-01 | Discharge status: Absent without leave | Payer: PPO

## 2023-05-02 ENCOUNTER — Other Ambulatory Visit: Payer: Self-pay

## 2023-05-02 ENCOUNTER — Ambulatory Visit (INDEPENDENT_AMBULATORY_CARE_PROVIDER_SITE_OTHER): Payer: PPO

## 2023-05-02 ENCOUNTER — Ambulatory Visit
Admission: RE | Admit: 2023-05-02 | Discharge: 2023-05-02 | Disposition: A | Discharge status: Home / Self Care | Payer: PPO | Source: Ambulatory Visit | Attending: Physician Assistant | Admitting: Physician Assistant

## 2023-05-02 VITALS — BP 134/76 | HR 71 | Temp 98.1°F | Resp 20

## 2023-05-02 DIAGNOSIS — S9032XA Contusion of left foot, initial encounter: Secondary | ICD-10-CM

## 2023-05-02 DIAGNOSIS — S99922A Unspecified injury of left foot, initial encounter: Secondary | ICD-10-CM | POA: Diagnosis not present

## 2023-05-02 NOTE — Discharge Instructions (Signed)
Return if any problems.

## 2023-05-02 NOTE — ED Triage Notes (Signed)
Pt reports hit left foot on furniture last night and reports pain ever since. Mild limp noted into triage. Denies being on any blood thinners or past injury to LLE.

## 2023-05-02 NOTE — ED Provider Notes (Signed)
[facility]    CSN: [id number] Arrival date & time: 05/02/23  0851      History   Chief Complaint Chief Complaint  Patient presents with   Foot Injury    Entered by patient    HPI Kelsey Nelson is a 89 y.o. female.   Patient reports she hit her left foot over a table last p.m.  Patient complains of swelling and pain patient complains of pain with walking  The history is provided by the patient. No language interpreter was used.  Foot Injury Location:  Foot Foot location:  L foot   Past Medical History:  Diagnosis Date   Acute cystitis with hematuria 10/26/2013   Allergic rhinitis 11/08/2017   Blindness of right eye    BMI between 19-24,adult 2010 145 LBS   Diverticula of small intestine Dec 2010   Diverticulosis    [place]   DIVERTICULOSIS, SIGMOID COLON 03/31/2008   Qualifier: Diagnosis of  By: Lind Guest  Noted on abdominal and pelvic scan in 04/2011.  Extensive colonic diverticulosis , and possible muscular hypertrophy at rectosigmoid junction    Family hx of colon cancer 11/09/2013   Functional constipation 2010   GERD (gastroesophageal reflux disease)    HTN (hypertension)    Hyperlipemia    Pancreatic cyst 02/05/2013   Renal cyst 05/27/2011   Right eye trauma 1949   Loss of vision resulted at age 71   Schatzki's ring Dec 2010   Skin lesions, generalized 06/19/2015   Tubular adenoma 11/09/2013    Patient Active Problem List   Diagnosis Date Noted   Chronic urticaria 04/24/2023   MCI (mild cognitive impairment) 03/10/2023   Moderate major depression, single episode (HCC) 01/07/2023   Xerostomia 12/13/2022   Generalized rash 09/23/2022   CKD (chronic kidney disease) stage 3, GFR 30-59 ml/min (HCC) 09/23/2022   Acute allergic reaction 08/01/2022   Allergic dermatitis 07/19/2022   Food allergy 07/19/2022   Rash and other nonspecific skin eruption 06/21/2022   Skin lesions 01/22/2022   Abdominal spasms 01/22/2022   Blepharitis of left lower  eyelid 10/28/2021   Recurrent UTI 05/10/2021   Skipped heart beats 10/19/2020   Dermatitis 07/31/2020   COVID-19 virus infection 02/25/2020   Anterior knee pain, right 10/05/2019   Belching symptom 10/05/2019   Injury of left knee 11/28/2017   Annual physical exam 03/23/2016   Ectropion of right eye 11/22/2015   Acquired superior sulcus deformity of orbit 07/04/2015   History of evisceration of eye 07/04/2015   Nuclear sclerosis of left eye 07/04/2015   Urinary urgency 10/31/2013   Loss of taste 07/31/2012   Pancreatic cyst 04/21/2012   H/O gastroesophageal reflux (GERD) 04/21/2012   Nocturia 12/02/2011   Insomnia 09/20/2011   Nausea 06/22/2011   Constipation 06/21/2011   GERD 11/20/2009   Mixed hyperlipidemia 03/31/2008   BLINDNESS, RIGHT EYE 03/31/2008   HTN (hypertension) 03/31/2008   Osteoporosis 03/31/2008    Past Surgical History:  Procedure Laterality Date   ABDOMINAL HYSTERECTOMY     BREAST REDUCTION SURGERY  1992 BIL   COLONOSCOPY  DEC 2010   SIMPLE ADENOMAS (6-8),  TICS, SML IH   COLONOSCOPY N/A 12/08/2013   Procedure: COLONOSCOPY;  Surgeon: Malissa Hippo, MD;  Location: [facility];  Service: Endoscopy;  Laterality: N/A;  225   EVISCERATION Right 05/08/2015   Procedure: EVISCERATION WITH IMPLANT RIGHT EYE;  Surgeon: Susa Simmonds, MD;  Location: [facility];  Service: Ophthalmology;  Laterality: Right;  EYE SURGERY  RIGHT 2005   EYE SURGERY  right 2016   removed and false eye placed in 08/2015   TOTAL ABDOMINAL HYSTERECTOMY W/ BILATERAL SALPINGOOPHORECTOMY  1985 FIBROIDS   UPPER GASTROINTESTINAL ENDOSCOPY  DEC 2010   Bx-MILD GASTRITIS/DUODENITIS, DUODENAL DIVERTICULA, SCHATZKI'S RING    OB History     Gravida      Para      Term      Preterm      AB      Living  3      SAB      IAB      Ectopic      Multiple      Live Births               Home Medications    Prior to Admission medications   Medication Sig Start Date End  Date Taking? Authorizing Provider  Calcium Carbonate Antacid (TUMS PO) Take 1 tablet by mouth 3 (three) times daily as needed.    [provider]  Cholecalciferol (VITAMIN D3 PO) Take 25 mcg by mouth daily.    [provider]  conjugated estrogens (PREMARIN) vaginal cream Apply one fingertip to introitus every 2 days. 12/20/20   Kelsey Finner, NP  conjugated estrogens (PREMARIN) vaginal cream Place 1 Applicatorful vaginally every other day. 04/09/23   Kelsey, Mardene Celeste, MD  diphenhydrAMINE (BENADRYL ALLERGY) 25 mg capsule Take 1 capsule (25 mg total) by mouth every 6 (six) hours as needed. 08/06/22   Gilmore Laroche, FNP  donepezil (ARICEPT) 10 MG tablet Take 1 tablet (10 mg total) by mouth at bedtime. 05/04/23   Kelsey Perches, MD  EPINEPHrine (EPIPEN 2-PAK) 0.3 mg/0.3 mL IJ SOAJ injection Inject 0.3 mg into the muscle as needed for anaphylaxis. 12/25/22   Kelsey Spruce, MD  famotidine (PEPCID) 20 MG tablet Take 1 tablet (20 mg total) by mouth 2 (two) times daily. 08/29/22 04/21/23  Kelsey Spruce, MD  fexofenadine [facility] ALLERGY) 180 MG tablet Take 1 tablet (180 mg total) by mouth every morning. 04/21/23   Kelsey Robson, MD  fluticasone (FLONASE) 50 MCG/ACT nasal spray Place 1 spray into both nostrils daily. 04/21/23   Kelsey Robson, MD  methenamine (HIPREX) 1 g tablet Take 1 tablet (1 g total) by mouth at bedtime. 04/09/23   Kelsey, Mardene Celeste, MD  Multiple Vitamin (MULTIVITAMIN PO) Take by mouth.    [provider]  OVER THE COUNTER MEDICATION Multi Collagen Protein - Patient states that she takes 1 scoop a day.    [provider]  OVER THE COUNTER MEDICATION One daily hylands leg cramp relief    [provider]  OVER THE COUNTER MEDICATION Omega xl joint and muscle support daily    [provider]  polyethylene glycol powder (GLYCOLAX/MIRALAX) 17 GM/SCOOP powder Take 17 g by mouth daily as needed. 06/21/22   Kelsey Beers, FNP  Probiotic Product (PROBIOTIC DAILY PO) Take 1 tablet by mouth daily.    [provider]  ramipril (ALTACE) 10 MG capsule Take one capsule by mouth once daily for blood pressure 03/10/23   Kelsey Perches, MD  Kelsey Nelson 150 MG/ML prefilled syringe Inject into the skin.    [provider]    Family History Family History  Problem Relation Age of Onset   Pancreatic cancer Sister    Colon cancer Paternal Aunt    Colon polyps Neg Hx     Social History Social  History   Tobacco Use   Smoking status: Never    Passive exposure: Past   Smokeless tobacco: Never  Vaping Use   Vaping Use: Never used  Substance Use Topics   Alcohol use: No    Alcohol/week: 0.0 standard drinks of alcohol   Drug use: No     Allergies   Codeine, Crestor [rosuvastatin], and Fosamax [alendronate sodium]   Review of Systems Review of Systems  All other systems reviewed and are negative.    Physical Exam Triage Vital Signs ED Triage Vitals  Enc Vitals Group     BP 05/02/23 0958 134/76     Pulse Rate 05/02/23 0958 71     Resp 05/02/23 0958 20     Temp 05/02/23 0958 98.1 F (36.7 C)     Temp Source 05/02/23 0958 Oral     SpO2 05/02/23 0958 95 %     Weight --      Height --      Head Circumference --      Peak Flow --      Pain Score 05/02/23 0959 8     Pain Loc --      Pain Edu? --      Excl. in GC? --    No data found.  Updated Vital Signs BP 134/76 (BP Location: Right Arm)   Pulse 71   Temp 98.1 F (36.7 C) (Oral)   Resp 20   SpO2 95%   Visual Acuity Right Eye Distance:   Left Eye Distance:   Bilateral Distance:    Right Eye Near:   Left Eye Near:    Bilateral Near:     Physical Exam Vitals reviewed.  Constitutional:      Appearance: Normal appearance.  Skin:    General: Skin is warm.     Comments: Bruised swollen left foot pain with range of motion neurovascular neurosensory intact  Neurological:     General: No focal deficit present.      Mental Status: She is alert.  Psychiatric:        Mood and Affect: Mood normal.      UC Treatments / Results  Labs (all labs ordered are listed, but only abnormal results are displayed) Labs Reviewed - No data to display  EKG   Radiology DG Foot Complete Left  Result Date: 05/02/2023 CLINICAL DATA:  Left foot injury, pain EXAM: LEFT FOOT - COMPLETE 3+ VIEW COMPARISON:  None Available. FINDINGS: Early joint space narrowing in the 1st MTP joint. No acute bony abnormality. Specifically, no fracture, subluxation, or dislocation. Soft tissues are intact. IMPRESSION: No acute bony abnormality. Electronically Signed   By: Charlett Nose M.D.   On: 05/02/2023 10:17    Procedures Procedures (including critical care time)  Medications Ordered in UC Medications - No data to display  Initial Impression / Assessment and Plan / UC Course  I have reviewed the triage vital signs and the nursing notes.  Pertinent labs & imaging results that were available during my care of the patient were reviewed by me and considered in my medical decision making (see chart for details).     AM x-ray shows no evidence of fracture patient placed in an Ace wrap and postop shoe she is advised Tylenol or ibuprofen for discomfort Final Clinical Impressions(s) / UC Diagnoses   Final diagnoses:  Contusion of left foot, initial encounter     Discharge Instructions      Return if any problems.  ED Prescriptions   None    PDMP not reviewed this encounter. An After Visit Summary was printed and given to the patient.       Elson Areas, [place] 05/02/23 1431

## 2023-05-05 ENCOUNTER — Ambulatory Visit (INDEPENDENT_AMBULATORY_CARE_PROVIDER_SITE_OTHER): Payer: PPO | Admitting: Gastroenterology

## 2023-05-05 DIAGNOSIS — I10 Essential (primary) hypertension: Secondary | ICD-10-CM | POA: Diagnosis not present

## 2023-05-05 DIAGNOSIS — N390 Urinary tract infection, site not specified: Secondary | ICD-10-CM | POA: Diagnosis not present

## 2023-05-05 DIAGNOSIS — J309 Allergic rhinitis, unspecified: Secondary | ICD-10-CM | POA: Diagnosis not present

## 2023-05-05 DIAGNOSIS — N952 Postmenopausal atrophic vaginitis: Secondary | ICD-10-CM | POA: Diagnosis not present

## 2023-05-05 DIAGNOSIS — E785 Hyperlipidemia, unspecified: Secondary | ICD-10-CM | POA: Diagnosis not present

## 2023-05-05 DIAGNOSIS — H9193 Unspecified hearing loss, bilateral: Secondary | ICD-10-CM | POA: Diagnosis not present

## 2023-05-05 DIAGNOSIS — K219 Gastro-esophageal reflux disease without esophagitis: Secondary | ICD-10-CM | POA: Diagnosis not present

## 2023-05-05 DIAGNOSIS — Z9849 Cataract extraction status, unspecified eye: Secondary | ICD-10-CM | POA: Diagnosis not present

## 2023-05-05 DIAGNOSIS — G4700 Insomnia, unspecified: Secondary | ICD-10-CM | POA: Diagnosis not present

## 2023-05-13 ENCOUNTER — Telehealth (INDEPENDENT_AMBULATORY_CARE_PROVIDER_SITE_OTHER): Payer: Self-pay | Admitting: *Deleted

## 2023-05-13 NOTE — Telephone Encounter (Signed)
Pt seen 4/30 and calling with update. States she is no better. She cannot have BM unless she takes a laxative. She is taking miralax one capful daily. No diarrhea, no abdominal pain, no fever, no rectal bleeding. She states you mention doing tcs is no better.   [phone]

## 2023-05-14 NOTE — Telephone Encounter (Signed)
Left message to return call 

## 2023-05-15 MED ORDER — PEG 3350-KCL-NA BICARB-NACL 420 G PO SOLR: 4000.0000 mL | Freq: Once | ORAL | 0 refills | Status: AC

## 2023-05-15 NOTE — Telephone Encounter (Signed)
Pt contacted and TCS scheduled. Prep sent to pharmacy. Pt requested instructions be mailed to her. Will send letter with pre op information.

## 2023-05-15 NOTE — Telephone Encounter (Signed)
Kelsey Nelson spoke with patient and got her scheduled for tcs

## 2023-05-16 ENCOUNTER — Ambulatory Visit: Payer: Medicare PPO | Admitting: Family Medicine

## 2023-05-16 ENCOUNTER — Encounter (INDEPENDENT_AMBULATORY_CARE_PROVIDER_SITE_OTHER): Payer: Self-pay

## 2023-05-21 ENCOUNTER — Ambulatory Visit: Payer: Medicare PPO

## 2023-05-21 ENCOUNTER — Ambulatory Visit
Admission: EM | Admit: 2023-05-21 | Discharge: 2023-05-21 | Disposition: A | Discharge status: Home / Self Care | Payer: PPO | Attending: Nurse Practitioner | Admitting: Nurse Practitioner

## 2023-05-21 ENCOUNTER — Ambulatory Visit (INDEPENDENT_AMBULATORY_CARE_PROVIDER_SITE_OTHER): Payer: PPO

## 2023-05-21 DIAGNOSIS — R198 Other specified symptoms and signs involving the digestive system and abdomen: Secondary | ICD-10-CM | POA: Diagnosis not present

## 2023-05-21 DIAGNOSIS — Z8719 Personal history of other diseases of the digestive system: Secondary | ICD-10-CM | POA: Diagnosis not present

## 2023-05-21 DIAGNOSIS — M25562 Pain in left knee: Secondary | ICD-10-CM

## 2023-05-21 DIAGNOSIS — M25462 Effusion, left knee: Secondary | ICD-10-CM | POA: Diagnosis not present

## 2023-05-21 DIAGNOSIS — M7989 Other specified soft tissue disorders: Secondary | ICD-10-CM | POA: Diagnosis not present

## 2023-05-21 MED ORDER — OMEPRAZOLE 20 MG PO CPDR: 20.0000 mg | DELAYED_RELEASE_CAPSULE | Freq: Every day | ORAL | 0 refills | Status: DC

## 2023-05-21 MED ORDER — LIDOCAINE VISCOUS HCL 2 % MT SOLN
15.0000 mL | Freq: Once | OROMUCOSAL | Status: AC
  Administered 2023-05-21: 15 mL via OROMUCOSAL

## 2023-05-21 MED ORDER — ALUM & MAG HYDROXIDE-SIMETH 200-200-20 MG/5ML PO SUSP
30.0000 mL | Freq: Once | ORAL | Status: AC
  Administered 2023-05-21: 30 mL via ORAL

## 2023-05-21 NOTE — Discharge Instructions (Addendum)
The x-ray of your knee is negative for fracture or dislocation.  You do have swelling and a small amount of fluid on the knee. I provided an Ace wrap for you to provide compression and support of the left knee.  Wear the Ace wrap with prolonged or strenuous activity. RICE therapy, rest, ice, compression, and elevation.  To help with the swelling, keep the left leg elevated is much as possible.  Also apply ice as needed.  Apply for 20 minutes, remove for 1 hour, then repeat as needed.  Do not apply the ice directly to the skin. May take over-the-counter Tylenol arthritis strength 650 mg tablets as needed for pain or discomfort. If symptoms do not improve, would recommend you follow-up with orthopedics for further evaluation.  You can follow-up with [facility] at [phone] or with [facility] at [phone].  For your reflux symptoms: Take medication as prescribed. You can continue famotidine 20 mg as needed for breakthrough reflux symptoms. Avoid greasy, spicy, fried, citrus foods, and be mindful that caffeine, carbonated drinks, chocolate and alcohol can increase reflux symptoms. Stay upright 2-3 hours after eating, prior to lying down and avoid eating late in the evenings.  Go to the emergency department immediately if you experience abdominal pain with nausea, vomiting or other concerns. If symptoms continue to persist, please follow-up with your primary care physician or with gastroenterology for further evaluation.  Follow-up as needed.

## 2023-05-21 NOTE — ED Provider Notes (Signed)
[facility]    CSN: [id number] Arrival date & time: 05/21/23  0803      History   Chief Complaint No chief complaint on file.   HPI Kelsey Nelson is a 89 y.o. female.   The history is provided by the patient.   The patient presents for complaints of left knee pain that has been present over the past week.  She also complains of belching, gas, and bloating.  Patient states the knee pain started after she tried to catch a man that weighed approximately 200 pounds.  She states that he fell onto her left leg.  She states that she is able to ambulate, but has pain in the left knee.  The area is also bruised.  She denies tingling, numbness, or radiation of pain.    She also presents for complaints of gas, belching, eating.  Patient reports that she has a history of constipation.  She reports that she took MiraLAX last evening morning.  She also states that she does have a history of reflux disease.  She states that she normally takes Pepcid for her symptoms.  Patient is scheduled for a colonoscopy in July.  Patient states that she is also under the care of GI.  Patient informs that she tries her best to follow the recommendations by GI to decrease her reflux symptoms. She reports that she currently is not on her medications for her symptoms at this time.  She denies fever, chills, chest pain, abdominal pain, nausea, vomiting, or diarrhea.  Past Medical History:  Diagnosis Date   Acute cystitis with hematuria 10/26/2013   Allergic rhinitis 11/08/2017   Blindness of right eye    BMI between 19-24,adult 2010 145 LBS   Diverticula of small intestine Dec 2010   Diverticulosis    [place]   DIVERTICULOSIS, SIGMOID COLON 03/31/2008   Qualifier: Diagnosis of  By: Lind Guest  Noted on abdominal and pelvic scan in 04/2011.  Extensive colonic diverticulosis , and possible muscular hypertrophy at rectosigmoid junction    Family hx of colon cancer 11/09/2013   Functional constipation 2010    GERD (gastroesophageal reflux disease)    HTN (hypertension)    Hyperlipemia    Pancreatic cyst 02/05/2013   Renal cyst 05/27/2011   Right eye trauma 1949   Loss of vision resulted at age 35   Schatzki's ring Dec 2010   Skin lesions, generalized 06/19/2015   Tubular adenoma 11/09/2013    Patient Active Problem List   Diagnosis Date Noted   Chronic urticaria 04/24/2023   MCI (mild cognitive impairment) 03/10/2023   Moderate major depression, single episode (HCC) 01/07/2023   Xerostomia 12/13/2022   Generalized rash 09/23/2022   CKD (chronic kidney disease) stage 3, GFR 30-59 ml/min (HCC) 09/23/2022   Acute allergic reaction 08/01/2022   Allergic dermatitis 07/19/2022   Food allergy 07/19/2022   Rash and other nonspecific skin eruption 06/21/2022   Skin lesions 01/22/2022   Abdominal spasms 01/22/2022   Blepharitis of left lower eyelid 10/28/2021   Recurrent UTI 05/10/2021   Skipped heart beats 10/19/2020   Dermatitis 07/31/2020   COVID-19 virus infection 02/25/2020   Anterior knee pain, right 10/05/2019   Belching symptom 10/05/2019   Injury of left knee 11/28/2017   Annual physical exam 03/23/2016   Ectropion of right eye 11/22/2015   Acquired superior sulcus deformity of orbit 07/04/2015   History of evisceration of eye 07/04/2015   Nuclear sclerosis of left eye 07/04/2015   Urinary  urgency 10/31/2013   Loss of taste 07/31/2012   Pancreatic cyst 04/21/2012   H/O gastroesophageal reflux (GERD) 04/21/2012   Nocturia 12/02/2011   Insomnia 09/20/2011   Nausea 06/22/2011   Constipation 06/21/2011   GERD 11/20/2009   Mixed hyperlipidemia 03/31/2008   BLINDNESS, RIGHT EYE 03/31/2008   HTN (hypertension) 03/31/2008   Osteoporosis 03/31/2008    Past Surgical History:  Procedure Laterality Date   ABDOMINAL HYSTERECTOMY     BREAST REDUCTION SURGERY  1992 BIL   COLONOSCOPY  DEC 2010   SIMPLE ADENOMAS (6-8), [place] [facility], [facility]   COLONOSCOPY N/A 12/08/2013   Procedure:  COLONOSCOPY;  Surgeon: Malissa Hippo, MD;  Location: [facility];  Service: Endoscopy;  Laterality: N/A;  225   EVISCERATION Right 05/08/2015   Procedure: EVISCERATION WITH IMPLANT RIGHT EYE;  Surgeon: Susa Simmonds, MD;  Location: [facility];  Service: Ophthalmology;  Laterality: Right;   EYE SURGERY  RIGHT 2005   EYE SURGERY  right 2016   removed and false eye placed in 08/2015   TOTAL ABDOMINAL HYSTERECTOMY W/ BILATERAL SALPINGOOPHORECTOMY  1985 FIBROIDS   UPPER GASTROINTESTINAL ENDOSCOPY  DEC 2010   Bx-MILD GASTRITIS/DUODENITIS, DUODENAL DIVERTICULA, SCHATZKI'S RING    OB History     Gravida      Para      Term      Preterm      AB      Living  3      SAB      IAB      Ectopic      Multiple      Live Births               Home Medications    Prior to Admission medications   Medication Sig Start Date End Date Taking? Authorizing Provider  omeprazole (PRILOSEC) 20 MG capsule Take 1 capsule (20 mg total) by mouth daily. 05/21/23  Yes Riya Huxford-Warren, Sadie Haber, NP  Calcium Carbonate Antacid (TUMS PO) Take 1 tablet by mouth 3 (three) times daily as needed.    [provider]  Cholecalciferol (VITAMIN D3 PO) Take 25 mcg by mouth daily.    [provider]  conjugated estrogens (PREMARIN) vaginal cream Apply one fingertip to introitus every 2 days. 12/20/20   Freddy Finner, NP  conjugated estrogens (PREMARIN) vaginal cream Place 1 Applicatorful vaginally every other day. 04/09/23   McKenzie, Mardene Celeste, MD  diphenhydrAMINE (BENADRYL ALLERGY) 25 mg capsule Take 1 capsule (25 mg total) by mouth every 6 (six) hours as needed. 08/06/22   Gilmore Laroche, FNP  donepezil (ARICEPT) 10 MG tablet Take 1 tablet (10 mg total) by mouth at bedtime. 05/04/23   Kerri Perches, MD  EPINEPHrine (EPIPEN 2-PAK) 0.3 mg/0.3 mL IJ SOAJ injection Inject 0.3 mg into the muscle as needed for anaphylaxis. 12/25/22   Alfonse Spruce, MD  famotidine (PEPCID) 20 MG  tablet Take 1 tablet (20 mg total) by mouth 2 (two) times daily. 08/29/22 04/21/23  Alfonse Spruce, MD  fexofenadine [facility] ALLERGY) 180 MG tablet Take 1 tablet (180 mg total) by mouth every morning. 04/21/23   Birder Robson, MD  fluticasone (FLONASE) 50 MCG/ACT nasal spray Place 1 spray into both nostrils daily. 04/21/23   Birder Robson, MD  methenamine (HIPREX) 1 g tablet Take 1 tablet (1 g total) by mouth at bedtime. 04/09/23   McKenzie, Mardene Celeste, MD  Multiple Vitamin (MULTIVITAMIN PO) Take by mouth.    [provider]  OVER THE COUNTER MEDICATION Multi Collagen Protein - Patient states that she takes 1 scoop a day.    [provider]  OVER THE COUNTER MEDICATION One daily hylands leg cramp relief    [provider]  OVER THE COUNTER MEDICATION Omega xl joint and muscle support daily    [provider]  polyethylene glycol powder (GLYCOLAX/MIRALAX) 17 GM/SCOOP powder Take 17 g by mouth daily as needed. 06/21/22   Donell Beers, FNP  Probiotic Product (PROBIOTIC DAILY PO) Take 1 tablet by mouth daily.    [provider]  ramipril (ALTACE) 10 MG capsule Take one capsule by mouth once daily for blood pressure 03/10/23   Kerri Perches, MD  Geoffry Paradise 150 MG/ML prefilled syringe Inject into the skin.    [provider]    Family History Family History  Problem Relation Age of Onset   Pancreatic cancer Sister    Colon cancer Paternal Aunt    Colon polyps Neg Hx     Social History Social History   Tobacco Use   Smoking status: Never    Passive exposure: Past   Smokeless tobacco: Never  Vaping Use   Vaping Use: Never used  Substance Use Topics   Alcohol use: No    Alcohol/week: 0.0 standard drinks of alcohol   Drug use: No     Allergies   Codeine, Crestor [rosuvastatin], and Fosamax [alendronate sodium]   Review of Systems Review of Systems Per HPI  Physical Exam Triage Vital Signs ED Triage Vitals  Enc  Vitals Group     BP 05/21/23 0812 (!) 164/90     Pulse Rate 05/21/23 0812 72     Resp 05/21/23 0812 15     Temp 05/21/23 0812 97.6 F (36.4 C)     Temp Source 05/21/23 0812 Oral     SpO2 05/21/23 0812 96 %     Weight --      Height --      Head Circumference --      Peak Flow --      Pain Score 05/21/23 0816 10     Pain Loc --      Pain Edu? --      Excl. in GC? --    No data found.  Updated Vital Signs BP (!) 164/90 (BP Location: Right Arm)   Pulse 72   Temp 97.6 F (36.4 C) (Oral)   Resp 15   SpO2 96%   Visual Acuity Right Eye Distance:   Left Eye Distance:   Bilateral Distance:    Right Eye Near:   Left Eye Near:    Bilateral Near:     Physical Exam Vitals and nursing note reviewed.  Constitutional:      General: She is not in acute distress.    Appearance: Normal appearance.  HENT:     Head: Normocephalic.  Eyes:     Extraocular Movements: Extraocular movements intact.     Pupils: Pupils are equal, round, and reactive to light.  Cardiovascular:     Rate and Rhythm: Normal rate and regular rhythm.     Pulses: Normal pulses.     Heart sounds: Normal heart sounds.  Pulmonary:     Effort: Pulmonary effort is normal. No respiratory distress.     Breath sounds: Normal breath sounds. No stridor. No wheezing, rhonchi or rales.  Abdominal:     General: Bowel sounds are normal.     Palpations: Abdomen is soft.  Tenderness: There is no abdominal tenderness.  Musculoskeletal:     Cervical back: Normal range of motion.     Left knee: Swelling and ecchymosis present. Tenderness present over the LCL and PCL. Normal pulse.  Skin:    General: Skin is warm and dry.  Neurological:     General: No focal deficit present.     Mental Status: She is alert and oriented to person, place, and time.  Psychiatric:        Mood and Affect: Mood normal.        Behavior: Behavior normal.      UC Treatments / Results  Labs (all labs ordered are listed, but only abnormal  results are displayed) Labs Reviewed - No data to display  EKG   Radiology DG Knee Complete 4 Views Left  Result Date: 05/21/2023 CLINICAL DATA:  89 year old female status post fall. Swelling, pain and bruising. EXAM: LEFT KNEE - COMPLETE 4+ VIEW COMPARISON:  None Available. FINDINGS: Bone mineralization is within normal limits for age. Maintained alignment. Mild for age medial compartment joint space loss. Patella appears intact. No acute osseous abnormality identified. There is a small suprapatellar joint effusion visible on the lateral. Medial patchy soft tissue swelling and stranding as well. No other discrete soft tissue injury. IMPRESSION: Small joint effusion and medial soft tissue swelling. No acute fracture or dislocation identified about the left knee. Electronically Signed   By: Odessa Fleming M.D.   On: 05/21/2023 08:58    Procedures Procedures (including critical care time)  Medications Ordered in UC Medications  alum & mag hydroxide-simeth (MAALOX/MYLANTA) 200-200-20 MG/5ML suspension 30 mL (30 mLs Oral Given 05/21/23 0843)  lidocaine (XYLOCAINE) 2 % viscous mouth solution 15 mL (15 mLs Mouth/Throat Given 05/21/23 0843)    Initial Impression / Assessment and Plan / UC Course  I have reviewed the triage vital signs and the nursing notes.  Pertinent labs & imaging results that were available during my care of the patient were reviewed by me and considered in my medical decision making (see chart for details).  The patient is well-appearing, she is in no acute distress, vital signs are stable.  X-ray of the left knee was negative for fracture or dislocation, patient does have a small joint effusion and swelling of the medial left knee.  Will provide an Ace wrap to help with swelling additional support of the left knee.  With regard to her GI symptoms, will start patient on omeprazole 20 mg daily until she can follow-up with GI for further evaluation.  Patient advised to avoid spicy  foods, tomato-based foods, mint based foods, caffeine, and to eat small meals, along with eating approximately 2 to 3 hours before bedtime.  Patient was given strict ER follow-up precautions.  Patient advised that if symptoms do not improve, would like her to follow-up with her primary care physician/GI or with orthopedics for further evaluation.  Patient is in agreement with this plan is understanding.  All questions were answered.  Patient stable for discharge.   Final Clinical Impressions(s) / UC Diagnoses   Final diagnoses:  Left knee pain, unspecified chronicity  Symptoms of gastroesophageal reflux  History of gastroesophageal reflux (GERD)     Discharge Instructions      The x-ray of your knee is negative for fracture or dislocation.  You do have swelling and a small amount of fluid on the knee. I provided an Ace wrap for you to provide compression and support of the left knee.  Wear the Ace wrap with prolonged or strenuous activity. RICE therapy, rest, ice, compression, and elevation.  To help with the swelling, keep the left leg elevated is much as possible.  Also apply ice as needed.  Apply for 20 minutes, remove for 1 hour, then repeat as needed.  Do not apply the ice directly to the skin. May take over-the-counter Tylenol arthritis strength 650 mg tablets as needed for pain or discomfort. If symptoms do not improve, would recommend you follow-up with orthopedics for further evaluation.  You can follow-up with [facility] at [phone] or with [facility] at [phone].  For your reflux symptoms: Take medication as prescribed. You can continue famotidine 20 mg as needed for breakthrough reflux symptoms. Avoid greasy, spicy, fried, citrus foods, and be mindful that caffeine, carbonated drinks, chocolate and alcohol can increase reflux symptoms. Stay upright 2-3 hours after eating, prior to lying down and avoid eating late in the evenings.  Go to the emergency  department immediately if you experience abdominal pain with nausea, vomiting or other concerns. If symptoms continue to persist, please follow-up with your primary care physician or with gastroenterology for further evaluation.  Follow-up as needed.     ED Prescriptions     Medication Sig Dispense Auth. Provider   omeprazole (PRILOSEC) 20 MG capsule Take 1 capsule (20 mg total) by mouth daily. 30 capsule Shawanda Sievert-Warren, Sadie Haber, NP      PDMP not reviewed this encounter.   Abran Cantor, NP 05/21/23 1035

## 2023-05-21 NOTE — ED Triage Notes (Signed)
Pt c/o leg pain pt states she fell a week ago today, and the pain in her leg hasn't gotten better  since, pt states she has "gas in her chest that is going to her leg injury" its causing burping and some discomfort.

## 2023-05-26 ENCOUNTER — Encounter: Payer: Self-pay | Admitting: Internal Medicine

## 2023-05-26 ENCOUNTER — Ambulatory Visit (INDEPENDENT_AMBULATORY_CARE_PROVIDER_SITE_OTHER): Payer: PPO

## 2023-05-26 ENCOUNTER — Ambulatory Visit (INDEPENDENT_AMBULATORY_CARE_PROVIDER_SITE_OTHER): Payer: PPO | Admitting: Internal Medicine

## 2023-05-26 VITALS — BP 148/85 | HR 88 | Ht 65.0 in | Wt 123.0 lb

## 2023-05-26 DIAGNOSIS — L501 Idiopathic urticaria: Secondary | ICD-10-CM | POA: Diagnosis not present

## 2023-05-26 DIAGNOSIS — Z0000 Encounter for general adult medical examination without abnormal findings: Secondary | ICD-10-CM

## 2023-05-26 MED ORDER — OMALIZUMAB 150 MG/ML ~~LOC~~ SOSY
300.0000 mg | PREFILLED_SYRINGE | SUBCUTANEOUS | Status: AC
  Administered 2023-05-26 – 2023-09-15 (×5): 300 mg via SUBCUTANEOUS

## 2023-05-26 NOTE — Patient Instructions (Signed)
  Ms. Sasnett , Thank you for taking time to come for your Medicare Wellness Visit. I appreciate your ongoing commitment to your health goals. Please review the following plan we discussed and let me know if I can assist you in the future.   These are the goals we discussed:  Goals      DIET - INCREASE WATER INTAKE        This is a list of the screening recommended for you and due dates:  Health Maintenance  Topic Date Due   Medicare Annual Wellness Visit  01/22/2023   Flu Shot  07/24/2023   DTaP/Tdap/Td vaccine (3 - Td or Tdap) 06/04/2025   Pneumonia Vaccine  Completed   DEXA scan (bone density measurement)  Completed   Zoster (Shingles) Vaccine  Completed   HPV Vaccine  Aged Out   COVID-19 Vaccine  Discontinued

## 2023-05-26 NOTE — Progress Notes (Signed)
Subjective:   Kelsey Nelson is a 89 y.o. female who presents for Medicare Annual (Subsequent) preventive examination.  Review of Systems    Review of Systems  All other systems reviewed and are negative.   Objective:    Today's Vitals   05/26/23 1023 05/26/23 1028  BP: (!) 148/85   Pulse: 88   SpO2: 94%   Weight: 123 lb 0.6 oz (55.8 kg)   Height: 5\' 5"  (1.651 m)   PainSc:  8    Body mass index is 20.47 kg/m.     05/26/2023   10:34 AM 01/22/2022   11:00 AM 01/20/2022    7:40 PM 01/18/2021    1:57 PM 05/05/2019    7:01 AM 01/06/2019    8:17 AM 12/04/2018   10:17 AM  Advanced Directives  Does Patient Have a Medical Advance Directive? No Yes No No;Yes Yes Yes No;Yes  Type of Special educational needs teacher of [place];Living will;Out of facility DNR (pink MOST or yellow form)  Living will Healthcare Power of [place];Living will Healthcare Power of [employer] of [employer];Living will  Does patient want to make changes to medical advance directive?    No - Patient declined No - Patient declined No - Patient declined   Copy of Healthcare Power of Attorney in Chart?  Yes - validated most recent copy scanned in chart (See row information)   No - copy requested Yes - validated most recent copy scanned in chart (See row information)   Would patient like information on creating a medical advance directive? Yes (MAU/Ambulatory/Procedural Areas - Information given) No - Patient declined No - Patient declined No - Patient declined       Current Medications (verified) Outpatient Encounter Medications as of 05/26/2023  Medication Sig   Calcium Carbonate Antacid (TUMS PO) Take 1 tablet by mouth 3 (three) times daily as needed.   Cholecalciferol (VITAMIN D3 PO) Take 25 mcg by mouth daily.   conjugated estrogens (PREMARIN) vaginal cream Apply one fingertip to introitus every 2 days.   conjugated estrogens (PREMARIN) vaginal cream Place 1 Applicatorful vaginally every other  day.   diphenhydrAMINE (BENADRYL ALLERGY) 25 mg capsule Take 1 capsule (25 mg total) by mouth every 6 (six) hours as needed.   donepezil (ARICEPT) 10 MG tablet Take 1 tablet (10 mg total) by mouth at bedtime.   EPINEPHrine (EPIPEN 2-PAK) 0.3 mg/0.3 mL IJ SOAJ injection Inject 0.3 mg into the muscle as needed for anaphylaxis.   famotidine (PEPCID) 20 MG tablet Take 1 tablet (20 mg total) by mouth 2 (two) times daily.   fexofenadine (ALLEGRA ALLERGY) 180 MG tablet Take 1 tablet (180 mg total) by mouth every morning.   fluticasone (FLONASE) 50 MCG/ACT nasal spray Place 1 spray into both nostrils daily.   methenamine (HIPREX) 1 g tablet Take 1 tablet (1 g total) by mouth at bedtime.   Multiple Vitamin (MULTIVITAMIN PO) Take by mouth.   omeprazole (PRILOSEC) 20 MG capsule Take 1 capsule (20 mg total) by mouth daily.   OVER THE COUNTER MEDICATION Multi Collagen Protein - Patient states that she takes 1 scoop a day.   OVER THE COUNTER MEDICATION One daily hylands leg cramp relief   OVER THE COUNTER MEDICATION Omega xl joint and muscle support daily   polyethylene glycol powder (GLYCOLAX/MIRALAX) 17 GM/SCOOP powder Take 17 g by mouth daily as needed.   Probiotic Product (PROBIOTIC DAILY PO) Take 1 tablet by mouth daily.   ramipril (ALTACE) 10 MG  capsule Take one capsule by mouth once daily for blood pressure   XOLAIR 150 MG/ML prefilled syringe Inject into the skin.   Facility-Administered Encounter Medications as of 05/26/2023  Medication   omalizumab Geoffry Paradise) injection 150 mg    Allergies (verified) Codeine, Crestor [rosuvastatin], and Fosamax [alendronate sodium]   History: Past Medical History:  Diagnosis Date   Acute cystitis with hematuria 10/26/2013   Allergic rhinitis 11/08/2017   Blindness of right eye    BMI between 19-24,adult 2010 145 LBS   Diverticula of small intestine Dec 2010   Diverticulosis    [place]   DIVERTICULOSIS, SIGMOID COLON 03/31/2008   Qualifier: Diagnosis of  By:  Lind Guest  Noted on abdominal and pelvic scan in 04/2011.  Extensive colonic diverticulosis , and possible muscular hypertrophy at rectosigmoid junction    Family hx of colon cancer 11/09/2013   Functional constipation 2010   GERD (gastroesophageal reflux disease)    HTN (hypertension)    Hyperlipemia    Pancreatic cyst 02/05/2013   Renal cyst 05/27/2011   Right eye trauma 1949   Loss of vision resulted at age 27   Schatzki's ring Dec 2010   Skin lesions, generalized 06/19/2015   Tubular adenoma 11/09/2013   Past Surgical History:  Procedure Laterality Date   ABDOMINAL HYSTERECTOMY     BREAST REDUCTION SURGERY  1992 BIL   COLONOSCOPY  DEC 2010   SIMPLE ADENOMAS (6-8), [place] TICS, SML IH   COLONOSCOPY N/A 12/08/2013   Procedure: COLONOSCOPY;  Surgeon: Malissa Hippo, MD;  Location: [facility];  Service: Endoscopy;  Laterality: N/A;  225   EVISCERATION Right 05/08/2015   Procedure: EVISCERATION WITH IMPLANT RIGHT EYE;  Surgeon: Susa Simmonds, MD;  Location: [facility];  Service: Ophthalmology;  Laterality: Right;   EYE SURGERY  RIGHT 2005   EYE SURGERY  right 2016   removed and false eye placed in 08/2015   TOTAL ABDOMINAL HYSTERECTOMY W/ BILATERAL SALPINGOOPHORECTOMY  1985 FIBROIDS   UPPER GASTROINTESTINAL ENDOSCOPY  DEC 2010   Bx-MILD GASTRITIS/DUODENITIS, DUODENAL DIVERTICULA, SCHATZKI'S RING   Family History  Problem Relation Age of Onset   Pancreatic cancer Sister    Colon cancer Paternal Aunt    Colon polyps Neg Hx    Social History   Socioeconomic History   Marital status: Married    Spouse name: Onalee Hua    Number of children: 3   Years of education: 12   Highest education level: 12th grade  Occupational History   Occupation: retired   Tobacco Use   Smoking status: Never    Passive exposure: Past   Smokeless tobacco: Never  Vaping Use   Vaping Use: Never used  Substance and Sexual Activity   Alcohol use: No    Alcohol/week: 0.0 standard drinks of alcohol    Drug use: No   Sexual activity: Not Currently  Other Topics Concern   Not on file  Social History Narrative   MARRIED TO DAVID Spragg. 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/   Social Determinants of Health   Financial Resource Strain: Low Risk  (01/22/2022)   Overall Financial Resource Strain (CARDIA)    Difficulty of Paying Living Expenses: Not hard at all  Food Insecurity: No Food Insecurity (01/22/2022)   Hunger Vital Sign    Worried About Running Out of Food in the Last Year: Never true    Ran Out of Food in the Last Year: Never true  Transportation Needs: No Transportation Needs (01/22/2022)   PRAPARE -  Administrator, Civil Service (Medical): No    Lack of Transportation (Non-Medical): No  Physical Activity: Sufficiently Active (01/22/2022)   Exercise Vital Sign    Days of Exercise per Week: 5 days    Minutes of Exercise per Session: 60 min  Stress: No Stress Concern Present (01/22/2022)   [employer] of Occupational Health - Occupational Stress Questionnaire    Feeling of Stress : Not at all  Social Connections: Socially Integrated (01/22/2022)   Social Connection and Isolation Panel [NHANES]    Frequency of Communication with Friends and Family: Never    Frequency of Social Gatherings with Friends and Family: More than three times a week    Attends Religious Services: More than 4 times per year    Active Member of [employer] or Organizations: Yes    Attends Engineer, structural: More than 4 times per year    Marital Status: Married    Tobacco Counseling Counseling given: Not Answered   Clinical Intake:  Pre-visit preparation completed: Yes  Pain : 0-10 Pain Score: 8  Pain Location: Other (Comment) Pain Descriptors / Indicators: Aching Pain Onset: More than a month ago Pain Frequency: Intermittent     Diabetes: No  How often do you need to have someone help you when you read instructions, pamphlets, or other written materials from your doctor or  pharmacy?: 1 - Never What is the last grade level you completed in school?: 12th grade   Activities of Daily Living    05/26/2023   10:36 AM  In your present state of health, do you have any difficulty performing the following activities:  Hearing? 0  Vision? 0  Difficulty concentrating or making decisions? 0  Walking or climbing stairs? 0  Dressing or bathing? 0  Doing errands, shopping? 0    Patient Care Team: Kerri Perches, MD as PCP - General (Family Medicine) West Bali, MD (Inactive) (Gastroenterology) Malissa Hippo, MD (Inactive) (Gastroenterology) Redgie Grayer, MD as Referring Physician (Internal Medicine) Gavin Pound, [facility] (Inactive) (Pharmacist)  Indicate any recent Medical Services you may have received from other than [facility] providers in the past year (date may be approximate).     Assessment:   This is a routine wellness examination for [place].  Hearing/Vision screen No results found.  Dietary issues and exercise activities discussed:     Goals Addressed             This Visit's Progress    DIET - INCREASE WATER INTAKE         Depression Screen    05/26/2023   10:35 AM 03/05/2023    1:06 PM 01/23/2023   10:10 AM 01/01/2023    9:39 AM 01/01/2023    9:23 AM 12/10/2022   10:00 AM 10/28/2022    1:22 PM  PHQ 2/9 Scores  PHQ - 2 Score 0 0 2 2 2  0 6  PHQ- 9 Score  4 13 14 11  23     Fall Risk    05/26/2023   10:35 AM 03/05/2023    1:05 PM 01/23/2023   10:09 AM 01/01/2023    9:23 AM 12/10/2022   10:00 AM  Fall Risk   Falls in the past year? 1 0 0 0 0  Number falls in past yr: 0 0 0 0 0  Injury with Fall? 0 0 0 0 0  Risk for fall due to :  No Fall Risks No Fall Risks No Fall Risks  Follow up  Falls evaluation completed Falls evaluation completed Falls evaluation completed     FALL RISK PREVENTION PERTAINING TO THE HOME:  Any stairs in or around the home? Yes  If so, are there any without handrails? Yes  Home free of loose  throw rugs in walkways, pet beds, electrical cords, etc? Yes  Adequate lighting in your home to reduce risk of falls? Yes   ASSISTIVE DEVICES UTILIZED TO PREVENT FALLS:  Life alert? No  Use of a cane, walker or w/c? No  Grab bars in the bathroom? No  Shower chair or bench in shower? Yes  Elevated toilet seat or a handicapped toilet? No   TIMED UP AND GO:  Was the test performed? Yes .  Length of time to ambulate 10 feet: 10 sec.   Gait steady and fast without use of assistive device  Cognitive Function:        05/26/2023   10:36 AM 01/22/2022   11:03 AM 01/18/2021    2:02 PM 05/30/2020    2:22 PM 01/13/2020   10:08 AM  6CIT Screen  What Year? 0 points 0 points 0 points 0 points 0 points  What month? 0 points 0 points 0 points 0 points 0 points  What time? 0 points 0 points 0 points 0 points 0 points  Count back from 20 0 points 0 points 0 points 0 points 0 points  Months in reverse 0 points 0 points 0 points 0 points 0 points  Repeat phrase 0 points 0 points 0 points 0 points 0 points  Total Score 0 points 0 points 0 points 0 points 0 points    Immunizations Immunization History  Administered Date(s) Administered   Fluad Quad(high Dose 65+) 10/11/2019, 09/12/2020, 09/18/2022   Influenza Split 09/24/2012, 10/19/2015   Influenza Whole 12/31/2006, 11/06/2009, 10/29/2011   Influenza, High Dose Seasonal PF 09/28/2018   Influenza,inj,Quad PF,6+ Mos 10/26/2013, 12/05/2014, 10/25/2016   Influenza-Unspecified 10/06/2017, 10/06/2021   Janssen (J&J) SARS-COV-2 Vaccination 05/24/2020, 10/03/2021   Pneumococcal Conjugate-13 07/11/2014   Pneumococcal Polysaccharide-23 12/23/2001   Td 08/06/2004   Tdap 06/05/2015   Zoster Recombinat (Shingrix) 08/03/2021, 01/02/2022   Zoster, Live 04/16/2007    TDAP status: Up to date  Flu Vaccine status: Up to date  Pneumococcal vaccine status: Up to date  Covid-19 vaccine status: Information provided on how to obtain vaccines.   Qualifies  for Shingles Vaccine? Yes   Zostavax completed No   Shingrix Completed?: Yes  Screening Tests Health Maintenance  Topic Date Due   Medicare Annual Wellness (AWV)  01/22/2023   INFLUENZA VACCINE  07/24/2023   DTaP/Tdap/Td (3 - Td or Tdap) 06/04/2025   Pneumonia Vaccine 69+ Years old  Completed   DEXA SCAN  Completed   Zoster Vaccines- Shingrix  Completed   HPV VACCINES  Aged Out   COVID-19 Vaccine  Discontinued    Health Maintenance  Health Maintenance Due  Topic Date Due   Medicare Annual Wellness (AWV)  01/22/2023    Colorectal cancer screening: No longer required.   Mammogram status: No longer required due to age.  Bone Density status: Completed 01/13/2019. Results reflect: Bone density results: OSTEOPENIA.   Lung Cancer Screening: (Low Dose CT Chest recommended if Age 4-80 years, 30 pack-year currently smoking OR have quit w/in 15years.) does not qualify.    Additional Screening:  Hepatitis C Screening: does not qualify  Vision Screening: Recommended annual ophthalmology exams for early detection of glaucoma and other disorders of the eye. Is  the patient up to date with their annual eye exam?  Yes  Who is the provider or what is the name of the office in which the patient attends annual eye exams? Dr.Groat If pt is not established with a provider, would they like to be referred to a provider to establish care? No .   Dental Screening: Recommended annual dental exams for proper oral hygiene  Community Resource Referral / Chronic Care Management: CRR required this visit?  No   CCM required this visit?  No      Plan:     I have personally reviewed and noted the following in the patient's chart:   Medical and social history Use of alcohol, tobacco or illicit drugs  Current medications and supplements including opioid prescriptions. Patient is not currently taking opioid prescriptions. Functional ability and status Nutritional status Physical  activity Advanced directives List of other physicians Hospitalizations, surgeries, and ER visits in previous 12 months Vitals Screenings to include cognitive, depression, and falls Referrals and appointments  In addition, I have reviewed and discussed with patient certain preventive protocols, quality metrics, and best practice recommendations. A written personalized care plan for preventive services as well as general preventive health recommendations were provided to patient.     Milus Banister, MD   05/26/2023

## 2023-05-28 ENCOUNTER — Telehealth (INDEPENDENT_AMBULATORY_CARE_PROVIDER_SITE_OTHER): Payer: Self-pay

## 2023-05-28 ENCOUNTER — Other Ambulatory Visit (INDEPENDENT_AMBULATORY_CARE_PROVIDER_SITE_OTHER): Payer: Self-pay | Admitting: Gastroenterology

## 2023-05-28 MED ORDER — LINACLOTIDE 145 MCG PO CAPS: 145.0000 ug | ORAL_CAPSULE | Freq: Every day | ORAL | 2 refills | Status: DC

## 2023-05-28 MED ORDER — GOLYTELY 236 G PO SOLR: 4.0000 L | Freq: Once | ORAL | 0 refills | Status: AC

## 2023-05-28 NOTE — Telephone Encounter (Signed)
Noted thanks °

## 2023-05-28 NOTE — Telephone Encounter (Signed)
Patient called today says she know she has an appointment to have her Tcs on 07/08/2023, but she does not know how much more of this she can take. She says she has not had a decent bm in over a month. Patient says she has to force the stool out and she almost has to lay down on the toilet to get anything to come out. She says she does take Miralax bid and some times when she takes the Miralax she has nothing but water come out. I advised it was ok if it was water coming out that that was good that at least something was coming out. Patient was concerned that she did not see any solids come out after eating. Patient does not use any other anti constipation medication other than the Miralax bid. Patient denies any fever, she does say she has some abdominal tenderness. Patient uses [facility]. Please advise.

## 2023-05-28 NOTE — Telephone Encounter (Signed)
Yes, please send in the prep and the Lin[place] 145 to [facility] pharmacy please. Patient is willing to try.

## 2023-06-11 ENCOUNTER — Ambulatory Visit (INDEPENDENT_AMBULATORY_CARE_PROVIDER_SITE_OTHER): Payer: PPO | Admitting: Family Medicine

## 2023-06-11 ENCOUNTER — Encounter: Payer: Self-pay | Admitting: Family Medicine

## 2023-06-11 VITALS — BP 122/77 | HR 75 | Ht 65.0 in | Wt 125.0 lb

## 2023-06-11 DIAGNOSIS — S8992XD Unspecified injury of left lower leg, subsequent encounter: Secondary | ICD-10-CM | POA: Diagnosis not present

## 2023-06-11 DIAGNOSIS — R252 Cramp and spasm: Secondary | ICD-10-CM | POA: Diagnosis not present

## 2023-06-11 DIAGNOSIS — E782 Mixed hyperlipidemia: Secondary | ICD-10-CM | POA: Diagnosis not present

## 2023-06-11 DIAGNOSIS — G3184 Mild cognitive impairment, so stated: Secondary | ICD-10-CM

## 2023-06-11 DIAGNOSIS — I10 Essential (primary) hypertension: Secondary | ICD-10-CM

## 2023-06-11 DIAGNOSIS — N1831 Chronic kidney disease, stage 3a: Secondary | ICD-10-CM | POA: Diagnosis not present

## 2023-06-11 NOTE — Patient Instructions (Addendum)
F/U in 4 months, call if you need me sooner  Chem 7 and EGFR today also calcium and magnesium   Continue medications that you are currently taking  You are referred to Dr Dallas Schimke re left knee tenderness and swelling following the injury  Thanks for choosing [facility], we consider it a privelige to serve you.

## 2023-06-11 NOTE — Assessment & Plan Note (Signed)
Ongoing pain and swelling on medial aspect, injury on 5/29, reduced mobility , refer Ortho

## 2023-06-12 LAB — BMP8+EGFR
BUN/Creatinine Ratio: 13 (ref 12–28)
BUN: 14 mg/dL (ref 8–27)
CO2: 28 mmol/L (ref 20–29)
Calcium: 9.9 mg/dL (ref 8.7–10.3)
Chloride: 100 mmol/L (ref 96–106)
Creatinine, Ser: 1.04 mg/dL — ABNORMAL HIGH (ref 0.57–1.00)
Glucose: 65 mg/dL — ABNORMAL LOW (ref 70–99)
Potassium: 4.3 mmol/L (ref 3.5–5.2)
Sodium: 140 mmol/L (ref 134–144)
eGFR: 52 mL/min/{1.73_m2} — ABNORMAL LOW (ref >59)

## 2023-06-12 LAB — MAGNESIUM: Magnesium: 2.1 mg/dL (ref 1.6–2.3)

## 2023-06-16 NOTE — Assessment & Plan Note (Signed)
Reports tolerance with significant GI distress. Recommend continue current med and intentionally address socialization as well as keeping up with envisronment an being diligent in getting orde and discipline into her routine Free pass to andencouraged exercise  regularly as tolerated She is very hapy to be on this med and will continue

## 2023-06-16 NOTE — Assessment & Plan Note (Signed)
Hyperlipidemia:Low fat diet discussed and encouraged.   Lipid Panel  Lab Results  Component Value Date   CHOL 288 (H) 01/21/2023   HDL 102 01/21/2023   LDLCALC 168 (H) 01/21/2023   TRIG 106 01/21/2023   CHOLHDL 2.8 01/21/2023     Improved though stil not at [facility], dietary management at this time

## 2023-06-16 NOTE — Assessment & Plan Note (Signed)
DASH diet and commitment to daily physical activity for a minimum of 30 minutes discussed and encouraged, as a part of hypertension management. The importance of attaining a healthy weight is also discussed.     06/11/2023    1:03 PM 05/26/2023   10:23 AM 05/21/2023    8:12 AM 05/02/2023    9:58 AM 04/23/2023    1:09 PM 04/22/2023    9:41 AM 04/21/2023   10:21 AM  BP/Weight  Systolic BP 122 148 164 134 110 126 118  Diastolic BP 77 85 90 76 67 79 62  Wt. (Lbs) 125 123.04   128.04 127.9 127.5  BMI 20.8 kg/m2 20.47 kg/m2   21.31 kg/m2 21.28 kg/m2 21.22 kg/m2     Controlled, no change in medication

## 2023-06-16 NOTE — Progress Notes (Signed)
Kelsey Nelson     MRN: [id number]      DOB: 05-Nov-1934  Chief Complaint  Patient presents with   Follow-up    Follow up    HPI Kelsey Nelson is here for follow up and re-evaluation of chronic medical conditions, medication management and review of any available recent lab and radiology data.  Preventive health is updated, specifically  Cancer screening and Immunization.   Questions or concerns regarding consultations or procedures which Kelsey Nelson has had in Kelsey interim are  addressed. Kelsey Nelson denies any adverse reactions to current medications since Kelsey last visit.  C/o left knee tenderness an swelling , unable to walk for exercise as she generally dosedaily There are no specific complaints   ROS Denies recent fever or chills. Denies sinus pressure, nasal congestion, ear pain or sore throat. Denies chest congestion, productive cough or wheezing. Denies chest pains, palpitations and leg swelling Denies abdominal pain, nausea, vomiting,diarrhea or constipation.   Denies dysuria, frequency, hesitancy or incontinence. Denies joint pain, swelling and limitation in mobility. Denies headaches, seizures, numbness, or tingling. Denies depression, anxiety or insomnia. Denies skin break down or rash.   PE  BP 122/77 (BP Location: Right Arm, Patient Position: Sitting, Cuff Size: Normal)   Pulse 75   Ht 5\' 5"  (1.651 m)   Wt 125 lb (56.7 kg)   SpO2 96%   BMI 20.80 kg/m   Patient alert and oriented and in no cardiopulmonary distress.  HEENT: No facial asymmetry, EOMI,     Neck supple .  Chest: Clear to auscultation bilaterally.  CVS: S1, S2 no murmurs, no S3.Regular rate.  ABD: Soft non tender.   Ext: No edema  MS: Adequate ROM spine, shoulders, hips and markedly decreased in left knee   Skin: Intact, no ulcerations or rash noted.  Psych: Good eye contact, normal affect. Memory intact not anxious or depressed appearing.  CNS: CN 2-12 intact, power,  normal throughout.no focal  deficits noted.   Assessment & Plan  Injury of left knee Ongoing pain and swelling on medial aspect, injury on 5/29, reduced mobility , refer Ortho  MCI (mild cognitive impairment) Reports tolerance with significant GI distress. Recommend continue current med and intentionally address socialization as well as keeping up with envisronment an being diligent in getting orde and discipline into her routine Free pass to andencouraged exercise  regularly as tolerated She is very hapy to be on this med and will continue  HTN (hypertension) DASH diet and commitment to daily physical activity for a minimum of 30 minutes discussed and encouraged, as a part of hypertension management. Kelsey importance of attaining a healthy weight is also discussed.     06/11/2023    1:03 PM 05/26/2023   10:23 AM 05/21/2023    8:12 AM 05/02/2023    9:58 AM 04/23/2023    1:09 PM 04/22/2023    9:41 AM 04/21/2023   10:21 AM  BP/Weight  Systolic BP 122 148 164 134 110 126 118  Diastolic BP 77 85 90 76 67 79 62  Wt. (Lbs) 125 123.04   128.04 127.9 127.5  BMI 20.8 kg/m2 20.47 kg/m2   21.31 kg/m2 21.28 kg/m2 21.22 kg/m2     Controlled, no change in medication   Mixed hyperlipidemia Hyperlipidemia:Low fat diet discussed and encouraged.   Lipid Panel  Lab Results  Component Value Date   CHOL 288 (H) 01/21/2023   HDL 102 01/21/2023   LDLCALC 168 (H) 01/21/2023   TRIG 106  01/21/2023   CHOLHDL 2.8 01/21/2023     Improved though stil not at [facility], dietary management at this time

## 2023-06-18 ENCOUNTER — Encounter: Payer: Self-pay | Admitting: Family Medicine

## 2023-06-23 ENCOUNTER — Ambulatory Visit (INDEPENDENT_AMBULATORY_CARE_PROVIDER_SITE_OTHER): Payer: PPO

## 2023-06-23 ENCOUNTER — Ambulatory Visit (INDEPENDENT_AMBULATORY_CARE_PROVIDER_SITE_OTHER): Payer: PPO | Admitting: Gastroenterology

## 2023-06-23 VITALS — BP 119/78 | HR 71 | Temp 97.9°F | Ht 65.0 in | Wt 124.3 lb

## 2023-06-23 DIAGNOSIS — K5904 Chronic idiopathic constipation: Secondary | ICD-10-CM | POA: Diagnosis not present

## 2023-06-23 DIAGNOSIS — L501 Idiopathic urticaria: Secondary | ICD-10-CM | POA: Diagnosis not present

## 2023-06-23 DIAGNOSIS — K219 Gastro-esophageal reflux disease without esophagitis: Secondary | ICD-10-CM

## 2023-06-23 NOTE — H&P (View-Only) (Signed)
 Referring Provider: Kerri Perches, MD Primary Care Physician:  Kerri Perches, MD Primary GI Physician: Levon Hedger   Chief Complaint  Patient presents with   Constipation    Follow up on constipation. States she has constipation off and on. Takes a stool softner most days and eats 5 -6 prunes per day. Has BM once daily. Sometimes she does have lower abdominal pain and a lot of gas.    Gastroesophageal Reflux    Follow up on GERD. Reports she is currently not taking omeprazole or pepcid that is on her med list. States she is not having any trouble with reflux.    HPI:   Kelsey Nelson is a 89 y.o. female with past medical history of GERD, hypertension, hyperlipidemia   Patient presenting today for follow up of constipation and GERD.  Last seen April 2024, at that time went about 4-5 days without a BM and then started a laxative, now having liquid stools, cannot even pass this if she does not take the laxative. some lower abdominal pain but notes she has a lot of bladder issues so she is unsure if pain is from her bladder or bowels.   Recommended abdominal xray, bowel prep if constipation present, consider linzess daily thereafter.  Xray on 4/30 with questionable enteritis, patient reported no diarrhea since her visit, having constipation. Advised to start miralax, stool studies if diarrhea recurs/colonoscopy if not improving   Present: Patient is feeling much better, she is doing 5-6 prunes per day and a stool softener at night. She is having 1-2 BMs per day. Not having as much tenesmus as previously. She has some abdominal tenderness that comes and goes around umbilicus and down to lower abdomen. She cannot pinpoint any specific precipitating or alleviating factors, states symptoms present for several months. No nausea or vomiting. Weight is stable, appetite is the same. Denies rectal bleeding or melena  GERD is doing well, she is only taking pepcid on PRN basis maybe 3-4  times per month with good control of her symptoms. Denies dysphagia or odynophagia.   Last Colonoscopy:2014 Examination performed to cecum. Small polyps ablated via cold biopsy from proximal transverse colon. Multiple diverticula at sigmoid colon. Small external hemorrhoids. Last Endoscopy: never  Past Medical History:  Diagnosis Date   Acute cystitis with hematuria 10/26/2013   Allergic rhinitis 11/08/2017   Blindness of right eye    BMI between 19-24,adult 2010 145 LBS   Diverticula of small intestine Dec 2010   Diverticulosis    [employer]   DIVERTICULOSIS, SIGMOID COLON 03/31/2008   Qualifier: Diagnosis of  By: Lind Guest  Noted on abdominal and pelvic scan in 04/2011.  Extensive colonic diverticulosis , and possible muscular hypertrophy at rectosigmoid junction    Family hx of colon cancer 11/09/2013   Functional constipation 2010   GERD (gastroesophageal reflux disease)    HTN (hypertension)    Hyperlipemia    Pancreatic cyst 02/05/2013   Renal cyst 05/27/2011   Right eye trauma 1949   Loss of vision resulted at age 61   Schatzki's ring Dec 2010   Skin lesions, generalized 06/19/2015   Tubular adenoma 11/09/2013    Past Surgical History:  Procedure Laterality Date   ABDOMINAL HYSTERECTOMY     BREAST REDUCTION SURGERY  1992 BIL   COLONOSCOPY  DEC 2010   SIMPLE ADENOMAS (6-8), [place] TICS, SML IH   COLONOSCOPY N/A 12/08/2013   Procedure: COLONOSCOPY;  Surgeon: Malissa Hippo, MD;  Location: [facility];  Service: Endoscopy;  Laterality: N/A;  225   EVISCERATION Right 05/08/2015   Procedure: EVISCERATION WITH IMPLANT RIGHT EYE;  Surgeon: Susa Simmonds, MD;  Location: [facility];  Service: Ophthalmology;  Laterality: Right;   EYE SURGERY  RIGHT 2005   EYE SURGERY  right 2016   removed and false eye placed in 08/2015   TOTAL ABDOMINAL HYSTERECTOMY W/ BILATERAL SALPINGOOPHORECTOMY  1985 FIBROIDS   UPPER GASTROINTESTINAL ENDOSCOPY  DEC 2010   Bx-MILD GASTRITIS/DUODENITIS, DUODENAL  DIVERTICULA, SCHATZKI'S RING    Current Outpatient Medications  Medication Sig Dispense Refill   Calcium Carbonate Antacid (TUMS PO) Take 1 tablet by mouth 3 (three) times daily as needed.     Cholecalciferol (VITAMIN D3 PO) Take 25 mcg by mouth daily.     conjugated estrogens (PREMARIN) vaginal cream Place 1 Applicatorful vaginally every other day. 42.5 g 5   diphenhydrAMINE (BENADRYL ALLERGY) 25 mg capsule Take 1 capsule (25 mg total) by mouth every 6 (six) hours as needed. 30 capsule 0   EPINEPHrine (EPIPEN 2-PAK) 0.3 mg/0.3 mL IJ SOAJ injection Inject 0.3 mg into the muscle as needed for anaphylaxis. 2 each 1   fexofenadine (ALLEGRA ALLERGY) 180 MG tablet Take 1 tablet (180 mg total) by mouth every morning. 30 tablet 5   fluticasone (FLONASE) 50 MCG/ACT nasal spray Place 1 spray into both nostrils daily. 16 g 5   Multiple Vitamin (MULTIVITAMIN PO) Take by mouth.     OVER THE COUNTER MEDICATION Multi Collagen Protein - Patient states that she takes 1 scoop a day.     OVER THE COUNTER MEDICATION One daily hylands leg cramp relief     OVER THE COUNTER MEDICATION Omega xl joint and muscle support daily     Probiotic Product (PROBIOTIC DAILY PO) Take 1 tablet by mouth daily. As needed     ramipril (ALTACE) 10 MG capsule Take one capsule by mouth once daily for blood pressure 90 capsule 3   XOLAIR 150 MG/ML prefilled syringe Inject into the skin.     donepezil (ARICEPT) 10 MG tablet Take 1 tablet (10 mg total) by mouth at bedtime. (Patient not taking: Reported on 06/23/2023) 30 tablet 3   famotidine (PEPCID) 20 MG tablet Take 1 tablet (20 mg total) by mouth 2 (two) times daily. (Patient not taking: Reported on 06/23/2023) 60 tablet 3   linaclotide (LINZESS) 145 MCG CAPS capsule Take 1 capsule (145 mcg total) by mouth daily. (Patient not taking: Reported on 06/23/2023) 30 capsule 2   methenamine (HIPREX) 1 g tablet Take 1 tablet (1 g total) by mouth at bedtime. (Patient not taking: Reported on 06/23/2023)  90 tablet 3   omeprazole (PRILOSEC) 20 MG capsule Take 1 capsule (20 mg total) by mouth daily. (Patient not taking: Reported on 06/23/2023) 30 capsule 0   polyethylene glycol powder (GLYCOLAX/MIRALAX) 17 GM/SCOOP powder Take 17 g by mouth daily as needed. (Patient not taking: Reported on 06/23/2023) 3350 g 1   Current Facility-Administered Medications  Medication Dose Route Frequency Provider Last Rate Last Admin   omalizumab Geoffry Paradise) prefilled syringe 300 mg  300 mg Subcutaneous Q28 days Alfonse Spruce, MD   300 mg at 05/26/23 1423    Allergies as of 06/23/2023 - Review Complete 06/23/2023  Allergen Reaction Noted   Codeine  07/22/2015   Crestor [rosuvastatin] Other (See Comments) 10/26/2021   Fosamax [alendronate sodium]  12/31/2013    Family History  Problem Relation Age of Onset   Pancreatic cancer Sister  Colon cancer Paternal Aunt    Colon polyps Neg Hx     Social History   Socioeconomic History   Marital status: Married    Spouse name: Onalee Hua    Number of children: 3   Years of education: 12   Highest education level: 12th grade  Occupational History   Occupation: retired   Tobacco Use   Smoking status: Never    Passive exposure: Past   Smokeless tobacco: Never  Vaping Use   Vaping Use: Never used  Substance and Sexual Activity   Alcohol use: No    Alcohol/week: 0.0 standard drinks of alcohol   Drug use: No   Sexual activity: Not Currently  Other Topics Concern   Not on file  Social History Narrative   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/   Social Determinants of Health   Financial Resource Strain: Low Risk  (01/22/2022)   Overall Financial Resource Strain (CARDIA)    Difficulty of Paying Living Expenses: Not hard at all  Food Insecurity: No Food Insecurity (01/22/2022)   Hunger Vital Sign    Worried About Running Out of Food in the Last Year: Never true    Ran Out of Food in the Last Year: Never true  Transportation Needs: No  Transportation Needs (01/22/2022)   PRAPARE - Administrator, Civil Service (Medical): No    Lack of Transportation (Non-Medical): No  Physical Activity: Sufficiently Active (01/22/2022)   Exercise Vital Sign    Days of Exercise per Week: 5 days    Minutes of Exercise per Session: 60 min  Stress: No Stress Concern Present (01/22/2022)   [employer] of Occupational Health - Occupational Stress Questionnaire    Feeling of Stress : Not at all  Social Connections: Socially Integrated (01/22/2022)   Social Connection and Isolation Panel [NHANES]    Frequency of Communication with Friends and Family: Never    Frequency of Social Gatherings with Friends and Family: More than three times a week    Attends Religious Services: More than 4 times per year    Active Member of [employer] or Organizations: Yes    Attends Engineer, structural: More than 4 times per year    Marital Status: Married   Review of systems General: negative for malaise, night sweats, fever, chills, weight loss Neck: Negative for lumps, goiter, pain and significant neck swelling Resp: Negative for cough, wheezing, dyspnea at rest CV: Negative for chest pain, leg swelling, palpitations, orthopnea GI: denies melena, hematochezia, nausea, vomiting, diarrhea, constipation, dysphagia, odyonophagia, early satiety or unintentional weight loss. +abdominal tenderness MSK: Negative for joint pain or swelling, back pain, and muscle pain. Derm: Negative for itching or rash Psych: Denies depression, anxiety, memory loss, confusion. No homicidal or suicidal ideation.  Heme: Negative for prolonged bleeding, bruising easily, and swollen nodes. Endocrine: Negative for cold or heat intolerance, polyuria, polydipsia and goiter. Neuro: negative for tremor, gait imbalance, syncope and seizures. The remainder of the review of systems is noncontributory.  Physical Exam: There were no vitals taken for this visit. General:    Alert and oriented. No distress noted. Pleasant and cooperative.  Head:  Normocephalic and atraumatic. Eyes:  Conjuctiva clear without scleral icterus. Mouth:  Oral mucosa pink and moist. Good dentition. No lesions. Heart: Normal rate and rhythm, s1 and s2 heart sounds present.  Lungs: Clear lung sounds in all lobes. Respirations equal and unlabored. Abdomen:  +BS, soft, non-tender and non-distended. No rebound or guarding. No  HSM or masses noted. Derm: No palmar erythema or jaundice Msk:  Symmetrical without gross deformities. Normal posture. Extremities:  Without edema. Neurologic:  Alert and  oriented x4 Psych:  Alert and cooperative. Normal mood and affect.  Invalid input(s): "6 MONTHS"   ASSESSMENT: Kelsey Nelson is a 89 y.o. female presenting today for follow up of constipation and GERD  Constipation: previously with tenesmus and liquid stools only when taking laxative, abdominal xray concerning for enteritis in April. She is doing prunes and stool softener daily and having 1-2 BMs per day with decreased tenesmus, no further diarrhea. She has upcoming colonoscopy on 7/16. Advised to continue with current regimen as this seems to be working well for her.   GERD: only using Pepcid PRN a few times per month with good results. Denies dysphagia or odynophagia. Will continue with current regimen, she can let me know if symptoms worsen.    PLAN:  Continue with prunes 2. Continue stool softener at bedtime  3. Good water intake 4. Pepcid PRN for GERD symptoms 5. Keep scheduled colonoscopy   All questions were answered, patient verbalized understanding and is in agreement with plan as outlined above.    Follow Up: 6 months   Chelsea L. Jeanmarie Hubert, MSN, APRN, AGNP-C Adult-Gerontology Nurse Practitioner [facility]  I have reviewed the note and agree with the APP's assessment as described in this progress note  [name], MD Gastroenterology and  Hepatology [facility]enter[facility]

## 2023-06-23 NOTE — Progress Notes (Addendum)
Referring Provider: Kerri Perches, MD Primary Care Physician:  Kerri Perches, MD Primary GI Physician: Levon Hedger   Chief Complaint  Patient presents with   Constipation    Follow up on constipation. States she has constipation off and on. Takes a stool softner most days and eats 5 -6 prunes per day. Has BM once daily. Sometimes she does have lower abdominal pain and a lot of gas.    Gastroesophageal Reflux    Follow up on GERD. Reports she is currently not taking omeprazole or pepcid that is on her med list. States she is not having any trouble with reflux.    HPI:   Kelsey Nelson is a 89 y.o. female with past medical history of GERD, hypertension, hyperlipidemia   Patient presenting today for follow up of constipation and GERD.  Last seen April 2024, at that time went about 4-5 days without a BM and then started a laxative, now having liquid stools, cannot even pass this if she does not take the laxative. some lower abdominal pain but notes she has a lot of bladder issues so she is unsure if pain is from her bladder or bowels.   Recommended abdominal xray, bowel prep if constipation present, consider linzess daily thereafter.  Xray on 4/30 with questionable enteritis, patient reported no diarrhea since her visit, having constipation. Advised to start miralax, stool studies if diarrhea recurs/colonoscopy if not improving   Present: Patient is feeling much better, she is doing 5-6 prunes per day and a stool softener at night. She is having 1-2 BMs per day. Not having as much tenesmus as previously. She has some abdominal tenderness that comes and goes around umbilicus and down to lower abdomen. She cannot pinpoint any specific precipitating or alleviating factors, states symptoms present for several months. No nausea or vomiting. Weight is stable, appetite is the same. Denies rectal bleeding or melena  GERD is doing well, she is only taking pepcid on PRN basis maybe 3-4  times per month with good control of her symptoms. Denies dysphagia or odynophagia.   Last Colonoscopy:2014 Examination performed to cecum. Small polyps ablated via cold biopsy from proximal transverse colon. Multiple diverticula at sigmoid colon. Small external hemorrhoids. Last Endoscopy: never  Past Medical History:  Diagnosis Date   Acute cystitis with hematuria 10/26/2013   Allergic rhinitis 11/08/2017   Blindness of right eye    BMI between 19-24,adult 2010 145 LBS   Diverticula of small intestine Dec 2010   Diverticulosis    [employer]   DIVERTICULOSIS, SIGMOID COLON 03/31/2008   Qualifier: Diagnosis of  By: Lind Guest  Noted on abdominal and pelvic scan in 04/2011.  Extensive colonic diverticulosis , and possible muscular hypertrophy at rectosigmoid junction    Family hx of colon cancer 11/09/2013   Functional constipation 2010   GERD (gastroesophageal reflux disease)    HTN (hypertension)    Hyperlipemia    Pancreatic cyst 02/05/2013   Renal cyst 05/27/2011   Right eye trauma 1949   Loss of vision resulted at age 61   Schatzki's ring Dec 2010   Skin lesions, generalized 06/19/2015   Tubular adenoma 11/09/2013    Past Surgical History:  Procedure Laterality Date   ABDOMINAL HYSTERECTOMY     BREAST REDUCTION SURGERY  1992 BIL   COLONOSCOPY  DEC 2010   SIMPLE ADENOMAS (6-8), [place] TICS, SML IH   COLONOSCOPY N/A 12/08/2013   Procedure: COLONOSCOPY;  Surgeon: Malissa Hippo, MD;  Location: [facility]  END[facility];  Service: Endoscopy;  Laterality: N/A;  225   EVISCERATION Right 05/08/2015   Procedure: EVISCERATION WITH IMPLANT RIGHT EYE;  Surgeon: Susa Simmonds, MD;  Location: [facility]S;  Service: Ophthalmology;  Laterality: Right;   EYE SURGERY  RIGHT 2005   EYE SURGERY  right 2016   removed and false eye placed in 08/2015   TOTAL ABDOMINAL HYSTERECTOMY W/ BILATERAL SALPINGOOPHORECTOMY  1985 FIBROIDS   UPPER GASTROINTESTINAL ENDOSCOPY  DEC 2010   Bx-MILD GASTRITIS/DUODENITIS, DUODENAL  DIVERTICULA, SCHATZKI'S RING    Current Outpatient Medications  Medication Sig Dispense Refill   Calcium Carbonate Antacid (TUMS PO) Take 1 tablet by mouth 3 (three) times daily as needed.     Cholecalciferol (VITAMIN D3 PO) Take 25 mcg by mouth daily.     conjugated estrogens (PREMARIN) vaginal cream Place 1 Applicatorful vaginally every other day. 42.5 g 5   diphenhydrAMINE (BENADRYL ALLERGY) 25 mg capsule Take 1 capsule (25 mg total) by mouth every 6 (six) hours as needed. 30 capsule 0   EPINEPHrine (EPIPEN 2-PAK) 0.3 mg/0.3 mL IJ SOAJ injection Inject 0.3 mg into the muscle as needed for anaphylaxis. 2 each 1   fexofenadine (ALLEGRA ALLERGY) 180 MG tablet Take 1 tablet (180 mg total) by mouth every morning. 30 tablet 5   fluticasone (FLONASE) 50 MCG/ACT nasal spray Place 1 spray into both nostrils daily. 16 g 5   Multiple Vitamin (MULTIVITAMIN PO) Take by mouth.     OVER THE COUNTER MEDICATION Multi Collagen Protein - Patient states that she takes 1 scoop a day.     OVER THE COUNTER MEDICATION One daily hylands leg cramp relief     OVER THE COUNTER MEDICATION Omega xl joint and muscle support daily     Probiotic Product (PROBIOTIC DAILY PO) Take 1 tablet by mouth daily. As needed     ramipril (ALTACE) 10 MG capsule Take one capsule by mouth once daily for blood pressure 90 capsule 3   XOLAIR 150 MG/ML prefilled syringe Inject into the skin.     donepezil (ARICEPT) 10 MG tablet Take 1 tablet (10 mg total) by mouth at bedtime. (Patient not taking: Reported on 06/23/2023) 30 tablet 3   famotidine (PEPCID) 20 MG tablet Take 1 tablet (20 mg total) by mouth 2 (two) times daily. (Patient not taking: Reported on 06/23/2023) 60 tablet 3   linaclotide (LINZESS) 145 MCG CAPS capsule Take 1 capsule (145 mcg total) by mouth daily. (Patient not taking: Reported on 06/23/2023) 30 capsule 2   methenamine (HIPREX) 1 g tablet Take 1 tablet (1 g total) by mouth at bedtime. (Patient not taking: Reported on 06/23/2023)  90 tablet 3   omeprazole (PRILOSEC) 20 MG capsule Take 1 capsule (20 mg total) by mouth daily. (Patient not taking: Reported on 06/23/2023) 30 capsule 0   polyethylene glycol powder (GLYCOLAX/MIRALAX) 17 GM/SCOOP powder Take 17 g by mouth daily as needed. (Patient not taking: Reported on 06/23/2023) 3350 g 1   Current Facility-Administered Medications  Medication Dose Route Frequency Provider Last Rate Last Admin   omalizumab Geoffry Paradise) prefilled syringe 300 mg  300 mg Subcutaneous Q28 days Alfonse Spruce, MD   300 mg at 05/26/23 1423    Allergies as of 06/23/2023 - Review Complete 06/23/2023  Allergen Reaction Noted   Codeine  07/22/2015   Crestor [rosuvastatin] Other (See Comments) 10/26/2021   Fosamax [alendronate sodium]  12/31/2013    Family History  Problem Relation Age of Onset   Pancreatic cancer Sister  Colon cancer Paternal Aunt    Colon polyps Neg Hx     Social History   Socioeconomic History   Marital status: Married    Spouse name: Onalee Hua    Number of children: 3   Years of education: 12   Highest education level: 12th grade  Occupational History   Occupation: retired   Tobacco Use   Smoking status: Never    Passive exposure: Past   Smokeless tobacco: Never  Vaping Use   Vaping Use: Never used  Substance and Sexual Activity   Alcohol use: No    Alcohol/week: 0.0 standard drinks of alcohol   Drug use: No   Sexual activity: Not Currently  Other Topics Concern   Not on file  Social History Narrative   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/   Social Determinants of Health   Financial Resource Strain: Low Risk  (01/22/2022)   Overall Financial Resource Strain (CARDIA)    Difficulty of Paying Living Expenses: Not hard at all  Food Insecurity: No Food Insecurity (01/22/2022)   Hunger Vital Sign    Worried About Running Out of Food in the Last Year: Never true    Ran Out of Food in the Last Year: Never true  Transportation Needs: No  Transportation Needs (01/22/2022)   PRAPARE - Administrator, Civil Service (Medical): No    Lack of Transportation (Non-Medical): No  Physical Activity: Sufficiently Active (01/22/2022)   Exercise Vital Sign    Days of Exercise per Week: 5 days    Minutes of Exercise per Session: 60 min  Stress: No Stress Concern Present (01/22/2022)   [employer] of Occupational Health - Occupational Stress Questionnaire    Feeling of Stress : Not at all  Social Connections: Socially Integrated (01/22/2022)   Social Connection and Isolation Panel [NHANES]    Frequency of Communication with Friends and Family: Never    Frequency of Social Gatherings with Friends and Family: More than three times a week    Attends Religious Services: More than 4 times per year    Active Member of [employer] or Organizations: Yes    Attends Engineer, structural: More than 4 times per year    Marital Status: Married   Review of systems General: negative for malaise, night sweats, fever, chills, weight loss Neck: Negative for lumps, goiter, pain and significant neck swelling Resp: Negative for cough, wheezing, dyspnea at rest CV: Negative for chest pain, leg swelling, palpitations, orthopnea GI: denies melena, hematochezia, nausea, vomiting, diarrhea, constipation, dysphagia, odyonophagia, early satiety or unintentional weight loss. +abdominal tenderness MSK: Negative for joint pain or swelling, back pain, and muscle pain. Derm: Negative for itching or rash Psych: Denies depression, anxiety, memory loss, confusion. No homicidal or suicidal ideation.  Heme: Negative for prolonged bleeding, bruising easily, and swollen nodes. Endocrine: Negative for cold or heat intolerance, polyuria, polydipsia and goiter. Neuro: negative for tremor, gait imbalance, syncope and seizures. The remainder of the review of systems is noncontributory.  Physical Exam: There were no vitals taken for this visit. General:    Alert and oriented. No distress noted. Pleasant and cooperative.  Head:  Normocephalic and atraumatic. Eyes:  Conjuctiva clear without scleral icterus. Mouth:  Oral mucosa pink and moist. Good dentition. No lesions. Heart: Normal rate and rhythm, s1 and s2 heart sounds present.  Lungs: Clear lung sounds in all lobes. Respirations equal and unlabored. Abdomen:  +BS, soft, non-tender and non-distended. No rebound or guarding. No  HSM or masses noted. Derm: No palmar erythema or jaundice Msk:  Symmetrical without gross deformities. Normal posture. Extremities:  Without edema. Neurologic:  Alert and  oriented x4 Psych:  Alert and cooperative. Normal mood and affect.  Invalid input(s): "6 MONTHS"   ASSESSMENT: Kelsey Nelson is a 89 y.o. female presenting today for follow up of constipation and GERD  Constipation: previously with tenesmus and liquid stools only when taking laxative, abdominal xray concerning for enteritis in April. She is doing prunes and stool softener daily and having 1-2 BMs per day with decreased tenesmus, no further diarrhea. She has upcoming colonoscopy on 7/16. Advised to continue with current regimen as this seems to be working well for her.   GERD: only using Pepcid PRN a few times per month with good results. Denies dysphagia or odynophagia. Will continue with current regimen, she can let me know if symptoms worsen.    PLAN:  Continue with prunes 2. Continue stool softener at bedtime  3. Good water intake 4. Pepcid PRN for GERD symptoms 5. Keep scheduled colonoscopy   All questions were answered, patient verbalized understanding and is in agreement with plan as outlined above.    Follow Up: 6 months   Piero Mustard L. Jeanmarie Hubert, MSN, APRN, AGNP-C Adult-Gerontology Nurse Practitioner [facility]  I have reviewed the note and agree with the APP's assessment as described in this progress note  [name], MD Gastroenterology and  Hepatology [facility]astroenterology

## 2023-06-23 NOTE — Patient Instructions (Signed)
Continue with prunes and stool softener Make sure you are drinking plenty of water Keep your appointment for scheduled colonoscopy   We will plan to see you back in 6 months, let me know if you have any new or worsening symptoms and I will be happy to see you sooner!  It was a pleasure to see you today. I want to create trusting relationships with patients and provide genuine, compassionate, and quality care. I truly value your feedback! please be on the lookout for a survey regarding your visit with me today. I appreciate your input about our visit and your time in completing this!    Chelby Salata L. Jeanmarie Hubert, MSN, APRN, AGNP-C Adult-Gerontology Nurse Practitioner [facility] at [facility]

## 2023-06-24 ENCOUNTER — Ambulatory Visit: Payer: PPO | Admitting: Orthopedic Surgery

## 2023-06-24 ENCOUNTER — Encounter: Payer: Self-pay | Admitting: Orthopedic Surgery

## 2023-06-24 VITALS — BP 129/76 | HR 77 | Ht 65.0 in | Wt 124.0 lb

## 2023-06-24 DIAGNOSIS — S8992XA Unspecified injury of left lower leg, initial encounter: Secondary | ICD-10-CM | POA: Diagnosis not present

## 2023-06-24 NOTE — Progress Notes (Signed)
New Patient Visit  Assessment: Kelsey Nelson is a 89 y.o. female with the following: 1. Injury of left knee, initial encounter  Plan: SYMIA DETEMPLE has medial left knee pain.  It is continue to improve.  Radiographs were previously obtained, and these were negative for acute injury.  She is now walking 2 miles per day.  I think this is excellent.  She should continue to increase her level activity as tolerated.  She can try over-the-counter medications, including topical treatments.  She can consider using the brace.  If she is not getting better, we can consider formal physical therapy.  We can also consider an intra-articular steroid injection.  She is in agreement this plan.  She will follow-up as needed.  Follow-up: Return if symptoms worsen or fail to improve.  Subjective:  Chief Complaint  Patient presents with   Knee Pain    L knee pain for approx 6 wks after pt braced a mans fall. Pt states the knee feels much better but was told she should still get it checked out.     History of Present Illness: Kelsey Nelson is a 89 y.o. female who has been referred by Syliva Overman, MD for evaluation of left knee pain.  She states that she was walking some friends, approximately 6 weeks ago, when one of the other people passed out.  He landed on her left knee.  She had pain at that time.  Radiographs were negative.  Since then, her pain is improving.  She does have some radiating cramping pains both proximally and distally.  She is not taking medicines on a regular basis.  Her pain continues to get better, and she is now walking 2 miles per day.  Previously, she was walking 3 miles per day.   Review of Systems: No fevers or chills No numbness or tingling No chest pain No shortness of breath No bowel or bladder dysfunction No GI distress No headaches   Medical History:  Past Medical History:  Diagnosis Date   Acute cystitis with hematuria 10/26/2013   Allergic rhinitis  11/08/2017   Blindness of right eye    BMI between 19-24,adult 2010 145 LBS   Diverticula of small intestine Dec 2010   Diverticulosis    [place]   DIVERTICULOSIS, SIGMOID COLON 03/31/2008   Qualifier: Diagnosis of  By: Lind Guest  Noted on abdominal and pelvic scan in 04/2011.  Extensive colonic diverticulosis , and possible muscular hypertrophy at rectosigmoid junction    Family hx of colon cancer 11/09/2013   Functional constipation 2010   GERD (gastroesophageal reflux disease)    HTN (hypertension)    Hyperlipemia    Pancreatic cyst 02/05/2013   Renal cyst 05/27/2011   Right eye trauma 1949   Loss of vision resulted at age 9   Schatzki's ring Dec 2010   Skin lesions, generalized 06/19/2015   Tubular adenoma 11/09/2013    Past Surgical History:  Procedure Laterality Date   ABDOMINAL HYSTERECTOMY     BREAST REDUCTION SURGERY  1992 BIL   COLONOSCOPY  DEC 2010   SIMPLE ADENOMAS (6-8), [place] TICS, SML IH   COLONOSCOPY N/A 12/08/2013   Procedure: COLONOSCOPY;  Surgeon: Malissa Hippo, MD;  Location: [facility];  Service: Endoscopy;  Laterality: N/A;  225   EVISCERATION Right 05/08/2015   Procedure: EVISCERATION WITH IMPLANT RIGHT EYE;  Surgeon: Susa Simmonds, MD;  Location: [facility] ORS;  Service: Ophthalmology;  Laterality: Right;   EYE SURGERY  RIGHT 2005   EYE SURGERY  right 2016   removed and false eye placed in 08/2015   TOTAL ABDOMINAL HYSTERECTOMY W/ BILATERAL SALPINGOOPHORECTOMY  1985 FIBROIDS   UPPER GASTROINTESTINAL ENDOSCOPY  DEC 2010   Bx-MILD GASTRITIS/DUODENITIS, DUODENAL DIVERTICULA, SCHATZKI'S RING    Family History  Problem Relation Age of Onset   Pancreatic cancer Sister    Colon cancer Paternal Aunt    Colon polyps Neg Hx    Social History   Tobacco Use   Smoking status: Never    Passive exposure: Past   Smokeless tobacco: Never  Vaping Use   Vaping Use: Never used  Substance Use Topics   Alcohol use: No    Alcohol/week: 0.0 standard drinks of alcohol    Drug use: No    Allergies  Allergen Reactions   Codeine    Crestor [Rosuvastatin] Other (See Comments)    Can't recall   Fosamax [Alendronate Sodium]     heartburn    No outpatient medications have been marked as taking for the 06/24/23 encounter (Office Visit) with Oliver Barre, MD.   Current Facility-Administered Medications for the 06/24/23 encounter (Office Visit) with Oliver Barre, MD  Medication   omalizumab Geoffry Paradise) prefilled syringe 300 mg    Objective: BP 129/76   Pulse 77   Ht 5\' 5"  (1.651 m)   Wt 124 lb (56.2 kg)   BMI 20.63 kg/m   Physical Exam:  General: Alert and oriented. and No acute distress. Gait: Normal gait.  Evaluation left knee demonstrates no bruising.  No swelling.  She has good range of motion.  She does have tenderness to palpation along the course of the MCL.  No increased laxity to valgus stress at full extension, as well as 30 degrees of flexion.  Negative Lachman.  She tolerates flexion of the knee beyond 110 degrees.  She can achieve full extension.  IMAGING: I personally reviewed images previously obtained in clinic  X-rays were previously obtained in clinic.  These are negative for any acute injury.   New Medications:  No orders of the defined types were placed in this encounter.     Oliver Barre, MD  06/24/2023 8:59 AM

## 2023-06-24 NOTE — Patient Instructions (Signed)

## 2023-07-02 NOTE — Patient Instructions (Addendum)
Kelsey Nelson  07/02/2023     @PREFPERIOPPHARMACY @   Your procedure is scheduled on  07/08/2023.   Report to [facility] at  [phone]  A.M.   Call this number if you have problems the morning of surgery:  [phone]  If you experience any cold or flu symptoms such as cough, fever, chills, shortness of breath, etc. between now and your scheduled surgery, please notify us at the above number.   Remember:  Follow the diet and prep instructions given to you by the office.    Take these medicines the morning of surgery with A SIP OF WATER                                           None.    Do not wear jewelry, make-up or nail polish, including gel polish,  artificial nails, or any other type of covering on natural nails (fingers and  toes).  Do not wear lotions, powders, or perfumes, or deodorant.  Do not shave 48 hours prior to surgery.  Men may shave face and neck.  Do not bring valuables to the hospital.  [facility] is not responsible for any belongings or valuables.  Contacts, dentures or bridgework may not be worn into surgery.  Leave your suitcase in the car.  After surgery it may be brought to your room.  For patients admitted to the hospital, discharge time will be determined by your treatment team.  Patients discharged the day of surgery will not be allowed to drive home and must have someone with them for 24 hours.    Special instructions:   DO NOT smoke tobacco or vape for 24 hours before your procedure.  Please read over the following fact sheets that you were given. Anesthesia Post-op Instructions and Care and Recovery After Surgery        Colonoscopy, Adult, Care After The following information offers guidance on how to care for yourself after your procedure. Your health care provider may also give you more specific instructions. If you have problems or questions, contact your health care provider. What can I expect after the procedure? After the  procedure, it is common to have: A small amount of blood in your stool for 24 hours after the procedure. Some gas. Mild cramping or bloating of your abdomen. Follow these instructions at home: Eating and drinking  Drink enough fluid to keep your urine pale yellow. Follow instructions from your health care provider about eating or drinking restrictions. Resume your normal diet as told by your health care provider. Avoid heavy or fried foods that are hard to digest. Activity Rest as told by your health care provider. Avoid sitting for a long time without moving. Get up to take short walks every 1-2 hours. This is important to improve blood flow and breathing. Ask for help if you feel weak or unsteady. Return to your normal activities as told by your health care provider. Ask your health care provider what activities are safe for you. Managing cramping and bloating  Try walking around when you have cramps or feel bloated. If directed, apply heat to your abdomen as told by your health care provider. Use the heat source that your health care provider recommends, such as a moist heat pack or a heating pad. Place a towel between your skin and the heat  source. Leave the heat on for 20-30 minutes. Remove the heat if your skin turns bright red. This is especially important if you are unable to feel pain, heat, or cold. You have a greater risk of getting burned. General instructions If you were given a sedative during the procedure, it can affect you for several hours. Do not drive or operate machinery until your health care provider says that it is safe. For the first 24 hours after the procedure: Do not sign important documents. Do not drink alcohol. Do your regular daily activities at a slower pace than normal. Eat soft foods that are easy to digest. Take over-the-counter and prescription medicines only as told by your health care provider. Keep all follow-up visits. This is important. Contact  a health care provider if: You have blood in your stool 2-3 days after the procedure. Get help right away if: You have more than a small spotting of blood in your stool. You have large blood clots in your stool. You have swelling of your abdomen. You have nausea or vomiting. You have a fever. You have increasing pain in your abdomen that is not relieved with medicine. These symptoms may be an emergency. Get help right away. Call 911. Do not wait to see if the symptoms will go away. Do not drive yourself to the hospital. Summary After the procedure, it is common to have a small amount of blood in your stool. You may also have mild cramping and bloating of your abdomen. If you were given a sedative during the procedure, it can affect you for several hours. Do not drive or operate machinery until your health care provider says that it is safe. Get help right away if you have a lot of blood in your stool, nausea or vomiting, a fever, or increased pain in your abdomen. This information is not intended to replace advice given to you by your health care provider. Make sure you discuss any questions you have with your health care provider. Document Revised: 01/21/2023 Document Reviewed: 08/01/2021 Elsevier Patient Education  2024 [employer] Monitored Anesthesia Care, Care After The following information offers guidance on how to care for yourself after your procedure. Your health care provider may also give you more specific instructions. If you have problems or questions, contact your health care provider. What can I expect after the procedure? After the procedure, it is common to have: Tiredness. Little or no memory about what happened during or after the procedure. Impaired judgment when it comes to making decisions. Nausea or vomiting. Some trouble with balance. Follow these instructions at home: For the time period you were told by your health care provider:  Rest. Do not participate  in activities where you could fall or become injured. Do not drive or use machinery. Do not drink alcohol. Do not take sleeping pills or medicines that cause drowsiness. Do not make important decisions or sign legal documents. Do not take care of children on your own. Medicines Take over-the-counter and prescription medicines only as told by your health care provider. If you were prescribed antibiotics, take them as told by your health care provider. Do not stop using the antibiotic even if you start to feel better. Eating and drinking Follow instructions from your health care provider about what you may eat and drink. Drink enough fluid to keep your urine pale yellow. If you vomit: Drink clear fluids slowly and in small amounts as you are able. Clear fluids include water, ice chips, low-calorie  sports drinks, and fruit juice that has water added to it (diluted fruit juice). Eat light and bland foods in small amounts as you are able. These foods include bananas, applesauce, rice, lean meats, toast, and crackers. General instructions  Have a responsible adult stay with you for the time you are told. It is important to have someone help care for you until you are awake and alert. If you have sleep apnea, surgery and some medicines can increase your risk for breathing problems. Follow instructions from your health care provider about wearing your sleep device: When you are sleeping. This includes during daytime naps. While taking prescription pain medicines, sleeping medicines, or medicines that make you drowsy. Do not use any products that contain nicotine or tobacco. These products include cigarettes, chewing tobacco, and vaping devices, such as e-cigarettes. If you need help quitting, ask your health care provider. Contact a health care provider if: You feel nauseous or vomit every time you eat or drink. You feel light-headed. You are still sleepy or having trouble with balance after 24  hours. You get a rash. You have a fever. You have redness or swelling around the IV site. Get help right away if: You have trouble breathing. You have new confusion after you get home. These symptoms may be an emergency. Get help right away. Call 911. Do not wait to see if the symptoms will go away. Do not drive yourself to the hospital. This information is not intended to replace advice given to you by your health care provider. Make sure you discuss any questions you have with your health care provider. Document Revised: 05/06/2022 Document Reviewed: 05/06/2022 Elsevier Patient Education  2024 [employer].

## 2023-07-03 ENCOUNTER — Encounter (HOSPITAL_COMMUNITY): Payer: Self-pay

## 2023-07-03 ENCOUNTER — Encounter (HOSPITAL_COMMUNITY)
Admission: RE | Admit: 2023-07-03 | Discharge: 2023-07-03 | Disposition: A | Discharge status: Home / Self Care | Payer: PPO | Source: Ambulatory Visit | Attending: Gastroenterology | Admitting: Gastroenterology

## 2023-07-03 VITALS — BP 129/76 | HR 77 | Temp 97.9°F | Resp 18 | Ht 65.0 in | Wt 128.4 lb

## 2023-07-03 DIAGNOSIS — Z01810 Encounter for preprocedural cardiovascular examination: Secondary | ICD-10-CM | POA: Insufficient documentation

## 2023-07-03 DIAGNOSIS — I10 Essential (primary) hypertension: Secondary | ICD-10-CM | POA: Insufficient documentation

## 2023-07-08 ENCOUNTER — Ambulatory Visit (HOSPITAL_COMMUNITY)
Admission: RE | Admit: 2023-07-08 | Discharge: 2023-07-08 | Disposition: A | Discharge status: Home / Self Care | Payer: PPO | Attending: Gastroenterology | Admitting: Gastroenterology

## 2023-07-08 ENCOUNTER — Ambulatory Visit (HOSPITAL_COMMUNITY): Payer: PPO | Admitting: Anesthesiology

## 2023-07-08 ENCOUNTER — Encounter (HOSPITAL_COMMUNITY): Payer: Self-pay | Admitting: Gastroenterology

## 2023-07-08 ENCOUNTER — Encounter (HOSPITAL_COMMUNITY)
Admission: RE | Disposition: A | Discharge status: Home / Self Care | Payer: Self-pay | Source: Home / Self Care | Attending: Gastroenterology

## 2023-07-08 ENCOUNTER — Ambulatory Visit (HOSPITAL_BASED_OUTPATIENT_CLINIC_OR_DEPARTMENT_OTHER): Payer: PPO | Admitting: Anesthesiology

## 2023-07-08 DIAGNOSIS — K5900 Constipation, unspecified: Secondary | ICD-10-CM

## 2023-07-08 DIAGNOSIS — D123 Benign neoplasm of transverse colon: Secondary | ICD-10-CM | POA: Diagnosis not present

## 2023-07-08 DIAGNOSIS — I10 Essential (primary) hypertension: Secondary | ICD-10-CM | POA: Insufficient documentation

## 2023-07-08 DIAGNOSIS — K648 Other hemorrhoids: Secondary | ICD-10-CM

## 2023-07-08 DIAGNOSIS — D126 Benign neoplasm of colon, unspecified: Secondary | ICD-10-CM

## 2023-07-08 DIAGNOSIS — K219 Gastro-esophageal reflux disease without esophagitis: Secondary | ICD-10-CM | POA: Diagnosis not present

## 2023-07-08 DIAGNOSIS — D122 Benign neoplasm of ascending colon: Secondary | ICD-10-CM | POA: Diagnosis not present

## 2023-07-08 DIAGNOSIS — K644 Residual hemorrhoidal skin tags: Secondary | ICD-10-CM | POA: Insufficient documentation

## 2023-07-08 DIAGNOSIS — Z800 Family history of malignant neoplasm of digestive organs: Secondary | ICD-10-CM | POA: Insufficient documentation

## 2023-07-08 DIAGNOSIS — R194 Change in bowel habit: Secondary | ICD-10-CM | POA: Diagnosis present

## 2023-07-08 DIAGNOSIS — E785 Hyperlipidemia, unspecified: Secondary | ICD-10-CM | POA: Insufficient documentation

## 2023-07-08 DIAGNOSIS — K5730 Diverticulosis of large intestine without perforation or abscess without bleeding: Secondary | ICD-10-CM

## 2023-07-08 DIAGNOSIS — F32A Depression, unspecified: Secondary | ICD-10-CM | POA: Diagnosis not present

## 2023-07-08 DIAGNOSIS — K635 Polyp of colon: Secondary | ICD-10-CM | POA: Diagnosis not present

## 2023-07-08 HISTORY — PX: COLONOSCOPY WITH PROPOFOL: SHX5780

## 2023-07-08 HISTORY — PX: SUBMUCOSAL LIFTING INJECTION: SHX6855

## 2023-07-08 HISTORY — PX: HEMOSTASIS CLIP PLACEMENT: SHX6857

## 2023-07-08 HISTORY — PX: POLYPECTOMY: SHX5525

## 2023-07-08 HISTORY — PX: SUBMUCOSAL TATTOO INJECTION: SHX6856

## 2023-07-08 LAB — HM COLONOSCOPY

## 2023-07-08 SURGERY — COLONOSCOPY WITH PROPOFOL
Anesthesia: General

## 2023-07-08 MED ORDER — PHENYLEPHRINE 80 MCG/ML (10ML) SYRINGE FOR IV PUSH (FOR BLOOD PRESSURE SUPPORT)
PREFILLED_SYRINGE | INTRAVENOUS | Status: AC
  Filled 2023-07-08: qty 10

## 2023-07-08 MED ORDER — SPOT INK MARKER SYRINGE KIT
PACK | SUBMUCOSAL | Status: AC
  Filled 2023-07-08: qty 5

## 2023-07-08 MED ORDER — LACTATED RINGERS IV SOLN: INTRAVENOUS | Status: DC

## 2023-07-08 MED ORDER — PROPOFOL 10 MG/ML IV BOLUS
INTRAVENOUS | Status: DC | PRN
Start: 2023-07-08 — End: 2023-07-08
  Administered 2023-07-08: 100 mg via INTRAVENOUS

## 2023-07-08 MED ORDER — SPOT INK MARKER SYRINGE KIT
PACK | SUBMUCOSAL | Status: DC | PRN
  Administered 2023-07-08: .5 mL via SUBMUCOSAL

## 2023-07-08 MED ORDER — PROPOFOL 500 MG/50ML IV EMUL
INTRAVENOUS | Status: DC | PRN
  Administered 2023-07-08: 150 ug/kg/min via INTRAVENOUS

## 2023-07-08 MED ORDER — LIDOCAINE HCL (CARDIAC) PF 100 MG/5ML IV SOSY
PREFILLED_SYRINGE | INTRAVENOUS | Status: DC | PRN
  Administered 2023-07-08: 50 mg via INTRAVENOUS

## 2023-07-08 NOTE — Interval H&P Note (Signed)
History and Physical Interval Note:  07/08/2023 8:50 AM  Kelsey Nelson  has presented today for surgery, with the diagnosis of Changes in bowel habits.  The various methods of treatment have been discussed with the patient and family. After consideration of risks, benefits and other options for treatment, the patient has consented to  Procedure(s) with comments: COLONOSCOPY WITH PROPOFOL (N/A) - 9:15am; asa 3 as a surgical intervention.  The patient's history has been reviewed, patient examined, no change in status, stable for surgery.  I have reviewed the patient's chart and labs.  Questions were answered to the patient's satisfaction.     [name]

## 2023-07-08 NOTE — Anesthesia Preprocedure Evaluation (Signed)
Anesthesia Evaluation  Patient identified by MRN, date of birth, ID band Patient awake    Reviewed: Allergy & Precautions, H&P , NPO status , Patient's Chart, lab work & pertinent test results  Airway Mallampati: II  TM Distance: >3 FB Neck ROM: Full    Dental  (+) Dental Advisory Given, Missing   Pulmonary neg pulmonary ROS   Pulmonary exam normal breath sounds clear to auscultation       Cardiovascular hypertension, Pt. on medications Normal cardiovascular exam Rhythm:Regular Rate:Normal     Neuro/Psych  PSYCHIATRIC DISORDERS  Depression    negative neurological ROS     GI/Hepatic Neg liver ROS,GERD  Medicated,,  Endo/Other  negative endocrine ROS    Renal/GU Renal InsufficiencyRenal disease  negative genitourinary   Musculoskeletal negative musculoskeletal ROS (+)    Abdominal   Peds negative pediatric ROS (+)  Hematology negative hematology ROS (+)   Anesthesia Other Findings Right eye blindness  Reproductive/Obstetrics negative OB ROS                              Anesthesia Physical Anesthesia Plan  ASA: 2  Anesthesia Plan: General   Post-op Pain Management: Minimal or no pain anticipated   Induction: Intravenous  PONV Risk Score and Plan: 1 and Treatment may vary due to age or medical condition and Propofol infusion  Airway Management Planned: Nasal Cannula and Natural Airway  Additional Equipment:   Intra-op Plan:   Post-operative Plan:   Informed Consent: I have reviewed the patients History and Physical, chart, labs and discussed the procedure including the risks, benefits and alternatives for the proposed anesthesia with the patient or authorized representative who has indicated his/her understanding and acceptance.     Dental advisory given  Plan Discussed with: CRNA and Surgeon  Anesthesia Plan Comments:          Anesthesia Quick Evaluation

## 2023-07-08 NOTE — Anesthesia Postprocedure Evaluation (Signed)
Anesthesia Post Note  Patient: Kelsey Nelson  Procedure(s) Performed: COLONOSCOPY WITH PROPOFOL POLYPECTOMY HEMOSTASIS CLIP PLACEMENT SUBMUCOSAL TATTOO INJECTION SUBMUCOSAL LIFTING INJECTION  Patient location during evaluation: Phase II Anesthesia Type: General Level of consciousness: awake and alert and oriented Pain management: pain level controlled Vital Signs Assessment: post-procedure vital signs reviewed and stable Respiratory status: spontaneous breathing, nonlabored ventilation and respiratory function stable Cardiovascular status: blood pressure returned to baseline and stable Postop Assessment: no apparent nausea or vomiting Anesthetic complications: no  No notable events documented.   Last Vitals:  Vitals:   07/08/23 0729 07/08/23 0943  BP: (!) 145/74 (!) 148/68  Pulse: 72 65  Resp: 14 15  Temp: 36.6 C (!) 36.4 C  SpO2: 98% 100%    Last Pain:  Vitals:   07/08/23 0943  TempSrc: Axillary  PainSc: Asleep                 Noreen Mackintosh C Jorie Zee

## 2023-07-08 NOTE — Op Note (Signed)
[facility] Patient Name: Kelsey Nelson Procedure Date: 07/08/2023 8:40 AM MRN: [id number] Date of Birth: April 01, 1934 Attending MD: [name] , , [id number] CSN: [id number] Age: 89 Admit Type: Outpatient Procedure:                Colonoscopy Indications:              Constipation Providers:                [name], Angelica Ran, Dyann Ruddle Referring MD:              Medicines:                Monitored Anesthesia Care Complications:            No immediate complications. Estimated Blood Loss:     Estimated blood loss: none. Procedure:                Pre-Anesthesia Assessment:                           - Prior to the procedure, a History and Physical                            was performed, and patient medications, allergies                            and sensitivities were reviewed. The patient's                            tolerance of previous anesthesia was reviewed.                           - The risks and benefits of the procedure and the                            sedation options and risks were discussed with the                            patient. All questions were answered and informed                            consent was obtained.                           - ASA Grade Assessment: II - A patient with mild                            systemic disease.                           After obtaining informed consent, the colonoscope                            was passed under direct vision. Throughout the                            procedure, the patient's blood pressure, pulse, and  oxygen saturations were monitored continuously. The                            [id number]-HQ190L ([id number]) scope was introduced through                            the anus and advanced to the the cecum, identified                            by appendiceal orifice and ileocecal valve. The                            colonoscopy was performed without difficulty. The                             patient tolerated the procedure well. The quality                            of the bowel preparation was adequate to identify                            polyps greater than 5 mm in size. Scope In: 8:58:52 AM Scope Out: 9:39:12 AM Scope Withdrawal Time: 0 hours 36 minutes 17 seconds  Total Procedure Duration: 0 hours 40 minutes 20 seconds  Findings:      Hemorrhoids were found on perianal exam.      A 15 to 18 mm polyp was found in the ascending colon. The polyp was       flat. Area was successfully injected with 1 mL Eleview for a lift       polypectomy. Imaging was performed using white light and narrow band       imaging to visualize the mucosa and demarcate the polyp site after       injection for EMR purposes. The polyp was removed with a piecemeal       technique using a cold snare. Resection and retrieval were complete.       After completeing the polypectomy, the edges of the polypectomy site       were ablated with the tip of the snare, soft coagulation settings. To       prevent bleeding after the polypectomy, four hemostatic clips were       successfully placed. Clip manufacturer: [employer]. There was no       bleeding at the end of the procedure. Area distal and contralateral was       tattooed with an injection of 0.5 mL of Spot (carbon black).      A 1 mm polyp was found in the ascending colon. The polyp was sessile.       The polyp was removed with a cold biopsy forceps. Resection and       retrieval were complete.      Eight sessile polyps were found in the transverse colon and ascending       colon. The polyps were 2 to 8 mm in size. These polyps were removed with       a cold snare. Resection and retrieval were complete.      Scattered small-mouthed diverticula were found in the sigmoid colon and  descending colon.      External and internal hemorrhoids were found during retroflexion. The       hemorrhoids were  medium-sized. Impression:               - Hemorrhoids found on perianal exam.                           - One 15 to 18 mm polyp in the ascending colon,                            removed piecemeal using a cold snare. Resected and                            retrieved via EMR. Clips were placed. Clip                            manufacturer: [employer]. Tattooed.                           - One 1 mm polyp in the ascending colon, removed                            with a cold biopsy forceps. Resected and retrieved.                           - Eight 2 to 8 mm polyps in the transverse colon                            and in the ascending colon, removed with a cold                            snare. Resected and retrieved.                           - Diverticulosis in the sigmoid colon and in the                            descending colon.                           - External and internal hemorrhoids. Moderate Sedation:      Per Anesthesia Care Recommendation:           - Discharge patient to home (ambulatory).                           - Resume previous diet.                           - Await pathology results.                           - Repeat colonoscopy in 9 months for surveillance  after piecemeal polypectomy. Procedure Code(s):        --- Professional ---                           [phone], 59, Colonoscopy, flexible; with removal of                            tumor(s), polyp(s), or other lesion(s) by snare                            technique                           45380, 59, Colonoscopy, flexible; with biopsy,                            single or multiple                           45381, Colonoscopy, flexible; with directed                            submucosal injection(s), any substance Diagnosis Code(s):        --- Professional ---                           D12.2, Benign neoplasm of ascending colon                           D12.3, Benign neoplasm of  transverse colon (hepatic                            flexure or splenic flexure)                           K64.8, Other hemorrhoids                           K59.00, Constipation, unspecified                           K57.30, Diverticulosis of large intestine without                            perforation or abscess without bleeding CPT copyright 2022 American Medical Association. All rights reserved. The codes documented in this report are preliminary and upon coder review may  be revised to meet current compliance requirements. [name], MD [name],  07/08/2023 9:47:31 AM This report has been signed electronically. Number of Addenda: 0

## 2023-07-08 NOTE — Transfer of Care (Signed)
Immediate Anesthesia Transfer of Care Note  Patient: Kelsey Nelson  Procedure(s) Performed: COLONOSCOPY WITH PROPOFOL POLYPECTOMY HEMOSTASIS CLIP PLACEMENT SUBMUCOSAL TATTOO INJECTION SUBMUCOSAL LIFTING INJECTION  Patient Location: Short Stay  Anesthesia Type:General  Level of Consciousness: drowsy  Airway & Oxygen Therapy: Patient Spontanous Breathing  Post-op Assessment: Report given to RN and Post -op Vital signs reviewed and stable  Post vital signs: Reviewed and stable  Last Vitals:  Vitals Value Taken Time  BP    Temp    Pulse    Resp    SpO2      Last Pain:  Vitals:   07/08/23 0853  TempSrc:   PainSc: 0-No pain         Complications: No notable events documented.

## 2023-07-08 NOTE — Discharge Instructions (Signed)
You are being discharged to home.  Resume your previous diet.  We are waiting for your pathology results.  Your physician has recommended a repeat colonoscopy in nine months for surveillance after today's piecemeal polypectomy.

## 2023-07-08 NOTE — Anesthesia Procedure Notes (Signed)
Date/Time: 07/08/2023 8:57 AM  Performed by: Julian Reil, CRNAPre-anesthesia Checklist: Patient identified, Emergency Drugs available, Suction available and Patient being monitored Patient Re-evaluated:Patient Re-evaluated prior to induction Oxygen Delivery Method: Nasal cannula Induction Type: IV induction Placement Confirmation: positive ETCO2

## 2023-07-09 ENCOUNTER — Encounter (INDEPENDENT_AMBULATORY_CARE_PROVIDER_SITE_OTHER): Payer: Self-pay | Admitting: *Deleted

## 2023-07-09 LAB — SURGICAL PATHOLOGY

## 2023-07-10 ENCOUNTER — Encounter (INDEPENDENT_AMBULATORY_CARE_PROVIDER_SITE_OTHER): Payer: Self-pay | Admitting: *Deleted

## 2023-07-14 ENCOUNTER — Encounter (HOSPITAL_COMMUNITY): Payer: Self-pay | Admitting: Gastroenterology

## 2023-07-14 ENCOUNTER — Other Ambulatory Visit: Payer: Self-pay | Admitting: Internal Medicine

## 2023-07-21 ENCOUNTER — Ambulatory Visit (INDEPENDENT_AMBULATORY_CARE_PROVIDER_SITE_OTHER): Payer: PPO

## 2023-07-21 DIAGNOSIS — L501 Idiopathic urticaria: Secondary | ICD-10-CM | POA: Diagnosis not present

## 2023-07-23 DIAGNOSIS — L501 Idiopathic urticaria: Secondary | ICD-10-CM | POA: Diagnosis not present

## 2023-08-11 DIAGNOSIS — H0102A Squamous blepharitis right eye, upper and lower eyelids: Secondary | ICD-10-CM | POA: Diagnosis not present

## 2023-08-11 DIAGNOSIS — Z961 Presence of intraocular lens: Secondary | ICD-10-CM | POA: Diagnosis not present

## 2023-08-11 DIAGNOSIS — H0102B Squamous blepharitis left eye, upper and lower eyelids: Secondary | ICD-10-CM | POA: Diagnosis not present

## 2023-08-11 DIAGNOSIS — H40052 Ocular hypertension, left eye: Secondary | ICD-10-CM | POA: Diagnosis not present

## 2023-08-11 DIAGNOSIS — H16222 Keratoconjunctivitis sicca, not specified as Sjogren's, left eye: Secondary | ICD-10-CM | POA: Diagnosis not present

## 2023-08-13 ENCOUNTER — Ambulatory Visit
Admission: EM | Admit: 2023-08-13 | Discharge: 2023-08-13 | Disposition: A | Discharge status: Home / Self Care | Payer: PPO | Attending: Family Medicine | Admitting: Family Medicine

## 2023-08-13 ENCOUNTER — Other Ambulatory Visit: Payer: Self-pay

## 2023-08-13 ENCOUNTER — Encounter: Payer: Self-pay | Admitting: Emergency Medicine

## 2023-08-13 DIAGNOSIS — H0289 Other specified disorders of eyelid: Secondary | ICD-10-CM

## 2023-08-13 MED ORDER — PREDNISONE 20 MG PO TABS: 40.0000 mg | ORAL_TABLET | Freq: Every day | ORAL | 0 refills | Status: DC

## 2023-08-13 NOTE — ED Triage Notes (Signed)
Pt reports has prosthetic right eye and reports "eyelid" has been itching for last several days.

## 2023-08-13 NOTE — ED Provider Notes (Signed)
[facility]   [id number] 08/13/23 Arrival Time: 1818  ASSESSMENT & PLAN:  1. Irritation of eyelid    Has erythromycin ophthalmic ointment and will continue to use this for the next few days. For irritation/itching, begin: Meds ordered this encounter  Medications   predniSONE (DELTASONE) 20 MG tablet    Sig: Take 2 tablets (40 mg total) by mouth daily.    Dispense:  10 tablet    Refill:  0   Reviewed expectations re: course of current medical issues. Questions answered. Outlined signs and symptoms indicating need for more acute intervention. Patient verbalized understanding. After Visit Summary given.   SUBJECTIVE:  Kelsey Nelson is a 89 y.o. female who presents with complaint of R eyelid irritation/itching; has prosthetic right eye and reports "eyelid" has been itching for last several days. Denies fever. Used e'mycin oph ointment today; no changes. Itching bothering her the most.   OBJECTIVE:  Vitals:   08/13/23 1824  BP: (!) 152/79  Pulse: 68  Resp: 20  Temp: 97.8 F (36.6 C)  TempSrc: Oral  SpO2: 95%    General appearance: alert; no distress HEENT: [place]; AT; PERRLA; no restriction of the extraocular movements OS: normal OD: with prosthetic eye; upper eyelid inflamed and irritated; no drainage from orbit Neck: supple without LAD Skin: warm and dry Psychological: alert and cooperative; normal mood and affect    Allergies  Allergen Reactions   Codeine    Crestor [Rosuvastatin] Other (See Comments)    Can't recall   Fosamax [Alendronate Sodium]     heartburn    Past Medical History:  Diagnosis Date   Acute cystitis with hematuria 10/26/2013   Allergic rhinitis 11/08/2017   Blindness of right eye    BMI between 19-24,adult 2010 145 LBS   Diverticula of small intestine Dec 2010   Diverticulosis    [place]   DIVERTICULOSIS, SIGMOID COLON 03/31/2008   Qualifier: Diagnosis of  By: Lind Guest  Noted on abdominal and pelvic scan in 04/2011.   Extensive colonic diverticulosis , and possible muscular hypertrophy at rectosigmoid junction    Family hx of colon cancer 11/09/2013   Functional constipation 2010   GERD (gastroesophageal reflux disease)    HTN (hypertension)    Hyperlipemia    Pancreatic cyst 02/05/2013   Renal cyst 05/27/2011   Right eye trauma 1949   Loss of vision resulted at age 10   Schatzki's ring Dec 2010   Skin lesions, generalized 06/19/2015   Tubular adenoma 11/09/2013   Social History   Socioeconomic History   Marital status: Married    Spouse name: Onalee Hua    Number of children: 3   Years of education: 12   Highest education level: 12th grade  Occupational History   Occupation: retired   Tobacco Use   Smoking status: Never    Passive exposure: Past   Smokeless tobacco: Never  Vaping Use   Vaping status: Never Used  Substance and Sexual Activity   Alcohol use: No    Alcohol/week: 0.0 standard drinks of alcohol   Drug use: No   Sexual activity: Not Currently  Other Topics Concern   Not on file  Social History Narrative   MARRIED TO DAVID 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/   Social Determinants of Health   Financial Resource Strain: Low Risk  (01/22/2022)   Overall Financial Resource Strain (CARDIA)    Difficulty of Paying Living Expenses: Not hard at all  Food Insecurity: No Food Insecurity (01/22/2022)  Hunger Vital Sign    Worried About Running Out of Food in the Last Year: Never true    Ran Out of Food in the Last Year: Never true  Transportation Needs: No Transportation Needs (01/22/2022)   PRAPARE - Administrator, Civil Service (Medical): No    Lack of Transportation (Non-Medical): No  Physical Activity: Sufficiently Active (01/22/2022)   Exercise Vital Sign    Days of Exercise per Week: 5 days    Minutes of Exercise per Session: 60 min  Stress: No Stress Concern Present (01/22/2022)   [employer] of Occupational Health - Occupational Stress Questionnaire    Feeling  of Stress : Not at all  Social Connections: Socially Integrated (01/22/2022)   Social Connection and Isolation Panel [NHANES]    Frequency of Communication with Friends and Family: Never    Frequency of Social Gatherings with Friends and Family: More than three times a week    Attends Religious Services: More than 4 times per year    Active Member of [employer] or Organizations: Yes    Attends Engineer, structural: More than 4 times per year    Marital Status: Married  Catering manager Violence: Not At Risk (01/22/2022)   Humiliation, Afraid, Rape, and Kick questionnaire    Fear of Current or Ex-Partner: No    Emotionally Abused: No    Physically Abused: No    Sexually Abused: No   Family History  Problem Relation Age of Onset   Pancreatic cancer Sister    Colon cancer Paternal Aunt    Colon polyps Neg Hx    Past Surgical History:  Procedure Laterality Date   ABDOMINAL HYSTERECTOMY     BREAST REDUCTION SURGERY  1992 BIL   COLONOSCOPY  DEC 2010   SIMPLE ADENOMAS (6-8), [place] TICS, SML IH   COLONOSCOPY N/A 12/08/2013   Procedure: COLONOSCOPY;  Surgeon: Malissa Hippo, MD;  [facility];  Service: Endoscopy;  Laterality: N/A;  225   COLONOSCOPY WITH PROPOFOL N/A 07/08/2023   Procedure: COLONOSCOPY WITH PROPOFOL;  Surgeon: Dolores Frame, MD;  Location: [facility];  Service: Gastroenterology;  Laterality: N/A;  9:15am; asa 3   EVISCERATION Right 05/08/2015   Procedure: EVISCERATION WITH IMPLANT RIGHT EYE;  Surgeon: Susa Simmonds, MD;  Location: [facility];  Service: Ophthalmology;  Laterality: Right;   EYE SURGERY  RIGHT 2005   EYE SURGERY  right 2016   removed and false eye placed in 08/2015   HEMOSTASIS CLIP PLACEMENT  07/08/2023   Procedure: HEMOSTASIS CLIP PLACEMENT;  Surgeon: Dolores Frame, MD;  [facility]: [facility];  Service: Gastroenterology;;   POLYPECTOMY  07/08/2023   Procedure: POLYPECTOMY;  Surgeon: Dolores Frame, MD;   [facility];  Service: Gastroenterology;;   SUBMUCOSAL LIFTING INJECTION  07/08/2023   Procedure: SUBMUCOSAL LIFTING INJECTION;  Surgeon: Dolores Frame, MD;  [facility];  Service: Gastroenterology;;   SUBMUCOSAL TATTOO INJECTION  07/08/2023   Procedure: SUBMUCOSAL TATTOO INJECTION;  Surgeon: Dolores Frame, MD;  [facility];  Service: Gastroenterology;;   TOTAL ABDOMINAL HYSTERECTOMY W/ BILATERAL SALPINGOOPHORECTOMY  1985 FIBROIDS   UPPER GASTROINTESTINAL ENDOSCOPY  DEC 2010   Bx-MILD GASTRITIS/DUODENITIS, DUODENAL DIVERTICULA, SCHATZKI'S RING      Mardella Layman, MD 08/13/23 1850

## 2023-08-15 ENCOUNTER — Ambulatory Visit: Payer: PPO | Admitting: Family Medicine

## 2023-08-15 ENCOUNTER — Ambulatory Visit: Payer: PPO | Admitting: Internal Medicine

## 2023-08-15 ENCOUNTER — Encounter: Payer: Self-pay | Admitting: Family Medicine

## 2023-08-15 ENCOUNTER — Ambulatory Visit (INDEPENDENT_AMBULATORY_CARE_PROVIDER_SITE_OTHER): Payer: PPO | Admitting: Family Medicine

## 2023-08-15 VITALS — BP 138/68 | HR 78 | Ht 65.5 in | Wt 125.0 lb

## 2023-08-15 DIAGNOSIS — T859XXA Unspecified complication of internal prosthetic device, implant and graft, initial encounter: Secondary | ICD-10-CM | POA: Diagnosis not present

## 2023-08-15 NOTE — Assessment & Plan Note (Signed)
Advise to continue with Prednisone treatment Referral placed to Opthalmology for further evaluation Visit ED for worsening symptoms

## 2023-08-15 NOTE — Patient Instructions (Signed)
        Great to see you today.   - Please take medications as prescribed. - Follow up with your primary health provider if any health concerns arises. - If symptoms worsen please contact your primary care provider and/or visit the emergency department.  

## 2023-08-15 NOTE — Progress Notes (Signed)
New Patient Office Visit   Subjective   Patient ID: Kelsey Nelson, female    DOB: 10-11-1934  Age: 89 y.o. MRN: [id number]  CC:  Chief Complaint  Patient presents with   Eye Problem    Rt eye concerns. Pt. Has prosthetic eye, has been itching, x3 days went to UC on 08/13/23  was given prednisone,  started medication yesterday. after medication use eye is still red, itching and swollen. Pt. Would like advice and referral to whom can assist with prosthetic.     HPI Kelsey Nelson 89 year old female, presents to the clinic for right eye prosthetic complications started 3 days ago. She  has a past medical history of Acute cystitis with hematuria (10/26/2013), Allergic rhinitis (11/08/2017), Blindness of right eye, BMI between 19-24,adult (2010 145 LBS), Diverticula of small intestine (Dec 2010), Diverticulosis, DIVERTICULOSIS, SIGMOID COLON (03/31/2008), Family hx of colon cancer (11/09/2013), Functional constipation (2010), GERD (gastroesophageal reflux disease), HTN (hypertension), Hyperlipemia, Pancreatic cyst (02/05/2013), Renal cyst (05/27/2011), Right eye trauma (1949), Schatzki's ring (Dec 2010), Skin lesions, generalized (06/19/2015), and Tubular adenoma (11/09/2013).   The right prosthetic eye/ eyelid is affected. This is a chronic problem. Itchiness occurs constantly and gradually worsening since onset. There was no injury mechanism. The pain is at a severity of 0/10. There is No known exposure to pink eye. She Does not wear contacts. She has tried prednisone and  erythromycin ophthalmic ointment or the symptoms. The treatment provided moderate relief.        Outpatient Encounter Medications as of 08/15/2023  Medication Sig   Calcium Carbonate Antacid (TUMS PO) Take 1 tablet by mouth 3 (three) times daily as needed.   Cholecalciferol (VITAMIN D3 PO) Take 25 mcg by mouth daily.   conjugated estrogens (PREMARIN) vaginal cream Place 1 Applicatorful vaginally every other day.    diphenhydrAMINE (BENADRYL ALLERGY) 25 mg capsule Take 1 capsule (25 mg total) by mouth every 6 (six) hours as needed.   donepezil (ARICEPT) 10 MG tablet Take 1 tablet (10 mg total) by mouth at bedtime. (Patient not taking: Reported on 06/23/2023)   EPINEPHrine (EPIPEN 2-PAK) 0.3 mg/0.3 mL IJ SOAJ injection Inject 0.3 mg into the muscle as needed for anaphylaxis.   famotidine (PEPCID) 20 MG tablet Take 1 tablet (20 mg total) by mouth 2 (two) times daily. (Patient not taking: Reported on 06/23/2023)   fexofenadine (ALLEGRA ALLERGY) 180 MG tablet Take 1 tablet (180 mg total) by mouth every morning.   fluticasone (FLONASE) 50 MCG/ACT nasal spray Place 1 spray into both nostrils daily.   linaclotide (LINZESS) 145 MCG CAPS capsule Take 1 capsule (145 mcg total) by mouth daily.   methenamine (HIPREX) 1 g tablet Take 1 tablet (1 g total) by mouth at bedtime. (Patient not taking: Reported on 06/23/2023)   Multiple Vitamin (MULTIVITAMIN PO) Take by mouth.   omeprazole (PRILOSEC) 20 MG capsule Take 1 capsule (20 mg total) by mouth daily.   OVER THE COUNTER MEDICATION Multi Collagen Protein - Patient states that she takes 1 scoop a day.   OVER THE COUNTER MEDICATION One daily hylands leg cramp relief   OVER THE COUNTER MEDICATION Omega xl joint and muscle support daily   polyethylene glycol powder (GLYCOLAX/MIRALAX) 17 GM/SCOOP powder Take 17 g by mouth daily as needed. (Patient not taking: Reported on 06/23/2023)   predniSONE (DELTASONE) 20 MG tablet Take 2 tablets (40 mg total) by mouth daily.   Probiotic Product (PROBIOTIC DAILY PO) Take 1 tablet by mouth  daily. As needed   ramipril (ALTACE) 10 MG capsule Take one capsule by mouth once daily for blood pressure   XOLAIR 150 MG/ML prefilled syringe Inject 300 mg into the skin every 28 days.   Facility-Administered Encounter Medications as of 08/15/2023  Medication   omalizumab Geoffry Paradise) prefilled syringe 300 mg    Past Surgical History:  Procedure Laterality  Date   ABDOMINAL HYSTERECTOMY     BREAST REDUCTION SURGERY  1992 BIL   COLONOSCOPY  DEC 2010   SIMPLE ADENOMAS (6-8), [place] TICS, SML IH   COLONOSCOPY N/A 12/08/2013   Procedure: COLONOSCOPY;  Surgeon: Malissa Hippo, MD;  [facility];  Service: Endoscopy;  Laterality: N/A;  225   COLONOSCOPY WITH PROPOFOL N/A 07/08/2023   Procedure: COLONOSCOPY WITH PROPOFOL;  Surgeon: Dolores Frame, MD;  [facility];  Service: Gastroenterology;  Laterality: N/A;  9:15am; asa 3   EVISCERATION Right 05/08/2015   Procedure: EVISCERATION WITH IMPLANT RIGHT EYE;  Surgeon: Susa Simmonds, MD;  Location: [facility];  Service: Ophthalmology;  Laterality: Right;   EYE SURGERY  RIGHT 2005   EYE SURGERY  right 2016   removed and false eye placed in 08/2015   HEMOSTASIS CLIP PLACEMENT  07/08/2023   Procedure: HEMOSTASIS CLIP PLACEMENT;  Surgeon: Dolores Frame, MD;  [facility];  Service: Gastroenterology;;   POLYPECTOMY  07/08/2023   Procedure: POLYPECTOMY;  Surgeon: Dolores Frame, MD;  [facility];  Service: Gastroenterology;;   SUBMUCOSAL LIFTING INJECTION  07/08/2023   Procedure: SUBMUCOSAL LIFTING INJECTION;  Surgeon: Dolores Frame, MD;  [facility];  Service: Gastroenterology;;   SUBMUCOSAL TATTOO INJECTION  07/08/2023   Procedure: SUBMUCOSAL TATTOO INJECTION;  Surgeon: Dolores Frame, MD;  [facility];  Service: Gastroenterology;;   TOTAL ABDOMINAL HYSTERECTOMY W/ BILATERAL SALPINGOOPHORECTOMY  1985 FIBROIDS   UPPER GASTROINTESTINAL ENDOSCOPY  DEC 2010   Bx-MILD GASTRITIS/DUODENITIS, DUODENAL DIVERTICULA, SCHATZKI'S RING    Review of Systems  Constitutional:  Negative for chills and fever.  Eyes:  Positive for redness. Negative for blurred vision, pain and discharge.  Respiratory:  Negative for shortness of breath.   Cardiovascular:  Negative for chest pain.  Skin:  Positive for  itching.  Neurological:  Negative for dizziness and headaches.      Objective    BP 138/68 (BP Location: Left Arm, Patient Position: Sitting, Cuff Size: Normal)   Pulse 78   Ht 5' 5.5" (1.664 m)   Wt 125 lb (56.7 kg)   SpO2 95%   BMI 20.48 kg/m   Physical Exam Vitals reviewed.  Constitutional:      General: She is not in acute distress.    Appearance: Normal appearance. She is not ill-appearing, toxic-appearing or diaphoretic.  HENT:     Head: Normocephalic.  Eyes:     General:        Right eye: No discharge.        Left eye: No discharge.     Comments: Right eyelid redness   Cardiovascular:     Rate and Rhythm: Normal rate.     Pulses: Normal pulses.     Heart sounds: Normal heart sounds.  Pulmonary:     Effort: Pulmonary effort is normal. No respiratory distress.     Breath sounds: Normal breath sounds.  Musculoskeletal:        General: Normal range of motion.     Cervical back: Normal range of motion.  Skin:  General: Skin is warm and dry.     Capillary Refill: Capillary refill takes less than 2 seconds.  Neurological:     General: No focal deficit present.     Mental Status: She is alert and oriented to person, place, and time.     Coordination: Coordination normal.     Gait: Gait normal.  Psychiatric:        Mood and Affect: Mood normal.       Assessment & Plan:  Complication of prosthetic eye, initial encounter Assessment & Plan: Advise to continue with Prednisone treatment Referral placed to Opthalmology for further evaluation Visit ED for worsening symptoms   Orders: -     Ambulatory referral to Ophthalmology    Return if symptoms worsen or fail to improve.   Cruzita Lederer Newman Nip, FNP

## 2023-08-16 ENCOUNTER — Encounter: Payer: Self-pay | Admitting: Emergency Medicine

## 2023-08-16 ENCOUNTER — Ambulatory Visit: Admission: EM | Admit: 2023-08-16 | Discharge: 2023-08-16 | Discharge status: Against Medical Advice | Payer: PPO

## 2023-08-16 ENCOUNTER — Other Ambulatory Visit: Payer: Self-pay

## 2023-08-16 NOTE — ED Triage Notes (Signed)
Pt reports seen for same on Wednesday and reports was seen by pcp and an ophthalmology referral was sent. Pt reports "prednisone is helping but I want someone to look at my eye."

## 2023-08-18 ENCOUNTER — Telehealth: Payer: Self-pay | Admitting: Family Medicine

## 2023-08-18 ENCOUNTER — Ambulatory Visit (INDEPENDENT_AMBULATORY_CARE_PROVIDER_SITE_OTHER): Payer: PPO

## 2023-08-18 DIAGNOSIS — L501 Idiopathic urticaria: Secondary | ICD-10-CM

## 2023-08-18 NOTE — Telephone Encounter (Signed)
Patient called asking who does she need to go see for her right eye.  Does she go see her eye doctor?

## 2023-08-19 NOTE — Telephone Encounter (Signed)
Lmtrc-kg

## 2023-08-21 DIAGNOSIS — T85390A Other mechanical complication of prosthetic orbit of right eye, initial encounter: Secondary | ICD-10-CM | POA: Diagnosis not present

## 2023-08-21 DIAGNOSIS — Y772 Prosthetic and other implants, materials and accessory ophthalmic devices associated with adverse incidents: Secondary | ICD-10-CM | POA: Diagnosis not present

## 2023-08-27 ENCOUNTER — Ambulatory Visit (INDEPENDENT_AMBULATORY_CARE_PROVIDER_SITE_OTHER): Payer: PPO | Admitting: Family Medicine

## 2023-08-27 VITALS — BP 128/82 | HR 62 | Ht 65.0 in | Wt 124.1 lb

## 2023-08-27 DIAGNOSIS — I10 Essential (primary) hypertension: Secondary | ICD-10-CM

## 2023-08-27 DIAGNOSIS — T859XXD Unspecified complication of internal prosthetic device, implant and graft, subsequent encounter: Secondary | ICD-10-CM

## 2023-08-27 DIAGNOSIS — Z23 Encounter for immunization: Secondary | ICD-10-CM

## 2023-08-27 DIAGNOSIS — Z1231 Encounter for screening mammogram for malignant neoplasm of breast: Secondary | ICD-10-CM

## 2023-08-27 DIAGNOSIS — E782 Mixed hyperlipidemia: Secondary | ICD-10-CM | POA: Diagnosis not present

## 2023-08-27 DIAGNOSIS — F5104 Psychophysiologic insomnia: Secondary | ICD-10-CM | POA: Diagnosis not present

## 2023-08-27 DIAGNOSIS — L508 Other urticaria: Secondary | ICD-10-CM | POA: Diagnosis not present

## 2023-08-27 DIAGNOSIS — E559 Vitamin D deficiency, unspecified: Secondary | ICD-10-CM

## 2023-08-27 NOTE — Patient Instructions (Addendum)
Annual exam in Feb call if you need me sooner  No med changes  Pls schedule mammogram at checkout  I will contact Dr Laruth Bouchard office with right eye prosthesis issue and get back in touch  Fasting CBC, lipid, cmp and eGFr, tSH ,and vit D   Please measure height prior to checkout, and document  Thanks for choosing [facility], we consider it a privelige to serve you.

## 2023-08-28 DIAGNOSIS — H0102A Squamous blepharitis right eye, upper and lower eyelids: Secondary | ICD-10-CM | POA: Diagnosis not present

## 2023-08-28 DIAGNOSIS — H0102B Squamous blepharitis left eye, upper and lower eyelids: Secondary | ICD-10-CM | POA: Diagnosis not present

## 2023-09-02 ENCOUNTER — Encounter: Payer: Self-pay | Admitting: Family Medicine

## 2023-09-02 NOTE — Assessment & Plan Note (Signed)
Controlled, no change in medication DASH diet and commitment to daily physical activity for a minimum of 30 minutes discussed and encouraged, as a part of hypertension management. The importance of attaining a healthy weight is also discussed.     08/27/2023    1:40 PM 08/27/2023    1:06 PM 08/16/2023    9:06 AM 08/15/2023   10:41 AM 08/15/2023   10:38 AM 08/13/2023    6:24 PM 07/08/2023    9:43 AM  BP/Weight  Systolic BP 128 111 127 138 147 152 148  Diastolic BP 82 71 82 68 88 79 68  Wt. (Lbs)  124.12   125    BMI  20.65 kg/m2   20.48 kg/m2

## 2023-09-02 NOTE — Assessment & Plan Note (Signed)
Hyperlipidemia:Low fat diet discussed and encouraged.   Lipid Panel  Lab Results  Component Value Date   CHOL 288 (H) 01/21/2023   HDL 102 01/21/2023   LDLCALC 168 (H) 01/21/2023   TRIG 106 01/21/2023   CHOLHDL 2.8 01/21/2023     Updated lab needed at/ before next visit.

## 2023-09-02 NOTE — Assessment & Plan Note (Signed)
New appt arranged with opthalmology

## 2023-09-02 NOTE — Progress Notes (Signed)
Kelsey Nelson     MRN: [id number]      DOB: 10-05-1934  Chief Complaint  Patient presents with   Knee Pain    F/u for knee pain.    Leg Pain    Having stiff leg cramps at night. Causing trouble with sleep.    Immunizations    Pt. Has consented to Flu vaccine . Vaccine given by CMA student.     HPI Kelsey Nelson is here for follow up and re-evaluation of chronic medical conditions, medication management and review of any available recent lab and radiology data.  Preventive health is updated, specifically  Cancer screening and Immunization.   Questions or concerns regarding consultations or procedures which the PT has had in the interim are  addressed. The PT denies any adverse reactions to current medications since the last visit.  Concerns as above, chronic insomnia which cannot be resolved Memory still a challenge tolerating meds well Wants eye exam of right prosthesis  bed, had recent flare of pain and redness and has not had eye exam  saw Provider in [place] who she states was incapable of removing the prosthesis  ROS Denies recent fever or chills. Denies sinus pressure, nasal congestion, ear pain or sore throat. Denies chest congestion, productive cough or wheezing. Denies chest pains, palpitations and leg swelling Denies abdominal pain, nausea, vomiting,diarrhea or constipation.   Denies dysuria, frequency, hesitancy or incontinence. Denies joint pain, swelling and limitation in mobility. Denies headaches, seizures, numbness, or tingling. Denies depression, anxiety chronic  insomnia.unchnaged Denies skin break down or rash.   PE  BP 128/82   Pulse 62   Ht 5\' 5"  (1.651 m)   Wt 124 lb 1.9 oz (56.3 kg)   SpO2 93%   BMI 20.65 kg/m   Patient alert and oriented and in no cardiopulmonary distress.  HEENT: No facial asymmetry, EOMI,     Neck supple .  Chest: Clear to auscultation bilaterally.  CVS: S1, S2 no murmurs, no S3.Regular rate.  ABD: Soft non tender.    Ext: No edema  MS: Adequate ROM spine, shoulders, hips and knees.  Skin: Intact, no ulcerations or rash noted.  Psych: Good eye contact, normal affect. Memory intact not anxious or depressed appearing.  CNS: CN 2-12 intact, power,  normal throughout.no focal deficits noted.   Assessment & Plan  HTN (hypertension) Controlled, no change in medication DASH diet and commitment to daily physical activity for a minimum of 30 minutes discussed and encouraged, as a part of hypertension management. The importance of attaining a healthy weight is also discussed.     08/27/2023    1:40 PM 08/27/2023    1:06 PM 08/16/2023    9:06 AM 08/15/2023   10:41 AM 08/15/2023   10:38 AM 08/13/2023    6:24 PM 07/08/2023    9:43 AM  BP/Weight  Systolic BP 128 111 127 138 147 152 148  Diastolic BP 82 71 82 68 88 79 68  Wt. (Lbs)  124.12   125    BMI  20.65 kg/m2   20.48 kg/m2         Mixed hyperlipidemia Hyperlipidemia:Low fat diet discussed and encouraged.   Lipid Panel  Lab Results  Component Value Date   CHOL 288 (H) 01/21/2023   HDL 102 01/21/2023   LDLCALC 168 (H) 01/21/2023   TRIG 106 01/21/2023   CHOLHDL 2.8 01/21/2023     Updated lab needed at/ before next visit.   Insomnia Unchanged Sleep hygiene  reviewed and written information offered also. No response to any medication tried  Chronic urticaria On immunotherapy with improvement  Complication of prosthetic eye New appt arranged with opthalmology

## 2023-09-02 NOTE — Assessment & Plan Note (Signed)
On immunotherapy with improvement

## 2023-09-02 NOTE — Assessment & Plan Note (Signed)
Unchanged Sleep hygiene reviewed and written information offered also. No response to any medication tried

## 2023-09-09 ENCOUNTER — Other Ambulatory Visit: Payer: Self-pay | Admitting: Family Medicine

## 2023-09-15 ENCOUNTER — Ambulatory Visit (INDEPENDENT_AMBULATORY_CARE_PROVIDER_SITE_OTHER): Payer: PPO

## 2023-09-15 DIAGNOSIS — L501 Idiopathic urticaria: Secondary | ICD-10-CM | POA: Diagnosis not present

## 2023-09-22 ENCOUNTER — Ambulatory Visit: Payer: PPO | Admitting: Urology

## 2023-09-22 NOTE — Progress Notes (Deleted)
Name: Kelsey Nelson DOB: 1934/10/30 MRN: [id number]  History of Present Illness: Kelsey Nelson is a 89 y.o. female who presents today for follow up visit at [facility]. She is accompanied by ***. - GU history: 1. Recurrent UTI. - For UTI prevention: ***Taking Hiprex and uses topical vaginal estrogen cream. 2. OAB with urinary frequency, nocturia, urgency, and urge incontinence. - Failed Gemtesa.  Urine culture results in past 12 months: - 01/08/2023: Positive for E. Coli - No additional urine culture results in past 12 months found per chart review.  At last visit with Dr. Ronne Binning on 04/09/2023: - Denies recent UTI(s). - Reports intermittent constipation.  - The plan was to continue Hiprex and topical vaginal estrogen cream for UTI prevention.  Since last visit: ***  Today: She {Actions; denies-reports:120008} vaginal bulge sensation.  She {Actions; denies-reports:120008} seeing a vaginal bulge. This bulge is bothersome and is the size of ***. It was first noticed ***.  She {Actions; denies-reports:120008} vaginal pain, bleeding, discharge. She {Actions; denies-reports:120008} using topical vaginal estrogen cream *** time(s) per week.  She {Actions; denies-reports:120008} being sexually active. She {Actions; denies-reports:120008} dyspareunia. ***Dyspareunia is located ***at the introitus / ***deep inside the vagina.  She has *** child(ren) delivered ***vaginally / ***via c-section. She is post menopausal.  She {Actions; denies-reports:120008} recent UTIs.  She {Actions; denies-reports:120008} increased urinary urgency, frequency, dysuria, gross hematuria, straining to void, or sensations of incomplete emptying.  She {Actions; denies-reports:120008} acute flank pain or abdominal pain.  ***will likely need pelvic exam & referral elsewhere for POP management   Fall Screening: Do you usually have a device to assist in your mobility? {yes/no:20286} ***cane  / ***walker / ***wheelchair   Medications: Current Outpatient Medications  Medication Sig Dispense Refill   Calcium Carbonate Antacid (TUMS PO) Take 1 tablet by mouth 3 (three) times daily as needed.     Cholecalciferol (VITAMIN D3 PO) Take 25 mcg by mouth daily.     conjugated estrogens (PREMARIN) vaginal cream Place 1 Applicatorful vaginally every other day. 42.5 g 5   diphenhydrAMINE (BENADRYL ALLERGY) 25 mg capsule Take 1 capsule (25 mg total) by mouth every 6 (six) hours as needed. 30 capsule 0   donepezil (ARICEPT) 10 MG tablet Take 1 tablet (10 mg total) by mouth at bedtime. 30 tablet 3   EPINEPHrine (EPIPEN 2-PAK) 0.3 mg/0.3 mL IJ SOAJ injection Inject 0.3 mg into the muscle as needed for anaphylaxis. (Patient not taking: Reported on 08/27/2023) 2 each 1   famotidine (PEPCID) 20 MG tablet Take 1 tablet (20 mg total) by mouth 2 (two) times daily. (Patient not taking: Reported on 06/23/2023) 60 tablet 3   fexofenadine (ALLEGRA ALLERGY) 180 MG tablet Take 1 tablet (180 mg total) by mouth every morning. 30 tablet 5   fluticasone (FLONASE) 50 MCG/ACT nasal spray Place 1 spray into both nostrils daily. 16 g 5   linaclotide (LINZESS) 145 MCG CAPS capsule Take 1 capsule (145 mcg total) by mouth daily. (Patient not taking: Reported on 08/27/2023) 30 capsule 2   methenamine (HIPREX) 1 g tablet Take 1 tablet (1 g total) by mouth at bedtime. 90 tablet 3   Multiple Vitamin (MULTIVITAMIN PO) Take by mouth.     omeprazole (PRILOSEC) 20 MG capsule Take 1 capsule (20 mg total) by mouth daily. 30 capsule 0   OVER THE COUNTER MEDICATION Multi Collagen Protein - Patient states that she takes 1 scoop a day.     OVER THE COUNTER MEDICATION One daily  hylands leg cramp relief     OVER THE COUNTER MEDICATION Omega xl joint and muscle support daily     polyethylene glycol powder (GLYCOLAX/MIRALAX) 17 GM/SCOOP powder Take 17 g by mouth daily as needed. 3350 g 1   Probiotic Product (PROBIOTIC DAILY PO) Take 1 tablet by  mouth daily. As needed     ramipril (ALTACE) 10 MG capsule Take one capsule by mouth once daily for blood pressure 90 capsule 3   XOLAIR 150 MG/ML prefilled syringe Inject 300 mg into the skin every 28 days. 2 mL 5   Current Facility-Administered Medications  Medication Dose Route Frequency Provider Last Rate Last Admin   omalizumab Geoffry Paradise) prefilled syringe 300 mg  300 mg Subcutaneous Q28 days Alfonse Spruce, MD   300 mg at 09/15/23 9518    Allergies: Allergies  Allergen Reactions   Codeine    Crestor [Rosuvastatin] Other (See Comments)    Can't recall   Fosamax [Alendronate Sodium]     heartburn    Past Medical History:  Diagnosis Date   Acute cystitis with hematuria 10/26/2013   Allergic rhinitis 11/08/2017   Blindness of right eye    BMI between 19-24,adult 2010 145 LBS   Diverticula of small intestine Dec 2010   Diverticulosis    [place]   DIVERTICULOSIS, SIGMOID COLON 03/31/2008   Qualifier: Diagnosis of  By: Lind Guest  Noted on abdominal and pelvic scan in 04/2011.  Extensive colonic diverticulosis , and possible muscular hypertrophy at rectosigmoid junction    Family hx of colon cancer 11/09/2013   Functional constipation 2010   GERD (gastroesophageal reflux disease)    HTN (hypertension)    Hyperlipemia    Pancreatic cyst 02/05/2013   Renal cyst 05/27/2011   Right eye trauma 1949   Loss of vision resulted at age 62   Schatzki's ring Dec 2010   Skin lesions, generalized 06/19/2015   Tubular adenoma 11/09/2013   Past Surgical History:  Procedure Laterality Date   ABDOMINAL HYSTERECTOMY     BREAST REDUCTION SURGERY  1992 BIL   COLONOSCOPY  DEC 2010   SIMPLE ADENOMAS (6-8), [place] TICS, SML IH   COLONOSCOPY N/A 12/08/2013   Procedure: COLONOSCOPY;  Surgeon: Malissa Hippo, MD;  [facility]: [facility];  Service: Endoscopy;  Laterality: N/A;  225   COLONOSCOPY WITH PROPOFOL N/A 07/08/2023   Procedure: COLONOSCOPY WITH PROPOFOL;  Surgeon: Dolores Frame,  MD;  Location: [facility];  Service: Gastroenterology;  Laterality: N/A;  9:15am; asa 3   EVISCERATION Right 05/08/2015   Procedure: EVISCERATION WITH IMPLANT RIGHT EYE;  Surgeon: Susa Simmonds, MD;  Location: [facility];  Service: Ophthalmology;  Laterality: Right;   EYE SURGERY  RIGHT 2005   EYE SURGERY  right 2016   removed and false eye placed in 08/2015   HEMOSTASIS CLIP PLACEMENT  07/08/2023   Procedure: HEMOSTASIS CLIP PLACEMENT;  Surgeon: Dolores Frame, MD;  Location: [facility];  Service: Gastroenterology;;   POLYPECTOMY  07/08/2023   Procedure: POLYPECTOMY;  Surgeon: Dolores Frame, MD;  [facility];  Service: Gastroenterology;;   SUBMUCOSAL LIFTING INJECTION  07/08/2023   Procedure: SUBMUCOSAL LIFTING INJECTION;  Surgeon: Dolores Frame, MD;  [facility];  Service: Gastroenterology;;   SUBMUCOSAL TATTOO INJECTION  07/08/2023   Procedure: SUBMUCOSAL TATTOO INJECTION;  Surgeon: Dolores Frame, MD;  [facility];  Service: Gastroenterology;;   TOTAL ABDOMINAL HYSTERECTOMY W/ BILATERAL SALPINGOOPHORECTOMY  1985 FIBROIDS  UPPER GASTROINTESTINAL ENDOSCOPY  DEC 2010   Bx-MILD GASTRITIS/DUODENITIS, DUODENAL DIVERTICULA, SCHATZKI'S RING   Family History  Problem Relation Age of Onset   Pancreatic cancer Sister    Colon cancer Paternal Aunt    Colon polyps Neg Hx    Social History   Socioeconomic History   Marital status: Married    Spouse name: Onalee Hua    Number of children: 3   Years of education: 12   Highest education level: 12th grade  Occupational History   Occupation: retired   Tobacco Use   Smoking status: Never    Passive exposure: Past   Smokeless tobacco: Never  Vaping Use   Vaping status: Never Used  Substance and Sexual Activity   Alcohol use: No    Alcohol/week: 0.0 standard drinks of alcohol   Drug use: No   Sexual activity: Not Currently  Other Topics Concern   Not on file   Social History Narrative   MARRIED TO DAVID Hosking. 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/   Social Determinants of Health   Financial Resource Strain: Low Risk  (01/22/2022)   Overall Financial Resource Strain (CARDIA)    Difficulty of Paying Living Expenses: Not hard at all  Food Insecurity: No Food Insecurity (01/22/2022)   Hunger Vital Sign    Worried About Running Out of Food in the Last Year: Never true    Ran Out of Food in the Last Year: Never true  Transportation Needs: No Transportation Needs (01/22/2022)   PRAPARE - Administrator, Civil Service (Medical): No    Lack of Transportation (Non-Medical): No  Physical Activity: Sufficiently Active (01/22/2022)   Exercise Vital Sign    Days of Exercise per Week: 5 days    Minutes of Exercise per Session: 60 min  Stress: No Stress Concern Present (01/22/2022)   [employer] of Occupational Health - Occupational Stress Questionnaire    Feeling of Stress : Not at all  Social Connections: Socially Integrated (01/22/2022)   Social Connection and Isolation Panel [NHANES]    Frequency of Communication with Friends and Family: Never    Frequency of Social Gatherings with Friends and Family: More than three times a week    Attends Religious Services: More than 4 times per year    Active Member of [employer] or Organizations: Yes    Attends Engineer, structural: More than 4 times per year    Marital Status: Married  Catering manager Violence: Not At Risk (01/22/2022)   Humiliation, Afraid, Rape, and Kick questionnaire    Fear of Current or Ex-Partner: No    Emotionally Abused: No    Physically Abused: No    Sexually Abused: No    Review of Systems Constitutional: Patient ***denies any unintentional weight loss or change in strength lntegumentary: Patient ***denies any rashes or pruritus Eyes: Patient denies ***dry eyes ENT: Patient ***denies dry mouth Cardiovascular: Patient ***denies chest pain or syncope Respiratory:  Patient ***denies shortness of breath Gastrointestinal: Patient ***denies nausea, vomiting, constipation, or diarrhea Musculoskeletal: Patient ***denies muscle cramps or weakness Neurologic: Patient ***denies convulsions or seizures Psychiatric: Patient ***denies memory problems Allergic/Immunologic: Patient ***denies recent allergic reaction(s) Hematologic/Lymphatic: Patient denies bleeding tendencies Endocrine: Patient ***denies heat/cold intolerance  GU: As per HPI.  OBJECTIVE There were no vitals filed for this visit. There is no height or weight on file to calculate BMI.  Physical Examination Constitutional: ***No obvious distress; patient is ***non-toxic appearing  Cardiovascular: ***No visible lower extremity edema.  Respiratory: The patient does ***  not have audible wheezing/stridor; respirations do ***not appear labored  Gastrointestinal: Abdomen ***non-distended Musculoskeletal: ***Normal ROM of UEs  Skin: ***No obvious rashes/open sores  Neurologic: CN 2-12 grossly ***intact Psychiatric: Answered questions ***appropriately with ***normal affect  Hematologic/Lymphatic/Immunologic: ***No obvious bruises or sites of spontaneous bleeding  UA: ***negative / *** WBC/hpf, *** RBC/hpf, bacteria (***) PVR: *** ml  ASSESSMENT No diagnosis found. ***  Will plan for follow up in *** months / ***1 year or sooner if needed. Pt verbalized understanding and agreement. All questions were answered.  PLAN Advised the following: 1. *** 2. ***No follow-ups on file.  No orders of the defined types were placed in this encounter.   It has been explained that the patient is to follow regularly with their PCP in addition to all other providers involved in their care and to follow instructions provided by these respective offices. Patient advised to contact urology clinic if any urologic-pertaining questions, concerns, new symptoms or problems arise in the interim period.  There are no  Patient Instructions on file for this visit.  Electronically signed by:  Donnita Falls, FNP   09/22/23    9:09 AM

## 2023-09-24 ENCOUNTER — Telehealth: Payer: Self-pay

## 2023-09-24 DIAGNOSIS — K5909 Other constipation: Secondary | ICD-10-CM

## 2023-09-24 NOTE — Telephone Encounter (Signed)
Opened in error

## 2023-09-29 NOTE — Telephone Encounter (Signed)
Patent's daughter asking what is next for patient. Patient was referred to gastroenterology.   Please advise.  Victorino Dike Reiter - [phone]

## 2023-09-30 NOTE — Telephone Encounter (Signed)
Daughter in law called needing clarification if a referral was suppose to be sent to Dr. Sherron Monday. Patient states she was told that one would be sent. Daughter in law did not know why one would be sent and states patient has periods of confusion. Please advise.

## 2023-10-03 DIAGNOSIS — N390 Urinary tract infection, site not specified: Secondary | ICD-10-CM | POA: Diagnosis not present

## 2023-10-06 ENCOUNTER — Telehealth: Payer: Self-pay | Admitting: Family Medicine

## 2023-10-06 NOTE — Telephone Encounter (Signed)
Patient called needs a referral to [facility]. Remo Lipps RN # [phone].

## 2023-10-09 ENCOUNTER — Encounter (INDEPENDENT_AMBULATORY_CARE_PROVIDER_SITE_OTHER): Payer: Self-pay | Admitting: *Deleted

## 2023-10-10 ENCOUNTER — Ambulatory Visit: Payer: PPO | Admitting: Urology

## 2023-10-13 ENCOUNTER — Other Ambulatory Visit: Payer: Self-pay

## 2023-10-13 DIAGNOSIS — E782 Mixed hyperlipidemia: Secondary | ICD-10-CM

## 2023-10-13 DIAGNOSIS — I10 Essential (primary) hypertension: Secondary | ICD-10-CM

## 2023-10-13 NOTE — Telephone Encounter (Signed)
Patient referred.

## 2023-10-14 ENCOUNTER — Ambulatory Visit (INDEPENDENT_AMBULATORY_CARE_PROVIDER_SITE_OTHER): Payer: PPO | Admitting: Family Medicine

## 2023-10-14 ENCOUNTER — Encounter: Payer: Self-pay | Admitting: Family Medicine

## 2023-10-14 VITALS — BP 127/63 | HR 76 | Ht 65.0 in | Wt 126.1 lb

## 2023-10-14 DIAGNOSIS — K219 Gastro-esophageal reflux disease without esophagitis: Secondary | ICD-10-CM

## 2023-10-14 DIAGNOSIS — R351 Nocturia: Secondary | ICD-10-CM

## 2023-10-14 DIAGNOSIS — G3184 Mild cognitive impairment, so stated: Secondary | ICD-10-CM | POA: Diagnosis not present

## 2023-10-14 DIAGNOSIS — R252 Cramp and spasm: Secondary | ICD-10-CM | POA: Diagnosis not present

## 2023-10-14 DIAGNOSIS — F5104 Psychophysiologic insomnia: Secondary | ICD-10-CM | POA: Diagnosis not present

## 2023-10-14 DIAGNOSIS — E782 Mixed hyperlipidemia: Secondary | ICD-10-CM

## 2023-10-14 DIAGNOSIS — I10 Essential (primary) hypertension: Secondary | ICD-10-CM

## 2023-10-14 MED ORDER — IMIPRAMINE HCL 10 MG PO TABS: 10.0000 mg | ORAL_TABLET | Freq: Every day | ORAL | 2 refills | Status: DC

## 2023-10-14 NOTE — Patient Instructions (Signed)
F/U in 8 to 10 weeks, call if you need me sooner, MMSE at visit please  PLEASE get fasting labs already ordered within the next 1 week  Nurse pls add magnesium level to labs dx cramps  NEW for sleep and bedtime urinary frequency is imipramine 10 mg, already sent in, please give mediction 10 to 14 days to be able to appreciate the  difference it is making  Mustard, or magnesium, or tonic water , may all help with cramps  Thanks for choosing [facility], we consider it a privelige to serve you.

## 2023-10-17 ENCOUNTER — Ambulatory Visit (INDEPENDENT_AMBULATORY_CARE_PROVIDER_SITE_OTHER): Payer: PPO | Admitting: Gastroenterology

## 2023-10-17 ENCOUNTER — Encounter (INDEPENDENT_AMBULATORY_CARE_PROVIDER_SITE_OTHER): Payer: Self-pay | Admitting: Gastroenterology

## 2023-10-17 VITALS — BP 124/78 | HR 71 | Temp 97.6°F | Ht 65.0 in | Wt 125.3 lb

## 2023-10-17 DIAGNOSIS — N189 Chronic kidney disease, unspecified: Secondary | ICD-10-CM | POA: Diagnosis not present

## 2023-10-17 DIAGNOSIS — K5900 Constipation, unspecified: Secondary | ICD-10-CM | POA: Diagnosis not present

## 2023-10-17 DIAGNOSIS — K219 Gastro-esophageal reflux disease without esophagitis: Secondary | ICD-10-CM | POA: Diagnosis not present

## 2023-10-17 DIAGNOSIS — N281 Cyst of kidney, acquired: Secondary | ICD-10-CM | POA: Diagnosis not present

## 2023-10-17 DIAGNOSIS — N390 Urinary tract infection, site not specified: Secondary | ICD-10-CM | POA: Diagnosis not present

## 2023-10-17 DIAGNOSIS — K5904 Chronic idiopathic constipation: Secondary | ICD-10-CM

## 2023-10-17 DIAGNOSIS — R252 Cramp and spasm: Secondary | ICD-10-CM | POA: Diagnosis not present

## 2023-10-17 DIAGNOSIS — Z860101 Personal history of adenomatous and serrated colon polyps: Secondary | ICD-10-CM | POA: Diagnosis not present

## 2023-10-17 MED ORDER — OMEPRAZOLE 40 MG PO CPDR
40.0000 mg | DELAYED_RELEASE_CAPSULE | Freq: Every day | ORAL | 3 refills | Status: DC
Start: 2023-10-17 — End: 2024-01-06

## 2023-10-17 NOTE — Progress Notes (Signed)
[name], M.D. Gastroenterology & Hepatology [facility] G[facility]enter[facility] [address] [place], [place] [address]  Primary Care Physician: Kerri Perches, MD [address] [place] [address]  I will communicate my assessment and recommendations to the referring MD via EMR.  Problems: History of chronic idiopathic constipation  History of Present Illness: Kelsey Nelson is a 89 y.o. female with past medical history of GERD, hypertension, hyperlipidemia who comes for follow-up of constipation and GERD.  The patient was last seen on 06/23/2023. At that time, the patient was advised to continue stool softeners at bedtime and to continue with intake of fiber in her diet.  Patient reports having chronic burping and heartburn on a daily basis. She takes omeprazole 20 mg as needed, which is almost on a daily basis. She reports having to take Tums or Rolaids as well. No odynophagia or dysphagia.  She reports having a BM every 2 days. Takes a stool softener every couple days. She states that straining does not help with Bms but she bends over to have a BM. She does not like laxatives and wants to avoid them./  The patient denies having any nausea, vomiting, fever, chills, hematochezia, melena, hematemesis, abdominal distention, abdominal pain, diarrhea, jaundice, pruritus or weight loss.  Last TSH was 3.1 (09/17/2022)  Last Colonoscopy: 07/08/2023 - Hemorrhoids found on perianal exam. - One 15 to 18 mm polyp in the ascending colon, removed piecemeal using a cold snare. Resected and retrieved via EMR. Clips were placed. Clip manufacturer: [employer]. Tattooed. - One 1 mm polyp in the ascending colon, removed with a cold biopsy forceps. Resected and retrieved. - Eight 2 to 8 mm polyps in the transverse colon and in the ascending colon, removed with a cold snare. Resected and retrieved. - Diverticulosis in the sigmoid colon and in the  descending colon. - External and internal hemorrhoids.  Pathology showed all polyps were tubular adenomas (total of 10 polyps, one measuring 18 mm)  Recommended repeat colonoscopy in 9 months  Past Medical History: Past Medical History:  Diagnosis Date   Acute cystitis with hematuria 10/26/2013   Allergic rhinitis 11/08/2017   Blindness of right eye    BMI between 19-24,adult 2010 145 LBS   Diverticula of small intestine Dec 2010   Diverticulosis    [place]   DIVERTICULOSIS, SIGMOID COLON 03/31/2008   Qualifier: Diagnosis of  By: Lind Guest  Noted on abdominal and pelvic scan in 04/2011.  Extensive colonic diverticulosis , and possible muscular hypertrophy at rectosigmoid junction    Family hx of colon cancer 11/09/2013   Functional constipation 2010   GERD (gastroesophageal reflux disease)    HTN (hypertension)    Hyperlipemia    Pancreatic cyst 02/05/2013   Renal cyst 05/27/2011   Right eye trauma 1949   Loss of vision resulted at age 89   Schatzki's ring Dec 2010   Skin lesions, generalized 06/19/2015   Tubular adenoma 11/09/2013    Past Surgical History: Past Surgical History:  Procedure Laterality Date   ABDOMINAL HYSTERECTOMY     BREAST REDUCTION SURGERY  1992 BIL   COLONOSCOPY  DEC 2010   SIMPLE ADENOMAS (6-8), [place] TICS, SML IH   COLONOSCOPY N/A 12/08/2013   Procedure: COLONOSCOPY;  Surgeon: Malissa Hippo, MD;  [facility];  Service: Endoscopy;  Laterality: N/A;  225   COLONOSCOPY WITH PROPOFOL N/A 07/08/2023   Procedure: COLONOSCOPY WITH PROPOFOL;  Surgeon: Dolores Frame, MD;  [facility];  Service: Gastroenterology;  Laterality: N/A;  9:15am; asa 3   EVISCERATION Right 05/08/2015   Procedure: EVISCERATION WITH IMPLANT RIGHT EYE;  Surgeon: Susa Simmonds, MD;  Location: [facility];  Service: Ophthalmology;  Laterality: Right;   EYE SURGERY  RIGHT 2005   EYE SURGERY  right 2016   removed and false eye placed in 08/2015   HEMOSTASIS CLIP  PLACEMENT  07/08/2023   Procedure: HEMOSTASIS CLIP PLACEMENT;  Surgeon: Dolores Frame, MD;  [facility];  Service: Gastroenterology;;   POLYPECTOMY  07/08/2023   Procedure: POLYPECTOMY;  Surgeon: Dolores Frame, MD;  [facility];  Service: Gastroenterology;;   SUBMUCOSAL LIFTING INJECTION  07/08/2023   Procedure: SUBMUCOSAL LIFTING INJECTION;  Surgeon: Dolores Frame, MD;  [facility];  Service: Gastroenterology;;   SUBMUCOSAL TATTOO INJECTION  07/08/2023   Procedure: SUBMUCOSAL TATTOO INJECTION;  Surgeon: Dolores Frame, MD;  [facility];  Service: Gastroenterology;;   TOTAL ABDOMINAL HYSTERECTOMY W/ BILATERAL SALPINGOOPHORECTOMY  1985 FIBROIDS   UPPER GASTROINTESTINAL ENDOSCOPY  DEC 2010   Bx-MILD GASTRITIS/DUODENITIS, DUODENAL DIVERTICULA, SCHATZKI'S RING    Family History: Family History  Problem Relation Age of Onset   Pancreatic cancer Sister    Colon cancer Paternal Aunt    Colon polyps Neg Hx     Social History: Social History   Tobacco Use  Smoking Status Never   Passive exposure: Past  Smokeless Tobacco Never   Social History   Substance and Sexual Activity  Alcohol Use No   Alcohol/week: 0.0 standard drinks of alcohol   Social History   Substance and Sexual Activity  Drug Use No    Allergies: Allergies  Allergen Reactions   Codeine    Crestor [Rosuvastatin] Other (See Comments)    Can't recall   Fosamax [Alendronate Sodium]     heartburn    Medications: Current Outpatient Medications  Medication Sig Dispense Refill   Calcium Carbonate Antacid (TUMS PO) Take 1 tablet by mouth 3 (three) times daily as needed.     Cholecalciferol (VITAMIN D3 PO) Take 25 mcg by mouth daily.     conjugated estrogens (PREMARIN) vaginal cream Place 1 Applicatorful vaginally every other day. 42.5 g 5   diphenhydrAMINE (BENADRYL ALLERGY) 25 mg capsule Take 1 capsule (25 mg total) by  mouth every 6 (six) hours as needed. 30 capsule 0   donepezil (ARICEPT) 10 MG tablet Take 1 tablet (10 mg total) by mouth at bedtime. 30 tablet 3   famotidine (PEPCID) 20 MG tablet Take 1 tablet (20 mg total) by mouth 2 (two) times daily. 60 tablet 3   fexofenadine (ALLEGRA ALLERGY) 180 MG tablet Take 1 tablet (180 mg total) by mouth every morning. 30 tablet 5   fluticasone (FLONASE) 50 MCG/ACT nasal spray Place 1 spray into both nostrils daily. 16 g 5   imipramine (TOFRANIL) 10 MG tablet Take 1 tablet (10 mg total) by mouth at bedtime. 30 tablet 2   Multiple Vitamin (MULTIVITAMIN PO) Take by mouth.     OVER THE COUNTER MEDICATION Multi Collagen Protein - Patient states that she takes 1 scoop a day.     OVER THE COUNTER MEDICATION One daily hylands leg cramp relief     OVER THE COUNTER MEDICATION Omega xl joint and muscle support daily     ramipril (ALTACE) 10 MG capsule Take one capsule by mouth once daily for blood pressure 90 capsule 3   EPINEPHrine (EPIPEN  2-PAK) 0.3 mg/0.3 mL IJ SOAJ injection Inject 0.3 mg into the muscle as needed for anaphylaxis. (Patient not taking: Reported on 10/17/2023) 2 each 1   omeprazole (PRILOSEC) 20 MG capsule Take 1 capsule (20 mg total) by mouth daily. (Patient not taking: Reported on 10/17/2023) 30 capsule 0   XOLAIR 150 MG/ML prefilled syringe Inject 300 mg into the skin every 28 days. (Patient not taking: Reported on 10/17/2023) 2 mL 5   Current Facility-Administered Medications  Medication Dose Route Frequency Provider Last Rate Last Admin   omalizumab Geoffry Paradise) prefilled syringe 300 mg  300 mg Subcutaneous Q28 days Alfonse Spruce, MD   300 mg at 09/15/23 5409    Review of Systems: GENERAL: negative for malaise, night sweats HEENT: No changes in hearing or vision, no nose bleeds or other nasal problems. NECK: Negative for lumps, goiter, pain and significant neck swelling RESPIRATORY: Negative for cough, wheezing CARDIOVASCULAR: Negative for  chest pain, leg swelling, palpitations, orthopnea GI: SEE HPI MUSCULOSKELETAL: Negative for joint pain or swelling, back pain, and muscle pain. SKIN: Negative for lesions, rash PSYCH: Negative for sleep disturbance, mood disorder and recent psychosocial stressors. HEMATOLOGY Negative for prolonged bleeding, bruising easily, and swollen nodes. ENDOCRINE: Negative for cold or heat intolerance, polyuria, polydipsia and goiter. NEURO: negative for tremor, gait imbalance, syncope and seizures. The remainder of the review of systems is noncontributory.   Physical Exam: BP 124/78 (BP Location: Right Arm, Patient Position: Sitting, Cuff Size: Normal)   Pulse 71   Temp 97.6 F (36.4 C) (Oral)   Ht 5\' 5"  (1.651 m)   Wt 125 lb 4.8 oz (56.8 kg)   BMI 20.85 kg/m  GENERAL: The patient is AO x3, in no acute distress. HEENT: Head is normocephalic and atraumatic. EOMI are intact. Mouth is well hydrated and without lesions. NECK: Supple. No masses LUNGS: Clear to auscultation. No presence of rhonchi/wheezing/rales. Adequate chest expansion HEART: RRR, normal s1 and s2. ABDOMEN: Soft, nontender, no guarding, no peritoneal signs, and nondistended. BS +. No masses. EXTREMITIES: Without any cyanosis, clubbing, rash, lesions or edema. NEUROLOGIC: AOx3, no focal motor deficit. SKIN: no jaundice, no rashes  Imaging/Labs: as above  I personally reviewed and interpreted the available labs, imaging and endoscopic files.  Impression and Plan: PALMINA PRZYBYL is a 89 y.o. female with past medical history of GERD, hypertension, hyperlipidemia who comes for follow-up of constipation and GERD.  The patient has presented chronic history of constipation without red flag signs.  Most recent endoscopic evaluation did not show any obstructive lesions explain her constipation.  She has been able to manage her symptoms with use of stool softeners and fiber, which she should continue using for now.  Regarding her  episodes of burping and heartburn, this has been managed with low-dose PPI.  She is having to use this regularly, which has led to partial improvement of her symptoms.  The patient inquired about the possibility of undergoing TIF for management of her ongoing GERD. The patient and I held a thorough discussion about potential nonpharmacologic treatments for reflux such as transoral Incisionless Fundoplication (TIF).  The details of the procedure, benefits and risks, as well as prognosis with this intervention was thoroughly discussed with the patient who understood and agreed.  We discussed in detail that the patient has advanced age, and ideally should try to optimize her PPI management before proceeding with a more invasive procedure such as TIF.  She is in agreement with this.  Will increase her omeprazole  to 40 mg every day for now.  Finally, she was found to have a large polyp during her most recent colonoscopy that required piecemeal polypectomy.  Will repeat a colonoscopy in March 2025.  -Continue eating prunes regularly, try to implement intake of kiwi and dates -Increase omeprazole to 40 mg every day -Repeat colonoscopy in March 2025.  All questions were answered.      [name], MD Gastroenterology and Hepatology [facility] Gastroenterology

## 2023-10-17 NOTE — Patient Instructions (Addendum)
Continue eating prunes regularly, try to implement intake of kiwi and dates Increase omeprazole to 40 mg every day Repeat colonoscopy in March 2025.

## 2023-10-18 LAB — MAGNESIUM: Magnesium: 1.9 mg/dL (ref 1.6–2.3)

## 2023-10-20 ENCOUNTER — Ambulatory Visit: Payer: PPO

## 2023-10-20 ENCOUNTER — Ambulatory Visit: Payer: PPO | Admitting: Internal Medicine

## 2023-10-30 ENCOUNTER — Telehealth: Payer: Self-pay | Admitting: *Deleted

## 2023-10-30 NOTE — Telephone Encounter (Signed)
Contacted regarding PREP Class referral. She will begin class on 11/11/2023 every T/TH from 1330-1445. Intake assessment scheduled for 11/06/2023 at 1215.

## 2023-10-31 DIAGNOSIS — R252 Cramp and spasm: Secondary | ICD-10-CM | POA: Insufficient documentation

## 2023-10-31 NOTE — Assessment & Plan Note (Signed)
Sleep hygiene reviewed and written information offered also. Prescription sent for  medication needed. Trial of low dose imipramine

## 2023-10-31 NOTE — Assessment & Plan Note (Signed)
Hyperlipidemia:Low fat diet discussed and encouraged.   Lipid Panel  Lab Results  Component Value Date   CHOL 288 (H) 01/21/2023   HDL 102 01/21/2023   LDLCALC 168 (H) 01/21/2023   TRIG 106 01/21/2023   CHOLHDL 2.8 01/21/2023     Needs to change diet, lower fat intake

## 2023-10-31 NOTE — Assessment & Plan Note (Signed)
Controlled, no change in medication DASH diet and commitment to daily physical activity for a minimum of 30 minutes discussed and encouraged, as a part of hypertension management. The importance of attaining a healthy weight is also discussed.     10/17/2023    8:19 AM 10/14/2023    2:39 PM 08/27/2023    1:40 PM 08/27/2023    1:06 PM 08/16/2023    9:06 AM 08/15/2023   10:41 AM 08/15/2023   10:38 AM  BP/Weight  Systolic BP 124 127 128 111 127 138 147  Diastolic BP 78 63 82 71 82 68 88  Wt. (Lbs) 125.3 126.08  124.12   125  BMI 20.85 kg/m2 20.98 kg/m2  20.65 kg/m2   20.48 kg/m2

## 2023-10-31 NOTE — Assessment & Plan Note (Signed)
Trial of low dose bedtime imipramine and behavior modification

## 2023-10-31 NOTE — Assessment & Plan Note (Signed)
Check magnesium level, increase water intake, do daily muscle stretches, trial of mustard, tonic water and /or magnesium, lab for mg and potassium and calcium levels

## 2023-10-31 NOTE — Assessment & Plan Note (Signed)
Continue aricept at treating dose , has tolerated will re assess MMSE next visit

## 2023-10-31 NOTE — Assessment & Plan Note (Signed)
Controlled, no change in medication  

## 2023-10-31 NOTE — Progress Notes (Signed)
Kelsey Nelson     MRN: [id number]      DOB: 12-31-1933  Chief Complaint  Patient presents with   Follow-up    Follow up    HPI Ms. Zawadzki is here for follow up and re-evaluation of chronic medical conditions, medication management and review of any available recent lab and radiology data.  Preventive health is updated, specifically  Cancer screening and Immunization.   Questions or concerns regarding consultations or procedures which the PT has had in the interim are  addressed. The PT denies any adverse reactions to current medications since the last visit.  Reports tolerance and no adverse s/e on aricept, glad to be on it and feels it is helping her as she has real concerns re memory loss, also organizing things in her home to help with this also Still has poor sleep and reports nocturia, ahs tried almost every available medication more than once oin the past 10 years nothing has worked   ROS Denies recent fever or chills. Denies sinus pressure, nasal congestion, ear pain or sore throat. Denies chest congestion, productive cough or wheezing. Denies chest pains, palpitations and leg swelling Denies abdominal pain, nausea, vomiting,diarrhea or constipation.   . Denies joint pain, swelling and limitation in mobility. Denies headaches, seizures, numbness, or tingling. Denies depression, anxiety or insomnia. Denies skin break down or rash.   PE  BP 127/63 (BP Location: Left Arm, Patient Position: Sitting, Cuff Size: Normal)   Pulse 76   Ht 5\' 5"  (1.651 m)   Wt 126 lb 1.3 oz (57.2 kg)   SpO2 95%   BMI 20.98 kg/m   Patient alert and oriented and in no cardiopulmonary distress.  HEENT: No facial asymmetry, EOMI,     Neck supple .  Chest: Clear to auscultation bilaterally.  CVS: S1, S2 no murmurs, no S3.Regular rate.  ABD: Soft non tender.   Ext: No edema  MS: Adequate ROM spine, shoulders, hips and knees.  Skin: Intact, no ulcerations or rash noted.  Psych: Good eye  contact, normal affect. Memory intact not anxious or depressed appearing.  CNS: CN 2-12 intact, power,  normal throughout.no focal deficits noted.   Assessment & Plan  HTN (hypertension) Controlled, no change in medication DASH diet and commitment to daily physical activity for a minimum of 30 minutes discussed and encouraged, as a part of hypertension management. The importance of attaining a healthy weight is also discussed.     10/17/2023    8:19 AM 10/14/2023    2:39 PM 08/27/2023    1:40 PM 08/27/2023    1:06 PM 08/16/2023    9:06 AM 08/15/2023   10:41 AM 08/15/2023   10:38 AM  BP/Weight  Systolic BP 124 127 128 111 127 138 147  Diastolic BP 78 63 82 71 82 68 88  Wt. (Lbs) 125.3 126.08  124.12   125  BMI 20.85 kg/m2 20.98 kg/m2  20.65 kg/m2   20.48 kg/m2       MCI (mild cognitive impairment) Continue aricept at treating dose , has tolerated will re assess MMSE next visit  Mixed hyperlipidemia Hyperlipidemia:Low fat diet discussed and encouraged.   Lipid Panel  Lab Results  Component Value Date   CHOL 288 (H) 01/21/2023   HDL 102 01/21/2023   LDLCALC 168 (H) 01/21/2023   TRIG 106 01/21/2023   CHOLHDL 2.8 01/21/2023     Needs to change diet, lower fat intake  Nocturia Trial of low dose bedtime imipramine and behavior  modification  Insomnia Sleep hygiene reviewed and written information offered also. Prescription sent for  medication needed. Trial of low dose imipramine  GERD Controlled, no change in medication   Muscle cramps Check magnesium level, increase water intake, do daily muscle stretches, trial of mustard, tonic water and /or magnesium, lab for mg and potassium and calcium levels

## 2023-11-07 ENCOUNTER — Encounter: Payer: Self-pay | Admitting: *Deleted

## 2023-11-07 NOTE — Progress Notes (Signed)
[facility] PREP Evaluation  Patient Details  Name: Kelsey Nelson MRN: [id number] Date of Birth: 1934/02/23 Age: 89 y.o. PCP: Kerri Perches, MD  Vitals:   11/06/23 1300  BP: (!) 122/58  Pulse: 73  Resp: 20  SpO2: 98%  Weight: 126 lb 9.6 oz (57.4 kg)     [facility] Eval - 11/06/23 1300       [facility] "PREP" Location   [facility] "PREP" Location [facility]      Referral    Referring Provider Lodema Hong    Reason for referral Hypertension    Program Start Date 11/11/23    Program End Date 02/05/24      Measurement   Waist Circumference 32 inches    Hip Circumference 35 inches    Body fat 35.5 percent      Information for Trainer   Goals Strength training, healthier diet and lifestyle    Current Exercise walking    Orthopedic Concerns Hx left knee injury    Pertinent Medical History HTN    Current Barriers none identified      Mobility and Daily Activities   I find it easy to walk up or down two or more flights of stairs. 4    I have no trouble taking out the trash. 4    I do housework such as vacuuming and dusting on my own without difficulty. 4    I can easily lift a gallon of milk (8lbs). 4    I can easily walk a mile. 4    I have no trouble reaching into high cupboards or reaching down to pick up something from the floor. 4    I do not have trouble doing out-door work such as Loss adjuster, chartered, raking leaves, or gardening. 4      Mobility and Daily Activities   I feel younger than my age. 4    I feel independent. 4    I feel energetic. 3    I live an active life.  4    I feel strong. 3    I feel healthy. 4    I feel active as other people my age. 4      How fit and strong are you.   Fit and Strong Total Score 54            Past Medical History:  Diagnosis Date   Acute cystitis with hematuria 10/26/2013   Allergic rhinitis 11/08/2017   Blindness of right eye    BMI between 19-24,adult 2010 145 LBS   Diverticula of small intestine Dec 2010   Diverticulosis     [employer]   DIVERTICULOSIS, SIGMOID COLON 03/31/2008   Qualifier: Diagnosis of  By: Lind Guest  Noted on abdominal and pelvic scan in 04/2011.  Extensive colonic diverticulosis , and possible muscular hypertrophy at rectosigmoid junction    Family hx of colon cancer 11/09/2013   Functional constipation 2010   GERD (gastroesophageal reflux disease)    HTN (hypertension)    Hyperlipemia    Pancreatic cyst 02/05/2013   Renal cyst 05/27/2011   Right eye trauma 1949   Loss of vision resulted at age 34   Schatzki's ring Dec 2010   Skin lesions, generalized 06/19/2015   Tubular adenoma 11/09/2013   Past Surgical History:  Procedure Laterality Date   ABDOMINAL HYSTERECTOMY     BREAST REDUCTION SURGERY  1992 BIL   COLONOSCOPY  DEC 2010   SIMPLE ADENOMAS (6-8), [place] TICS, SML IH  COLONOSCOPY N/A 12/08/2013   Procedure: COLONOSCOPY;  Surgeon: Malissa Hippo, MD;  [facility];  Service: Endoscopy;  Laterality: N/A;  225   COLONOSCOPY WITH PROPOFOL N/A 07/08/2023   Procedure: COLONOSCOPY WITH PROPOFOL;  Surgeon: Dolores Frame, MD;  [facility];  Service: Gastroenterology;  Laterality: N/A;  9:15am; asa 3   EVISCERATION Right 05/08/2015   Procedure: EVISCERATION WITH IMPLANT RIGHT EYE;  Surgeon: Susa Simmonds, MD;  [facility];  Service: Ophthalmology;  Laterality: Right;   EYE SURGERY  RIGHT 2005   EYE SURGERY  right 2016   removed and false eye placed in 08/2015   HEMOSTASIS CLIP PLACEMENT  07/08/2023   Procedure: HEMOSTASIS CLIP PLACEMENT;  Surgeon: Dolores Frame, MD;  [facility];  Service: Gastroenterology;;   POLYPECTOMY  07/08/2023   Procedure: POLYPECTOMY;  Surgeon: Dolores Frame, MD;  [facility];  Service: Gastroenterology;;   SUBMUCOSAL LIFTING INJECTION  07/08/2023   Procedure: SUBMUCOSAL LIFTING INJECTION;  Surgeon: Dolores Frame, MD;  [facility];  Service: Gastroenterology;;    SUBMUCOSAL TATTOO INJECTION  07/08/2023   Procedure: SUBMUCOSAL TATTOO INJECTION;  Surgeon: Dolores Frame, MD;  [facility];  Service: Gastroenterology;;   TOTAL ABDOMINAL HYSTERECTOMY W/ BILATERAL SALPINGOOPHORECTOMY  1985 FIBROIDS   UPPER GASTROINTESTINAL ENDOSCOPY  DEC 2010   Bx-MILD GASTRITIS/DUODENITIS, DUODENAL DIVERTICULA, SCHATZKI'S RING   Social History   Tobacco Use  Smoking Status Never   Passive exposure: Past  Smokeless Tobacco Never    Remo Lipps 11/07/2023, 8:06 AM  PREP Class beginning 11/11/2023. No barriers to participation noted.

## 2023-11-12 ENCOUNTER — Encounter: Payer: Self-pay | Admitting: *Deleted

## 2023-11-12 NOTE — Progress Notes (Signed)
[facility] PREP Weekly Session  Patient Details  Name: Kelsey Nelson MRN: [id number] Date of Birth: Oct 19, 1934 Age: 89 y.o. PCP: Kerri Perches, MD  There were no vitals filed for this visit.   [facility] Weekly seesion - 11/11/23 1500       [facility] "PREP" Location   [facility] "PREP" Location [facility]      Weekly Session   Topic Discussed Goal setting and welcome to the program   Introductions, text book review, perceived exertion scale, tour facility, homework: not sitting longer than 30 minutes.   Classes attended to date 1             Remo Lipps 11/12/2023, 11:23 AM

## 2023-11-17 ENCOUNTER — Ambulatory Visit (INDEPENDENT_AMBULATORY_CARE_PROVIDER_SITE_OTHER): Payer: Medicare PPO | Admitting: Gastroenterology

## 2023-11-18 ENCOUNTER — Encounter: Payer: Self-pay | Admitting: *Deleted

## 2023-11-18 DIAGNOSIS — N281 Cyst of kidney, acquired: Secondary | ICD-10-CM | POA: Diagnosis not present

## 2023-11-18 DIAGNOSIS — N390 Urinary tract infection, site not specified: Secondary | ICD-10-CM | POA: Diagnosis not present

## 2023-11-18 NOTE — Progress Notes (Signed)
[facility] PREP Weekly Session  Patient Details  Name: Kelsey Nelson MRN: [id number] Date of Birth: 09-07-1934 Age: 89 y.o. PCP: Kerri Perches, MD  Vitals:   11/17/23 1330  Weight: 126 lb 3.2 oz (57.2 kg)     [facility] Weekly seesion - 11/17/23 1500       [facility] "PREP" Location   [facility] "PREP" Location [facility]      Weekly Session   Topic Discussed Other ways to be active;Importance of resistance training   Discussed national standards:150 minutes of cardio/week, strength training 2-4 times/week for 20-40 minute sessions. Balance work and flexibility.   Classes attended to date 3             Remo Lipps 11/18/2023, 7:53 AM

## 2023-11-25 ENCOUNTER — Encounter: Payer: Self-pay | Admitting: *Deleted

## 2023-11-25 NOTE — Progress Notes (Signed)
[facility] PREP Weekly Session  Patient Details  Name: HALLY DEUTMEYER MRN: [id number] Date of Birth: Aug 22, 1934 Age: 89 y.o. PCP: Kerri Perches, MD  Vitals:   11/25/23 1330  Weight: 128 lb 11.2 oz (58.4 kg)     [facility] Weekly seesion - 11/25/23 1500       [facility] "PREP" Location   [facility] "PREP" Location [facility]      Weekly Session   Topic Discussed Healthy eating tips   Discussed components of healthy diet and macronutrients; fats, carbohydrates, proteins. Added sugars 24gms/day for women, 36gms/day for men. Limit salt intake to 1500-2300mg  per day.   Minutes exercised this week 630 minutes    Classes attended to date 4             Remo Lipps 11/25/2023, 3:44 PM

## 2023-11-26 DIAGNOSIS — R1084 Generalized abdominal pain: Secondary | ICD-10-CM | POA: Diagnosis not present

## 2023-11-26 DIAGNOSIS — N281 Cyst of kidney, acquired: Secondary | ICD-10-CM | POA: Diagnosis not present

## 2023-11-26 DIAGNOSIS — N2889 Other specified disorders of kidney and ureter: Secondary | ICD-10-CM | POA: Diagnosis not present

## 2023-11-26 DIAGNOSIS — Z960 Presence of urogenital implants: Secondary | ICD-10-CM | POA: Diagnosis not present

## 2023-11-26 DIAGNOSIS — Z90710 Acquired absence of both cervix and uterus: Secondary | ICD-10-CM | POA: Diagnosis not present

## 2023-11-28 ENCOUNTER — Encounter: Payer: Self-pay | Admitting: Urology

## 2023-11-28 ENCOUNTER — Ambulatory Visit: Payer: PPO | Admitting: Urology

## 2023-11-28 VITALS — BP 175/79 | HR 72

## 2023-11-28 DIAGNOSIS — N390 Urinary tract infection, site not specified: Secondary | ICD-10-CM

## 2023-11-28 DIAGNOSIS — N3281 Overactive bladder: Secondary | ICD-10-CM

## 2023-11-28 DIAGNOSIS — Z87440 Personal history of urinary (tract) infections: Secondary | ICD-10-CM

## 2023-11-28 LAB — MICROSCOPIC EXAMINATION: Bacteria, UA: NONE SEEN

## 2023-11-28 LAB — URINALYSIS, ROUTINE W REFLEX MICROSCOPIC
Bilirubin, UA: NEGATIVE
Glucose, UA: NEGATIVE
Ketones, UA: NEGATIVE
Nitrite, UA: NEGATIVE
Protein,UA: NEGATIVE
RBC, UA: NEGATIVE
Specific Gravity, UA: 1.005 (ref 1.005–1.030)
Urobilinogen, Ur: 0.2 mg/dL (ref 0.2–1.0)
pH, UA: 6.5 (ref 5.0–7.5)

## 2023-11-28 MED ORDER — FLUCONAZOLE 150 MG PO TABS
150.0000 mg | ORAL_TABLET | Freq: Every day | ORAL | 0 refills | Status: DC
Start: 2023-11-28 — End: 2023-12-09

## 2023-11-28 NOTE — Patient Instructions (Signed)
Urinary Tract Infection, Adult  A urinary tract infection (UTI) is an infection of any part of the urinary tract. The urinary tract includes the kidneys, ureters, bladder, and urethra. These organs make, store, and get rid of urine in the body. An upper UTI affects the ureters and kidneys. A lower UTI affects the bladder and urethra. What are the causes? Most urinary tract infections are caused by bacteria in your genital area around your urethra, where urine leaves your body. These bacteria grow and cause inflammation of your urinary tract. What increases the risk? You are more likely to develop this condition if: You have a urinary catheter that stays in place. You are not able to control when you urinate or have a bowel movement (incontinence). You are female and you: Use a spermicide or diaphragm for birth control. Have low estrogen levels. Are pregnant. You have certain genes that increase your risk. You are sexually active. You take antibiotic medicines. You have a condition that causes your flow of urine to slow down, such as: An enlarged prostate, if you are female. Blockage in your urethra. A kidney stone. A nerve condition that affects your bladder control (neurogenic bladder). Not getting enough to drink, or not urinating often. You have certain medical conditions, such as: Diabetes. A weak disease-fighting system (immunesystem). Sickle cell disease. Gout. Spinal cord injury. What are the signs or symptoms? Symptoms of this condition include: Needing to urinate right away (urgency). Frequent urination. This may include small amounts of urine each time you urinate. Pain or burning with urination. Blood in the urine. Urine that smells bad or unusual. Trouble urinating. Cloudy urine. Vaginal discharge, if you are female. Pain in the abdomen or the lower back. You may also have: Vomiting or a decreased appetite. Confusion. Irritability or tiredness. A fever or  chills. Diarrhea. The first symptom in older adults may be confusion. In some cases, they may not have any symptoms until the infection has worsened. How is this diagnosed? This condition is diagnosed based on your medical history and a physical exam. You may also have other tests, including: Urine tests. Blood tests. Tests for STIs (sexually transmitted infections). If you have had more than one UTI, a cystoscopy or imaging studies may be done to determine the cause of the infections. How is this treated? Treatment for this condition includes: Antibiotic medicine. Over-the-counter medicines to treat discomfort. Drinking enough water to stay hydrated. If you have frequent infections or have other conditions such as a kidney stone, you may need to see a health care provider who specializes in the urinary tract (urologist). In rare cases, urinary tract infections can cause sepsis. Sepsis is a life-threatening condition that occurs when the body responds to an infection. Sepsis is treated in the hospital with IV antibiotics, fluids, and other medicines. Follow these instructions at home:  Medicines Take over-the-counter and prescription medicines only as told by your health care provider. If you were prescribed an antibiotic medicine, take it as told by your health care provider. Do not stop using the antibiotic even if you start to feel better. General instructions Make sure you: Empty your bladder often and completely. Do not hold urine for long periods of time. Empty your bladder after sex. Wipe from front to back after urinating or having a bowel movement if you are female. Use each tissue only one time when you wipe. Drink enough fluid to keep your urine pale yellow. Keep all follow-up visits. This is important. Contact a health   care provider if: Your symptoms do not get better after 1-2 days. Your symptoms go away and then return. Get help right away if: You have severe pain in  your back or your lower abdomen. You have a fever or chills. You have nausea or vomiting. Summary A urinary tract infection (UTI) is an infection of any part of the urinary tract, which includes the kidneys, ureters, bladder, and urethra. Most urinary tract infections are caused by bacteria in your genital area. Treatment for this condition often includes antibiotic medicines. If you were prescribed an antibiotic medicine, take it as told by your health care provider. Do not stop using the antibiotic even if you start to feel better. Keep all follow-up visits. This is important. This information is not intended to replace advice given to you by your health care provider. Make sure you discuss any questions you have with your health care provider. Document Revised: 07/16/2020 Document Reviewed: 07/21/2020 Elsevier Patient Education  2024 Elsevier Inc.  

## 2023-11-28 NOTE — Progress Notes (Unsigned)
11/28/2023 12:22 PM   Kelsey Nelson 01-Mar-1934 [id number]  Referring provider: Kerri Perches, MD [address], [address] [place],  [place] [address]  Followup OAB and UTI   HPI: Kelsey Nelson is a 89yo here for followup for frequent UTI and OAB. UA today is normal. She has had vaginal irritation and itching for the past several weeks. She remains on premarin cream 3x per week. She saw Coralyn Pear, MD at [facility] and was fitted with a pessary. She is off hiprex for the past 3 months. No UTIs since last visit.    PMH: Past Medical History:  Diagnosis Date   Acute cystitis with hematuria 10/26/2013   Allergic rhinitis 11/08/2017   Blindness of right eye    BMI between 19-24,adult 2010 145 LBS   Diverticula of small intestine Dec 2010   Diverticulosis    [place]   DIVERTICULOSIS, SIGMOID COLON 03/31/2008   Qualifier: Diagnosis of  By: Lind Guest  Noted on abdominal and pelvic scan in 04/2011.  Extensive colonic diverticulosis , and possible muscular hypertrophy at rectosigmoid junction    Family hx of colon cancer 11/09/2013   Functional constipation 2010   GERD (gastroesophageal reflux disease)    HTN (hypertension)    Hyperlipemia    Pancreatic cyst 02/05/2013   Renal cyst 05/27/2011   Right eye trauma 1949   Loss of vision resulted at age 15   Schatzki's ring Dec 2010   Skin lesions, generalized 06/19/2015   Tubular adenoma 11/09/2013    Surgical History: Past Surgical History:  Procedure Laterality Date   ABDOMINAL HYSTERECTOMY     BREAST REDUCTION SURGERY  1992 BIL   COLONOSCOPY  DEC 2010   SIMPLE ADENOMAS (6-8),  TICS, SML IH   COLONOSCOPY N/A 12/08/2013   Procedure: COLONOSCOPY;  Surgeon: Malissa Hippo, MD;  [facility];  Service: Endoscopy;  Laterality: N/A;  225   COLONOSCOPY WITH PROPOFOL N/A 07/08/2023   Procedure: COLONOSCOPY WITH PROPOFOL;  Surgeon: Dolores Frame, MD;  Location: [facility];  Service: Gastroenterology;   Laterality: N/A;  9:15am; asa 3   EVISCERATION Right 05/08/2015   Procedure: EVISCERATION WITH IMPLANT RIGHT EYE;  Surgeon: Susa Simmonds, MD;  Location: [facility];  Service: Ophthalmology;  Laterality: Right;   EYE SURGERY  RIGHT 2005   EYE SURGERY  right 2016   removed and false eye placed in 08/2015   HEMOSTASIS CLIP PLACEMENT  07/08/2023   Procedure: HEMOSTASIS CLIP PLACEMENT;  Surgeon: Dolores Frame, MD;  [facility];  Service: Gastroenterology;;   POLYPECTOMY  07/08/2023   Procedure: POLYPECTOMY;  Surgeon: Dolores Frame, MD;  [facility];  Service: Gastroenterology;;   SUBMUCOSAL LIFTING INJECTION  07/08/2023   Procedure: SUBMUCOSAL LIFTING INJECTION;  Surgeon: Dolores Frame, MD;  [facility];  Service: Gastroenterology;;   SUBMUCOSAL TATTOO INJECTION  07/08/2023   Procedure: SUBMUCOSAL TATTOO INJECTION;  Surgeon: Dolores Frame, MD;  [facility];  Service: Gastroenterology;;   TOTAL ABDOMINAL HYSTERECTOMY W/ BILATERAL SALPINGOOPHORECTOMY  1985 FIBROIDS   UPPER GASTROINTESTINAL ENDOSCOPY  DEC 2010   Bx-MILD GASTRITIS/DUODENITIS, DUODENAL DIVERTICULA, SCHATZKI'S RING    Home Medications:  Allergies as of 11/28/2023       Reactions   Codeine    Crestor [rosuvastatin] Other (See Comments)   Can't recall   Fosamax [alendronate Sodium]    heartburn        Medication List  Accurate as of November 28, 2023 12:22 PM. If you have any questions, ask your nurse or doctor.          conjugated estrogens vaginal cream Commonly known as: PREMARIN Place 1 Applicatorful vaginally every other day.   diphenhydrAMINE 25 mg capsule Commonly known as: Benadryl Allergy Take 1 capsule (25 mg total) by mouth every 6 (six) hours as needed.   donepezil 10 MG tablet Commonly known as: ARICEPT Take 1 tablet (10 mg total) by mouth at bedtime.   EPINEPHrine 0.3 mg/0.3 mL Soaj injection Commonly  known as: EpiPen 2-Pak Inject 0.3 mg into the muscle as needed for anaphylaxis.   famotidine 20 MG tablet Commonly known as: PEPCID Take 1 tablet (20 mg total) by mouth 2 (two) times daily.   fexofenadine 180 MG tablet Commonly known as: Allegra Allergy Take 1 tablet (180 mg total) by mouth every morning.   fluticasone 50 MCG/ACT nasal spray Commonly known as: FLONASE Place 1 spray into both nostrils daily.   imipramine 10 MG tablet Commonly known as: TOFRANIL Take 1 tablet (10 mg total) by mouth at bedtime.   MULTIVITAMIN PO Take by mouth.   omeprazole 40 MG capsule Commonly known as: PRILOSEC Take 1 capsule (40 mg total) by mouth daily.   OVER THE COUNTER MEDICATION Multi Collagen Protein - Patient states that she takes 1 scoop a day.   OVER THE COUNTER MEDICATION One daily hylands leg cramp relief   OVER THE COUNTER MEDICATION Omega xl joint and muscle support daily   ramipril 10 MG capsule Commonly known as: ALTACE Take one capsule by mouth once daily for blood pressure   TUMS PO Take 1 tablet by mouth 3 (three) times daily as needed.   VITAMIN D3 PO Take 25 mcg by mouth daily.   Xolair 150 MG/ML prefilled syringe Generic drug: omalizumab Inject 300 mg into the skin every 28 days.        Allergies:  Allergies  Allergen Reactions   Codeine    Crestor [Rosuvastatin] Other (See Comments)    Can't recall   Fosamax [Alendronate Sodium]     heartburn    Family History: Family History  Problem Relation Age of Onset   Pancreatic cancer Sister    Colon cancer Paternal Aunt    Colon polyps Neg Hx     Social History:  reports that she has never smoked. She has been exposed to tobacco smoke. She has never used smokeless tobacco. She reports that she does not drink alcohol and does not use drugs.  ROS: All other review of systems were reviewed and are negative except what is noted above in HPI  Physical Exam: BP (!) 175/79   Pulse 72    Constitutional:  Alert and oriented, No acute distress. HEENT: [place] AT, moist mucus membranes.  Trachea midline, no masses. Cardiovascular: No clubbing, cyanosis, or edema. Respiratory: Normal respiratory effort, no increased work of breathing. GI: Abdomen is soft, nontender, nondistended, no abdominal masses GU: No CVA tenderness.  Lymph: No cervical or inguinal lymphadenopathy. Skin: No rashes, bruises or suspicious lesions. Neurologic: Grossly intact, no focal deficits, moving all 4 extremities. Psychiatric: Normal mood and affect.  Laboratory Data: Lab Results  Component Value Date   WBC 5.5 09/17/2022   HGB 13.6 09/17/2022   HCT 39.7 09/17/2022   MCV 91 09/17/2022   PLT 228 09/17/2022    Lab Results  Component Value Date   CREATININE 1.04 (H) 06/11/2023    No results found  for: "PSA"  No results found for: "TESTOSTERONE"  Lab Results  Component Value Date   HGBA1C 5.6 09/03/2016    Urinalysis    Component Value Date/Time   COLORURINE STRAW (A) 12/04/2018 1058   APPEARANCEUR Cloudy (A) 01/08/2023 0940   LABSPEC 1.003 (L) 12/04/2018 1058   PHURINE 7.0 12/04/2018 1058   GLUCOSEU Negative 01/08/2023 0940   HGBUR SMALL (A) 12/04/2018 1058   BILIRUBINUR Negative 01/08/2023 0940   KETONESUR negative 01/22/2022 1316   KETONESUR NEGATIVE 12/04/2018 1058   PROTEINUR Negative 01/08/2023 0940   PROTEINUR NEGATIVE 12/04/2018 1058   UROBILINOGEN 0.2 01/22/2022 1316   UROBILINOGEN 0.2 07/14/2009 2018   NITRITE Positive (A) 01/08/2023 0940   NITRITE NEGATIVE 12/04/2018 1058   LEUKOCYTESUR 2+ (A) 01/08/2023 0940    Lab Results  Component Value Date   LABMICR See below: 01/08/2023   WBCUA 6-10 (A) 01/08/2023   LABEPIT 0-10 01/08/2023   BACTERIA Many (A) 01/08/2023    Pertinent Imaging:  Results for orders placed during the hospital encounter of 07/31/12  DG Abd 1 View  Narrative *RADIOLOGY REPORT*  Clinical Data: History of constipation.  Abdominal  pain.  ABDOMEN - 1 VIEW  Comparison: 06/21/2011  Findings: Moderate stool burden within the colon, slightly decreased in the left colon since prior study.  No evidence of bowel obstruction.  No free air.  No organomegaly or acute bony abnormality.  Calcified phleboliths in the anatomic pelvis.  IMPRESSION: Moderate stool burden, decreased from prior study.  No acute findings.  Original Report Authenticated By: Cyndie Chime, M.D.  No results found for this or any previous visit.  No results found for this or any previous visit.  No results found for this or any previous visit.  No results found for this or any previous visit.  No valid procedures specified. No results found for this or any previous visit.  No results found for this or any previous visit.   Assessment & Plan:    1. OAB (overactive bladder) -continue premarin 3x per week. - Urinalysis, Routine w reflex microscopic  2. Urinary tract infection without hematuria, site unspecified -continue observation.    No follow-ups on file.  Wilkie Aye, MD  [facility] [place]

## 2023-12-01 ENCOUNTER — Telehealth: Payer: Self-pay | Admitting: Urology

## 2023-12-01 MED ORDER — FLUCONAZOLE 100 MG PO TABS: 100.0000 mg | ORAL_TABLET | Freq: Every day | ORAL | 0 refills | Status: DC

## 2023-12-01 NOTE — Telephone Encounter (Signed)
Pt called and notified of a new Rx sent in per verbal with Dr. Ronne Binning

## 2023-12-01 NOTE — Telephone Encounter (Signed)
Patient was given medication Friday and it helped for a day but now she is worse. She would like a different medication.

## 2023-12-02 ENCOUNTER — Encounter: Payer: Self-pay | Admitting: *Deleted

## 2023-12-02 NOTE — Progress Notes (Signed)
[facility] PREP Weekly Session  Patient Details  Name: Kelsey Nelson MRN: [id number] Date of Birth: [birth year] Age: 89 y.o. PCP: Kerri Perches, MD  Vitals:   12/02/23 1330  Weight: 126 lb 3.2 oz (57.2 kg)     [facility] Weekly seesion - 12/02/23 1500       [facility] "PREP" Location   [facility] "PREP" Location [facility]      Weekly Session   Topic Discussed Health habits;Water   Harmful effects of sugar, ways to reduce dietary sugars, sugar demo   Minutes exercised this week 365 minutes    Classes attended to date 6             Remo Lipps 12/02/2023, 3:48 PM

## 2023-12-08 ENCOUNTER — Telehealth: Payer: Self-pay

## 2023-12-08 NOTE — Telephone Encounter (Signed)
Patient needing a refill on pill form of medication that Dr. Ronne Binning prescribes for yeast infections.  URGENT  Please fill at  [facility] [place], [place] - [address] Phone: [phone]  Fax: [phone]

## 2023-12-09 ENCOUNTER — Ambulatory Visit: Payer: PPO | Admitting: Adult Health

## 2023-12-09 ENCOUNTER — Encounter: Payer: Self-pay | Admitting: Adult Health

## 2023-12-09 ENCOUNTER — Other Ambulatory Visit (HOSPITAL_COMMUNITY)
Admission: RE | Admit: 2023-12-09 | Discharge: 2023-12-09 | Disposition: A | Discharge status: Home / Self Care | Payer: PPO | Source: Ambulatory Visit | Attending: Adult Health | Admitting: Adult Health

## 2023-12-09 ENCOUNTER — Encounter: Payer: Self-pay | Admitting: *Deleted

## 2023-12-09 VITALS — BP 127/75 | HR 72 | Ht 65.5 in | Wt 124.0 lb

## 2023-12-09 DIAGNOSIS — N8110 Cystocele, unspecified: Secondary | ICD-10-CM

## 2023-12-09 DIAGNOSIS — B3731 Acute candidiasis of vulva and vagina: Secondary | ICD-10-CM | POA: Insufficient documentation

## 2023-12-09 DIAGNOSIS — N760 Acute vaginitis: Secondary | ICD-10-CM | POA: Diagnosis not present

## 2023-12-09 DIAGNOSIS — B9689 Other specified bacterial agents as the cause of diseases classified elsewhere: Secondary | ICD-10-CM | POA: Insufficient documentation

## 2023-12-09 DIAGNOSIS — L900 Lichen sclerosus et atrophicus: Secondary | ICD-10-CM

## 2023-12-09 DIAGNOSIS — Z90710 Acquired absence of both cervix and uterus: Secondary | ICD-10-CM

## 2023-12-09 DIAGNOSIS — Z90721 Acquired absence of ovaries, unilateral: Secondary | ICD-10-CM | POA: Diagnosis not present

## 2023-12-09 DIAGNOSIS — L292 Pruritus vulvae: Secondary | ICD-10-CM | POA: Diagnosis not present

## 2023-12-09 DIAGNOSIS — N819 Female genital prolapse, unspecified: Secondary | ICD-10-CM | POA: Diagnosis not present

## 2023-12-09 DIAGNOSIS — Z4689 Encounter for fitting and adjustment of other specified devices: Secondary | ICD-10-CM | POA: Diagnosis not present

## 2023-12-09 DIAGNOSIS — N898 Other specified noninflammatory disorders of vagina: Secondary | ICD-10-CM | POA: Insufficient documentation

## 2023-12-09 MED ORDER — CLOBETASOL PROPIONATE 0.05 % EX OINT: TOPICAL_OINTMENT | CUTANEOUS | 1 refills | Status: DC

## 2023-12-09 MED ORDER — FLUCONAZOLE 150 MG PO TABS: 150.0000 mg | ORAL_TABLET | Freq: Every day | ORAL | 3 refills | Status: DC

## 2023-12-09 NOTE — Telephone Encounter (Signed)
Sent to pharmacy and patient notified, she states that she is seeing her GYN today and will get the rx from pharmacy if still needed after visit.

## 2023-12-09 NOTE — Progress Notes (Addendum)
  Subjective:     Patient ID: Kelsey Nelson, female   DOB: 1934/08/28, 89 y.o.   MRN: [id number]  HPI Kelsey Nelson is a 89 year old white female, married, sp hysterectomy in complaining of vulva and vaginal itching for over a month, diflucan did not help. She has history of UTI and is using PVC. She had pessary placed by Dr Malen Gauze several months ago, and that helps, the feeling of something falling out. She is active, walks 3 miles a day.   PCP is Dr Lodema Hong  Review of Systems  vulva and vaginal itching for over a month, diflucan did not help Does not sleep well for years now Reviewed past medical,surgical, social and family history. Reviewed medications and allergies.     Objective:   Physical Exam BP 127/75 (BP Location: Right Arm, Patient Position: Sitting, Cuff Size: Normal)   Pulse 72   Ht 5' 5.5" (1.664 m)   Wt 124 lb (56.2 kg)   BMI 20.32 kg/m     Skin warm and dry.Pelvic: external genitalia:vulva is red, thin tissue at introitus, vagina: #2 ring with support removed, and washed with soap and water and dried, CV swab obtained, +cystocele and prolapse,urethra has no lesions or masses noted, cervix and uterus are absent, adnexa: no masses or tenderness noted. Bladder is non tender and no masses felt. AA is 0 Fall risk is low Refused PHQ 9 and GAD 7  Upstream - 12/09/23 1444       Pregnancy Intention Screening   Does the patient want to become pregnant in the next year? N/A    Does the patient's partner want to become pregnant in the next year? N/A    Would the patient like to discuss contraceptive options today? N/A      Contraception Wrap Up   Current Method Female Sterilization   hyst   End Method Female Sterilization    Contraception Counseling Provided No            Examination chaperoned by Freddie Apley RN  Assessment:     1. Vulvar itching (Primary) Tissue red, has had itching over a month Diflucan did not help CV swab sent for BV and itching  - Cervicovaginal  ancillary only( [place])  2. Vaginal itching CV swab sent  - Cervicovaginal ancillary only( [place])  3. S/P hysterectomy with oophorectomy  4. Cystocele, unspecified  5. Vaginal vault prolapse  6. Pessary maintenance Cleaned and reinserted   7. Lichen sclerosus et atrophicus Will rx temovate Meds ordered this encounter  Medications   clobetasol ointment (TEMOVATE) 0.05 %    Sig: Use bid x 2 weeks then 2 x weekly on external tissue of vulva    Dispense:  30 g    Refill:  1    Supervising Provider:   Lazaro Arms [[id number]]   Review handout      Plan:     Follow up in 4 weeks for exam and ROS

## 2023-12-09 NOTE — Progress Notes (Signed)
[facility] PREP Weekly Session  Patient Details  Name: Kelsey Nelson MRN: [id number] Date of Birth: April 12, 1934 Age: 89 y.o. PCP: Kerri Perches, MD  Vitals:   12/09/23 1330  Weight: 125 lb (56.7 kg)     [facility] Weekly seesion - 12/09/23 1500       [facility] "PREP" Location   [facility] "PREP" Location [facility]      Weekly Session   Topic Discussed Restaurant Eating;Eating for the season   Limit salt intake 1500-2300mg /day, alternatives for salt, salt demo, reading food labels   Minutes exercised this week 300 minutes    Classes attended to date 8             Remo Lipps 12/09/2023, 4:04 PM

## 2023-12-11 LAB — CERVICOVAGINAL ANCILLARY ONLY
Bacterial Vaginitis (gardnerella): POSITIVE — AB
Candida Glabrata: POSITIVE — AB
Candida Vaginitis: NEGATIVE
Comment: NEGATIVE
Comment: NEGATIVE
Comment: NEGATIVE

## 2023-12-12 ENCOUNTER — Telehealth: Payer: Self-pay | Admitting: Adult Health

## 2023-12-12 MED ORDER — METRONIDAZOLE 500 MG PO TABS: 500.0000 mg | ORAL_TABLET | Freq: Two times a day (BID) | ORAL | 0 refills | Status: DC

## 2023-12-12 NOTE — Telephone Encounter (Signed)
Pt aware that Vaginal swab +BV and candida glabrata, take another diflucan in 3 days, and will rx flagyl for the BV, no alcohol while taking. She is feeling better since using temovate

## 2023-12-15 ENCOUNTER — Encounter: Payer: Self-pay | Admitting: *Deleted

## 2023-12-15 NOTE — Progress Notes (Signed)
[facility] PREP Weekly Session  Patient Details  Name: SECORA VILLEGAS MRN: [id number] Date of Birth: 1933/12/25 Age: 89 y.o. PCP: Kerri Perches, MD  Vitals:   12/15/23 1330  Weight: 123 lb 3.2 oz (55.9 kg)     [facility] Weekly seesion - 12/15/23 1500       [facility] "PREP" Location   [facility]      Weekly Session   Topic Discussed Other   Review   Minutes exercised this week 435 minutes    Classes attended to date 9             Remo Lipps 12/15/2023, 6:47 PM

## 2023-12-25 ENCOUNTER — Ambulatory Visit (INDEPENDENT_AMBULATORY_CARE_PROVIDER_SITE_OTHER): Payer: PPO | Admitting: Gastroenterology

## 2023-12-26 ENCOUNTER — Encounter: Payer: Self-pay | Admitting: Family Medicine

## 2023-12-26 ENCOUNTER — Encounter (INDEPENDENT_AMBULATORY_CARE_PROVIDER_SITE_OTHER): Payer: Self-pay | Admitting: Gastroenterology

## 2023-12-26 ENCOUNTER — Ambulatory Visit (INDEPENDENT_AMBULATORY_CARE_PROVIDER_SITE_OTHER): Payer: PPO | Admitting: Family Medicine

## 2023-12-26 ENCOUNTER — Ambulatory Visit (INDEPENDENT_AMBULATORY_CARE_PROVIDER_SITE_OTHER): Payer: PPO | Admitting: Gastroenterology

## 2023-12-26 VITALS — BP 127/77 | HR 81 | Ht 65.0 in | Wt 124.0 lb

## 2023-12-26 VITALS — BP 145/73 | HR 73 | Temp 97.4°F | Ht 65.5 in | Wt 124.5 lb

## 2023-12-26 DIAGNOSIS — L292 Pruritus vulvae: Secondary | ICD-10-CM

## 2023-12-26 DIAGNOSIS — R142 Eructation: Secondary | ICD-10-CM

## 2023-12-26 DIAGNOSIS — K5900 Constipation, unspecified: Secondary | ICD-10-CM | POA: Diagnosis not present

## 2023-12-26 DIAGNOSIS — I10 Essential (primary) hypertension: Secondary | ICD-10-CM | POA: Diagnosis not present

## 2023-12-26 DIAGNOSIS — F5104 Psychophysiologic insomnia: Secondary | ICD-10-CM | POA: Diagnosis not present

## 2023-12-26 DIAGNOSIS — K219 Gastro-esophageal reflux disease without esophagitis: Secondary | ICD-10-CM

## 2023-12-26 DIAGNOSIS — G3184 Mild cognitive impairment, so stated: Secondary | ICD-10-CM

## 2023-12-26 DIAGNOSIS — R143 Flatulence: Secondary | ICD-10-CM | POA: Insufficient documentation

## 2023-12-26 DIAGNOSIS — E782 Mixed hyperlipidemia: Secondary | ICD-10-CM

## 2023-12-26 MED ORDER — IMIPRAMINE HCL 25 MG PO TABS: 25.0000 mg | ORAL_TABLET | Freq: Every day | ORAL | 3 refills | Status: DC

## 2023-12-26 MED ORDER — TRAZODONE HCL 50 MG PO TABS: 25.0000 mg | ORAL_TABLET | Freq: Every day | ORAL | 3 refills | Status: DC

## 2023-12-26 NOTE — Patient Instructions (Signed)
 Continue with omeprazole 40 mg twice a day Schedule EGD If negative testing, will need to proceed with SIBO breath test

## 2023-12-26 NOTE — Patient Instructions (Signed)
 F/U in 89 yo 10 weeks , call if you need me sooner  NEW MEDICATION FOR SLEEP , IMIPRAMINE  25 mg one at bedtime, also helps with urinary frequency at night  (Nurse pls call pharmacy and tell them that trazodone  is discontinued, do not fill, I had just sent  it in but am just increasing the dose of imipramine  only)  Thanks for choosing [facility], we consider it a privelige to serve you.

## 2023-12-26 NOTE — H&P (View-Only) (Signed)
[name], M.D. Gastroenterology & Hepatology [facility] Gastroenterology [address] [place], [place] [address]  Primary Care Physician: Kerri Perches, MD [address] [place] [address]  I will communicate my assessment and recommendations to the referring MD via EMR.  Problems: Chronic idiopathic constipation GERD Chronic burping   History of Present Illness: Kelsey Nelson is a 89 y.o. female with past medical history of GERD, hypertension, hyperlipidemia who comes for follow-up of constipation, GERD and burping.  The patient was last seen on 10/17/2023. At that time, the patient was advised to increase fiber intake and to increase omeprazole to 40 mg twice a day for management of burping episodes.  Patient reports that she is still having her chronic burping, which she has had for multiple years. States that with her BID PPI she has noticed only improvement in her heartburn, although it still may be present intermittently. No dysphagia.  States she is passing gas often as flatulence. No diarrhea. She eats a fair amount of fruit which helps with constipation.  States that since she had COVID 3 years ago, her taste has changed and has noticed her tongue has been getting somewhat larger.  The patient denies having any nausea, vomiting, fever, chills, hematochezia, melena, hematemesis, abdominal distention, abdominal pain, diarrhea, jaundice, pruritus or weight loss/gain.  Last AOZ:HYQMV  Last Colonoscopy: 07/08/2023 - Hemorrhoids found on perianal exam. - One 15 to 18 mm polyp in the ascending colon, removed piecemeal using a cold snare. Resected and retrieved via EMR. Clips were placed. Clip manufacturer: [employer]. Tattooed. - One 1 mm polyp in the ascending colon, removed with a cold biopsy forceps. Resected and retrieved. - Eight 2 to 8 mm polyps in the transverse colon and in the ascending colon, removed with a  cold snare. Resected and retrieved. - Diverticulosis in the sigmoid colon and in the descending colon. - External and internal hemorrhoids.   Pathology showed all polyps were tubular adenomas (total of 10 polyps, one measuring 18 mm)   Recommended repeat colonoscopy in 9 months  Past Medical History: Past Medical History:  Diagnosis Date   Acute cystitis with hematuria 10/26/2013   Allergic rhinitis 11/08/2017   Blindness of right eye    BMI between 19-24,adult 2010 145 LBS   Diverticula of small intestine Dec 2010   Diverticulosis    [place]   DIVERTICULOSIS, SIGMOID COLON 03/31/2008   Qualifier: Diagnosis of  By: Lind Guest  Noted on abdominal and pelvic scan in 04/2011.  Extensive colonic diverticulosis , and possible muscular hypertrophy at rectosigmoid junction    Family hx of colon cancer 11/09/2013   Functional constipation 2010   GERD (gastroesophageal reflux disease)    HTN (hypertension)    Hyperlipemia    Pancreatic cyst 02/05/2013   Renal cyst 05/27/2011   Right eye trauma 1949   Loss of vision resulted at age 66   Schatzki's ring Dec 2010   Skin lesions, generalized 06/19/2015   Tubular adenoma 11/09/2013    Past Surgical History: Past Surgical History:  Procedure Laterality Date   ABDOMINAL HYSTERECTOMY     BREAST REDUCTION SURGERY  1992 BIL   COLONOSCOPY  DEC 2010   SIMPLE ADENOMAS (6-8), [place] TICS, SML IH   COLONOSCOPY N/A 12/08/2013   Procedure: COLONOSCOPY;  Surgeon: Malissa Hippo, MD;  [facility];  Service: Endoscopy;  Laterality: N/A;  225   COLONOSCOPY WITH PROPOFOL N/A 07/08/2023   Procedure:  COLONOSCOPY WITH PROPOFOL;  Surgeon: Marguerita Merles, Reuel Boom, MD;  [facility];  Service: Gastroenterology;  Laterality: N/A;  9:15am; asa 3   EVISCERATION Right 05/08/2015   Procedure: EVISCERATION WITH IMPLANT RIGHT EYE;  Surgeon: Susa Simmonds, MD;  Location: [facility];  Service: Ophthalmology;  Laterality: Right;   EYE SURGERY  RIGHT 2005    EYE SURGERY  right 2016   removed and false eye placed in 08/2015   HEMOSTASIS CLIP PLACEMENT  07/08/2023   Procedure: HEMOSTASIS CLIP PLACEMENT;  Surgeon: Dolores Frame, MD;  [facility];  Service: Gastroenterology;;   POLYPECTOMY  07/08/2023   Procedure: POLYPECTOMY;  Surgeon: Dolores Frame, MD;  [facility];  Service: Gastroenterology;;   SUBMUCOSAL LIFTING INJECTION  07/08/2023   Procedure: SUBMUCOSAL LIFTING INJECTION;  Surgeon: Dolores Frame, MD;  [facility];  Service: Gastroenterology;;   SUBMUCOSAL TATTOO INJECTION  07/08/2023   Procedure: SUBMUCOSAL TATTOO INJECTION;  Surgeon: Dolores Frame, MD;  [facility];  Service: Gastroenterology;;   TOTAL ABDOMINAL HYSTERECTOMY W/ BILATERAL SALPINGOOPHORECTOMY  1985 FIBROIDS   UPPER GASTROINTESTINAL ENDOSCOPY  DEC 2010   Bx-MILD GASTRITIS/DUODENITIS, DUODENAL DIVERTICULA, SCHATZKI'S RING    Family History: Family History  Problem Relation Age of Onset   Pancreatic cancer Sister    Colon cancer Paternal Aunt    Colon polyps Neg Hx     Social History: Social History   Tobacco Use  Smoking Status Never   Passive exposure: Past  Smokeless Tobacco Never   Social History   Substance and Sexual Activity  Alcohol Use No   Alcohol/week: 0.0 standard drinks of alcohol   Social History   Substance and Sexual Activity  Drug Use No    Allergies: Allergies  Allergen Reactions   Codeine    Crestor [Rosuvastatin] Other (See Comments)    Can't recall   Fosamax [Alendronate Sodium]     heartburn    Medications: Current Outpatient Medications  Medication Sig Dispense Refill   Calcium Carbonate Antacid (TUMS PO) Take 1 tablet by mouth 3 (three) times daily as needed.     Cholecalciferol (VITAMIN D3 PO) Take 25 mcg by mouth daily.     clobetasol ointment (TEMOVATE) 0.05 % Use bid x 2 weeks then 2 x weekly on external tissue of vulva 30 g 1    conjugated estrogens (PREMARIN) vaginal cream Place 1 Applicatorful vaginally every other day. 42.5 g 5   diphenhydrAMINE (BENADRYL ALLERGY) 25 mg capsule Take 1 capsule (25 mg total) by mouth every 6 (six) hours as needed. 30 capsule 0   donepezil (ARICEPT) 10 MG tablet Take 1 tablet (10 mg total) by mouth at bedtime. 30 tablet 3   famotidine (PEPCID) 20 MG tablet Take 1 tablet (20 mg total) by mouth 2 (two) times daily. 60 tablet 3   fexofenadine (ALLEGRA ALLERGY) 180 MG tablet Take 1 tablet (180 mg total) by mouth every morning. 30 tablet 5   fluticasone (FLONASE) 50 MCG/ACT nasal spray Place 1 spray into both nostrils daily. 16 g 5   metroNIDAZOLE (FLAGYL) 500 MG tablet Take 1 tablet (500 mg total) by mouth 2 (two) times daily. No alcohol while taking 14 tablet 0   Multiple Vitamin (MULTIVITAMIN PO) Take by mouth.     omeprazole (PRILOSEC) 40 MG capsule Take 1 capsule (40 mg total) by mouth daily. 90 capsule 3   OVER THE COUNTER MEDICATION Multi Collagen Protein - Patient states that she takes  1 scoop a day.     OVER THE COUNTER MEDICATION One daily hylands leg cramp relief     OVER THE COUNTER MEDICATION Omega xl joint and muscle support daily     ramipril (ALTACE) 10 MG capsule Take one capsule by mouth once daily for blood pressure 90 capsule 3   imipramine (TOFRANIL) 10 MG tablet Take 1 tablet (10 mg total) by mouth at bedtime. (Patient not taking: Reported on 12/26/2023) 30 tablet 2   Current Facility-Administered Medications  Medication Dose Route Frequency Provider Last Rate Last Admin   omalizumab Geoffry Paradise) prefilled syringe 300 mg  300 mg Subcutaneous Q28 days Alfonse Spruce, MD   300 mg at 09/15/23 4098    Review of Systems: GENERAL: negative for malaise, night sweats HEENT: No changes in hearing or vision, no nose bleeds or other nasal problems. NECK: Negative for lumps, goiter, pain and significant neck swelling RESPIRATORY: Negative for cough, wheezing CARDIOVASCULAR:  Negative for chest pain, leg swelling, palpitations, orthopnea GI: SEE HPI MUSCULOSKELETAL: Negative for joint pain or swelling, back pain, and muscle pain. SKIN: Negative for lesions, rash PSYCH: Negative for sleep disturbance, mood disorder and recent psychosocial stressors. HEMATOLOGY Negative for prolonged bleeding, bruising easily, and swollen nodes. ENDOCRINE: Negative for cold or heat intolerance, polyuria, polydipsia and goiter. NEURO: negative for tremor, gait imbalance, syncope and seizures. The remainder of the review of systems is noncontributory.   Physical Exam: BP (!) 145/73   Pulse 73   Temp (!) 97.4 F (36.3 C) (Oral)   Ht 5' 5.5" (1.664 m)   Wt 124 lb 8 oz (56.5 kg)   BMI 20.40 kg/m  GENERAL: The patient is AO x3, in no acute distress. HEENT: Head is normocephalic and atraumatic. EOMI are intact. Mouth is well hydrated and without lesions. NECK: Supple. No masses LUNGS: Clear to auscultation. No presence of rhonchi/wheezing/rales. Adequate chest expansion HEART: RRR, normal s1 and s2. ABDOMEN: Soft, nontender, no guarding, no peritoneal signs, and nondistended. BS +. No masses. EXTREMITIES: Without any cyanosis, clubbing, rash, lesions or edema. NEUROLOGIC: AOx3, no focal motor deficit. SKIN: no jaundice, no rashes  Imaging/Labs: as above  I personally reviewed and interpreted the available labs, imaging and endoscopic files.  Impression and Plan: DENISE GRBIC is a 89 y.o. female with past medical history of GERD, hypertension, hyperlipidemia who comes for follow-up of constipation, GERD and burping.  Patient has presented persistent symptoms of burping despite taking high-dose PPI.  PPI has led to better control of her heartburn although she has still present with some mild symptoms.  This point, we discussed the possibility of exploring her symptoms further with an EGD, which she is agreeable to proceed with.  We discussed that if her investigations are  unremarkable, we may need to proceed with SIBO breath test.  -Continue with omeprazole 40 mg twice a day -Schedule EGD -If negative testing, will need to proceed with SIBO breath test  All questions were answered.      [name], MD Gastroenterology and Hepatology [facility]astro[facility]

## 2023-12-26 NOTE — Progress Notes (Signed)
 Kelsey Nelson, M.D. Gastroenterology & Hepatology [facility] Gastroenterology [address] [place], [place] [address]  Primary Care Physician: Kerri Perches, MD [address] [place] [address]  I will communicate my assessment and recommendations to the referring MD via EMR.  Problems: Chronic idiopathic constipation GERD Chronic burping   History of Present Illness: Kelsey Nelson is a 89 y.o. female with past medical history of GERD, hypertension, hyperlipidemia who comes for follow-up of constipation, GERD and burping.  The patient was last seen on 10/17/2023. At that time, the patient was advised to increase fiber intake and to increase omeprazole to 40 mg twice a day for management of burping episodes.  Patient reports that she is still having her chronic burping, which she has had for multiple years. States that with her BID PPI she has noticed only improvement in her heartburn, although it still may be present intermittently. No dysphagia.  States she is passing gas often as flatulence. No diarrhea. She eats a fair amount of fruit which helps with constipation.  States that since she had COVID 3 years ago, her taste has changed and has noticed her tongue has been getting somewhat larger.  The patient denies having any nausea, vomiting, fever, chills, hematochezia, melena, hematemesis, abdominal distention, abdominal pain, diarrhea, jaundice, pruritus or weight loss/gain.  Last AOZ:HYQMV  Last Colonoscopy: 07/08/2023 - Hemorrhoids found on perianal exam. - One 15 to 18 mm polyp in the ascending colon, removed piecemeal using a cold snare. Resected and retrieved via EMR. Clips were placed. Clip manufacturer: [employer]. Tattooed. - One 1 mm polyp in the ascending colon, removed with a cold biopsy forceps. Resected and retrieved. - Eight 2 to 8 mm polyps in the transverse colon and in the ascending colon, removed with a  cold snare. Resected and retrieved. - Diverticulosis in the sigmoid colon and in the descending colon. - External and internal hemorrhoids.   Pathology showed all polyps were tubular adenomas (total of 10 polyps, one measuring 18 mm)   Recommended repeat colonoscopy in 9 months  Past Medical History: Past Medical History:  Diagnosis Date   Acute cystitis with hematuria 10/26/2013   Allergic rhinitis 11/08/2017   Blindness of right eye    BMI between 19-24,adult 2010 145 LBS   Diverticula of small intestine Dec 2010   Diverticulosis    [place]   DIVERTICULOSIS, SIGMOID COLON 03/31/2008   Qualifier: Diagnosis of  By: Lind Guest  Noted on abdominal and pelvic scan in 04/2011.  Extensive colonic diverticulosis , and possible muscular hypertrophy at rectosigmoid junction    Family hx of colon cancer 11/09/2013   Functional constipation 2010   GERD (gastroesophageal reflux disease)    HTN (hypertension)    Hyperlipemia    Pancreatic cyst 02/05/2013   Renal cyst 05/27/2011   Right eye trauma 1949   Loss of vision resulted at age 66   Schatzki's ring Dec 2010   Skin lesions, generalized 06/19/2015   Tubular adenoma 11/09/2013    Past Surgical History: Past Surgical History:  Procedure Laterality Date   ABDOMINAL HYSTERECTOMY     BREAST REDUCTION SURGERY  1992 BIL   COLONOSCOPY  DEC 2010   SIMPLE ADENOMAS (6-8), [place] TICS, SML IH   COLONOSCOPY N/A 12/08/2013   Procedure: COLONOSCOPY;  Surgeon: Malissa Hippo, MD;  [facility];  Service: Endoscopy;  Laterality: N/A;  225   COLONOSCOPY WITH PROPOFOL N/A 07/08/2023   Procedure:  COLONOSCOPY WITH PROPOFOL;  Surgeon: Marguerita Merles, Reuel Boom, MD;  [facility];  Service: Gastroenterology;  Laterality: N/A;  9:15am; asa 3   EVISCERATION Right 05/08/2015   Procedure: EVISCERATION WITH IMPLANT RIGHT EYE;  Surgeon: Susa Simmonds, MD;  Location: [facility];  Service: Ophthalmology;  Laterality: Right;   EYE SURGERY  RIGHT 2005    EYE SURGERY  right 2016   removed and false eye placed in 08/2015   HEMOSTASIS CLIP PLACEMENT  07/08/2023   Procedure: HEMOSTASIS CLIP PLACEMENT;  Surgeon: Dolores Frame, MD;  [facility];  Service: Gastroenterology;;   POLYPECTOMY  07/08/2023   Procedure: POLYPECTOMY;  Surgeon: Dolores Frame, MD;  [facility];  Service: Gastroenterology;;   SUBMUCOSAL LIFTING INJECTION  07/08/2023   Procedure: SUBMUCOSAL LIFTING INJECTION;  Surgeon: Dolores Frame, MD;  [facility];  Service: Gastroenterology;;   SUBMUCOSAL TATTOO INJECTION  07/08/2023   Procedure: SUBMUCOSAL TATTOO INJECTION;  Surgeon: Dolores Frame, MD;  [facility];  Service: Gastroenterology;;   TOTAL ABDOMINAL HYSTERECTOMY W/ BILATERAL SALPINGOOPHORECTOMY  1985 FIBROIDS   UPPER GASTROINTESTINAL ENDOSCOPY  DEC 2010   Bx-MILD GASTRITIS/DUODENITIS, DUODENAL DIVERTICULA, SCHATZKI'S RING    Family History: Family History  Problem Relation Age of Onset   Pancreatic cancer Sister    Colon cancer Paternal Aunt    Colon polyps Neg Hx     Social History: Social History   Tobacco Use  Smoking Status Never   Passive exposure: Past  Smokeless Tobacco Never   Social History   Substance and Sexual Activity  Alcohol Use No   Alcohol/week: 0.0 standard drinks of alcohol   Social History   Substance and Sexual Activity  Drug Use No    Allergies: Allergies  Allergen Reactions   Codeine    Crestor [Rosuvastatin] Other (See Comments)    Can't recall   Fosamax [Alendronate Sodium]     heartburn    Medications: Current Outpatient Medications  Medication Sig Dispense Refill   Calcium Carbonate Antacid (TUMS PO) Take 1 tablet by mouth 3 (three) times daily as needed.     Cholecalciferol (VITAMIN D3 PO) Take 25 mcg by mouth daily.     clobetasol ointment (TEMOVATE) 0.05 % Use bid x 2 weeks then 2 x weekly on external tissue of vulva 30 g 1    conjugated estrogens (PREMARIN) vaginal cream Place 1 Applicatorful vaginally every other day. 42.5 g 5   diphenhydrAMINE (BENADRYL ALLERGY) 25 mg capsule Take 1 capsule (25 mg total) by mouth every 6 (six) hours as needed. 30 capsule 0   donepezil (ARICEPT) 10 MG tablet Take 1 tablet (10 mg total) by mouth at bedtime. 30 tablet 3   famotidine (PEPCID) 20 MG tablet Take 1 tablet (20 mg total) by mouth 2 (two) times daily. 60 tablet 3   fexofenadine (ALLEGRA ALLERGY) 180 MG tablet Take 1 tablet (180 mg total) by mouth every morning. 30 tablet 5   fluticasone (FLONASE) 50 MCG/ACT nasal spray Place 1 spray into both nostrils daily. 16 g 5   metroNIDAZOLE (FLAGYL) 500 MG tablet Take 1 tablet (500 mg total) by mouth 2 (two) times daily. No alcohol while taking 14 tablet 0   Multiple Vitamin (MULTIVITAMIN PO) Take by mouth.     omeprazole (PRILOSEC) 40 MG capsule Take 1 capsule (40 mg total) by mouth daily. 90 capsule 3   OVER THE COUNTER MEDICATION Multi Collagen Protein - Patient states that she takes  1 scoop a day.     OVER THE COUNTER MEDICATION One daily hylands leg cramp relief     OVER THE COUNTER MEDICATION Omega xl joint and muscle support daily     ramipril (ALTACE) 10 MG capsule Take one capsule by mouth once daily for blood pressure 90 capsule 3   imipramine (TOFRANIL) 10 MG tablet Take 1 tablet (10 mg total) by mouth at bedtime. (Patient not taking: Reported on 12/26/2023) 30 tablet 2   Current Facility-Administered Medications  Medication Dose Route Frequency Provider Last Rate Last Admin   omalizumab Geoffry Paradise) prefilled syringe 300 mg  300 mg Subcutaneous Q28 days Alfonse Spruce, MD   300 mg at 09/15/23 4098    Review of Systems: GENERAL: negative for malaise, night sweats HEENT: No changes in hearing or vision, no nose bleeds or other nasal problems. NECK: Negative for lumps, goiter, pain and significant neck swelling RESPIRATORY: Negative for cough, wheezing CARDIOVASCULAR:  Negative for chest pain, leg swelling, palpitations, orthopnea GI: SEE HPI MUSCULOSKELETAL: Negative for joint pain or swelling, back pain, and muscle pain. SKIN: Negative for lesions, rash PSYCH: Negative for sleep disturbance, mood disorder and recent psychosocial stressors. HEMATOLOGY Negative for prolonged bleeding, bruising easily, and swollen nodes. ENDOCRINE: Negative for cold or heat intolerance, polyuria, polydipsia and goiter. NEURO: negative for tremor, gait imbalance, syncope and seizures. The remainder of the review of systems is noncontributory.   Physical Exam: BP (!) 145/73   Pulse 73   Temp (!) 97.4 F (36.3 C) (Oral)   Ht 5' 5.5" (1.664 m)   Wt 124 lb 8 oz (56.5 kg)   BMI 20.40 kg/m  GENERAL: The patient is AO x3, in no acute distress. HEENT: Head is normocephalic and atraumatic. EOMI are intact. Mouth is well hydrated and without lesions. NECK: Supple. No masses LUNGS: Clear to auscultation. No presence of rhonchi/wheezing/rales. Adequate chest expansion HEART: RRR, normal s1 and s2. ABDOMEN: Soft, nontender, no guarding, no peritoneal signs, and nondistended. BS +. No masses. EXTREMITIES: Without any cyanosis, clubbing, rash, lesions or edema. NEUROLOGIC: AOx3, no focal motor deficit. SKIN: no jaundice, no rashes  Imaging/Labs: as above  I personally reviewed and interpreted the available labs, imaging and endoscopic files.  Impression and Plan: Kelsey Nelson is a 89 y.o. female with past medical history of GERD, hypertension, hyperlipidemia who comes for follow-up of constipation, GERD and burping.  Patient has presented persistent symptoms of burping despite taking high-dose PPI.  PPI has led to better control of her heartburn although she has still present with some mild symptoms.  This point, we discussed the possibility of exploring her symptoms further with an EGD, which she is agreeable to proceed with.  We discussed that if her investigations are  unremarkable, we may need to proceed with SIBO breath test.  -Continue with omeprazole 40 mg twice a day -Schedule EGD -If negative testing, will need to proceed with SIBO breath test  All questions were answered.      Kelsey Blazing, MD Gastroenterology and Hepatology [facility] Gastroenterology

## 2023-12-28 NOTE — Assessment & Plan Note (Signed)
Hyperlipidemia:Low fat diet discussed and encouraged.   Lipid Panel  Lab Results  Component Value Date   CHOL 288 (H) 01/21/2023   HDL 102 01/21/2023   LDLCALC 168 (H) 01/21/2023   TRIG 106 01/21/2023   CHOLHDL 2.8 01/21/2023     Updated lab needed at/ before next visit.

## 2023-12-28 NOTE — Progress Notes (Signed)
   KAORU BENDA     MRN: [id number]      DOB: September 03, 1934  Chief Complaint  Patient presents with   Follow-up    Follow up    HPI [name] is here for follow up and re-evaluation of chronic medical conditions, medication management and review of any available recent lab and radiology data.  Preventive health is updated, specifically  Cancer screening and Immunization.   Doing very well with Gyne management The PT denies any adverse reactions to current medications since the last visit.  Did not refill med for insomnia as hadno benefit, still has chronic debilitating insomnia ROS Denies recent fever or chills. Denies sinus pressure, nasal congestion, ear pain or sore throat. Denies chest congestion, productive cough or wheezing. Denies chest pains, palpitations and leg swelling Denies abdominal pain, nausea, vomiting,diarrhea or constipation.   Denies dysuria, frequency, hesitancy or incontinence. Denies joint pain, swelling and limitation in mobility. Denies headaches, seizures, numbness, or tingling. Denies depression, anxiety Denies skin break down or rash.   PE  BP 127/77 (BP Location: Right Arm, Patient Position: Sitting, Cuff Size: Normal)   Pulse 81   Ht 5' 5 (1.651 m)   Wt 124 lb 0.6 oz (56.3 kg)   SpO2 94%   BMI 20.64 kg/m   Patient alert and oriented and in no cardiopulmonary distress.  HEENT: No facial asymmetry, EOMI,     Neck supple .  Chest: Clear to auscultation bilaterally.  CVS: S1, S2 no murmurs, no S3.Regular rate.  ABD: Soft non tender.   Ext: No edema  MS: Adequate ROM spine, shoulders, hips and knees.  Skin: Intact, no ulcerations or rash noted.  Psych: Good eye contact, normal affect. Memory intact not anxious or depressed appearing.  CNS: CN 2-12 intact, power,  normal throughout.no focal deficits noted.   Assessment & Plan  Insomnia Chronic and debilitating , inc dose imipramine , may add trazodone     HTN  (hypertension) Controlled, no change in medication   Mixed hyperlipidemia Hyperlipidemia:Low fat diet discussed and encouraged.   Lipid Panel  Lab Results  Component Value Date   CHOL 288 (H) 01/21/2023   HDL 102 01/21/2023   LDLCALC 168 (H) 01/21/2023   TRIG 106 01/21/2023   CHOLHDL 2.8 01/21/2023   Updated lab needed at/ before next visit.     MCI (mild cognitive impairment) Continue current medication, improved on meds  GERD Controlled, no change in medication   Vulvar itching Inmproved with current management by Gyne

## 2023-12-28 NOTE — Assessment & Plan Note (Signed)
 Controlled, no change in medication

## 2023-12-28 NOTE — Assessment & Plan Note (Signed)
 Chronic and debilitating , inc dose imipramine, may add trazodone

## 2023-12-28 NOTE — Assessment & Plan Note (Signed)
 Continue current medication, improved on meds

## 2023-12-28 NOTE — Assessment & Plan Note (Signed)
 Inmproved with current management by [facility]

## 2023-12-29 ENCOUNTER — Other Ambulatory Visit: Payer: Self-pay | Admitting: Adult Health

## 2023-12-30 ENCOUNTER — Telehealth (INDEPENDENT_AMBULATORY_CARE_PROVIDER_SITE_OTHER): Payer: Self-pay | Admitting: Gastroenterology

## 2023-12-30 ENCOUNTER — Encounter: Payer: Self-pay | Admitting: *Deleted

## 2023-12-30 DIAGNOSIS — E782 Mixed hyperlipidemia: Secondary | ICD-10-CM | POA: Diagnosis not present

## 2023-12-30 DIAGNOSIS — E559 Vitamin D deficiency, unspecified: Secondary | ICD-10-CM | POA: Diagnosis not present

## 2023-12-30 DIAGNOSIS — I10 Essential (primary) hypertension: Secondary | ICD-10-CM | POA: Diagnosis not present

## 2023-12-30 NOTE — Telephone Encounter (Signed)
 Pt returned call. Pt scheduled for 01/13/24. Instructions will be mailed once pre op is received.

## 2023-12-30 NOTE — Telephone Encounter (Signed)
 [facility] to schedule EGD with Dr.Castaneda Room 3

## 2023-12-31 LAB — CBC
Hematocrit: 41.4 % (ref 34.0–46.6)
Hemoglobin: 13.6 g/dL (ref 11.1–15.9)
MCH: 31.1 pg (ref 26.6–33.0)
MCHC: 32.9 g/dL (ref 31.5–35.7)
MCV: 95 fL (ref 79–97)
Platelets: 259 10*3/uL (ref 150–450)
RBC: 4.38 x10E6/uL (ref 3.77–5.28)
RDW: 12.3 % (ref 11.7–15.4)
WBC: 5.2 10*3/uL (ref 3.4–10.8)

## 2023-12-31 LAB — LIPID PANEL
Chol/HDL Ratio: 2.7 {ratio} (ref 0.0–4.4)
Cholesterol, Total: 303 mg/dL — ABNORMAL HIGH (ref 100–199)
HDL: 112 mg/dL (ref >39)
LDL Chol Calc (NIH): 174 mg/dL — ABNORMAL HIGH (ref 0–99)
Triglycerides: 103 mg/dL (ref 0–149)
VLDL Cholesterol Cal: 17 mg/dL (ref 5–40)

## 2023-12-31 LAB — CMP14+EGFR
ALT: 24 [IU]/L (ref 0–32)
AST: 28 [IU]/L (ref 0–40)
Albumin: 4.4 g/dL (ref 3.7–4.7)
Alkaline Phosphatase: 120 [IU]/L (ref 44–121)
BUN/Creatinine Ratio: 18 (ref 12–28)
BUN: 18 mg/dL (ref 8–27)
Bilirubin Total: 0.4 mg/dL (ref 0.0–1.2)
CO2: 25 mmol/L (ref 20–29)
Calcium: 9.6 mg/dL (ref 8.7–10.3)
Chloride: 96 mmol/L (ref 96–106)
Creatinine, Ser: 1.01 mg/dL — ABNORMAL HIGH (ref 0.57–1.00)
Globulin, Total: 2.3 g/dL (ref 1.5–4.5)
Glucose: 96 mg/dL (ref 70–99)
Potassium: 4.2 mmol/L (ref 3.5–5.2)
Sodium: 137 mmol/L (ref 134–144)
Total Protein: 6.7 g/dL (ref 6.0–8.5)
eGFR: 54 mL/min/{1.73_m2} — ABNORMAL LOW (ref >59)

## 2023-12-31 LAB — VITAMIN D 25 HYDROXY (VIT D DEFICIENCY, FRACTURES): Vit D, 25-Hydroxy: 44.2 ng/mL (ref 30.0–100.0)

## 2023-12-31 LAB — TSH: TSH: 3.33 u[IU]/mL (ref 0.450–4.500)

## 2023-12-31 NOTE — Progress Notes (Signed)
 [employer] [facility] Weekly Session  Patient Details  Name: VERDELLE VALTIERRA MRN: [id number] Date of Birth: 1934/07/22 Age: 89 y.o. PCP: [name], MD  Vitals:   12/30/23 1330  Weight: 125 lb (56.7 kg)     [employer] Weekly seesion - 12/30/23 1500       [employer] PRE[facility] Location   [employer] [facility] Location [facility]      Weekly Session   Topic Discussed Stress management and problem solving;Other   Sleep and its importance to good health. Ways to improve sleep quality. Meditation exercise.   Minutes exercised this week 420 minutes    Classes attended to date 51             [place] 12/31/2023, 7:01 PM

## 2024-01-01 ENCOUNTER — Encounter: Payer: Self-pay | Admitting: Emergency Medicine

## 2024-01-01 ENCOUNTER — Ambulatory Visit
Admission: EM | Admit: 2024-01-01 | Discharge: 2024-01-01 | Disposition: A | Discharge status: Home / Self Care | Payer: PPO | Attending: Nurse Practitioner | Admitting: Nurse Practitioner

## 2024-01-01 DIAGNOSIS — K1379 Other lesions of oral mucosa: Secondary | ICD-10-CM | POA: Diagnosis not present

## 2024-01-01 DIAGNOSIS — K1370 Unspecified lesions of oral mucosa: Secondary | ICD-10-CM

## 2024-01-01 MED ORDER — NYSTATIN 100000 UNIT/ML MT SUSP: 10.0000 mL | Freq: Four times a day (QID) | OROMUCOSAL | 0 refills | Status: DC | PRN

## 2024-01-01 NOTE — Discharge Instructions (Addendum)
 Use mouthwash as prescribed. May take Tylenol  as needed for pain or general discomfort. Warm saltwater rinses 3 to 4 times daily while symptoms persist. Recommend a bland diet while symptoms persist. Avoid salty foods or acidic foods. Make sure you are drinking plenty of fluids. Good dental hygiene. If symptoms fail to improve, please follow-up with your primary care physician for further evaluation.  Follow-up as needed.

## 2024-01-01 NOTE — ED Provider Notes (Signed)
 [facility]    CSN: [id number] Arrival date & time: 01/01/24  0805      History   Chief Complaint No chief complaint on file.   HPI Kelsey Nelson is a 89 y.o. female.   The history is provided by the patient.   Patient presents for a 3-day history of mouth pain and tongue pain.  Patient states pain is in the roof of her mouth, and swelling to the tongue.  She states that she has noticed a white coating on her tongue over the past several days..  States that she has pain with eating, and just at rest.  She denies fever, chills, recent URI, treatment for cancer, or being on immunosuppressants or inhalers.  Patient reports that she did try using essential oils to the affected areas with minimal relief.  Past Medical History:  Diagnosis Date   Acute cystitis with hematuria 10/26/2013   Allergic rhinitis 11/08/2017   Blindness of right eye    BMI between 19-24,adult 2010 145 LBS   Diverticula of small intestine Dec 2010   Diverticulosis    [place]   DIVERTICULOSIS, SIGMOID COLON 03/31/2008   Qualifier: Diagnosis of  By: Tita Lipps  Noted on abdominal and pelvic scan in 04/2011.  Extensive colonic diverticulosis , and possible muscular hypertrophy at rectosigmoid junction    Family hx of colon cancer 11/09/2013   Functional constipation 2010   GERD (gastroesophageal reflux disease)    HTN (hypertension)    Hyperlipemia    Pancreatic cyst 02/05/2013   Renal cyst 05/27/2011   Right eye trauma 1949   Loss of vision resulted at age 64   Schatzki's ring Dec 2010   Skin lesions, generalized 06/19/2015   Tubular adenoma 11/09/2013    Patient Active Problem List   Diagnosis Date Noted   Flatulence 12/26/2023   Lichen sclerosus et atrophicus 12/09/2023   Vulvar itching 12/09/2023   Vaginal itching 12/09/2023   S/P hysterectomy with oophorectomy 12/09/2023   Cystocele, unspecified 12/09/2023   Vaginal vault prolapse 12/09/2023   Pessary maintenance 12/09/2023   Muscle  cramps 10/31/2023   Complication of prosthetic eye 08/15/2023   Chronic urticaria 04/24/2023   MCI (mild cognitive impairment) 03/10/2023   Xerostomia 12/13/2022   CKD (chronic kidney disease) stage 3, GFR 30-59 ml/min (HCC) 09/23/2022   Allergic dermatitis 07/19/2022   Food allergy 07/19/2022   Skin lesions 01/22/2022   Abdominal spasms 01/22/2022   Recurrent UTI 05/10/2021   Skipped heart beats 10/19/2020   Anterior knee pain, right 10/05/2019   Burping 10/05/2019   Ectropion of right eye 11/22/2015   Acquired superior sulcus deformity of orbit 07/04/2015   History of evisceration of eye 07/04/2015   Nuclear sclerosis of left eye 07/04/2015   Urinary urgency 10/31/2013   Loss of taste 07/31/2012   Pancreatic cyst 04/21/2012   H/O gastroesophageal reflux (GERD) 04/21/2012   Nocturia 12/02/2011   Insomnia 09/20/2011   Nausea 06/22/2011   Change in bowel habits 06/21/2011   GERD 11/20/2009   Constipation 11/20/2009   Mixed hyperlipidemia 03/31/2008   BLINDNESS, RIGHT EYE 03/31/2008   HTN (hypertension) 03/31/2008   Osteoporosis 03/31/2008    Past Surgical History:  Procedure Laterality Date   ABDOMINAL HYSTERECTOMY     BREAST REDUCTION SURGERY  1992 BIL   COLONOSCOPY  DEC 2010   SIMPLE ADENOMAS (6-8), [place] TICS, SML IH   COLONOSCOPY N/A 12/08/2013   Procedure: COLONOSCOPY;  Surgeon: Claudis RAYMOND Rivet, MD;  Location: [facility];  Service: Endoscopy;  Laterality: N/A;  225   COLONOSCOPY WITH PROPOFOL  N/A 07/08/2023   Procedure: COLONOSCOPY WITH PROPOFOL ;  Surgeon: Eartha Angelia Sieving, MD;  Location: [facility];  Service: Gastroenterology;  Laterality: N/A;  9:15am; asa 3   EVISCERATION Right 05/08/2015   Procedure: EVISCERATION WITH IMPLANT RIGHT EYE;  Surgeon: Dow JULIANNA Burke, MD;  Location: [facility];  Service: Ophthalmology;  Laterality: Right;   EYE SURGERY  RIGHT 2005   EYE SURGERY  right 2016   removed and false eye placed in 08/2015   HEMOSTASIS CLIP PLACEMENT   07/08/2023   Procedure: HEMOSTASIS CLIP PLACEMENT;  Surgeon: Eartha Angelia Sieving, MD;  Location: [facility];  Service: Gastroenterology;;   POLYPECTOMY  07/08/2023   Procedure: POLYPECTOMY;  Surgeon: Eartha Angelia Sieving, MD;  [facility]: [facility];  Service: Gastroenterology;;   SUBMUCOSAL LIFTING INJECTION  07/08/2023   Procedure: SUBMUCOSAL LIFTING INJECTION;  Surgeon: Eartha Angelia Sieving, MD;  [facility]: [facility];  Service: Gastroenterology;;   SUBMUCOSAL TATTOO INJECTION  07/08/2023   Procedure: SUBMUCOSAL TATTOO INJECTION;  Surgeon: Eartha Angelia Sieving, MD;  Location: [facility];  Service: Gastroenterology;;   TOTAL ABDOMINAL HYSTERECTOMY W/ BILATERAL SALPINGOOPHORECTOMY  1985 FIBROIDS   UPPER GASTROINTESTINAL ENDOSCOPY  DEC 2010   Bx-MILD GASTRITIS/DUODENITIS, DUODENAL DIVERTICULA, SCHATZKI'S RING    OB History     Gravida      Para      Term      Preterm      AB      Living  3      SAB      IAB      Ectopic      Multiple      Live Births               Home Medications    Prior to Admission medications   Medication Sig Start Date End Date Taking? Authorizing Provider  Calcium  Carbonate Antacid (TUMS PO) Take 1 tablet by mouth 3 (three) times daily as needed.    [provider]  Cholecalciferol (VITAMIN D3 PO) Take 25 mcg by mouth daily.    [provider]  clobetasol  ointment (TEMOVATE ) 0.05 % Use bid x 2 weeks then 2 x weekly on external tissue of vulva 12/09/23   Signa Nest A, NP  conjugated estrogens  (PREMARIN ) vaginal cream Place 1 Applicatorful vaginally every other day. 04/09/23   McKenzie, Belvie CROME, MD  diphenhydrAMINE  (BENADRYL  ALLERGY) 25 mg capsule Take 1 capsule (25 mg total) by mouth every 6 (six) hours as needed. 08/06/22   Zarwolo, Gloria, FNP  donepezil  (ARICEPT ) 10 MG tablet Take 1 tablet (10 mg total) by mouth at bedtime. 09/09/23   [name], MD  famotidine  (PEPCID ) 20 MG  tablet Take 1 tablet (20 mg total) by mouth 2 (two) times daily. 08/29/22 12/26/23  Iva Marty Saltness, MD  fexofenadine  (ALLEGRA  ALLERGY) 180 MG tablet Take 1 tablet (180 mg total) by mouth every morning. 04/21/23   Tobie Arleta SQUIBB, MD  fluticasone  (FLONASE ) 50 MCG/ACT nasal spray Place 1 spray into both nostrils daily. 04/21/23   Tobie Arleta SQUIBB, MD  imipramine  (TOFRANIL ) 25 MG tablet Take 1 tablet (25 mg total) by mouth at bedtime. 12/26/23   [name], MD  metroNIDAZOLE  (FLAGYL ) 500 MG tablet Take 1 tablet (500 mg total) by mouth 2 (two) times daily. No alcohol while taking. 12/29/23   Signa Nest LABOR, NP  Multiple Vitamin (MULTIVITAMIN PO) Take  by mouth.    [provider]  omeprazole  (PRILOSEC) 40 MG capsule Take 1 capsule (40 mg total) by mouth daily. 10/17/23   Eartha Angelia Sieving, MD  OVER THE COUNTER MEDICATION Multi Collagen Protein - Patient states that she takes 1 scoop a day.    [provider]  OVER THE COUNTER MEDICATION One daily hylands leg cramp relief    [provider]  OVER THE COUNTER MEDICATION Omega xl joint and muscle support daily    [provider]  ramipril  (ALTACE ) 10 MG capsule Take one capsule by mouth once daily for blood pressure 03/10/23   [name], MD    Family History Family History  Problem Relation Age of Onset   Pancreatic cancer Sister    Colon cancer Paternal Aunt    Colon polyps Neg Hx     Social History Social History   Tobacco Use   Smoking status: Never    Passive exposure: Past   Smokeless tobacco: Never  Vaping Use   Vaping status: Never Used  Substance Use Topics   Alcohol use: No    Alcohol/week: 0.0 standard drinks of alcohol   Drug use: No     Allergies   Codeine, Crestor  [rosuvastatin ], and Fosamax [alendronate sodium]   Review of Systems Review of Systems Per HPI  Physical Exam Triage Vital Signs ED Triage Vitals  Encounter Vitals Group     BP 01/01/24 0815  113/74     Systolic BP Percentile --      Diastolic BP Percentile --      Pulse Rate 01/01/24 0815 89     Resp 01/01/24 0815 16     Temp 01/01/24 0815 97.6 F (36.4 C)     Temp Source 01/01/24 0815 Oral     SpO2 01/01/24 0815 98 %     Weight --      Height --      Head Circumference --      Peak Flow --      Pain Score 01/01/24 0816 8     Pain Loc --      Pain Education --      Exclude from Growth Chart --    No data found.  Updated Vital Signs BP 113/74 (BP Location: Right Arm)   Pulse 89   Temp 97.6 F (36.4 C) (Oral)   Resp 16   SpO2 98%   Visual Acuity Right Eye Distance:   Left Eye Distance:   Bilateral Distance:    Right Eye Near:   Left Eye Near:    Bilateral Near:     Physical Exam Vitals and nursing note reviewed.  Constitutional:      General: She is not in acute distress.    Appearance: Normal appearance.  HENT:     Head: Normocephalic.     Mouth/Throat:     Lips: Pink.     Mouth: Mucous membranes are moist.     Tongue: Lesions present.     Palate: Lesions (lesions noted on hard palate) present.     Pharynx: Oropharynx is clear. Uvula midline.     Tonsils: No tonsillar exudate.     Comments: White coating noted to tongue.  Mild swelling also present.  Lesions noted to the hard palate.  No buccal lesions present.  No oropharyngeal erythema, swelling, or redness present. Eyes:     Extraocular Movements: Extraocular movements intact.     Pupils: Pupils are equal, round, and reactive to light.  Cardiovascular:     Pulses: Normal pulses.     Heart sounds: Normal heart sounds.  Pulmonary:     Effort: Pulmonary effort is normal.     Breath sounds: Normal breath sounds.  Abdominal:     General: Bowel sounds are normal.     Palpations: Abdomen is soft.     Tenderness: There is no abdominal tenderness.  Musculoskeletal:     Cervical back: Normal range of motion.  Lymphadenopathy:     Cervical: No cervical adenopathy.  Skin:    General: Skin is  warm and dry.  Neurological:     General: No focal deficit present.     Mental Status: She is alert and oriented to person, place, and time.  Psychiatric:        Mood and Affect: Mood normal.        Behavior: Behavior normal.      UC Treatments / Results  Labs (all labs ordered are listed, but only abnormal results are displayed) Labs Reviewed - No data to display  EKG   Radiology No results found.  Procedures Procedures (including critical care time)  Medications Ordered in UC Medications - No data to display  Initial Impression / Assessment and Plan / UC Course  I have reviewed the triage vital signs and the nursing notes.  Pertinent labs & imaging results that were available during my care of the patient were reviewed by me and considered in my medical decision making (see chart for details).  Symptoms appear to be consistent with stomatitis.  Patient denies history of cancer treatment, use of oral inhalers, and immunosuppression/immunosuppressant medications.  Will treat with Magic mouthwash 10 mL, which includes nystatin , viscous lidocaine , diphenhydramine , and hydrocortisone.  Supportive care recommendations were provided and discussed with the patient to include over-the-counter Tylenol , warm salt water  rinses, a bland diet, and good oral hygiene.  Discussed indications with the patient regarding follow-up.  Patient was in agreement with this plan of care and verbalizes understanding.  All questions were answered.  Patient stable for discharge.   Final Clinical Impressions(s) / UC Diagnoses   Final diagnoses:  None   Discharge Instructions   None    ED Prescriptions   None    PDMP not reviewed this encounter.   Gilmer Etta PARAS, NP 01/01/24 [phone]

## 2024-01-01 NOTE — ED Triage Notes (Signed)
 States she has blisters in her mouth x 2 to 3 days.

## 2024-01-06 ENCOUNTER — Encounter: Payer: Self-pay | Admitting: Adult Health

## 2024-01-06 ENCOUNTER — Ambulatory Visit: Payer: PPO | Admitting: Adult Health

## 2024-01-06 VITALS — BP 126/80 | HR 71 | Ht 65.5 in | Wt 123.0 lb

## 2024-01-06 DIAGNOSIS — L900 Lichen sclerosus et atrophicus: Secondary | ICD-10-CM

## 2024-01-06 DIAGNOSIS — L292 Pruritus vulvae: Secondary | ICD-10-CM | POA: Diagnosis not present

## 2024-01-06 NOTE — Progress Notes (Signed)
  Subjective:     Patient ID: Kelsey Nelson, female   DOB: [birth year], 89 y.o.   MRN: [id number]  HPI Kelsey Nelson is a 89 year old white female, married, sp hysterectomy, back in follow up on vulva itching, with lichen sclerous, and itching is much better. She was seen at Urgent Care 01/01/24 for sores in mouth and sore tongue was treated with Magic Mouthwash, was good until yesterday now has sore spot on hard palate.  PCP is Dr Antonetta  Review of Systems Vulva itching is much better Has sore spot in mouth Denies any bleeding or vaginal discharge, has pessary Reviewed past medical,surgical, social and family history. Reviewed medications and allergies.     Objective:   Physical Exam BP 126/80 (BP Location: Left Arm, Patient Position: Sitting, Cuff Size: Normal)   Pulse 71   Ht 5' 5.5 (1.664 m)   Wt 123 lb (55.8 kg)   BMI 20.16 kg/m     Skin warm and dry.Pelvic: external genitalia is normal in appearance no lesions,and less redness, did not do vaginal exam. Did look in her mouth has white lesion on hard palate, to go see dentist for exam   [facility] - 01/06/24 0939       Pregnancy Intention Screening   Does the patient want to become pregnant in the next year? N/A    Does the patient's partner want to become pregnant in the next year? N/A    Would the patient like to discuss contraceptive options today? N/A      Contraception Wrap Up   Current Method Female Sterilization   hyst   End Method Female Sterilization   hyst   Contraception Counseling Provided No             Assessment:     1. Vulvar itching Much better since using temovate   2. Lichen sclerosus et atrophicus Looks better less red Use temovate  2-3 x weekly    Plan:    Return in 4 weeks for pessary maintenance, and ROS

## 2024-01-08 ENCOUNTER — Encounter: Payer: Self-pay | Admitting: Family Medicine

## 2024-01-08 ENCOUNTER — Ambulatory Visit (INDEPENDENT_AMBULATORY_CARE_PROVIDER_SITE_OTHER): Payer: PPO | Admitting: Family Medicine

## 2024-01-08 VITALS — BP 130/70 | HR 76 | Ht 65.0 in | Wt 123.0 lb

## 2024-01-08 DIAGNOSIS — K219 Gastro-esophageal reflux disease without esophagitis: Secondary | ICD-10-CM | POA: Diagnosis not present

## 2024-01-08 DIAGNOSIS — K1379 Other lesions of oral mucosa: Secondary | ICD-10-CM | POA: Diagnosis not present

## 2024-01-08 DIAGNOSIS — F5104 Psychophysiologic insomnia: Secondary | ICD-10-CM

## 2024-01-08 DIAGNOSIS — D126 Benign neoplasm of colon, unspecified: Secondary | ICD-10-CM | POA: Diagnosis not present

## 2024-01-08 DIAGNOSIS — I10 Essential (primary) hypertension: Secondary | ICD-10-CM

## 2024-01-08 MED ORDER — IMIPRAMINE HCL 50 MG PO TABS: 50.0000 mg | ORAL_TABLET | Freq: Every day | ORAL | 3 refills | Status: DC

## 2024-01-08 NOTE — Patient Instructions (Signed)
Follow-up in February as before, call if you need to see me sooner.  The area where you have soreness looks healthy, there is no redness and no ulceration.  It is good that you do report symptom improvement using the mouthwash.  Continue to use it twice daily for the next 2 to 3 days and then feels all off of the medicine if you do not need it.  Please do go ahead with the upper endoscopy next week.     For poor sleep and excessive bed time urination, increase your dose of imipramine to 25 mg two  tablets at bedtime , new script sent for 50 mg one at bedtime when next due to fill.   If you are not able to tolerate the higher dose back down to the  one 25 mg  tablet and let me know.   Hopefully the dose increase  will be of benefit, if it is not we will discontinue the medication , this will be re addressed at your February visit  Best for 2025!  Thanks for choosing [facility], we consider it a privelige to serve you.

## 2024-01-09 ENCOUNTER — Encounter (HOSPITAL_COMMUNITY)
Admission: RE | Admit: 2024-01-09 | Discharge: 2024-01-09 | Disposition: A | Discharge status: Home / Self Care | Payer: PPO | Source: Ambulatory Visit | Attending: Gastroenterology

## 2024-01-09 ENCOUNTER — Encounter: Payer: PPO | Admitting: Obstetrics & Gynecology

## 2024-01-09 NOTE — Pre-Procedure Instructions (Signed)
Pt called for preadmission testing. I informed pt that it wasn't necessary to come in for appointment as she had recent blood work but preferred to have instructions in writing so will pick up instructions at main entrance front desk.

## 2024-01-09 NOTE — Patient Instructions (Signed)
Kelsey Nelson  01/09/2024     @PREFPERIOPPHARMACY @   Your procedure is scheduled on Tuesday, 1/21.  Report to [facility] at 1210 P.M.  Call this number if you have problems the morning of surgery:  [phone]  If you experience any cold or flu symptoms such as cough, fever, chills, shortness of breath, etc. between now and your scheduled surgery, please notify us at the above number.   Remember:  Do not eat or drink after midnight.  You may drink clear liquids until 1000 .  Clear liquids allowed are:                    Water, Juice (No red color; non-citric and without pulp; diabetics please choose diet or no sugar options), Carbonated beverages (diabetics please choose diet or no sugar options), Clear Tea (No creamer, milk, or cream, including half & half and powdered creamer), Black Coffee Only (No creamer, milk or cream, including half & half and powdered creamer), Plain Jell-O Only (No red color; diabetics please choose no sugar options), and Clear Sports drink (No red color; diabetics please choose diet or no sugar options)    Take these medicines the morning of surgery with A SIP OF WATER: famotidine    Do not wear jewelry, make-up or nail polish, including gel polish,  artificial nails, or any other type of covering on natural nails (fingers and  toes).  Do not wear lotions, powders, or perfumes, or deodorant.  Do not shave 48 hours prior to surgery.  Men may shave face and neck.  Do not bring valuables to the hospital.  [facility] is not responsible for any belongings or valuables.  Contacts, dentures or bridgework may not be worn into surgery.  Leave your suitcase in the car.  After surgery it may be brought to your room.  For patients admitted to the hospital, discharge time will be determined by your treatment team.  Patients discharged the day of surgery will not be allowed to drive home.   Name and phone number of your driver:   husband, Kelsey Nelson Special  instructions:  Please follow instructions included from Dr. Wilburt Finlay office.  Please read over the following fact sheets that you were given. Anesthesia Post-op Instructions    Upper Endoscopy, Adult Upper endoscopy is a procedure to look inside the upper GI (gastrointestinal) tract. The upper GI tract is made up of: The esophagus. This is the part of the body that moves food from your mouth to your stomach. The stomach. The duodenum. This is the first part of your small intestine. This procedure is also called esophagogastroduodenoscopy (EGD) or gastroscopy. In this procedure, your health care provider passes a thin, flexible tube (endoscope) through your mouth and down your esophagus into your stomach and into your duodenum. A small camera is attached to the end of the tube. Images from the camera appear on a monitor in the exam room. During this procedure, your health care provider may also remove a small piece of tissue to be sent to a lab and examined under a microscope (biopsy). Your health care provider may do an upper endoscopy to diagnose cancers of the upper GI tract. You may also have this procedure to find the cause of other conditions, such as: Stomach pain. Heartburn. Pain or problems when swallowing. Nausea and vomiting. Stomach bleeding. Stomach ulcers. Tell a health care provider about: Any allergies you have. All medicines you are taking, including vitamins, herbs, eye  drops, creams, and over-the-counter medicines. Any problems you or family members have had with anesthetic medicines. Any bleeding problems you have. Any surgeries you have had. Any medical conditions you have. Whether you are pregnant or may be pregnant. What are the risks? Your healthcare provider will talk with you about risks. These may include: Infection. Bleeding. Allergic reactions to medicines. A tear or hole (perforation) in the esophagus, stomach, or duodenum. What happens before the  procedure? When to stop eating and drinking Follow instructions from your health care provider about what you may eat and drink. These may include: 8 hours before your procedure Stop eating most foods. Do not eat meat, fried foods, or fatty foods. Eat only light foods, such as toast or crackers. All liquids are okay except energy drinks and alcohol. 6 hours before your procedure Stop eating. Drink only clear liquids, such as water, clear fruit juice, black coffee, plain tea, and sports drinks. Do not drink energy drinks or alcohol. 2 hours before your procedure Stop drinking all liquids. You may be allowed to take medicines with small sips of water. If you do not follow your health care provider's instructions, your procedure may be delayed or canceled. Medicines Ask your health care provider about: Changing or stopping your regular medicines. This is especially important if you are taking diabetes medicines or blood thinners. Taking medicines such as aspirin and ibuprofen. These medicines can thin your blood. Do not take these medicines unless your health care provider tells you to take them. Taking over-the-counter medicines, vitamins, herbs, and supplements. General instructions If you will be going home right after the procedure, plan to have a responsible adult: Take you home from the hospital or clinic. You will not be allowed to drive. Care for you for the time you are told. What happens during the procedure?  An IV will be inserted into one of your veins. You may be given one or more of the following: A medicine to help you relax (sedative). A medicine to numb the throat (local anesthetic). You will lie on your left side on an exam table. Your health care provider will pass the endoscope through your mouth and down your esophagus. Your health care provider will use the scope to check the inside of your esophagus, stomach, and duodenum. Biopsies may be taken. The endoscope  will be removed. The procedure may vary among health care providers and hospitals. What happens after the procedure? Your blood pressure, heart rate, breathing rate, and blood oxygen level will be monitored until you leave the hospital or clinic. When your throat is no longer numb, you may be given some fluids to drink. If you were given a sedative during the procedure, it can affect you for several hours. Do not drive or operate machinery until your health care provider says that it is safe. It is up to you to get the results of your procedure. Ask your health care provider, or the department that is doing the procedure, when your results will be ready. Contact a health care provider if you: Have a sore throat that lasts longer than 1 day. Have a fever. Get help right away if you: Vomit blood or your vomit looks like coffee grounds. Have bloody, black, or tarry stools. Have a very bad sore throat or you cannot swallow. Have difficulty breathing or very bad pain in your chest or abdomen. These symptoms may be an emergency. Get help right away. Call 911. Do not wait to see if the  symptoms will go away. Do not drive yourself to the hospital. Summary Upper endoscopy is a procedure to look inside the upper GI tract. During the procedure, an IV will be inserted into one of your veins. You may be given a medicine to help you relax. The endoscope will be passed through your mouth and down your esophagus. Follow instructions from your health care provider about what you can eat and drink. This information is not intended to replace advice given to you by your health care provider. Make sure you discuss any questions you have with your health care provider. Document Revised: 03/20/2022 Document Reviewed: 03/20/2022 Elsevier Patient Education  2024 [employer].Monitored Anesthesia Care, Care After The following information offers guidance on how to care for yourself after your procedure. Your  health care provider may also give you more specific instructions. If you have problems or questions, contact your health care provider. What can I expect after the procedure? After the procedure, it is common to have: Tiredness. Little or no memory about what happened during or after the procedure. Impaired judgment when it comes to making decisions. Nausea or vomiting. Some trouble with balance. Follow these instructions at home: For the time period you were told by your health care provider:  Rest. Do not participate in activities where you could fall or become injured. Do not drive or use machinery. Do not drink alcohol. Do not take sleeping pills or medicines that cause drowsiness. Do not make important decisions or sign legal documents. Do not take care of children on your own. Medicines Take over-the-counter and prescription medicines only as told by your health care provider. If you were prescribed antibiotics, take them as told by your health care provider. Do not stop using the antibiotic even if you start to feel better. Eating and drinking Follow instructions from your health care provider about what you may eat and drink. Drink enough fluid to keep your urine pale yellow. If you vomit: Drink clear fluids slowly and in small amounts as you are able. Clear fluids include water, ice chips, low-calorie sports drinks, and fruit juice that has water added to it (diluted fruit juice). Eat light and bland foods in small amounts as you are able. These foods include bananas, applesauce, rice, lean meats, toast, and crackers. General instructions  Have a responsible adult stay with you for the time you are told. It is important to have someone help care for you until you are awake and alert. If you have sleep apnea, surgery and some medicines can increase your risk for breathing problems. Follow instructions from your health care provider about wearing your sleep device: When you are  sleeping. This includes during daytime naps. While taking prescription pain medicines, sleeping medicines, or medicines that make you drowsy. Do not use any products that contain nicotine or tobacco. These products include cigarettes, chewing tobacco, and vaping devices, such as e-cigarettes. If you need help quitting, ask your health care provider. Contact a health care provider if: You feel nauseous or vomit every time you eat or drink. You feel light-headed. You are still sleepy or having trouble with balance after 24 hours. You get a rash. You have a fever. You have redness or swelling around the IV site. Get help right away if: You have trouble breathing. You have new confusion after you get home. These symptoms may be an emergency. Get help right away. Call 911. Do not wait to see if the symptoms will go away. Do not  drive yourself to the hospital. This information is not intended to replace advice given to you by your health care provider. Make sure you discuss any questions you have with your health care provider. Document Revised: 05/06/2022 Document Reviewed: 05/06/2022 Elsevier Patient Education  2024 [employer].

## 2024-01-13 ENCOUNTER — Encounter (HOSPITAL_COMMUNITY): Payer: Self-pay | Admitting: Gastroenterology

## 2024-01-13 ENCOUNTER — Ambulatory Visit (HOSPITAL_COMMUNITY): Payer: PPO | Admitting: Anesthesiology

## 2024-01-13 ENCOUNTER — Ambulatory Visit (HOSPITAL_COMMUNITY)
Admission: RE | Admit: 2024-01-13 | Discharge: 2024-01-13 | Disposition: A | Discharge status: Home / Self Care | Payer: PPO | Attending: Gastroenterology | Admitting: Gastroenterology

## 2024-01-13 ENCOUNTER — Encounter (HOSPITAL_COMMUNITY)
Admission: RE | Disposition: A | Discharge status: Home / Self Care | Payer: Self-pay | Source: Home / Self Care | Attending: Gastroenterology

## 2024-01-13 ENCOUNTER — Ambulatory Visit (HOSPITAL_COMMUNITY): Payer: Self-pay | Admitting: Anesthesiology

## 2024-01-13 DIAGNOSIS — E785 Hyperlipidemia, unspecified: Secondary | ICD-10-CM | POA: Diagnosis not present

## 2024-01-13 DIAGNOSIS — N183 Chronic kidney disease, stage 3 unspecified: Secondary | ICD-10-CM | POA: Diagnosis not present

## 2024-01-13 DIAGNOSIS — Z79899 Other long term (current) drug therapy: Secondary | ICD-10-CM | POA: Insufficient documentation

## 2024-01-13 DIAGNOSIS — I129 Hypertensive chronic kidney disease with stage 1 through stage 4 chronic kidney disease, or unspecified chronic kidney disease: Secondary | ICD-10-CM

## 2024-01-13 DIAGNOSIS — R142 Eructation: Secondary | ICD-10-CM | POA: Diagnosis not present

## 2024-01-13 DIAGNOSIS — K5900 Constipation, unspecified: Secondary | ICD-10-CM | POA: Insufficient documentation

## 2024-01-13 DIAGNOSIS — D175 Benign lipomatous neoplasm of intra-abdominal organs: Secondary | ICD-10-CM | POA: Diagnosis not present

## 2024-01-13 DIAGNOSIS — Z8616 Personal history of COVID-19: Secondary | ICD-10-CM | POA: Insufficient documentation

## 2024-01-13 DIAGNOSIS — K449 Diaphragmatic hernia without obstruction or gangrene: Secondary | ICD-10-CM | POA: Diagnosis not present

## 2024-01-13 DIAGNOSIS — K219 Gastro-esophageal reflux disease without esophagitis: Secondary | ICD-10-CM | POA: Insufficient documentation

## 2024-01-13 DIAGNOSIS — E782 Mixed hyperlipidemia: Secondary | ICD-10-CM | POA: Diagnosis not present

## 2024-01-13 DIAGNOSIS — I10 Essential (primary) hypertension: Secondary | ICD-10-CM | POA: Insufficient documentation

## 2024-01-13 DIAGNOSIS — Z860101 Personal history of adenomatous and serrated colon polyps: Secondary | ICD-10-CM | POA: Insufficient documentation

## 2024-01-13 DIAGNOSIS — K648 Other hemorrhoids: Secondary | ICD-10-CM | POA: Insufficient documentation

## 2024-01-13 HISTORY — PX: ESOPHAGOGASTRODUODENOSCOPY (EGD) WITH PROPOFOL: SHX5813

## 2024-01-13 SURGERY — ESOPHAGOGASTRODUODENOSCOPY (EGD) WITH PROPOFOL
Anesthesia: General

## 2024-01-13 MED ORDER — LACTATED RINGERS IV SOLN: INTRAVENOUS | Status: DC

## 2024-01-13 MED ORDER — OMEPRAZOLE 40 MG PO CPDR: 40.0000 mg | DELAYED_RELEASE_CAPSULE | Freq: Two times a day (BID) | ORAL | 1 refills | Status: DC

## 2024-01-13 MED ORDER — LIDOCAINE HCL (PF) 2 % IJ SOLN
INTRAMUSCULAR | Status: DC | PRN
  Administered 2024-01-13: 60 mg via INTRADERMAL

## 2024-01-13 MED ORDER — PROPOFOL 10 MG/ML IV BOLUS
INTRAVENOUS | Status: DC | PRN
  Administered 2024-01-13: 50 mg via INTRAVENOUS

## 2024-01-13 NOTE — Transfer of Care (Signed)
Immediate Anesthesia Transfer of Care Note  Patient: Kelsey Nelson  Procedure(s) Performed: ESOPHAGOGASTRODUODENOSCOPY (EGD) WITH PROPOFOL  Patient Location: Endoscopy Unit  Anesthesia Type:General  Level of Consciousness: awake, alert , and oriented  Airway & Oxygen Therapy: Patient Spontanous Breathing  Post-op Assessment: Report given to RN and Post -op Vital signs reviewed and stable  Post vital signs: Reviewed and stable  Last Vitals:  Vitals Value Taken Time  BP    Temp    Pulse    Resp    SpO2      Last Pain:  Vitals:   01/13/24 1358  TempSrc:   PainSc: 0-No pain         Complications: No notable events documented.

## 2024-01-13 NOTE — Anesthesia Preprocedure Evaluation (Signed)
Anesthesia Evaluation  Patient identified by MRN, date of birth, ID band Patient awake    Reviewed: Allergy & Precautions, H&P , NPO status , Patient's Chart, lab work & pertinent test results, reviewed documented beta blocker date and time   Airway Mallampati: II  TM Distance: >3 FB Neck ROM: full    Dental no notable dental hx.    Pulmonary neg pulmonary ROS   Pulmonary exam normal breath sounds clear to auscultation       Cardiovascular Exercise Tolerance: Good hypertension, negative cardio ROS  Rhythm:regular Rate:Normal     Neuro/Psych negative neurological ROS  negative psych ROS   GI/Hepatic negative GI ROS, Neg liver ROS,GERD  ,,  Endo/Other  negative endocrine ROS    Renal/GU Renal diseasenegative Renal ROS  negative genitourinary   Musculoskeletal   Abdominal   Peds  Hematology negative hematology ROS (+)   Anesthesia Other Findings   Reproductive/Obstetrics negative OB ROS                             Anesthesia Physical Anesthesia Plan  ASA: 3  Anesthesia Plan: General   Post-op Pain Management:    Induction:   PONV Risk Score and Plan: Propofol infusion  Airway Management Planned:   Additional Equipment:   Intra-op Plan:   Post-operative Plan:   Informed Consent: I have reviewed the patients History and Physical, chart, labs and discussed the procedure including the risks, benefits and alternatives for the proposed anesthesia with the patient or authorized representative who has indicated his/her understanding and acceptance.     Dental Advisory Given  Plan Discussed with: CRNA  Anesthesia Plan Comments:        Anesthesia Quick Evaluation

## 2024-01-13 NOTE — Op Note (Signed)
[facility] Patient Name: Kelsey Nelson Procedure Date: 01/13/2024 1:46 PM MRN: [id number] Date of Birth: 1934/03/24 Attending MD: [name] , , [id number] CSN: [id number] Age: 89 Admit Type: Outpatient Procedure:                Upper GI endoscopy Indications:              Eructation Providers:                [name], Crystal Page, Lennice Sites                            Technician, Technician Referring MD:              Medicines:                Monitored Anesthesia Care Complications:            No immediate complications. Estimated Blood Loss:     Estimated blood loss: none. Procedure:                Pre-Anesthesia Assessment:                           - Prior to the procedure, a History and Physical                            was performed, and patient medications, allergies                            and sensitivities were reviewed. The patient's                            tolerance of previous anesthesia was reviewed.                           - The risks and benefits of the procedure and the                            sedation options and risks were discussed with the                            patient. All questions were answered and informed                            consent was obtained.                           - ASA Grade Assessment: II - A patient with mild                            systemic disease.                           After obtaining informed consent, the endoscope was                            passed under direct vision. Throughout the  procedure, the patient's blood pressure, pulse, and                            oxygen saturations were monitored continuously. The                            [id number]-H190 ([id number]) scope was introduced through the                            mouth, and advanced to the second part of duodenum.                            The upper GI endoscopy was accomplished without                             difficulty. The patient tolerated the procedure                            well. Scope In: 2:02:39 PM Scope Out: 2:05:07 PM Total Procedure Duration: 0 hours 2 minutes 28 seconds  Findings:      A 2 cm hiatal hernia was present.      The stomach was normal.      There was a medium-sized lipoma in the first portion of the duodenum. Impression:               - 2 cm hiatal hernia.                           - Normal stomach.                           - Duodenal lipoma.                           - No specimens collected. Moderate Sedation:      Per Anesthesia Care Recommendation:           - Discharge patient to home (ambulatory).                           - Resume previous diet.                           - Use Prilosec (omeprazole) 40 mg PO BID.                           - Stop Pepcid.                           - Proceed with SIBO breath test. Procedure Code(s):        --- Professional ---                           [phone], Esophagogastroduodenoscopy, flexible,                            transoral; diagnostic, including collection of  specimen(s) by brushing or washing, when performed                            (separate procedure) Diagnosis Code(s):        --- Professional ---                           K44.9, Diaphragmatic hernia without obstruction or                            gangrene                           D17.5, Benign lipomatous neoplasm of                            intra-abdominal organs                           R14.2, Eructation CPT copyright 2022 American Medical Association. All rights reserved. The codes documented in this report are preliminary and upon coder review may  be revised to meet current compliance requirements. [name], MD [name],  01/13/2024 2:18:39 PM This report has been signed electronically. Number of Addenda: 0

## 2024-01-13 NOTE — Interval H&P Note (Signed)
History and Physical Interval Note:  01/13/2024 1:48 PM  Kelsey Nelson  has presented today for surgery, with the diagnosis of BURPING.  The various methods of treatment have been discussed with the patient and family. After consideration of risks, benefits and other options for treatment, the patient has consented to  Procedure(s) with comments: ESOPHAGOGASTRODUODENOSCOPY (EGD) WITH PROPOFOL (N/A) - 2:30PM;ASA 3 as a surgical intervention.  The patient's history has been reviewed, patient examined, no change in status, stable for surgery.  I have reviewed the patient's chart and labs.  Questions were answered to the patient's satisfaction.     [name]

## 2024-01-13 NOTE — Discharge Instructions (Signed)
You are being discharged to home.  Resume your previous diet.  Take Prilosec (omeprazole) 40 mg by mouth twice a day.  Stop Pepcid. Proceed with SIBO breath test.

## 2024-01-14 ENCOUNTER — Telehealth (INDEPENDENT_AMBULATORY_CARE_PROVIDER_SITE_OTHER): Payer: Self-pay | Admitting: *Deleted

## 2024-01-14 NOTE — Telephone Encounter (Signed)
Thanks

## 2024-01-14 NOTE — Telephone Encounter (Signed)
-----   Message from [name] sent at 01/13/2024  2:18 PM EST ----- Casandra Doffing, can you please refer the patient to [facility] for SIBO breath test  Thanks,   [name], MD Gastroenterology and Hepatology [facility]

## 2024-01-14 NOTE — Telephone Encounter (Signed)
Referral sent, they will contact patient with apt

## 2024-01-15 ENCOUNTER — Encounter (HOSPITAL_COMMUNITY): Payer: Self-pay | Admitting: Gastroenterology

## 2024-01-16 NOTE — Anesthesia Postprocedure Evaluation (Signed)
Anesthesia Post Note  Patient: Kelsey Nelson  Procedure(s) Performed: ESOPHAGOGASTRODUODENOSCOPY (EGD) WITH PROPOFOL  Patient location during evaluation: Phase II Anesthesia Type: General Level of consciousness: awake Pain management: pain level controlled Vital Signs Assessment: post-procedure vital signs reviewed and stable Respiratory status: spontaneous breathing and respiratory function stable Cardiovascular status: blood pressure returned to baseline and stable Postop Assessment: no headache and no apparent nausea or vomiting Anesthetic complications: no Comments: Late entry   No notable events documented.   Last Vitals:  Vitals:   01/13/24 1242 01/13/24 1411  BP: (!) 167/74 (!) 161/88  Pulse: 73 74  Resp: 19 19  Temp: 36.5 C 36.7 C  SpO2: 100% 100%    Last Pain:  Vitals:   01/13/24 1411  TempSrc: Oral  PainSc: 0-No pain                 Windell Norfolk

## 2024-01-18 ENCOUNTER — Other Ambulatory Visit: Payer: Self-pay | Admitting: Family Medicine

## 2024-01-19 DIAGNOSIS — K635 Polyp of colon: Secondary | ICD-10-CM | POA: Insufficient documentation

## 2024-01-19 DIAGNOSIS — K1379 Other lesions of oral mucosa: Secondary | ICD-10-CM | POA: Insufficient documentation

## 2024-01-19 NOTE — Assessment & Plan Note (Signed)
Controlled, no change in medication

## 2024-01-19 NOTE — Assessment & Plan Note (Signed)
Will d/w pt at next visit , should be due in 03/2024 no appt on schedule currently

## 2024-01-19 NOTE — Assessment & Plan Note (Signed)
Normal exam despite c/o ulcers on roof of mouth, presumably have healed with time, continue dukes for an additional 3 to 5 days

## 2024-01-19 NOTE — Assessment & Plan Note (Signed)
Uncontrolled , c/o chronic belching and oral pain, GI managing  and to continue

## 2024-01-19 NOTE — Assessment & Plan Note (Signed)
Increase imipramine dose Sleep hygiene reviewed and written information offered also. Prescription sent for  medication needed.

## 2024-01-19 NOTE — Progress Notes (Signed)
   Kelsey Nelson     MRN: [id number]      DOB: 02-23-1934  Chief Complaint  Patient presents with   Follow-up    Sores in mouth x 2 weeks seen at urgent care with no relief     HPI Kelsey Nelson is here for follow up of oral sores. Recently  evaluated at [facility] currnetly using Dukes with some relief however wants to be checked and also an opnion as to whether she should have the upper endo and could her oral condition be related to reflux Sleep and excessive nocturia is unchanged ROS Denies recent fever or chills. Denies sinus pressure, nasal congestion, ear pain or sore throat. Denies chest congestion, productive cough or wheezing. Denies chest pains, palpitations and leg swelling  Denies joint pain, swelling and limitation in mobility. Denies headaches, seizures, numbness, or tingling. Denies depression, anxiety . Denies skin break down or rash.   PE  BP 130/70   Pulse 76   Ht 5\' 5"  (1.651 m)   Wt 123 lb 0.6 oz (55.8 kg)   SpO2 97%   BMI 20.47 kg/m   Patient alert and oriented and in no cardiopulmonary distress.  HEENT: No facial asymmetry, EOMI,     Neck supple .Oral mucosa examined , no ulceration or sores noted even in area where patient reports discomfort  Chest: Clear to auscultation bilaterally.  CVS: S1, S2 no murmurs, no S3.Regular rate.  ABD: Soft non tender.   Ext: No edema  MS: Adequate ROM spine, shoulders, hips and knees.  Skin: Intact, no ulcerations or rash noted.  Psych: Good eye contact, normal affect. Mildly  anxious not  depressed appearing.  CNS: CN 2-12 intact, power,  normal throughout.no focal deficits noted.   Assessment & Plan  Oral pain Normal exam despite c/o ulcers on roof of mouth, presumably have healed with time, continue dukes for an additional 3 to 5 days  HTN (hypertension) Controlled, no change in medication   Insomnia Increase imipramine dose Sleep hygiene reviewed and written information offered also. Prescription sent  for  medication needed.   GERD Uncontrolled , c/o chronic belching and oral pain, GI managing  and to continue  Colon polyps Will d/w pt at next visit , should be due in 03/2024 no appt on schedule currently

## 2024-01-20 ENCOUNTER — Encounter: Payer: Self-pay | Admitting: *Deleted

## 2024-01-21 NOTE — Progress Notes (Signed)
[employer] PREP Weekly Session  Patient Details  Name: Kelsey Nelson MRN: [id number] Date of Birth: 05-12-1934 Age: 89 y.o. PCP: Kerri Perches, MD  There were no vitals filed for this visit.   [facility] Weekly seesion - 01/20/24 1500       [facility] "PREP" Location   [facility] "PREP" Location [facility]      Weekly Session   Topic Discussed Other   Portion Size matters, tips for portion control, deceptive labeling   Classes attended to date 80             Remo Lipps 01/21/2024, 2:13 PM

## 2024-01-22 ENCOUNTER — Ambulatory Visit: Payer: PPO | Admitting: Adult Health

## 2024-01-22 ENCOUNTER — Encounter: Payer: Self-pay | Admitting: Adult Health

## 2024-01-22 VITALS — BP 125/71 | HR 80 | Ht 65.5 in | Wt 124.0 lb

## 2024-01-22 DIAGNOSIS — Z4689 Encounter for fitting and adjustment of other specified devices: Secondary | ICD-10-CM | POA: Diagnosis not present

## 2024-01-22 DIAGNOSIS — N8110 Cystocele, unspecified: Secondary | ICD-10-CM

## 2024-01-22 DIAGNOSIS — Z90721 Acquired absence of ovaries, unilateral: Secondary | ICD-10-CM | POA: Diagnosis not present

## 2024-01-22 DIAGNOSIS — N819 Female genital prolapse, unspecified: Secondary | ICD-10-CM

## 2024-01-22 DIAGNOSIS — N949 Unspecified condition associated with female genital organs and menstrual cycle: Secondary | ICD-10-CM | POA: Diagnosis not present

## 2024-01-22 DIAGNOSIS — Z90710 Acquired absence of both cervix and uterus: Secondary | ICD-10-CM | POA: Diagnosis not present

## 2024-01-22 NOTE — Progress Notes (Signed)
  Subjective:     Patient ID: Kelsey Nelson, female   DOB: May 05, 1934, 89 y.o.   MRN: [id number]  HPI Kelsey Nelson is a 89 year old white female, married, sp hysterectomy, worked in for vaginal discomfort, has pessary and thinks it has moved, could not insert PVC.  PCP is Dr Lodema Hong   Review of Systems +vaginal discomfort No bleeding noted  Reviewed past medical,surgical, social and family history. Reviewed medications and allergies.     Objective:   Physical Exam BP 125/71 (BP Location: Left Arm, Patient Position: Sitting, Cuff Size: Normal)   Pulse 80   Ht 5' 5.5" (1.664 m)   Wt 124 lb (56.2 kg)   BMI 20.32 kg/m     Skin warm and dry.Pelvic: external genitalia:vulva is slightly red, thin tissue at introitus, vagina: #2 ring with support removed, and washed with soap and water and dried, +cystocele and prolapse,no irritation noted, pessary reinserted, urethra has no lesions or masses noted, cervix and uterus are absent, adnexa: no masses or tenderness noted. Bladder is non tender and no masses felt.   Upstream - 01/22/24 1516       Pregnancy Intention Screening   Does the patient want to become pregnant in the next year? N/A    Does the patient's partner want to become pregnant in the next year? N/A    Would the patient like to discuss contraceptive options today? N/A      Contraception Wrap Up   Current Method Female Sterilization   hyst   End Method Female Sterilization   hyst   Contraception Counseling Provided No            Examination chaperoned by Malachy Mood LPN  Assessment:     1. Vaginal discomfort (Primary) Has discomfort, though pessary moved Pessary cleaned and reinserted   2. Cystocele, unspecified  3. Vaginal vault prolapse  4. Pessary maintenance Pessary cleaned and reinserted   5. S/P hysterectomy with oophorectomy     Plan:     Return 02/02/24 for pessary check

## 2024-01-27 ENCOUNTER — Encounter: Payer: Self-pay | Admitting: *Deleted

## 2024-01-27 NOTE — Progress Notes (Signed)
 [facility] Session  Patient Details  Name: ROSSY VIRAG MRN: [id number] Date of Birth: February 16, 1934 Age: 89 y.o. PCP: [name], MD  Vitals:   01/27/24 1330  Weight: 126 lb (57.2 kg)     [facility] Weekly seesion - 01/27/24 1500       [facility] PREP Location   [facility] PRE[facility] Location [facility]      Weekly Session   Topic Discussed Calorie breakdown;Finding support   Finding internal and external support, review of macronutrients, healthy vs unhealthy carbs, importance of dietary protein. Assigned Goals and Activity sheet for final assessment.   Minutes exercised this week 495 minutes    Classes attended to date 34             Gorge Line 01/27/2024, 8:36 PM

## 2024-02-02 ENCOUNTER — Ambulatory Visit: Payer: PPO | Admitting: Adult Health

## 2024-02-02 ENCOUNTER — Encounter: Payer: Self-pay | Admitting: Adult Health

## 2024-02-02 VITALS — BP 112/68 | HR 106 | Ht 65.5 in | Wt 121.5 lb

## 2024-02-02 DIAGNOSIS — R143 Flatulence: Secondary | ICD-10-CM | POA: Diagnosis not present

## 2024-02-02 DIAGNOSIS — R142 Eructation: Secondary | ICD-10-CM

## 2024-02-02 DIAGNOSIS — R110 Nausea: Secondary | ICD-10-CM | POA: Diagnosis not present

## 2024-02-02 NOTE — Progress Notes (Signed)
  Subjective:     Patient ID: Kelsey Nelson, female   DOB: 17-Sep-1934, 89 y.o.   MRN: [id number]  HPI Kelsey Nelson is a 89 year old white female, maried, sp hysterectomy in for pessary check, but declines says it is good. Has had nausea and more burping and flatulence, has meds but not helping, threw meds up yestaerday. PCP is Dr Rodolph Clap Review of Systems Has nausea, burping and flatulence has meds from GI, but not helpin Pessary is good Reviewed past medical,surgical, social and family history. Reviewed medications and allergies.     Objective:   Physical Exam BP 112/68 (BP Location: Left Arm, Patient Position: Sitting, Cuff Size: Normal)   Pulse (!) 106   Ht 5' 5.5" (1.664 m)   Wt 121 lb 8 oz (55.1 kg)   BMI 19.91 kg/m     Skin warm and dry. Lungs: clear to ausculation bilaterally. Cardiovascular: regular rate and rhythm.  Abdomen soft, non tender faint bowel sounds Declines pessary check today.  Upstream - 02/02/24 0902       Pregnancy Intention Screening   Does the patient want to become pregnant in the next year? N/A    Does the patient's partner want to become pregnant in the next year? N/A    Would the patient like to discuss contraceptive options today? N/A      Contraception Wrap Up   Current Method Female Sterilization   hyst   End Method Female Sterilization   hyst   Contraception Counseling Provided No             Assessment:     1. Nausea (Primary) Has more nausea, feels light headed today Call or go see GI  Declines meds for nausea   2. Burping Has more burping lately has meds, not helping  3. Flatulence Has had for a while     Plan:    Return in 2 months for pessary maintenance

## 2024-02-03 ENCOUNTER — Encounter (INDEPENDENT_AMBULATORY_CARE_PROVIDER_SITE_OTHER): Payer: Self-pay | Admitting: Gastroenterology

## 2024-02-03 ENCOUNTER — Encounter: Payer: Self-pay | Admitting: *Deleted

## 2024-02-03 ENCOUNTER — Ambulatory Visit (INDEPENDENT_AMBULATORY_CARE_PROVIDER_SITE_OTHER): Payer: PPO | Admitting: Gastroenterology

## 2024-02-03 ENCOUNTER — Ambulatory Visit: Payer: PPO | Admitting: Adult Health

## 2024-02-03 VITALS — BP 142/84 | HR 76 | Temp 97.1°F | Ht 65.5 in | Wt 123.9 lb

## 2024-02-03 DIAGNOSIS — R142 Eructation: Secondary | ICD-10-CM

## 2024-02-03 DIAGNOSIS — R143 Flatulence: Secondary | ICD-10-CM | POA: Diagnosis not present

## 2024-02-03 NOTE — Progress Notes (Addendum)
Referring Provider: Kerri Perches, MD Primary Care Physician:  Kerri Perches, MD Primary GI Physician: Dr. Levon Hedger   Chief Complaint  Patient presents with   Gastroesophageal Reflux    Follow up on GERD. Has concerns about gas and belching. Taking omeprazole 40mg  bid.   HPI:   Kelsey Nelson is a 89 y.o. female with past medical history of GERD, HTN, HLD  Patient presenting today for ongoing belching and flatulence   Patient last seen in January by Dr. Levon Hedger, at that time still having chronic burping which she has had for multiple years.  Taking PPI twice daily with only improvement in her heartburn.  Passing gas and flatulence often.  No diarrhea.  Patient admitted to continue omeprazole 40 mg twice daily, schedule EGD, proceed with SIBO breath testing if EGD is negative.  EGD as outlined below was normal, referred for SIBO testing  Present: Patient states that she continues to have belching and passing of flatus. Denies heartburn or acid regurgitation. No abdominal pain. She denies nausea but reports sometimes she regurgitates pills back up if they are larger. She thinks she has appt at [facility] for SIBO testing in march but her daughter has all of her appts. She has tried phazyme/gas x without much improvement. She does note she had some ongoing ulcers in her mouth which seemed to resolve after her EGD.   Most recent labs done in January with mostly normal CBC, CMP    EGD: 12/26/2023 2 cm hiatal hernia, normal stomach, duodenal lipoma, no specimens collected, recommended to proceed with SIBO breath testing and start Pepcid Last Colonoscopy: 07/08/2023 - Hemorrhoids found on perianal exam. - One 15 to 18 mm polyp in the ascending colon, removed piecemeal using a cold snare. Resected and retrieved via EMR. Clips were placed. Clip manufacturer: [employer]. Tattooed. - One 1 mm polyp in the ascending colon, removed with a cold biopsy forceps. Resected and  retrieved. - Eight 2 to 8 mm polyps in the transverse colon and in the ascending colon, removed with a cold snare. Resected and retrieved. - Diverticulosis in the sigmoid colon and in the descending colon. - External and internal hemorrhoids.   Pathology showed all polyps were tubular adenomas (total of 10 polyps, one measuring 18 mm)   Recommended repeat colonoscopy in 9 months   Past Medical History:  Diagnosis Date   Acute cystitis with hematuria 10/26/2013   Allergic rhinitis 11/08/2017   Blindness of right eye    BMI between 19-24,adult 2010 145 LBS   Diverticula of small intestine Dec 2010   Diverticulosis    [place]   DIVERTICULOSIS, SIGMOID COLON 03/31/2008   Qualifier: Diagnosis of  By: Lind Guest  Noted on abdominal and pelvic scan in 04/2011.  Extensive colonic diverticulosis , and possible muscular hypertrophy at rectosigmoid junction    Family hx of colon cancer 11/09/2013   Functional constipation 2010   GERD (gastroesophageal reflux disease)    HTN (hypertension)    Hyperlipemia    Pancreatic cyst 02/05/2013   Renal cyst 05/27/2011   Right eye trauma 1949   Loss of vision resulted at age 56   Schatzki's ring Dec 2010   Skin lesions, generalized 06/19/2015   Tubular adenoma 11/09/2013    Past Surgical History:  Procedure Laterality Date   ABDOMINAL HYSTERECTOMY     BREAST REDUCTION SURGERY  1992 BIL   COLONOSCOPY  DEC 2010   SIMPLE ADENOMAS (6-8), [place] TICS, SML IH   COLONOSCOPY  N/A 12/08/2013   Procedure: COLONOSCOPY;  Surgeon: Malissa Hippo, MD;  [facility];  Service: Endoscopy;  Laterality: N/A;  225   COLONOSCOPY WITH PROPOFOL N/A 07/08/2023   Procedure: COLONOSCOPY WITH PROPOFOL;  Surgeon: Dolores Frame, MD;  [facility];  Service: Gastroenterology;  Laterality: N/A;  9:15am; asa 3   ESOPHAGOGASTRODUODENOSCOPY (EGD) WITH PROPOFOL N/A 01/13/2024   Procedure: ESOPHAGOGASTRODUODENOSCOPY (EGD) WITH PROPOFOL;  Surgeon: Dolores Frame, MD;  [facility];  Service: Gastroenterology;  Laterality: N/A;  2:30PM;ASA 3   EVISCERATION Right 05/08/2015   Procedure: EVISCERATION WITH IMPLANT RIGHT EYE;  Surgeon: Susa Simmonds, MD;  Location: [facility];  Service: Ophthalmology;  Laterality: Right;   EYE SURGERY  RIGHT 2005   EYE SURGERY  right 2016   removed and false eye placed in 08/2015   HEMOSTASIS CLIP PLACEMENT  07/08/2023   Procedure: HEMOSTASIS CLIP PLACEMENT;  Surgeon: Dolores Frame, MD;  [facility];  Service: Gastroenterology;;   POLYPECTOMY  07/08/2023   Procedure: POLYPECTOMY;  Surgeon: Dolores Frame, MD;  [facility];  Service: Gastroenterology;;   SUBMUCOSAL LIFTING INJECTION  07/08/2023   Procedure: SUBMUCOSAL LIFTING INJECTION;  Surgeon: Dolores Frame, MD;  [facility];  Service: Gastroenterology;;   SUBMUCOSAL TATTOO INJECTION  07/08/2023   Procedure: SUBMUCOSAL TATTOO INJECTION;  Surgeon: Marguerita Merles, Reuel Boom, MD;  [facility];  Service: Gastroenterology;;   TOTAL ABDOMINAL HYSTERECTOMY W/ BILATERAL SALPINGOOPHORECTOMY  1985 FIBROIDS   UPPER GASTROINTESTINAL ENDOSCOPY  DEC 2010   Bx-MILD GASTRITIS/DUODENITIS, DUODENAL DIVERTICULA, SCHATZKI'S RING    Current Outpatient Medications  Medication Sig Dispense Refill   Calcium Carbonate Antacid (TUMS PO) Take 1 tablet by mouth 3 (three) times daily as needed.     Cholecalciferol (VITAMIN D3 PO) Take 25 mcg by mouth daily.     clobetasol ointment (TEMOVATE) 0.05 % Use bid x 2 weeks then 2 x weekly on external tissue of vulva 30 g 1   conjugated estrogens (PREMARIN) vaginal cream Place 1 Applicatorful vaginally every other day. 42.5 g 5   diphenhydrAMINE (BENADRYL ALLERGY) 25 mg capsule Take 1 capsule (25 mg total) by mouth every 6 (six) hours as needed. 30 capsule 0   donepezil (ARICEPT) 10 MG tablet Take 1 tablet (10 mg total) by mouth at bedtime. 30 tablet 3    fexofenadine (ALLEGRA ALLERGY) 180 MG tablet Take 1 tablet (180 mg total) by mouth every morning. 30 tablet 5   fluticasone (FLONASE) 50 MCG/ACT nasal spray Place 1 spray into both nostrils daily. 16 g 5   imipramine (TOFRANIL) 25 MG tablet Take 50 mg by mouth at bedtime.     Multiple Vitamin (MULTIVITAMIN PO) Take by mouth.     OVER THE COUNTER MEDICATION Multi Collagen Protein - Patient states that she takes 1 scoop a day.     OVER THE COUNTER MEDICATION One daily hylands leg cramp relief     OVER THE COUNTER MEDICATION Omega xl joint and muscle support daily     prednisoLONE acetate (PRED FORTE) 1 % ophthalmic suspension SMARTSIG:In Eye(s)     ramipril (ALTACE) 10 MG capsule Take one capsule by mouth once daily for blood pressure 90 capsule 3   Current Facility-Administered Medications  Medication Dose Route Frequency Provider Last Rate Last Admin   omalizumab Geoffry Paradise) prefilled syringe 300 mg  300 mg Subcutaneous Q28 days Alfonse Spruce, MD   300 mg at 09/15/23 [phone]  Allergies as of 02/03/2024 - Review Complete 02/03/2024  Allergen Reaction Noted   Codeine  07/22/2015   Crestor [rosuvastatin] Other (See Comments) 10/26/2021   Fosamax [alendronate sodium]  12/31/2013    Family History  Problem Relation Age of Onset   Pancreatic cancer Sister    Colon cancer Paternal Aunt    Colon polyps Neg Hx     Social History   Socioeconomic History   Marital status: Married    Spouse name: Onalee Hua    Number of children: 3   Years of education: 12   Highest education level: 12th grade  Occupational History   Occupation: retired   Tobacco Use   Smoking status: Never    Passive exposure: Past   Smokeless tobacco: Never  Vaping Use   Vaping status: Never Used  Substance and Sexual Activity   Alcohol use: No    Alcohol/week: 0.0 standard drinks of alcohol   Drug use: No   Sexual activity: Not Currently    Birth control/protection: Post-menopausal, Surgical    Comment:  hysterectomy  Other Topics Concern   Not on file  Social History Narrative   MARRIED TO DAVID Owensby. 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/   Social Drivers of Corporate investment banker Strain: Low Risk  (12/09/2023)   Overall Financial Resource Strain (CARDIA)    Difficulty of Paying Living Expenses: Not hard at all  Food Insecurity: No Food Insecurity (12/09/2023)   Hunger Vital Sign    Worried About Running Out of Food in the Last Year: Never true    Ran Out of Food in the Last Year: Never true  Transportation Needs: No Transportation Needs (12/09/2023)   PRAPARE - Administrator, Civil Service (Medical): No    Lack of Transportation (Non-Medical): No  Physical Activity: Sufficiently Active (12/09/2023)   Exercise Vital Sign    Days of Exercise per Week: 5 days    Minutes of Exercise per Session: 60 min  Stress: No Stress Concern Present (12/09/2023)   [employer] of Occupational Health - Occupational Stress Questionnaire    Feeling of Stress : Not at all  Social Connections: Socially Integrated (12/09/2023)   Social Connection and Isolation Panel [NHANES]    Frequency of Communication with Friends and Family: Three times a week    Frequency of Social Gatherings with Friends and Family: Twice a week    Attends Religious Services: More than 4 times per year    Active Member of [employer] or Organizations: Yes    Attends Engineer, structural: More than 4 times per year    Marital Status: Married    Review of systems General: negative for malaise, night sweats, fever, chills, weight loss Neck: Negative for lumps, goiter, pain and significant neck swelling Resp: Negative for cough, wheezing, dyspnea at rest CV: Negative for chest pain, leg swelling, palpitations, orthopnea GI: denies melena, hematochezia, nausea, vomiting, diarrhea, constipation, dysphagia, odyonophagia, early satiety or unintentional weight loss. +Belching/passage of flatus  The remainder of the  review of systems is noncontributory.  Physical Exam: There were no vitals taken for this visit. General:   Alert and oriented. No distress noted. Pleasant and cooperative.  Head:  Normocephalic and atraumatic. Eyes:  Conjuctiva clear without scleral icterus. Mouth:  Oral mucosa pink and moist. Good dentition. No lesions. Heart: Normal rate and rhythm, s1 and s2 heart sounds present.  Lungs: Clear lung sounds in all lobes. Respirations equal and unlabored. Abdomen:  +BS, soft, mildly tender, diffusely,  non-distended. No rebound or guarding. No HSM or masses noted.. Neurologic:  Alert and  oriented x4 Psych:  Alert and cooperative. Normal mood and affect.  Invalid input(s): "6 MONTHS"   ASSESSMENT: ZAWADI APLIN is a 89 y.o. female presenting today for ongoing belching and flatulence  Patient last seen in January with Dr. Levon Hedger for ongoing belching and flatulence, she is scheduled for EGD which was unremarkable, she was then referred for SIBO testing at [facility] which she thinks is scheduled for March.  She has tried Phazyme/Gas-X without improvement in her symptoms.  She was hoping we could provide something that would give her relief today, however I discussed with her that it is important that we have SIBO breath testing to rule out bacterial overgrowth which would need to be treated with a special course of antibiotics first.  At this time I recommend she continue with PPI twice daily and can try over-the-counter FD guard to see if this helps with her symptoms though I did make her aware she should be mindful that sometimes this can worsen reflux symptoms, if she were to have this occur she would need to stop using FDgard.   PLAN:  -Proceed with SIBO testing as previously recommended -Continue PPI twice daily -Can try over-the-counter FD guard  All questions were answered, patient verbalized understanding and is in agreement with plan as outlined above.   Follow Up: Has follow-up  in July  Salem Lembke L. Jeanmarie Hubert, MSN, APRN, AGNP-C Adult-Gerontology Nurse Practitioner [facility]  I have reviewed the note and agree with the APP's assessment as described in this progress note  [name], MD Gastroenterology and Hepatology [facility]

## 2024-02-03 NOTE — Patient Instructions (Signed)
Continue with omeprazole 40mg  twice daily for now We will await breath testing at [facility], further recommendations to follow once this has been completed and we have results You can try over the counter FD gard to see if this helps  You have follow up scheduled in July, we will plan to keep this for now

## 2024-02-03 NOTE — Progress Notes (Signed)
[facility] PREP Weekly Session  Patient Details  Name: Kelsey Nelson MRN: [id number] Date of Birth: Sep 04, 1934 Age: 89 y.o. PCP: Kerri Perches, MD  There were no vitals filed for this visit.   [facility] Weekly seesion - 02/03/24 1500       [facility] "PREP" Location   [facility] Location [facility]      Weekly Session   Topic Discussed Hitting roadblocks   Tips to stay on track and remain motivated, Fit and Strong survey, [employer] talk by Irving Burton, Final FIT Testing   Classes attended to date 34             Remo Lipps 02/03/2024, 4:05 PM

## 2024-02-06 DIAGNOSIS — H0102B Squamous blepharitis left eye, upper and lower eyelids: Secondary | ICD-10-CM | POA: Diagnosis not present

## 2024-02-06 DIAGNOSIS — H0102A Squamous blepharitis right eye, upper and lower eyelids: Secondary | ICD-10-CM | POA: Diagnosis not present

## 2024-02-06 DIAGNOSIS — H40052 Ocular hypertension, left eye: Secondary | ICD-10-CM | POA: Diagnosis not present

## 2024-02-06 DIAGNOSIS — Z961 Presence of intraocular lens: Secondary | ICD-10-CM | POA: Diagnosis not present

## 2024-02-10 ENCOUNTER — Encounter: Payer: Self-pay | Admitting: *Deleted

## 2024-02-11 ENCOUNTER — Encounter: Payer: Self-pay | Admitting: Family Medicine

## 2024-02-11 ENCOUNTER — Ambulatory Visit (INDEPENDENT_AMBULATORY_CARE_PROVIDER_SITE_OTHER): Payer: PPO | Admitting: Family Medicine

## 2024-02-11 VITALS — BP 121/72 | HR 83 | Ht 65.0 in | Wt 123.0 lb

## 2024-02-11 DIAGNOSIS — L819 Disorder of pigmentation, unspecified: Secondary | ICD-10-CM

## 2024-02-11 DIAGNOSIS — Z780 Asymptomatic menopausal state: Secondary | ICD-10-CM | POA: Diagnosis not present

## 2024-02-11 DIAGNOSIS — H9319 Tinnitus, unspecified ear: Secondary | ICD-10-CM

## 2024-02-11 DIAGNOSIS — H9190 Unspecified hearing loss, unspecified ear: Secondary | ICD-10-CM

## 2024-02-11 DIAGNOSIS — Z0001 Encounter for general adult medical examination with abnormal findings: Secondary | ICD-10-CM | POA: Diagnosis not present

## 2024-02-11 DIAGNOSIS — Z1231 Encounter for screening mammogram for malignant neoplasm of breast: Secondary | ICD-10-CM | POA: Diagnosis not present

## 2024-02-11 NOTE — Assessment & Plan Note (Signed)
Refer dermatology for eval

## 2024-02-11 NOTE — Patient Instructions (Addendum)
 F/u in 16 to 18 weeks, call if you need me sooner  Please schedule dexa and mammogram at checkout  Keep up great health habits  Thankful sleep has improved with medication that you take  You are referred to Dermatology for evaluation of the many hyperpigmente spots you have   Best for Winter to Spring!  Thanks for choosing [facility], we consider it a privelige to serve you.

## 2024-02-11 NOTE — Progress Notes (Signed)
[facility] PREP Evaluation  Patient Details  Name: Kelsey Nelson MRN: [id number] Date of Birth: Aug 05, 1934 Age: 89 y.o. PCP: Kerri Perches, MD  Vitals:   02/10/24 1130  BP: 122/62  Pulse: 82  Resp: 20  SpO2: 97%  Weight: 122 lb 6.4 oz (55.5 kg)  Height: 5\' 5"  (1.651 m)     [facility] Eval - 02/10/24 1130       [facility] "PREP" Location   [facility] "PREP[facility] Location [facility]      Referral    Referring Provider Simpson    Reason for referral Hypertension    Program Start Date 11/11/23    Program End Date 02/10/24      Measurement   Waist Circumference 32 inches    Waist Circumference End Program 31 inches    Hip Circumference 32 inches    Hip Circumference End Program 31 inches    Body fat 35.5 percent      Mobility and Daily Activities   I find it easy to walk up or down two or more flights of stairs. 4    I have no trouble taking out the trash. 4    I do housework such as vacuuming and dusting on my own without difficulty. 3    I can easily lift a gallon of milk (8lbs). 4    I can easily walk a mile. 4    I have no trouble reaching into high cupboards or reaching down to pick up something from the floor. 4    I do not have trouble doing out-door work such as Loss adjuster, chartered, raking leaves, or gardening. 1      Mobility and Daily Activities   I feel younger than my age. 3    I feel independent. 3    I feel energetic. 4    I live an active life.  3    I feel strong. 2    I feel healthy. 3    I feel active as other people my age. 4      How fit and strong are you.   Fit and Strong Total Score 46            Past Medical History:  Diagnosis Date   Acute cystitis with hematuria 10/26/2013   Allergic rhinitis 11/08/2017   Blindness of right eye    BMI between 19-24,adult 2010 145 LBS   Diverticula of small intestine Dec 2010   Diverticulosis    [place]   DIVERTICULOSIS, SIGMOID COLON 03/31/2008   Qualifier: Diagnosis of  By: Lind Guest  Noted on abdominal and  pelvic scan in 04/2011.  Extensive colonic diverticulosis , and possible muscular hypertrophy at rectosigmoid junction    Family hx of colon cancer 11/09/2013   Functional constipation 2010   GERD (gastroesophageal reflux disease)    HTN (hypertension)    Hyperlipemia    Pancreatic cyst 02/05/2013   Renal cyst 05/27/2011   Right eye trauma 1949   Loss of vision resulted at age 27   Schatzki's ring Dec 2010   Skin lesions, generalized 06/19/2015   Tubular adenoma 11/09/2013   Past Surgical History:  Procedure Laterality Date   ABDOMINAL HYSTERECTOMY     BREAST REDUCTION SURGERY  1992 BIL   COLONOSCOPY  DEC 2010   SIMPLE ADENOMAS (6-8), [place] TICS, SML IH   COLONOSCOPY N/A 12/08/2013   Procedure: COLONOSCOPY;  Surgeon: Malissa Hippo, MD;  [facility];  Service: Endoscopy;  Laterality:  N/A;  225   COLONOSCOPY WITH PROPOFOL N/A 07/08/2023   Procedure: COLONOSCOPY WITH PROPOFOL;  Surgeon: Dolores Frame, MD;  [facility];  Service: Gastroenterology;  Laterality: N/A;  9:15am; asa 3   ESOPHAGOGASTRODUODENOSCOPY (EGD) WITH PROPOFOL N/A 01/13/2024   Procedure: ESOPHAGOGASTRODUODENOSCOPY (EGD) WITH PROPOFOL;  Surgeon: Dolores Frame, MD;  [facility];  Service: Gastroenterology;  Laterality: N/A;  2:30PM;ASA 3   EVISCERATION Right 05/08/2015   Procedure: EVISCERATION WITH IMPLANT RIGHT EYE;  Surgeon: Susa Simmonds, MD;  Location: [facility];  Service: Ophthalmology;  Laterality: Right;   EYE SURGERY  RIGHT 2005   EYE SURGERY  right 2016   removed and false eye placed in 08/2015   HEMOSTASIS CLIP PLACEMENT  07/08/2023   Procedure: HEMOSTASIS CLIP PLACEMENT;  Surgeon: Dolores Frame, MD;  [facility];  Service: Gastroenterology;;   POLYPECTOMY  07/08/2023   Procedure: POLYPECTOMY;  Surgeon: Dolores Frame, MD;  [facility];  Service: Gastroenterology;;   SUBMUCOSAL LIFTING INJECTION  07/08/2023    Procedure: SUBMUCOSAL LIFTING INJECTION;  Surgeon: Dolores Frame, MD;  [facility];  Service: Gastroenterology;;   SUBMUCOSAL TATTOO INJECTION  07/08/2023   Procedure: SUBMUCOSAL TATTOO INJECTION;  Surgeon: Dolores Frame, MD;  [facility];  Service: Gastroenterology;;   TOTAL ABDOMINAL HYSTERECTOMY W/ BILATERAL SALPINGOOPHORECTOMY  1985 FIBROIDS   UPPER GASTROINTESTINAL ENDOSCOPY  DEC 2010   Bx-MILD GASTRITIS/DUODENITIS, DUODENAL DIVERTICULA, SCHATZKI'S RING   Social History   Tobacco Use  Smoking Status Never   Passive exposure: Past  Smokeless Tobacco Never    Remo Lipps 02/11/2024, 10:21 AM   PREP Complete, Attended 10 of 11 educational lessons and 8 of 11 exercise sessions. Noted improvement in FIT testing, Increased 2 minute cardio march by 25 steps, sit to stands by 1 in 30 seconds bicep curls by 5 in 30 seconds from the start of the program. Balance improved. Encouraged to continue to practice balance exercises. Weight loss 4.2 lbs. Waist/hip inches lost 1'/3.5". Lumi was at a healthy BMI at the start of class and does not need to lose weight. She has been troubled by some recent GI upset which she has been following up with her MD. We discussed the importance of nutritional intake and making sure she is getting enough protein in her diet. Rosey reports walking 3 miles every day. I have encouraged her to continue with strength training exercises that she learned in class to build muscle.  She is motivated to add this to her routine. Great work, Zakiyyah!

## 2024-02-15 DIAGNOSIS — Z0001 Encounter for general adult medical examination with abnormal findings: Secondary | ICD-10-CM | POA: Insufficient documentation

## 2024-02-15 NOTE — Assessment & Plan Note (Signed)
 Annual exam as documented. . Immunization and cancer screening needs are specifically addressed at this visit.

## 2024-02-15 NOTE — Progress Notes (Signed)
    MERELIN HUMAN     MRN: [id number]      DOB: Dec 23, 1934  Chief Complaint  Patient presents with   Annual Exam    cpe    HPI: Patient is in for annual physical exam. No other health concerns are expressed or addressed at the visit. Recent labs,  are reviewed. Immunization is reviewed , and  updated if needed.   PE: BP 121/72 (BP Location: Right Arm, Patient Position: Sitting, Cuff Size: Large)   Pulse 83   Ht 5\' 5"  (1.651 m)   Wt 123 lb (55.8 kg)   SpO2 94%   BMI 20.47 kg/m   Pleasant  female, alert and oriented x 3, in no cardio-pulmonary distress. Afebrile. HEENT No facial trauma or asymetry. Sinuses non tender.  Extra occullar muscles intact.. External ears normal, . Neck: supple, no adenopathy,JVD or thyromegaly.No bruits.  Chest: Clear to ascultation bilaterally.No crackles or wheezes. Non tender to palpation    Cardiovascular system; Heart sounds normal,  S1 and  S2 ,no S3.  No murmur, or thrill. Apical beat not displaced Peripheral pulses normal.  Abdomen: Soft, non tender, no organomegaly or masses.    Musculoskeletal exam: Full ROM of spine, hips , shoulders and knees. No deformity ,swelling or crepitus noted. No muscle wasting or atrophy.   Neurologic: Cranial nerves 3 to 12 intact. Power, tone ,sensation normal throughout. No disturbance in gait. No tremor.  Skin: Intact, no ulceration, erythema , scaling or rash noted. Multiple hyperpigmented macula papular lesions on trunkPsych; Normal mood and affect. Judgement and concentration normal   Assessment & Plan:  Pigmented skin lesions Refer dermatology for eval  Encounter for Medicare annual examination with abnormal findings Annual exam as documented.  Immunization and cancer screening needs are specifically addressed at this visit.

## 2024-02-23 ENCOUNTER — Ambulatory Visit (HOSPITAL_COMMUNITY)
Admission: RE | Admit: 2024-02-23 | Discharge: 2024-02-23 | Disposition: A | Discharge status: Home / Self Care | Payer: PPO | Source: Ambulatory Visit | Attending: Family Medicine | Admitting: Family Medicine

## 2024-02-23 DIAGNOSIS — Z780 Asymptomatic menopausal state: Secondary | ICD-10-CM | POA: Insufficient documentation

## 2024-02-23 DIAGNOSIS — Z1231 Encounter for screening mammogram for malignant neoplasm of breast: Secondary | ICD-10-CM | POA: Diagnosis not present

## 2024-02-23 DIAGNOSIS — M810 Age-related osteoporosis without current pathological fracture: Secondary | ICD-10-CM | POA: Diagnosis not present

## 2024-02-24 DIAGNOSIS — Z1283 Encounter for screening for malignant neoplasm of skin: Secondary | ICD-10-CM | POA: Diagnosis not present

## 2024-02-24 DIAGNOSIS — D225 Melanocytic nevi of trunk: Secondary | ICD-10-CM | POA: Diagnosis not present

## 2024-03-09 DIAGNOSIS — R142 Eructation: Secondary | ICD-10-CM | POA: Diagnosis not present

## 2024-03-09 DIAGNOSIS — R140 Abdominal distension (gaseous): Secondary | ICD-10-CM | POA: Diagnosis not present

## 2024-03-12 ENCOUNTER — Encounter (INDEPENDENT_AMBULATORY_CARE_PROVIDER_SITE_OTHER): Payer: Self-pay | Admitting: *Deleted

## 2024-03-12 ENCOUNTER — Telehealth: Payer: Self-pay | Admitting: Internal Medicine

## 2024-03-12 NOTE — Telephone Encounter (Signed)
 Called patient to schedule Xolair reapproval appointment. No voicemail was set up, could not leave message.

## 2024-03-16 NOTE — Addendum Note (Signed)
 Addended by: Kerri Perches on: 03/16/2024 02:25 PM   Modules accepted: Orders

## 2024-03-17 ENCOUNTER — Telehealth: Payer: Self-pay | Admitting: Gastroenterology

## 2024-03-17 DIAGNOSIS — K638219 Small intestinal bacterial overgrowth, unspecified: Secondary | ICD-10-CM

## 2024-03-17 MED ORDER — RIFAXIMIN 550 MG PO TABS: 550.0000 mg | ORAL_TABLET | Freq: Three times a day (TID) | ORAL | 0 refills | Status: DC

## 2024-03-17 NOTE — Telephone Encounter (Signed)
 I received the results of the most recent SIBO breath test which came back with mild elevation of hydrogen which could suggest SIBO in the foregut. She would benefit from a trial of Xifaxan for 2 weeks.  This will be sent to her pharmacy.  Crystal, please let her know we will send her a prescription for 2 weeks of this medication to treat her symptoms.

## 2024-03-17 NOTE — Telephone Encounter (Signed)
 I spoke with the patient and made her aware per Dr. Levon Hedger:  I received the results of the most recent SIBO breath test which came back with mild elevation of hydrogen which could suggest SIBO in the foregut. She would benefit from a trial of Xifaxan for 2 weeks.  This will be sent to her pharmacy.     Patient states understanding.

## 2024-03-25 ENCOUNTER — Other Ambulatory Visit: Payer: Self-pay

## 2024-03-25 ENCOUNTER — Telehealth: Payer: Self-pay | Admitting: Family Medicine

## 2024-03-25 DIAGNOSIS — M818 Other osteoporosis without current pathological fracture: Secondary | ICD-10-CM

## 2024-03-25 DIAGNOSIS — N1831 Chronic kidney disease, stage 3a: Secondary | ICD-10-CM

## 2024-03-25 MED ORDER — RAMIPRIL 10 MG PO CAPS: ORAL_CAPSULE | ORAL | 3 refills | Status: AC

## 2024-03-25 NOTE — Telephone Encounter (Signed)
 Please call and let her know I am setting up reclast treatment  NEEDS to commit to calcium 1200 mg or more daily States in record she is taking Tums 3 times daily so that is enough  NEEDS t commit to Vit D3, at least 800 units daily or more  Needs BMP and EGFR non fasting drawn, hopefully this week, no later than next week Tuesday sp we can get the order through and treatment started

## 2024-03-25 NOTE — Telephone Encounter (Signed)
 Tried calling, n/a. Lvm

## 2024-03-25 NOTE — Telephone Encounter (Signed)
 Pt informed and verbalized understanding. Will come in tomorrow morning for labs. Orders submitted.

## 2024-03-26 ENCOUNTER — Telehealth: Payer: Self-pay | Admitting: Family Medicine

## 2024-03-26 NOTE — Telephone Encounter (Signed)
 Copied from CRM [id number]. Topic: General - Other >> Mar 25, 2024  3:22 PM Antwanette L wrote: Reason for CRM: Patient is retuning a missed call from the office. Please have Tula Nakayama call the patient back at [phone].

## 2024-03-31 ENCOUNTER — Other Ambulatory Visit: Payer: Self-pay

## 2024-03-31 ENCOUNTER — Other Ambulatory Visit: Payer: Self-pay | Admitting: Urology

## 2024-03-31 ENCOUNTER — Ambulatory Visit: Payer: PPO | Admitting: Adult Health

## 2024-03-31 DIAGNOSIS — N390 Urinary tract infection, site not specified: Secondary | ICD-10-CM

## 2024-03-31 DIAGNOSIS — N3281 Overactive bladder: Secondary | ICD-10-CM

## 2024-03-31 MED ORDER — ESTRADIOL 0.1 MG/GM VA CREA
TOPICAL_CREAM | VAGINAL | 3 refills | Status: AC
Start: 2024-03-31 — End: ?

## 2024-03-31 NOTE — Telephone Encounter (Signed)
 LABS HAVE NOT BEEN DRAWN AS OF 4/9, PT NEEDS TO HAVE THIS DONE BEFORE SHE CAN START TREATMENT WITH RECLAST

## 2024-04-02 ENCOUNTER — Telehealth: Payer: Self-pay | Admitting: Family Medicine

## 2024-04-02 NOTE — Telephone Encounter (Signed)
 Dr Lodema Hong is wanting Reclast infusion set up for this pt and it needs preauthorization. Can you please assist.  Thanks

## 2024-04-05 ENCOUNTER — Ambulatory Visit
Admission: EM | Admit: 2024-04-05 | Discharge: 2024-04-05 | Disposition: A | Discharge status: Home / Self Care | Attending: Family Medicine | Admitting: Family Medicine

## 2024-04-05 DIAGNOSIS — J22 Unspecified acute lower respiratory infection: Secondary | ICD-10-CM | POA: Diagnosis not present

## 2024-04-05 MED ORDER — BENZONATATE 200 MG PO CAPS: 200.0000 mg | ORAL_CAPSULE | Freq: Three times a day (TID) | ORAL | 0 refills | Status: DC | PRN

## 2024-04-05 MED ORDER — GUAIFENESIN ER 600 MG PO TB12: 600.0000 mg | ORAL_TABLET | Freq: Two times a day (BID) | ORAL | 0 refills | Status: DC

## 2024-04-05 MED ORDER — AZITHROMYCIN 250 MG PO TABS: ORAL_TABLET | ORAL | 0 refills | Status: DC

## 2024-04-05 NOTE — ED Triage Notes (Signed)
 Cough, chest soreness from cough, congestion x 2 weeks. Taking cough drops, and OTC allergy medication.

## 2024-04-06 NOTE — ED Provider Notes (Signed)
 [facility]    CSN: [id number] Arrival date & time: 04/05/24  0803      History   Chief Complaint Chief Complaint  Patient presents with   Cough    HPI Kelsey Nelson is a 89 y.o. female.   Patient presenting today with 2-week history of progressively worsening cough, chest tightness, mild shortness of breath, congestion, sinus pressure.  Denies fever, chills, chest pain, abdominal pain, vomiting, diarrhea.  So far trying cough drops and allergy regimen with minimal relief.  No known history of chronic pulmonary disease.    Past Medical History:  Diagnosis Date   Acute cystitis with hematuria 10/26/2013   Allergic rhinitis 11/08/2017   Blindness of right eye    BMI between 19-24,adult 2010 145 LBS   Diverticula of small intestine Dec 2010   Diverticulosis    [place]   DIVERTICULOSIS, SIGMOID COLON 03/31/2008   Qualifier: Diagnosis of  By: Almetta Jacquet  Noted on abdominal and pelvic scan in 04/2011.  Extensive colonic diverticulosis , and possible muscular hypertrophy at rectosigmoid junction    Family hx of colon cancer 11/09/2013   Functional constipation 2010   GERD (gastroesophageal reflux disease)    HTN (hypertension)    Hyperlipemia    Pancreatic cyst 02/05/2013   Renal cyst 05/27/2011   Right eye trauma 1949   Loss of vision resulted at age 66   Schatzki's ring Dec 2010   Skin lesions, generalized 06/19/2015   Tubular adenoma 11/09/2013    Patient Active Problem List   Diagnosis Date Noted   Encounter for Medicare annual examination with abnormal findings 02/15/2024   Pigmented skin lesions 02/11/2024   Vaginal discomfort 01/22/2024   Oral pain 01/19/2024   Colon polyps 01/19/2024   Flatulence 12/26/2023   Lichen sclerosus et atrophicus 12/09/2023   Vulvar itching 12/09/2023   Vaginal itching 12/09/2023   S/P hysterectomy with oophorectomy 12/09/2023   Cystocele, unspecified 12/09/2023   Vaginal vault prolapse 12/09/2023   Pessary maintenance  12/09/2023   Muscle cramps 10/31/2023   Complication of prosthetic eye 08/15/2023   Chronic urticaria 04/24/2023   MCI (mild cognitive impairment) 03/10/2023   Xerostomia 12/13/2022   CKD (chronic kidney disease) stage 3, GFR 30-59 ml/min (HCC) 09/23/2022   Allergic dermatitis 07/19/2022   Food allergy 07/19/2022   Skin lesions 01/22/2022   Abdominal spasms 01/22/2022   Recurrent UTI 05/10/2021   Skipped heart beats 10/19/2020   Anterior knee pain, right 10/05/2019   Burping 10/05/2019   Ectropion of right eye 11/22/2015   Acquired superior sulcus deformity of orbit 07/04/2015   History of evisceration of eye 07/04/2015   Nuclear sclerosis of left eye 07/04/2015   Urinary urgency 10/31/2013   Loss of taste 07/31/2012   Pancreatic cyst 04/21/2012   H/O gastroesophageal reflux (GERD) 04/21/2012   Nocturia 12/02/2011   Insomnia 09/20/2011   Nausea 06/22/2011   Change in bowel habits 06/21/2011   GERD 11/20/2009   Constipation 11/20/2009   Mixed hyperlipidemia 03/31/2008   BLINDNESS, RIGHT EYE 03/31/2008   HTN (hypertension) 03/31/2008   Osteoporosis 03/31/2008    Past Surgical History:  Procedure Laterality Date   ABDOMINAL HYSTERECTOMY     BREAST REDUCTION SURGERY  1992 BIL   COLONOSCOPY  DEC 2010   SIMPLE ADENOMAS (6-8), [place] TICS, SML IH   COLONOSCOPY N/A 12/08/2013   Procedure: COLONOSCOPY;  Surgeon: Ruby Corporal, MD;  [facility]: [facility];  Service: Endoscopy;  Laterality: N/A;  225   COLONOSCOPY  WITH PROPOFOL N/A 07/08/2023   Procedure: COLONOSCOPY WITH PROPOFOL;  Surgeon: Dolores Frame, MD;  Location: [facility];  Service: Gastroenterology;  Laterality: N/A;  9:15am; asa 3   ESOPHAGOGASTRODUODENOSCOPY (EGD) WITH PROPOFOL N/A 01/13/2024   Procedure: ESOPHAGOGASTRODUODENOSCOPY (EGD) WITH PROPOFOL;  Surgeon: Dolores Frame, MD;  Location: [facility];  Service: Gastroenterology;  Laterality: N/A;  2:30PM;ASA 3   EVISCERATION Right  05/08/2015   Procedure: EVISCERATION WITH IMPLANT RIGHT EYE;  Surgeon: Susa Simmonds, MD;  Location: [facility];  Service: Ophthalmology;  Laterality: Right;   EYE SURGERY  RIGHT 2005   EYE SURGERY  right 2016   removed and false eye placed in 08/2015   HEMOSTASIS CLIP PLACEMENT  07/08/2023   Procedure: HEMOSTASIS CLIP PLACEMENT;  Surgeon: Dolores Frame, MD;  [facility]: [facility];  Service: Gastroenterology;;   POLYPECTOMY  07/08/2023   Procedure: POLYPECTOMY;  Surgeon: Dolores Frame, MD;  [facility];  Service: Gastroenterology;;   SUBMUCOSAL LIFTING INJECTION  07/08/2023   Procedure: SUBMUCOSAL LIFTING INJECTION;  Surgeon: Dolores Frame, MD;  [facility];  Service: Gastroenterology;;   SUBMUCOSAL TATTOO INJECTION  07/08/2023   Procedure: SUBMUCOSAL TATTOO INJECTION;  Surgeon: Dolores Frame, MD;  [facility]: [facility];  Service: Gastroenterology;;   TOTAL ABDOMINAL HYSTERECTOMY W/ BILATERAL SALPINGOOPHORECTOMY  1985 FIBROIDS   UPPER GASTROINTESTINAL ENDOSCOPY  DEC 2010   Bx-MILD GASTRITIS/DUODENITIS, DUODENAL DIVERTICULA, SCHATZKI'S RING    OB History     Gravida  3   Para  3   Term  3   Preterm      AB      Living  3      SAB      IAB      Ectopic      Multiple      Live Births  3            Home Medications    Prior to Admission medications   Medication Sig Start Date End Date Taking? Authorizing Provider  azithromycin (ZITHROMAX) 250 MG tablet Take first 2 tablets together, then 1 every day until finished. 04/05/24  Yes Particia Nearing, PA-C  benzonatate (TESSALON) 200 MG capsule Take 1 capsule (200 mg total) by mouth 3 (three) times daily as needed for cough. 04/05/24  Yes Particia Nearing, PA-C  Cholecalciferol (VITAMIN D3 PO) Take 25 mcg by mouth daily.   Yes [provider]  estradiol (ESTRACE) 0.1 MG/GM vaginal cream Discard plastic applicator. Insert a  blueberry size amount (approximately 1 gram) of cream on fingertip inside vagina at bedtime every night for 1 week then every other night. For long term use. 03/31/24  Yes Donnita Falls, FNP  fexofenadine (ALLEGRA ALLERGY) 180 MG tablet Take 1 tablet (180 mg total) by mouth every morning. 04/21/23  Yes Birder Robson, MD  fluticasone (FLONASE) 50 MCG/ACT nasal spray Place 1 spray into both nostrils daily. 04/21/23  Yes Birder Robson, MD  guaiFENesin (MUCINEX) 600 MG 12 hr tablet Take 1 tablet (600 mg total) by mouth 2 (two) times daily. 04/05/24  Yes Particia Nearing, PA-C  imipramine (TOFRANIL) 25 MG tablet Take 50 mg by mouth at bedtime. 01/08/24  Yes [provider]  Multiple Vitamin (MULTIVITAMIN PO) Take by mouth.   Yes [provider]  OVER THE COUNTER MEDICATION Multi Collagen Protein - Patient states that she takes 1 scoop a day.   Yes [provider]  OVER THE COUNTER MEDICATION One daily hylands leg cramp relief   Yes [provider]  OVER THE COUNTER MEDICATION Omega xl joint and muscle support daily   Yes [provider]  ramipril (ALTACE) 10 MG capsule Take one capsule by mouth once daily for blood pressure 03/25/24  Yes Towanda Fret, MD  rifaximin (XIFAXAN) 550 MG TABS tablet Take 1 tablet (550 mg total) by mouth 3 (three) times daily. 03/17/24  Yes Castaneda Mayorga, Daniel, MD  Calcium Carbonate Antacid (TUMS PO) Take 1 tablet by mouth 3 (three) times daily as needed.    [provider]  clobetasol ointment (TEMOVATE) 0.05 % Use bid x 2 weeks then 2 x weekly on external tissue of vulva 12/09/23   Javan Messing, NP  diphenhydrAMINE (BENADRYL ALLERGY) 25 mg capsule Takes one at bedtime then 3 to 4 hours later takes a 2nd one 02/11/24   Towanda Fret, MD  donepezil (ARICEPT) 10 MG tablet Take 1 tablet (10 mg total) by mouth at bedtime. 01/19/24   Towanda Fret, MD  prednisoLONE acetate (PRED FORTE) 1 %  ophthalmic suspension SMARTSIG:In Eye(s) 08/21/23   [provider]  zoledronic acid (RECLAST) 5 MG/100ML SOLN injection Inject 100 mLs (5 mg total) into the vein once. 03/16/24   Towanda Fret, MD    Family History Family History  Problem Relation Age of Onset   Pancreatic cancer Sister    Colon cancer Paternal Aunt    Colon polyps Neg Hx     Social History Social History   Tobacco Use   Smoking status: Never    Passive exposure: Past   Smokeless tobacco: Never  Vaping Use   Vaping status: Never Used  Substance Use Topics   Alcohol use: No    Alcohol/week: 0.0 standard drinks of alcohol   Drug use: No     Allergies   Codeine, Crestor [rosuvastatin], and Fosamax [alendronate sodium]   Review of Systems Review of Systems PER HPI  Physical Exam Triage Vital Signs ED Triage Vitals  Encounter Vitals Group     BP 04/05/24 0823 131/73     Systolic BP Percentile --      Diastolic BP Percentile --      Pulse Rate 04/05/24 0823 73     Resp 04/05/24 0823 16     Temp 04/05/24 0823 98 F (36.7 C)     Temp Source 04/05/24 0823 Oral     SpO2 04/05/24 0823 95 %     Weight --      Height --      Head Circumference --      Peak Flow --      Pain Score 04/05/24 0825 8     Pain Loc --      Pain Education --      Exclude from Growth Chart --    No data found.  Updated Vital Signs BP 131/73 (BP Location: Right Arm)   Pulse 73   Temp 98 F (36.7 C) (Oral)   Resp 16   SpO2 95%   Visual Acuity Right Eye Distance:   Left Eye Distance:   Bilateral Distance:    Right Eye Near:   Left Eye Near:    Bilateral Near:     Physical Exam Vitals and nursing note reviewed.  Constitutional:      Appearance: Normal appearance.  HENT:     Head: Atraumatic.     Right Ear: Tympanic membrane and external ear  normal.     Left Ear: Tympanic membrane and external ear normal.     Nose: Congestion present.     Mouth/Throat:     Mouth: Mucous membranes are moist.      Pharynx: Posterior oropharyngeal erythema present.  Eyes:     Extraocular Movements: Extraocular movements intact.     Conjunctiva/sclera: Conjunctivae normal.  Cardiovascular:     Rate and Rhythm: Normal rate and regular rhythm.     Heart sounds: Normal heart sounds.  Pulmonary:     Effort: Pulmonary effort is normal.     Breath sounds: Normal breath sounds. No wheezing.  Musculoskeletal:        General: Normal range of motion.     Cervical back: Normal range of motion and neck supple.  Skin:    General: Skin is warm and dry.  Neurological:     Mental Status: She is alert and oriented to person, place, and time.  Psychiatric:        Mood and Affect: Mood normal.        Thought Content: Thought content normal.      UC Treatments / Results  Labs (all labs ordered are listed, but only abnormal results are displayed) Labs Reviewed - No data to display  EKG   Radiology No results found.  Procedures Procedures (including critical care time)  Medications Ordered in UC Medications - No data to display  Initial Impression / Assessment and Plan / UC Course  I have reviewed the triage vital signs and the nursing notes.  Pertinent labs & imaging results that were available during my care of the patient were reviewed by me and considered in my medical decision making (see chart for details).     Given duration and worsening course, will treat with Zithromax, Mucinex, Tessalon and supportive over-the-counter medications and home care.  Return for worsening symptoms.  Final Clinical Impressions(s) / UC Diagnoses   Final diagnoses:  Lower respiratory infection   Discharge Instructions   None    ED Prescriptions     Medication Sig Dispense Auth. Provider   azithromycin (ZITHROMAX) 250 MG tablet Take first 2 tablets together, then 1 every day until finished. 6 tablet Esbeidy Mclaine Elizabeth, PA-C   benzonatate (TESSALON) 200 MG capsule Take 1 capsule (200 mg total) by  mouth 3 (three) times daily as needed for cough. 20 capsule Corbin Dess, PA-C   guaiFENesin (MUCINEX) 600 MG 12 hr tablet Take 1 tablet (600 mg total) by mouth 2 (two) times daily. 20 tablet Corbin Dess, [place]      PDMP not reviewed this encounter.   Corbin Dess, [place] 04/06/24 [phone]

## 2024-04-08 ENCOUNTER — Telehealth: Payer: Self-pay

## 2024-04-08 NOTE — Telephone Encounter (Signed)
 Findings of benefits investigation for Reclast- [id number]:   Insurance: [employer] ADVANTAGE/RX ADVANCE   Phone: [phone]    Medicare Advantage: Plan follows Medicare guidelines and covers 80% of the infusion and no authorization is required for J3489 as long as prescribed with FDA approved indication. [facility] is in Network. Patient does not have Medicare Supplement Plan, so patient will be required to pay the 20% coinsurance per each infusion.

## 2024-04-08 NOTE — Telephone Encounter (Signed)
 Reclast benefit verification completed in separate encounter. Please see telephone encounter on 04/08/24.

## 2024-04-09 NOTE — Telephone Encounter (Signed)
 Referral sent to infusion center at [phone]

## 2024-04-13 ENCOUNTER — Encounter: Payer: Self-pay | Admitting: Adult Health

## 2024-04-13 ENCOUNTER — Ambulatory Visit: Admitting: Adult Health

## 2024-04-13 VITALS — BP 138/76 | HR 66 | Ht 65.0 in | Wt 124.0 lb

## 2024-04-13 DIAGNOSIS — Z4689 Encounter for fitting and adjustment of other specified devices: Secondary | ICD-10-CM

## 2024-04-13 DIAGNOSIS — N8110 Cystocele, unspecified: Secondary | ICD-10-CM

## 2024-04-13 DIAGNOSIS — N819 Female genital prolapse, unspecified: Secondary | ICD-10-CM | POA: Diagnosis not present

## 2024-04-13 DIAGNOSIS — R232 Flushing: Secondary | ICD-10-CM | POA: Diagnosis not present

## 2024-04-13 DIAGNOSIS — N949 Unspecified condition associated with female genital organs and menstrual cycle: Secondary | ICD-10-CM | POA: Diagnosis not present

## 2024-04-13 NOTE — Progress Notes (Signed)
  Subjective:     Patient ID: Kelsey Nelson, female   DOB: 03/11/1934, 89 y.o.   MRN: [id number]  HPI Kimi is a 89 year old white female, married, sp hysterectomy in for pessary maintenance. Has started having some hot flashes, and has some discomfort in vaginal area.  PCP is Dr Rodolph Clap  Review of Systems +hot flashes +discomfort in vaginal area Reviewed past medical,surgical, social and family history. Reviewed medications and allergies.     Objective:   Physical Exam BP 138/76 (BP Location: Left Arm, Patient Position: Sitting, Cuff Size: Normal)   Pulse 66   Ht 5\' 5"  (1.651 m)   Wt 124 lb (56.2 kg)   BMI 20.63 kg/m     Skin warm and dry.Pelvic: external genitalia:vulva is slightly red, thin tissue at introitus, vagina: #2 ring with support removed, and washed with soap and water  and dried, +cystocele and prolapse,no irritation noted, pessary reinserted, urethra has no lesions or masses noted, cervix and uterus are absent, adnexa: no masses or tenderness noted. Bladder is non tender and no masses felt   Upstream - 04/13/24 0909       Pregnancy Intention Screening   Does the patient want to become pregnant in the next year? N/A    Does the patient's partner want to become pregnant in the next year? N/A    Would the patient like to discuss contraceptive options today? N/A      Contraception Wrap Up   Current Method Female Sterilization   hyst   End Method Female Sterilization   hyst   Contraception Counseling Provided No            Examination chaperoned by Alphonso Aschoff LPN  Assessment:     1. Pessary maintenance (Primary) Cleaned and reinserted  2. Cystocele, unspecified  3. Vaginal vault prolapse  4. Vaginal discomfort Feels better with estrogen cream   5. Hot flashes     Plan:     Follow up in 2 months for pessary maintenance

## 2024-04-15 ENCOUNTER — Telehealth (INDEPENDENT_AMBULATORY_CARE_PROVIDER_SITE_OTHER): Payer: Self-pay

## 2024-04-15 NOTE — Telephone Encounter (Signed)
 Based on notes from 03/18/2023, you discussed with the patient the findings.  She was sent rifaximin  course for 2 weeks on the same day (sent to [facility] pharmacy).  Please confirm if she took this antibiotic or not.

## 2024-04-15 NOTE — Telephone Encounter (Signed)
 Patient daughter Kelsey Nelson called today and said the patient had a breath test done through [facility] in March. They would like to know the results.The patient will not be home tomorrow, we may reach the patient daughter [name] at [phone]

## 2024-04-16 NOTE — Progress Notes (Signed)
 Reclast referral faxed to outpt infusion center

## 2024-04-16 NOTE — Telephone Encounter (Signed)
 Me     03/17/24  2:08 PM Note I spoke with the patient and made her aware per Dr. Sammi Crick:  I received the results of the most recent SIBO breath test which came back with mild elevation of hydrogen which could suggest SIBO in the foregut. She would benefit from a trial of Xifaxan  for 2 weeks.  This will be sent to her pharmacy.      Patient states understanding.        03/17/24  1:40 PM Urban Garden, MD routed this conversation to Me  Umberto Ganong, Bearl Limes, MD     03/17/24  1:40 PM Note I received the results of the most recent SIBO breath test which came back with mild elevation of hydrogen which could suggest SIBO in the foregut. She would benefit from a trial of Xifaxan  for 2 weeks.  This will be sent to her pharmacy.   Lile Mccurley, please let her know we will send her a prescription for 2 weeks of this medication to treat her symptoms.

## 2024-04-16 NOTE — Telephone Encounter (Signed)
 Patient daughter aware we spoke with the patient on 03/17/2024 and gave her the results and patient was told at that time to pick up her Xifaxan  at [facility]. The patient daughter says she would like to be the contact person for her mothers calls. She is on DPR and I have had Amalia Badder to make the daughter [name] to the contact person. [name] will check with [facility] to see if they still have the script and if fill. She will let me know of any issues with the cost.

## 2024-04-18 DIAGNOSIS — L309 Dermatitis, unspecified: Secondary | ICD-10-CM | POA: Diagnosis not present

## 2024-04-20 DIAGNOSIS — H1089 Other conjunctivitis: Secondary | ICD-10-CM | POA: Diagnosis not present

## 2024-04-26 DIAGNOSIS — H1045 Other chronic allergic conjunctivitis: Secondary | ICD-10-CM | POA: Diagnosis not present

## 2024-05-05 DIAGNOSIS — J069 Acute upper respiratory infection, unspecified: Secondary | ICD-10-CM | POA: Diagnosis not present

## 2024-05-05 DIAGNOSIS — Z6820 Body mass index (BMI) 20.0-20.9, adult: Secondary | ICD-10-CM | POA: Diagnosis not present

## 2024-05-05 DIAGNOSIS — R059 Cough, unspecified: Secondary | ICD-10-CM | POA: Diagnosis not present

## 2024-05-10 DIAGNOSIS — H0102A Squamous blepharitis right eye, upper and lower eyelids: Secondary | ICD-10-CM | POA: Diagnosis not present

## 2024-05-10 DIAGNOSIS — H40052 Ocular hypertension, left eye: Secondary | ICD-10-CM | POA: Diagnosis not present

## 2024-05-10 DIAGNOSIS — H1045 Other chronic allergic conjunctivitis: Secondary | ICD-10-CM | POA: Diagnosis not present

## 2024-05-10 DIAGNOSIS — H0102B Squamous blepharitis left eye, upper and lower eyelids: Secondary | ICD-10-CM | POA: Diagnosis not present

## 2024-05-31 ENCOUNTER — Ambulatory Visit: Payer: PPO | Admitting: Urology

## 2024-06-07 ENCOUNTER — Ambulatory Visit (INDEPENDENT_AMBULATORY_CARE_PROVIDER_SITE_OTHER): Admitting: Otolaryngology

## 2024-06-07 ENCOUNTER — Ambulatory Visit (INDEPENDENT_AMBULATORY_CARE_PROVIDER_SITE_OTHER): Admitting: Audiology

## 2024-06-07 ENCOUNTER — Encounter (INDEPENDENT_AMBULATORY_CARE_PROVIDER_SITE_OTHER): Payer: Self-pay | Admitting: Otolaryngology

## 2024-06-07 VITALS — BP 126/74 | HR 70 | Wt 125.0 lb

## 2024-06-07 DIAGNOSIS — H903 Sensorineural hearing loss, bilateral: Secondary | ICD-10-CM

## 2024-06-07 DIAGNOSIS — H9313 Tinnitus, bilateral: Secondary | ICD-10-CM

## 2024-06-07 DIAGNOSIS — H9319 Tinnitus, unspecified ear: Secondary | ICD-10-CM | POA: Diagnosis not present

## 2024-06-07 NOTE — Progress Notes (Signed)
  [address] [place], [place] [address] [phone]  Audiological Evaluation    Name: Kelsey Nelson     DOB:   04-12-1934      MRN:   [id number]                                                                                     Service Date: 06/07/2024     Accompanied by: unaccompanied to the booth   Patient comes today after Dr. Darlin Ehrlich, ENT sent a referral for a hearing evaluation due to concerns with hearing loss and tinnitus.   Symptoms Yes Details  Hearing loss  [x]  Maybe because of age  Tinnitus  [x]  Bilateral ringing  Ear pain/ infections/pressure  []    Balance problems  []    Noise exposure history  []    Previous ear surgeries  []    Family history of hearing loss  []    Amplification  []    Other  []      Otoscopy: Right ear: Clear external ear canal and notable landmarks visualized on the tympanic membrane. Left ear:  Clear external ear canal and notable landmarks visualized on the tympanic membrane.  Tympanometry: Right ear: Type A- Normal external ear canal volume with normal middle ear pressure and tympanic membrane compliance. Left ear: Type A- Normal external ear canal volume with normal middle ear pressure and tympanic membrane compliance.    Pure tone Audiometry: Right ear- Normal to severe sensorineural hearing loss from 250 Hz - 8000 Hz. Left ear-  Normal to severe sensorineural hearing loss from 250 Hz - 8000 Hz.  Speech Audiometry: Right ear- Speech Reception Threshold (SRT) was obtained at 30 dBHL. Left ear-Speech Reception Threshold (SRT) was obtained at 30 dBHL.   Word Recognition Score Tested using NU-6 (recorded) Right ear: 100% was obtained at a presentation level of 70 dBHL with contralateral masking which is deemed as  excellent. Left ear: 100% was obtained at a presentation level of 70 dBHL with contralateral masking which is deemed as  excellent.   The hearing test results were completed under headphones and results are deemed to  be of good reliability. Test technique:  conventional     Recommendations: Follow up with ENT as scheduled for today. Return for a hearing evaluation if concerns with hearing changes arise or per MD recommendation. Consider various tinnitus strategies, including the use of a sound generator, hearing aids, and/or tinnitus retraining therapy.  Consider a communication needs assessment after medical clearance for hearing aids is obtained.   Kelsey Nelson, AUD

## 2024-06-08 DIAGNOSIS — H9313 Tinnitus, bilateral: Secondary | ICD-10-CM | POA: Insufficient documentation

## 2024-06-08 DIAGNOSIS — H903 Sensorineural hearing loss, bilateral: Secondary | ICD-10-CM | POA: Insufficient documentation

## 2024-06-08 NOTE — Progress Notes (Signed)
 CC: Bilateral hearing loss, bilateral tinnitus  HPI:  Kelsey Nelson is a 89 y.o. female who presents today complaining of bilateral progressive hearing loss and tinnitus.  According to the patient, she has been symptomatic for many years.  Her previous hearing test in 2012 showed bilateral symmetric high-frequency sensorineural hearing loss.  According to the patient, her tinnitus has increased lately.  She denies any otalgia, otorrhea, or vertigo.  She has never worn hearing aids.  Past Medical History:  Diagnosis Date   Acute cystitis with hematuria 10/26/2013   Allergic rhinitis 11/08/2017   Blindness of right eye    BMI between 19-24,adult 2010 145 LBS   Diverticula of small intestine Dec 2010   Diverticulosis    [place]   DIVERTICULOSIS, SIGMOID COLON 03/31/2008   Qualifier: Diagnosis of  By: Almetta Jacquet  Noted on abdominal and pelvic scan in 04/2011.  Extensive colonic diverticulosis , and possible muscular hypertrophy at rectosigmoid junction    Family hx of colon cancer 11/09/2013   Functional constipation 2010   GERD (gastroesophageal reflux disease)    HTN (hypertension)    Hyperlipemia    Pancreatic cyst 02/05/2013   Renal cyst 05/27/2011   Right eye trauma 1949   Loss of vision resulted at age 62   Schatzki's ring Dec 2010   Skin lesions, generalized 06/19/2015   Tubular adenoma 11/09/2013    Past Surgical History:  Procedure Laterality Date   ABDOMINAL HYSTERECTOMY     BREAST REDUCTION SURGERY  1992 BIL   COLONOSCOPY  DEC 2010   SIMPLE ADENOMAS (6-8), [place] TICS, [facility] IH   COLONOSCOPY N/A 12/08/2013   Procedure: COLONOSCOPY;  Surgeon: Ruby Corporal, MD;  [facility]: [facility];  Service: Endoscopy;  Laterality: N/A;  225   COLONOSCOPY WITH PROPOFOL  N/A 07/08/2023   Procedure: COLONOSCOPY WITH PROPOFOL ;  Surgeon: Urban Garden, MD;  Location: [facility];  Service: Gastroenterology;  Laterality: N/A;  9:15am; asa 3   ESOPHAGOGASTRODUODENOSCOPY (EGD) WITH  PROPOFOL  N/A 01/13/2024   Procedure: ESOPHAGOGASTRODUODENOSCOPY (EGD) WITH PROPOFOL ;  Surgeon: Urban Garden, MD;  [facility];  Service: Gastroenterology;  Laterality: N/A;  2:30PM;ASA 3   EVISCERATION Right 05/08/2015   Procedure: EVISCERATION WITH IMPLANT RIGHT EYE;  Surgeon: Clay Cummins, MD;  Location: [facility];  Service: Ophthalmology;  Laterality: Right;   EYE SURGERY  RIGHT 2005   EYE SURGERY  right 2016   removed and false eye placed in 08/2015   HEMOSTASIS CLIP PLACEMENT  07/08/2023   Procedure: HEMOSTASIS CLIP PLACEMENT;  Surgeon: Urban Garden, MD;  [facility]: [facility];  Service: Gastroenterology;;   POLYPECTOMY  07/08/2023   Procedure: POLYPECTOMY;  Surgeon: Urban Garden, MD;  [facility];  Service: Gastroenterology;;   SUBMUCOSAL LIFTING INJECTION  07/08/2023   Procedure: SUBMUCOSAL LIFTING INJECTION;  Surgeon: Urban Garden, MD;  [facility];  Service: Gastroenterology;;   SUBMUCOSAL TATTOO INJECTION  07/08/2023   Procedure: SUBMUCOSAL TATTOO INJECTION;  Surgeon: Urban Garden, MD;  [facility];  Service: Gastroenterology;;   TOTAL ABDOMINAL HYSTERECTOMY W/ BILATERAL SALPINGOOPHORECTOMY  1985 FIBROIDS   UPPER GASTROINTESTINAL ENDOSCOPY  DEC 2010   Bx-MILD GASTRITIS/DUODENITIS, DUODENAL DIVERTICULA, SCHATZKI'S RING    Family History  Problem Relation Age of Onset   Pancreatic cancer Sister    Colon cancer Paternal Aunt    Colon polyps Neg Hx     Social History:  reports that she has never smoked. She  has been exposed to tobacco smoke. She has never used smokeless tobacco. She reports that she does not drink alcohol and does not use drugs.  Allergies:  Allergies  Allergen Reactions   Codeine    Crestor  [Rosuvastatin ] Other (See Comments)    Can't recall   Fosamax [Alendronate Sodium]     heartburn    Prior to Admission medications   Medication Sig Start  Date End Date Taking? Authorizing Provider  Cholecalciferol (VITAMIN D3 PO) Take 25 mcg by mouth daily.   Yes [provider]  clobetasol  ointment (TEMOVATE ) 0.05 % Use bid x 2 weeks then 2 x weekly on external tissue of vulva 12/09/23  Yes Lendia Quay A, NP  conjugated estrogens  (PREMARIN ) vaginal cream Place 1 applicator vaginally. Every other day   Yes [provider]  diphenhydrAMINE  (BENADRYL  ALLERGY) 25 mg capsule Takes one at bedtime then 3 to 4 hours later takes a 2nd one 02/11/24  Yes Towanda Fret, MD  donepezil  (ARICEPT ) 10 MG tablet Take 1 tablet (10 mg total) by mouth at bedtime. 01/19/24  Yes Towanda Fret, MD  estradiol  (ESTRACE ) 0.1 MG/GM vaginal cream Discard plastic applicator. Insert a blueberry size amount (approximately 1 gram) of cream on fingertip inside vagina at bedtime every night for 1 week then every other night. For long term use. 03/31/24  Yes Lauretta Ponto, FNP  fexofenadine  (ALLEGRA  ALLERGY) 180 MG tablet Take 1 tablet (180 mg total) by mouth every morning. 04/21/23  Yes Kandice Orleans, MD  fluticasone  (FLONASE ) 50 MCG/ACT nasal spray Place 1 spray into both nostrils daily. 04/21/23  Yes Kandice Orleans, MD  Multiple Vitamin (MULTIVITAMIN PO) Take by mouth.   Yes [provider]  OVER THE COUNTER MEDICATION Multi Collagen Protein - Patient states that she takes 1 scoop a day.   Yes [provider]  OVER THE COUNTER MEDICATION One daily hylands leg cramp relief   Yes [provider]  OVER THE COUNTER MEDICATION Omega xl joint and muscle support daily   Yes [provider]  prednisoLONE acetate (PRED FORTE) 1 % ophthalmic suspension SMARTSIG:In Eye(s) 08/21/23  Yes [provider]  ramipril  (ALTACE ) 10 MG capsule Take one capsule by mouth once daily for blood pressure 03/25/24  Yes Towanda Fret, MD    Blood pressure 126/74, pulse 70, weight 125 lb (56.7 kg), SpO2 96%. Exam: General:  Communicates without difficulty, well nourished, no acute distress. Head: Normocephalic, no evidence injury, no tenderness, facial buttresses intact without stepoff. Face/sinus: No tenderness to palpation and percussion. Facial movement is normal and symmetric. Eyes: Artificial right eye.  No scleral icterus, conjunctivae clear. Neuro: CN II exam reveals left eye vision grossly intact.  No nystagmus at any point of gaze. Ears: Auricles well formed without lesions.  Ear canals are intact without mass or lesion.  No erythema or edema is appreciated.  The TMs are intact without fluid. Nose: External evaluation reveals normal support and skin without lesions.  Dorsum is intact.  Anterior rhinoscopy reveals congested mucosa over anterior aspect of inferior turbinates and intact septum.  No purulence noted. Oral:  Oral cavity and oropharynx are intact, symmetric, without erythema or edema.  Mucosa is moist without lesions. Neck: Full range of motion without pain.  There is no significant lymphadenopathy.  No masses palpable.  Thyroid  bed within normal limits to palpation.  Parotid glands and submandibular glands equal bilaterally without mass.  Trachea is midline. Neuro:  CN 2-12 grossly intact.  Her hearing test shows bilateral progressive high-frequency sensorineural hearing loss.  Assessment: 1.  Bilateral symmetric high-frequency sensorineural hearing loss, likely secondary to presbycusis. 2.  Her tinnitus is likely a direct result of the hearing loss. 3.  Her ear canals, tympanic membranes, and middle ear spaces are normal.  Plan: 1.  The physical exam findings and hearing test results are reviewed with the patient. 2.  The strategies to cope with tinnitus, including the use of masker, hearing aids, tinnitus retraining therapy, and avoidance of caffeine and alcohol are discussed. 3.  The patient is a candidate for hearing amplification.  The hearing aid options are discussed. 4.  The patient will return  for reevaluation in 1 year.  Debbera Wolken W Rowan Pollman 06/08/2024, 11:45 AM

## 2024-06-09 ENCOUNTER — Encounter: Payer: Self-pay | Admitting: Family Medicine

## 2024-06-09 ENCOUNTER — Ambulatory Visit (INDEPENDENT_AMBULATORY_CARE_PROVIDER_SITE_OTHER): Payer: PPO | Admitting: Family Medicine

## 2024-06-09 VITALS — BP 129/79 | HR 76 | Resp 16 | Ht 65.0 in | Wt 125.0 lb

## 2024-06-09 DIAGNOSIS — E782 Mixed hyperlipidemia: Secondary | ICD-10-CM | POA: Diagnosis not present

## 2024-06-09 DIAGNOSIS — R252 Cramp and spasm: Secondary | ICD-10-CM | POA: Diagnosis not present

## 2024-06-09 DIAGNOSIS — N898 Other specified noninflammatory disorders of vagina: Secondary | ICD-10-CM | POA: Diagnosis not present

## 2024-06-09 DIAGNOSIS — I10 Essential (primary) hypertension: Secondary | ICD-10-CM | POA: Diagnosis not present

## 2024-06-09 DIAGNOSIS — G3184 Mild cognitive impairment, so stated: Secondary | ICD-10-CM

## 2024-06-09 DIAGNOSIS — R143 Flatulence: Secondary | ICD-10-CM | POA: Diagnosis not present

## 2024-06-09 MED ORDER — DONEPEZIL HCL 10 MG PO TABS: 10.0000 mg | ORAL_TABLET | Freq: Every day | ORAL | 3 refills | Status: AC

## 2024-06-09 NOTE — Assessment & Plan Note (Signed)
 Continue aricept 10mg  daily.

## 2024-06-09 NOTE — Assessment & Plan Note (Signed)
 Good response to estrogen cream and clobetasol  topically

## 2024-06-09 NOTE — Assessment & Plan Note (Signed)
 Hyperlipidemia:Low fat diet discussed and encouraged.   Lipid Panel  Lab Results  Component Value Date   CHOL 303 (H) 12/30/2023   HDL 112 12/30/2023   LDLCALC 174 (H) 12/30/2023   TRIG 103 12/30/2023   CHOLHDL 2.7 12/30/2023     Intolerant of meds and very high HDL

## 2024-06-09 NOTE — Assessment & Plan Note (Signed)
 Controlled, no change in medication DASH diet and commitment to daily physical activity for a minimum of 30 minutes discussed and encouraged, as a part of hypertension management. The importance of attaining a healthy weight is also discussed.     06/09/2024    8:02 AM 06/07/2024    1:58 PM 04/13/2024    9:34 AM 04/13/2024    9:02 AM 04/05/2024    8:23 AM 02/11/2024    8:07 AM 02/10/2024   11:30 AM  BP/Weight  Systolic BP 129 126 138 148 131 121 122  Diastolic BP 79 74 76 84 73 72 62  Wt. (Lbs) 125.04 125  124  123 122.4  BMI 20.81 kg/m2 20.8 kg/m2  20.63 kg/m2  20.47 kg/m2 20.37 kg/m2

## 2024-06-09 NOTE — Progress Notes (Signed)
   Kelsey Nelson     MRN: [id number]      DOB: 25-Dec-1933  Chief Complaint  Patient presents with   Medical Management of Chronic Issues    Follow up     HPI Kelsey Nelson is here for follow up and re-evaluation of chronic medical conditions, medication management and review of any available recent lab and radiology data.  Preventive health is updated, specifically  Cancer screening and Immunization.   Questions or concerns regarding consultations or procedures which the PT has had in the interim are  addressed. The PT denies any adverse reactions to current medications since the last visit.  Increased muscle spasms amd cramps in last 3 monhts Worried and concerned due to ailing health of spouse  ROS Denies recent fever or chills. Denies sinus pressure, nasal congestion, ear pain or sore throat. Denies chest congestion, productive cough or wheezing. Denies chest pains, palpitations and leg swelling Denies abdominal pain, nausea, vomiting,diarrhea or constipation.   Denies dysuria, frequency, hesitancy or incontinence. Denies joint pain, swelling and limitation in mobility. Denies headaches, seizures, numbness, or tingling. Denies depression, anxiety , chronic  insomnia. Denies skin break down or rash.   PE  BP 129/79   Pulse 76   Resp 16   Ht 5' 5 (1.651 m)   Wt 125 lb 0.6 oz (56.7 kg)   SpO2 94%   BMI 20.81 kg/m   Patient alert and oriented and in no cardiopulmonary distress.  HEENT: No facial asymmetry, EOMI,     Neck supple .  Chest: Clear to auscultation bilaterally.  CVS: S1, S2 no murmurs, no S3.Regular rate.  ABD: Soft non tender.   Ext: No edema  MS: Adequate ROM spine, shoulders, hips and knees.  Skin: Intact, no ulcerations or rash noted.  Psych: Good eye contact, normal affect. Memory intact not anxious or depressed appearing.  CNS: CN 2-12 intact, power,  normal throughout.no focal deficits noted.   Assessment & Plan  HTN  (hypertension) Controlled, no change in medication DASH diet and commitment to daily physical activity for a minimum of 30 minutes discussed and encouraged, as a part of hypertension management. The importance of attaining a healthy weight is also discussed.     06/09/2024    8:02 AM 06/07/2024    1:58 PM 04/13/2024    9:34 AM 04/13/2024    9:02 AM 04/05/2024    8:23 AM 02/11/2024    8:07 AM 02/10/2024   11:30 AM  BP/Weight  Systolic BP 129 126 138 148 131 121 122  Diastolic BP 79 74 76 84 73 72 62  Wt. (Lbs) 125.04 125  124  123 122.4  BMI 20.81 kg/m2 20.8 kg/m2  20.63 kg/m2  20.47 kg/m2 20.37 kg/m2       Muscle cramps Check potassium and magnesium , mustard, pickle juice and tonic water  suggested  Mixed hyperlipidemia Hyperlipidemia:Low fat diet discussed and encouraged.   Lipid Panel  Lab Results  Component Value Date   CHOL 303 (H) 12/30/2023   HDL 112 12/30/2023   LDLCALC 174 (H) 12/30/2023   TRIG 103 12/30/2023   CHOLHDL 2.7 12/30/2023     Intolerant of meds and very high HDL  MCI (mild cognitive impairment) Continue aricept  10 mg daily  Flatulence Gas X / Beano as needed, in public places  Vaginal itching Good response to estrogen cream and clobetasol  topically

## 2024-06-09 NOTE — Assessment & Plan Note (Signed)
 Check potassium and magnesium , mustard, pickle juice and tonic water  suggested

## 2024-06-09 NOTE — Assessment & Plan Note (Signed)
 Gas X / Beano as needed, in public places

## 2024-06-09 NOTE — Patient Instructions (Signed)
 F/U in 4.5 months, call if you need me sooner  Labs today for cramps  It is important that you exercise regularly at least 30 minutes 5 times a week. If you develop chest pain, have severe difficulty breathing, or feel very tired, stop exercising immediately and seek medical attention  Think about what you will eat, plan ahead. Choose  clean, green, fresh or frozen over canned, processed or packaged foods which are more sugary, salty and fatty. 70 to 75% of food eaten should be vegetables and fruit. Three meals at set times with snacks allowed between meals, but they must be fruit or vegetables. Aim to eat over a 12 hour period , example 7 am to 7 pm, and STOP after  your last meal of the day. Drink water ,generally about 64 ounces per day, no other drink is as healthy. Fruit juice is best enjoyed in a healthy way, by EATING the fruit.   Thanks for choosing [facility], we consider it a privelige to serve you.

## 2024-06-10 ENCOUNTER — Ambulatory Visit: Payer: Self-pay | Admitting: Family Medicine

## 2024-06-10 LAB — BASIC METABOLIC PANEL WITH GFR
BUN/Creatinine Ratio: 18 (ref 12–28)
BUN: 16 mg/dL (ref 8–27)
CO2: 23 mmol/L (ref 20–29)
Calcium: 10 mg/dL (ref 8.7–10.3)
Chloride: 99 mmol/L (ref 96–106)
Creatinine, Ser: 0.91 mg/dL (ref 0.57–1.00)
Glucose: 83 mg/dL (ref 70–99)
Potassium: 4.7 mmol/L (ref 3.5–5.2)
Sodium: 139 mmol/L (ref 134–144)
eGFR: 61 mL/min/{1.73_m2} (ref >59)

## 2024-06-10 LAB — MAGNESIUM: Magnesium: 1.9 mg/dL (ref 1.6–2.3)

## 2024-06-11 ENCOUNTER — Encounter: Payer: Self-pay | Admitting: Audiology

## 2024-06-11 ENCOUNTER — Other Ambulatory Visit: Payer: Self-pay | Admitting: Family Medicine

## 2024-06-15 ENCOUNTER — Ambulatory Visit: Admitting: Adult Health

## 2024-06-18 ENCOUNTER — Encounter: Payer: Self-pay | Admitting: Adult Health

## 2024-06-18 ENCOUNTER — Ambulatory Visit: Admitting: Adult Health

## 2024-06-18 VITALS — BP 133/82 | HR 65 | Ht 65.0 in | Wt 125.5 lb

## 2024-06-18 DIAGNOSIS — N8110 Cystocele, unspecified: Secondary | ICD-10-CM

## 2024-06-18 DIAGNOSIS — Z4689 Encounter for fitting and adjustment of other specified devices: Secondary | ICD-10-CM | POA: Diagnosis not present

## 2024-06-18 DIAGNOSIS — N819 Female genital prolapse, unspecified: Secondary | ICD-10-CM | POA: Diagnosis not present

## 2024-06-18 NOTE — Progress Notes (Signed)
  Subjective:     Patient ID: Kelsey Nelson, female   DOB: Jul 18, 1934, 89 y.o.   MRN: [id number]  HPI Kelsey Nelson is an 89 year old white female, married,sp hysterectomy in for pessary maintenance. She is burping a lot and has flatulence and leg cramps. She has seen PCP and has GI appt 06/24/24. She is active, walks 3 miles.   PCP is Dr Antonetta  Review of Systems For pessary maintenance Denies any bleeding or discharge  Has flatulence and burps(to see GI 06/24/24) +leg cramps(pickle juice helps, try warm towel at night, magnesium  level was normal) Reviewed past medical,surgical, social and family history. Reviewed medications and allergies.     Objective:   Physical Exam BP 133/82 (BP Location: Left Arm, Patient Position: Sitting, Cuff Size: Normal)   Pulse 65   Ht 5' 5 (1.651 m)   Wt 125 lb 8 oz (56.9 kg)   BMI 20.88 kg/m     kin warm and dry.Pelvic: external genitalia: thin tissue at introitus, vagina: #2 ring with support removed, and washed with soap and water  and dried, +cystocele and prolapse,no irritation noted, pessary reinserted, urethra has no lesions or masses noted, cervix and uterus are absent, adnexa: no masses or tenderness noted. Bladder is non tender and no masses felt   Upstream - 06/18/24 1127       Pregnancy Intention Screening   Does the patient want to become pregnant in the next year? N/A    Does the patient's partner want to become pregnant in the next year? N/A    Would the patient like to discuss contraceptive options today? N/A      Contraception Wrap Up   Current Method Female Sterilization   hyst   End Method Female Sterilization   hyst   Contraception Counseling Provided No         Examination chaperoned by Clarita Salt LPN  Assessment:     1. Pessary maintenance (Primary) Cleaned and reinserted   2. Cystocele, unspecified   3. Vaginal vault prolapse      Plan:     Follow up in 3 months for pessary maintenance

## 2024-06-24 ENCOUNTER — Ambulatory Visit (INDEPENDENT_AMBULATORY_CARE_PROVIDER_SITE_OTHER): Payer: PPO | Admitting: Gastroenterology

## 2024-06-24 ENCOUNTER — Encounter (INDEPENDENT_AMBULATORY_CARE_PROVIDER_SITE_OTHER): Payer: Self-pay | Admitting: Gastroenterology

## 2024-06-24 VITALS — BP 114/66 | HR 70 | Temp 97.9°F | Ht 65.5 in | Wt 124.8 lb

## 2024-06-24 DIAGNOSIS — K638219 Small intestinal bacterial overgrowth, unspecified: Secondary | ICD-10-CM

## 2024-06-24 DIAGNOSIS — Z860101 Personal history of adenomatous and serrated colon polyps: Secondary | ICD-10-CM

## 2024-06-24 DIAGNOSIS — R142 Eructation: Secondary | ICD-10-CM

## 2024-06-24 DIAGNOSIS — R143 Flatulence: Secondary | ICD-10-CM

## 2024-06-24 DIAGNOSIS — D126 Benign neoplasm of colon, unspecified: Secondary | ICD-10-CM

## 2024-06-24 MED ORDER — RIFAXIMIN 550 MG PO TABS: 550.0000 mg | ORAL_TABLET | Freq: Three times a day (TID) | ORAL | 0 refills | Status: DC

## 2024-06-24 NOTE — Progress Notes (Addendum)
 Referring Provider: [name], MD Primary Care Physician:  [name], MD Primary GI Physician: Dr. Eartha   Chief Complaint  Patient presents with   Follow-up    Pt arrives for follow up. Pt states she has constant gas (burping and flatulence). Pt states it is embarrassing esp to daughter. States the gas has worsened since last visit. Pt also has hand, leg and feet cramps that have worsened.   HPI:   Kelsey Nelson is a 89 y.o. female with past medical history of GERD, HTN, HLD  Patient presenting today for:  Follow up of SIBO Need for repeat colonoscopy due to polyps   Last seen February 2025, at that time having belching, flatus, tried phazyme, gasx without improvement.   SIBO testing done in march showed mild elevation of hydrogen, patient was sent 2 week xifaxan  course  Present: Patient unclear if she took xifaxan  or not. She notes that symptoms are the same with belching and gas. She feels no different. She denies abdominal pain. She states she cannot really well and this affects her appetite. No rectal bleeding or melena. She takes a stool softener which keeps her regular. She denies any GERD symptoms, states she ran out of her omeprazole  but did not see any difference when she was taking it vs. Now.   Patient also reports diffuse muscle cramping, she is taking magnesium  and otc leg cramp medication. Of note her potassium and magnesium  recently checked were WNL.   EGD: 12/26/2023 2 cm hiatal hernia, normal stomach, duodenal lipoma, no specimens collected, recommended to proceed with SIBO breath testing and start Pepcid  Last Colonoscopy: 07/08/2023 - Hemorrhoids found on perianal exam. - One 15 to 18 mm polyp in the ascending colon, removed piecemeal using a cold snare. Resected and retrieved via EMR. Clips were placed. Clip manufacturer: [employer]. Tattooed. - One 1 mm polyp in the ascending colon, removed with a cold biopsy forceps. Resected and  retrieved. - Eight 2 to 8 mm polyps in the transverse colon and in the ascending colon, removed with a cold snare. Resected and retrieved. - Diverticulosis in the sigmoid colon and in the descending colon. - External and internal hemorrhoids.   Pathology showed all polyps were tubular adenomas (total of 10 polyps, one measuring 18 mm)   Recommended repeat colonoscopy in 9 months  Past Medical History:  Diagnosis Date   Acute cystitis with hematuria 10/26/2013   Allergic rhinitis 11/08/2017   Blindness of right eye    BMI between 19-24,adult 2010 145 LBS   Diverticula of small intestine Dec 2010   Diverticulosis    [place]   DIVERTICULOSIS, SIGMOID COLON 03/31/2008   Qualifier: Diagnosis of  By: Tita Lipps  Noted on abdominal and pelvic scan in 04/2011.  Extensive colonic diverticulosis , and possible muscular hypertrophy at rectosigmoid junction    Family hx of colon cancer 11/09/2013   Functional constipation 2010   GERD (gastroesophageal reflux disease)    HTN (hypertension)    Hyperlipemia    Pancreatic cyst 02/05/2013   Renal cyst 05/27/2011   Right eye trauma 1949   Loss of vision resulted at age 52   Schatzki's ring Dec 2010   Skin lesions, generalized 06/19/2015   Tubular adenoma 11/09/2013    Past Surgical History:  Procedure Laterality Date   ABDOMINAL HYSTERECTOMY     BREAST REDUCTION SURGERY  1992 BIL   COLONOSCOPY  DEC 2010   SIMPLE ADENOMAS (6-8), [place] TICS, SML IH  COLONOSCOPY N/A 12/08/2013   Procedure: COLONOSCOPY;  Surgeon: Claudis RAYMOND Rivet, MD;  [facility];  Service: Endoscopy;  Laterality: N/A;  225   COLONOSCOPY WITH PROPOFOL  N/A 07/08/2023   Procedure: COLONOSCOPY WITH PROPOFOL ;  Surgeon: Eartha Angelia Sieving, MD;  Location: [facility];  Service: Gastroenterology;  Laterality: N/A;  9:15am; asa 3   ESOPHAGOGASTRODUODENOSCOPY (EGD) WITH PROPOFOL  N/A 01/13/2024   Procedure: ESOPHAGOGASTRODUODENOSCOPY (EGD) WITH PROPOFOL ;  Surgeon: Eartha Angelia Sieving, MD;  [facility];  Service: Gastroenterology;  Laterality: N/A;  2:30PM;ASA 3   EVISCERATION Right 05/08/2015   Procedure: EVISCERATION WITH IMPLANT RIGHT EYE;  Surgeon: Dow JULIANNA Burke, MD;  Location: [facility];  Service: Ophthalmology;  Laterality: Right;   EYE SURGERY  RIGHT 2005   EYE SURGERY  right 2016   removed and false eye placed in 08/2015   HEMOSTASIS CLIP PLACEMENT  07/08/2023   Procedure: HEMOSTASIS CLIP PLACEMENT;  Surgeon: Eartha Angelia Sieving, MD;  [facility];  Service: Gastroenterology;;   POLYPECTOMY  07/08/2023   Procedure: POLYPECTOMY;  Surgeon: Eartha Angelia Sieving, MD;  [facility];  Service: Gastroenterology;;   SUBMUCOSAL LIFTING INJECTION  07/08/2023   Procedure: SUBMUCOSAL LIFTING INJECTION;  Surgeon: Eartha Angelia Sieving, MD;  [facility];  Service: Gastroenterology;;   SUBMUCOSAL TATTOO INJECTION  07/08/2023   Procedure: SUBMUCOSAL TATTOO INJECTION;  Surgeon: Eartha Angelia, Sieving, MD;  [facility];  Service: Gastroenterology;;   TOTAL ABDOMINAL HYSTERECTOMY W/ BILATERAL SALPINGOOPHORECTOMY  1985 FIBROIDS   UPPER GASTROINTESTINAL ENDOSCOPY  DEC 2010   Bx-MILD GASTRITIS/DUODENITIS, DUODENAL DIVERTICULA, SCHATZKI'S RING    Current Outpatient Medications  Medication Sig Dispense Refill   Cholecalciferol (VITAMIN D3 PO) Take 25 mcg by mouth daily.     clobetasol  ointment (TEMOVATE ) 0.05 % Use bid x 2 weeks then 2 x weekly on external tissue of vulva 30 g 1   conjugated estrogens  (PREMARIN ) vaginal cream Place 1 applicator vaginally. Every other day     diphenhydrAMINE  (BENADRYL  ALLERGY) 25 mg capsule Takes one at bedtime then 3 to 4 hours later takes a 2nd one     donepezil  (ARICEPT ) 10 MG tablet Take 1 tablet (10 mg total) by mouth at bedtime. 90 tablet 3   estradiol  (ESTRACE ) 0.1 MG/GM vaginal cream Discard plastic applicator. Insert a blueberry size amount (approximately 1  gram) of cream on fingertip inside vagina at bedtime every night for 1 week then every other night. For long term use. 30 g 3   fexofenadine  (ALLEGRA  ALLERGY) 180 MG tablet Take 1 tablet (180 mg total) by mouth every morning. 30 tablet 5   fluticasone  (FLONASE ) 50 MCG/ACT nasal spray Place 1 spray into both nostrils daily. 16 g 5   MAGNESIUM  PO Take by mouth.     Multiple Vitamin (MULTIVITAMIN PO) Take by mouth.     OVER THE COUNTER MEDICATION Multi Collagen Protein - Patient states that she takes 1 scoop a day.     OVER THE COUNTER MEDICATION One daily hylands leg cramp relief     OVER THE COUNTER MEDICATION Omega xl joint and muscle support daily     prednisoLONE acetate (PRED FORTE) 1 % ophthalmic suspension SMARTSIG:In Eye(s)     ramipril  (ALTACE ) 10 MG capsule Take one capsule by mouth once daily for blood pressure 90 capsule 3   Current Facility-Administered Medications  Medication Dose Route Frequency Provider Last Rate Last Admin   omalizumab  (XOLAIR ) prefilled syringe 300 mg  300  mg Subcutaneous Q28 days Iva Marty Saltness, MD   300 mg at 09/15/23 9176    Allergies as of 06/24/2024 - Review Complete 06/24/2024  Allergen Reaction Noted   Codeine  07/22/2015   Crestor  [rosuvastatin ] Other (See Comments) 10/26/2021   Fosamax [alendronate sodium]  12/31/2013    Social History   Socioeconomic History   Marital status: Married    Spouse name: [name]    Number of children: 3   Years of education: 12   Highest education level: 12th grade  Occupational History   Occupation: retired   Tobacco Use   Smoking status: Never    Passive exposure: Past   Smokeless tobacco: Never  Vaping Use   Vaping status: Never Used  Substance and Sexual Activity   Alcohol use: No    Alcohol/week: 0.0 standard drinks of alcohol   Drug use: No   Sexual activity: Not Currently    Birth control/protection: Post-menopausal, Surgical    Comment: hysterectomy  Other Topics Concern   Not on file   Social History Narrative   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/   Social Drivers of Corporate investment banker Strain: Low Risk  (12/09/2023)   Overall Financial Resource Strain (CARDIA)    Difficulty of Paying Living Expenses: Not hard at all  Food Insecurity: No Food Insecurity (12/09/2023)   Hunger Vital Sign    Worried About Running Out of Food in the Last Year: Never true    Ran Out of Food in the Last Year: Never true  Transportation Needs: No Transportation Needs (12/09/2023)   PRAPARE - Administrator, Civil Service (Medical): No    Lack of Transportation (Non-Medical): No  Physical Activity: Sufficiently Active (12/09/2023)   Exercise Vital Sign    Days of Exercise per Week: 5 days    Minutes of Exercise per Session: 60 min  Stress: No Stress Concern Present (12/09/2023)   [employer] of Occupational Health - Occupational Stress Questionnaire    Feeling of Stress : Not at all  Social Connections: Socially Integrated (12/09/2023)   Social Connection and Isolation Panel    Frequency of Communication with Friends and Family: Three times a week    Frequency of Social Gatherings with Friends and Family: Twice a week    Attends Religious Services: More than 4 times per year    Active Member of [employer] or Organizations: Yes    Attends Engineer, structural: More than 4 times per year    Marital Status: Married    Review of systems General: negative for malaise, night sweats, fever, chills, weight loss Neck: Negative for lumps, goiter, pain and significant neck swelling Resp: Negative for cough, wheezing, dyspnea at rest CV: Negative for chest pain, leg swelling, palpitations, orthopnea GI: denies melena, hematochezia, nausea, vomiting, diarrhea, constipation, dysphagia, odyonophagia, early satiety or unintentional weight loss. +bloating +gas +belching  MSK: Negative for joint pain or swelling, back pain, +muscle cramping  Derm:  Negative for itching or rash Psych: Denies depression, anxiety, memory loss, confusion. No homicidal or suicidal ideation.  Heme: Negative for prolonged bleeding, bruising easily, and swollen nodes. Endocrine: Negative for cold or heat intolerance, polyuria, polydipsia and goiter. Neuro: negative for tremor, gait imbalance, syncope and seizures. The remainder of the review of systems is noncontributory.  Physical Exam: BP 114/66   Pulse 70   Temp 97.9 F (36.6 C)   Ht 5' 5.5 (1.664 m)   Wt 124 lb 12.8 oz (  56.6 kg)   BMI 20.45 kg/m  General:   Alert and oriented. No distress noted. Pleasant and cooperative.  Head:  Normocephalic and atraumatic. Eyes:  Conjuctiva clear without scleral icterus. Mouth:  Oral mucosa pink and moist. Good dentition. No lesions. Heart: Normal rate and rhythm, s1 and s2 heart sounds present.  Lungs: Clear lung sounds in all lobes. Respirations equal and unlabored. Abdomen:  +BS, soft, non-tender and non-distended. No rebound or guarding. No HSM or masses noted. Derm: No palmar erythema or jaundice Msk:  Symmetrical without gross deformities. Normal posture. Extremities:  Without edema. Neurologic:  Alert and  oriented x4 Psych:  Alert and cooperative. Normal mood and affect.  Invalid input(s): 6 MONTHS   ASSESSMENT: Kelsey Nelson is a 89 y.o. female presenting today for follow up of bloating, belching, gas secondary to SIBO and encounter for repeat Colonoscopy due to adenomatous polyps of the colon  Continued bloating, belching, gas. SIBO testing positive in March. She was sent course of xifaxan , however, it is unclear during visit today if she took this. She cannot recall every picking the medication up and notes no improvement in her symptoms. Upon contacting her pharmacy, they state patient did pick up the medication at the end of march, however, as it is unclear if she took this as she cannot recall, and has had no improvement in symptoms, will send  another course of xifaxan  550mg  TID x14 days.   In regards to her previous colonoscopy in July 2024, she had presence of 10 adenomatous polyps, she was recommended to repeat TCS in 9 months but was lost to follow up. We discussed importance of follow up colonoscopy. She is amenable to getting this scheduled. Indications, risks and benefits of procedure discussed in detail with patient. Patient verbalized understanding and is in agreement to proceed with colonoscopy.  PLAN:  -schedule Colonoscopy ASA III  -xifaxan  550mg  TID x 14 days -discuss cramping with PCP  All questions were answered, patient verbalized understanding and is in agreement with plan as outlined above.   Follow Up: 3 months   Elmo Rio L. Mariette, MSN, APRN, AGNP-C Adult-Gerontology Nurse Practitioner [facility]  I have reviewed the note and agree with the APP's assessment as described in this progress note  Toribio Fortune, MD Gastroenterology and Hepatology [facility]

## 2024-06-24 NOTE — Patient Instructions (Addendum)
 Your breath testing was positive for small bacterial overgrowth in the small intestine We had previously sent xifaxan  to take as this is how we treat this overgrowth, per your pharmacy, it appears you picked this up, however as it is unclear if you took it and are still having symptoms, I will send this again, please take this three times per day for 14 days, this should help with your gas, bloating and belching Please follow up with your PCP regarding muscle cramps, as I do not see any cause among your labs (magnesium , potassium) that would be contributing to this  We will get you scheduled for colonoscopy as well, due to numerous polyps on last one  Follow up 3 months

## 2024-07-13 ENCOUNTER — Ambulatory Visit: Payer: Self-pay

## 2024-07-13 NOTE — Telephone Encounter (Signed)
 FYI Only or Action Required?: FYI only for provider.  Patient was last seen in primary care on 06/09/2024 by [name], MD.  Called Nurse Triage reporting Constipation.  Symptoms began 1.5 week.  Interventions attempted: Other: laxatives, stool softners.  Symptoms are: unchanged.  Triage Disposition: See Physician Within 24 Hours: referred pt to urgent care  Patient/caregiver understands and will follow disposition?: Yes      Copied from CRM [id number]. Topic: Clinical - Red Word Triage >> Jul 13, 2024  3:19 PM Nathanel BROCKS wrote: Red Word that prompted transfer to Nurse Triage:   Pt is having a problem with constipation. Its been at least over a week. Has tried things at home but has not gotten any relief. She is in pain. Reason for Disposition  Last bowel movement (BM) > 4 days ago    Scheduled appt with [facility] for 07/14/2024 @ 1100  Answer Assessment - Initial Assessment Questions 1. STOOL PATTERN OR FREQUENCY: How often do you have a bowel movement (BM)?  (Normal range: 3 times a day to every 3 days)  When was your last BM?       Every day or every 2 days 2. STRAINING: Do you have to strain to have a BM?      yes 3. ONSET: When did the constipation begin?     1.5 week 4. RECTAL PAIN: Does your rectum hurt when the stool comes out? If Yes, ask: Do you have hemorrhoids? How bad is the pain?  (Scale 1-10; or mild, moderate, severe)     Moderate to severe 5. BM COMPOSITION: Are the stools hard?      Hard small stools 6. BLOOD ON STOOLS: Has there been any blood on the toilet tissue or on the surface of the BM? If Yes, ask: When was the last time?     no 7. CHRONIC CONSTIPATION: Is this a new problem for you?  If No, ask: How long have you had this problem? (days, weeks, months)      no 8. CHANGES IN DIET OR HYDRATION: Have there been any recent changes in your diet? How much fluids are you drinking on a daily basis?  How much  have you had to drink today?     no 9. MEDICINES: Have you been taking any new medicines? Are you taking any narcotic pain medicines? (e.g., Dilaudid, morphine, Percocet, Vicodin)     na 10. LAXATIVES: Have you been using any stool softeners, laxatives, or enemas?  If Yes, ask What are you using, how often, and when was the last time?       Laxatives, stool softeners, duo lax 11. ACTIVITY:  How much walking do you do every day?  Has your activity level decreased in the past week?        no 12. CAUSE: What do you think is causing the constipation?        unknown 13. MEDICAL HISTORY: Do you have a history of hemorrhoids, rectal fissures, rectal surgery, or rectal abscess?         na 14. OTHER SYMPTOMS: Do you have any other symptoms? (e.g., abdomen pain, bloating, fever, vomiting)       Rectal pain,  15. PREGNANCY: Is there any chance you are pregnant? When was your last menstrual period?       Na  No appointments available in office- scheduled patient an appt at urgent care: pt verbalized understanding.  Protocols used: Constipation-A-AH

## 2024-07-14 ENCOUNTER — Ambulatory Visit
Admission: RE | Admit: 2024-07-14 | Discharge: 2024-07-14 | Disposition: A | Discharge status: Home / Self Care | Payer: Self-pay | Source: Ambulatory Visit | Attending: Nurse Practitioner | Admitting: Nurse Practitioner

## 2024-07-14 ENCOUNTER — Other Ambulatory Visit: Payer: Self-pay

## 2024-07-14 VITALS — BP 126/75 | HR 68 | Temp 98.4°F | Resp 20

## 2024-07-14 DIAGNOSIS — K5900 Constipation, unspecified: Secondary | ICD-10-CM

## 2024-07-14 DIAGNOSIS — Z8719 Personal history of other diseases of the digestive system: Secondary | ICD-10-CM

## 2024-07-14 MED ORDER — POLYETHYLENE GLYCOL 3350 17 G PO PACK
17.0000 g | PACK | Freq: Every day | ORAL | 0 refills | Status: AC
Start: 2024-07-14 — End: ?

## 2024-07-14 MED ORDER — SENNOSIDES-DOCUSATE SODIUM 8.6-50 MG PO TABS: 2.0000 | ORAL_TABLET | Freq: Every evening | ORAL | 0 refills | Status: AC | PRN

## 2024-07-14 NOTE — Discharge Instructions (Signed)
 You have been prescribed MiraLAX  to help with constipation.  Take the MiraLAX  in the morning and at night until you have had a long sausagelike stool.  Once you have passed the stool, you can begin taking 1 package daily as needed for constipation. You may take over-the-counter Tylenol  as needed for pain or discomfort. Make sure you are drinking at least 5-7 8 ounce glasses of water  daily. Recommend a diet that is high in fiber to help with constipation. Make sure you are staying active to help increase bowel motility Go to the emergency department if you experience nausea, vomiting, gas, bloating, abdominal pain, or worsening constipation. Please follow-up with gastroenterology for reevaluation within the next 7 to 10 days.  Also recommend that you follow-up to find out the status of the colonoscopy. Follow-up as needed.

## 2024-07-14 NOTE — ED Provider Notes (Signed)
 [facility]    CSN: [id number] Arrival date & time: 07/14/24  1050      History   Chief Complaint Chief Complaint  Patient presents with   Constipation    Entered by patient    HPI Kelsey Nelson is a 89 y.o. female.   The history is provided by the patient.   Patient presents for complaints of constipation.  Patient states she has not had a good bowel movement over the past week.  States that she did have 1 bowel movement that was small with hard stool, states that she had another bowel movement that was more liquid.  She states that she has taken Dulcolax for her symptoms and an over-the-counter stool softener.  Patient states that she does not have any fever, chills, nausea, vomiting, or abdominal pain.  She does endorse abdominal cramping and gas.  Per review of her chart, this has been ongoing.  States that she does drink plenty of water  and eats plenty of fruits and vegetables.  Past medical history includes GERD, diverticulosis, and SIBO.  Patient reports that she does have underlying history of constipation as well.  Patient does see gastroenterology.  Patient states that she has called and requested to see Dr. Eartha, but states that she was referred back to [facility], states that she did not receive much help after seeing Chelsea.  Patient states that the office was also supposed to schedule a colonoscopy, but she has not heard anything back for the past 2 weeks.    Past Medical History:  Diagnosis Date   Acute cystitis with hematuria 10/26/2013   Allergic rhinitis 11/08/2017   Blindness of right eye    BMI between 19-24,adult 2010 145 LBS   Diverticula of small intestine Dec 2010   Diverticulosis    [place]   DIVERTICULOSIS, SIGMOID COLON 03/31/2008   Qualifier: Diagnosis of  By: Tita Lipps  Noted on abdominal and pelvic scan in 04/2011.  Extensive colonic diverticulosis , and possible muscular hypertrophy at rectosigmoid junction    Family hx of colon cancer  11/09/2013   Functional constipation 2010   GERD (gastroesophageal reflux disease)    HTN (hypertension)    Hyperlipemia    Pancreatic cyst 02/05/2013   Renal cyst 05/27/2011   Right eye trauma 1949   Loss of vision resulted at age 43   Schatzki's ring Dec 2010   Skin lesions, generalized 06/19/2015   Tubular adenoma 11/09/2013    Patient Active Problem List   Diagnosis Date Noted   Small intestinal bacterial overgrowth (SIBO) 06/24/2024   Leg cramps 06/09/2024   Sensorineural hearing loss, bilateral 06/08/2024   Tinnitus of both ears 06/08/2024   Hot flashes 04/13/2024   Encounter for Medicare annual examination with abnormal findings 02/15/2024   Pigmented skin lesions 02/11/2024   Vaginal discomfort 01/22/2024   Oral pain 01/19/2024   Colon polyps 01/19/2024   Flatulence 12/26/2023   Lichen sclerosus et atrophicus 12/09/2023   Vulvar itching 12/09/2023   Vaginal itching 12/09/2023   S/P hysterectomy with oophorectomy 12/09/2023   Cystocele, unspecified 12/09/2023   Vaginal vault prolapse 12/09/2023   Pessary maintenance 12/09/2023   Muscle cramps 10/31/2023   Complication of prosthetic eye 08/15/2023   Chronic urticaria 04/24/2023   MCI (mild cognitive impairment) 03/10/2023   Xerostomia 12/13/2022   CKD (chronic kidney disease) stage 3, GFR 30-59 ml/min (HCC) 09/23/2022   Allergic dermatitis 07/19/2022   Food allergy 07/19/2022   Skin lesions 01/22/2022   Abdominal  spasms 01/22/2022   Recurrent UTI 05/10/2021   Skipped heart beats 10/19/2020   Anterior knee pain, right 10/05/2019   Burping 10/05/2019   Ectropion of right eye 11/22/2015   Acquired superior sulcus deformity of orbit 07/04/2015   History of evisceration of eye 07/04/2015   Nuclear sclerosis of left eye 07/04/2015   Urinary urgency 10/31/2013   Loss of taste 07/31/2012   Pancreatic cyst 04/21/2012   H/O gastroesophageal reflux (GERD) 04/21/2012   Nocturia 12/02/2011   Insomnia 09/20/2011   Nausea  06/22/2011   Change in bowel habits 06/21/2011   GERD 11/20/2009   Constipation 11/20/2009   Mixed hyperlipidemia 03/31/2008   BLINDNESS, RIGHT EYE 03/31/2008   HTN (hypertension) 03/31/2008   Osteoporosis 03/31/2008    Past Surgical History:  Procedure Laterality Date   ABDOMINAL HYSTERECTOMY     BREAST REDUCTION SURGERY  1992 BIL   COLONOSCOPY  DEC 2010   SIMPLE ADENOMAS (6-8), [place] TICS, SML IH   COLONOSCOPY N/A 12/08/2013   Procedure: COLONOSCOPY;  Surgeon: Claudis RAYMOND Rivet, MD;  [facility]: [facility];  Service: Endoscopy;  Laterality: N/A;  225   COLONOSCOPY WITH PROPOFOL  N/A 07/08/2023   Procedure: COLONOSCOPY WITH PROPOFOL ;  Surgeon: Eartha Angelia Sieving, MD;  Location: [facility];  Service: Gastroenterology;  Laterality: N/A;  9:15am; asa 3   ESOPHAGOGASTRODUODENOSCOPY (EGD) WITH PROPOFOL  N/A 01/13/2024   Procedure: ESOPHAGOGASTRODUODENOSCOPY (EGD) WITH PROPOFOL ;  Surgeon: Eartha Angelia Sieving, MD;  [facility]: [facility];  Service: Gastroenterology;  Laterality: N/A;  2:30PM;ASA 3   EVISCERATION Right 05/08/2015   Procedure: EVISCERATION WITH IMPLANT RIGHT EYE;  Surgeon: Dow JULIANNA Burke, MD;  Location: [facility];  Service: Ophthalmology;  Laterality: Right;   EYE SURGERY  RIGHT 2005   EYE SURGERY  right 2016   removed and false eye placed in 08/2015   HEMOSTASIS CLIP PLACEMENT  07/08/2023   Procedure: HEMOSTASIS CLIP PLACEMENT;  Surgeon: Eartha Angelia Sieving, MD;  [facility];  Service: Gastroenterology;;   POLYPECTOMY  07/08/2023   Procedure: POLYPECTOMY;  Surgeon: Eartha Angelia Sieving, MD;  [facility];  Service: Gastroenterology;;   SUBMUCOSAL LIFTING INJECTION  07/08/2023   Procedure: SUBMUCOSAL LIFTING INJECTION;  Surgeon: Eartha Angelia Sieving, MD;  [facility];  Service: Gastroenterology;;   SUBMUCOSAL TATTOO INJECTION  07/08/2023   Procedure: SUBMUCOSAL TATTOO INJECTION;  Surgeon: Eartha Angelia Sieving, MD;   [facility];  Service: Gastroenterology;;   TOTAL ABDOMINAL HYSTERECTOMY W/ BILATERAL SALPINGOOPHORECTOMY  1985 FIBROIDS   UPPER GASTROINTESTINAL ENDOSCOPY  DEC 2010   Bx-MILD GASTRITIS/DUODENITIS, DUODENAL DIVERTICULA, SCHATZKI'S RING    OB History     Gravida  3   Para  3   Term  3   Preterm      AB      Living  3      SAB      IAB      Ectopic      Multiple      Live Births  3            Home Medications    Prior to Admission medications   Medication Sig Start Date End Date Taking? Authorizing Provider  Cholecalciferol (VITAMIN D3 PO) Take 25 mcg by mouth daily.    [provider]  clobetasol  ointment (TEMOVATE ) 0.05 % Use bid x 2 weeks then 2 x weekly on external tissue of vulva 12/09/23   Signa Nest A, NP  conjugated estrogens  (PREMARIN ) vaginal cream  Place 1 applicator vaginally. Every other day    [provider]  diphenhydrAMINE  (BENADRYL  ALLERGY) 25 mg capsule Takes one at bedtime then 3 to 4 hours later takes a 2nd one 02/11/24   [name], MD  donepezil  (ARICEPT ) 10 MG tablet Take 1 tablet (10 mg total) by mouth at bedtime. 06/09/24   [name], MD  estradiol  (ESTRACE ) 0.1 MG/GM vaginal cream Discard plastic applicator. Insert a blueberry size amount (approximately 1 gram) of cream on fingertip inside vagina at bedtime every night for 1 week then every other night. For long term use. 03/31/24   Gerldine Lauraine BROCKS, FNP  fexofenadine  (ALLEGRA  ALLERGY) 180 MG tablet Take 1 tablet (180 mg total) by mouth every morning. 04/21/23   Tobie Arleta SQUIBB, MD  fluticasone  (FLONASE ) 50 MCG/ACT nasal spray Place 1 spray into both nostrils daily. 04/21/23   Tobie Arleta SQUIBB, MD  MAGNESIUM  PO Take by mouth.    [provider]  Multiple Vitamin (MULTIVITAMIN PO) Take by mouth.    [provider]  OVER THE COUNTER MEDICATION Multi Collagen Protein - Patient states that she takes 1 scoop a day.    [provider]  OVER THE COUNTER MEDICATION One daily hylands leg cramp relief    [provider]  OVER THE COUNTER MEDICATION Omega xl joint and muscle support daily    [provider]  prednisoLONE acetate (PRED FORTE) 1 % ophthalmic suspension SMARTSIG:In Eye(s) 08/21/23   [provider]  ramipril  (ALTACE ) 10 MG capsule Take one capsule by mouth once daily for blood pressure 03/25/24   [name], MD  rifaximin  (XIFAXAN ) 550 MG TABS tablet Take 1 tablet (550 mg total) by mouth 3 (three) times daily. 06/24/24   Mariette Mitzie CROME, NP    Family History Family History  Problem Relation Age of Onset   Pancreatic cancer Sister    Colon cancer Paternal Aunt    Colon polyps Neg Hx     Social History Social History   Tobacco Use   Smoking status: Never    Passive exposure: Past   Smokeless tobacco: Never  Vaping Use   Vaping status: Never Used  Substance Use Topics   Alcohol use: No    Alcohol/week: 0.0 standard drinks of alcohol   Drug use: No     Allergies   Codeine, Crestor  [rosuvastatin ], and Fosamax [alendronate sodium]   Review of Systems Review of Systems Per HPI  Physical Exam Triage Vital Signs ED Triage Vitals  Encounter Vitals Group     BP 07/14/24 1104 126/75     Girls Systolic BP Percentile --      Girls Diastolic BP Percentile --      Boys Systolic BP Percentile --      Boys Diastolic BP Percentile --      Pulse Rate 07/14/24 1104 68     Resp 07/14/24 1104 20     Temp 07/14/24 1104 98.4 F (36.9 C)     Temp Source 07/14/24 1104 Oral     SpO2 07/14/24 1104 94 %     Weight --      Height --      Head Circumference --      Peak Flow --      Pain Score 07/14/24 1103 0     Pain Loc --      Pain Education --      Exclude from Growth Chart --    No data found.  Updated Vital Signs BP 126/75 (BP Location: Right Arm)   Pulse 68   Temp 98.4 F (36.9 C) (Oral)   Resp 20   SpO2 94%   Visual Acuity Right Eye  Distance:   Left Eye Distance:   Bilateral Distance:    Right Eye Near:   Left Eye Near:    Bilateral Near:     Physical Exam Vitals and nursing note reviewed.  Constitutional:      General: She is not in acute distress.    Appearance: Normal appearance.  HENT:     Head: Normocephalic.  Eyes:     Extraocular Movements: Extraocular movements intact.     Conjunctiva/sclera: Conjunctivae normal.     Pupils: Pupils are equal, round, and reactive to light.  Cardiovascular:     Rate and Rhythm: Normal rate and regular rhythm.     Pulses: Normal pulses.     Heart sounds: Normal heart sounds.  Pulmonary:     Effort: Pulmonary effort is normal.     Breath sounds: Normal breath sounds.  Abdominal:     General: Bowel sounds are normal.     Palpations: Abdomen is soft.     Tenderness: There is no abdominal tenderness. There is no guarding or rebound.  Musculoskeletal:     Cervical back: Normal range of motion.  Skin:    General: Skin is warm and dry.  Neurological:     General: No focal deficit present.     Mental Status: She is alert and oriented to person, place, and time.  Psychiatric:        Mood and Affect: Mood normal.        Behavior: Behavior normal.      UC Treatments / Results  Labs (all labs ordered are listed, but only abnormal results are displayed) Labs Reviewed - No data to display  EKG   Radiology No results found.  Procedures Procedures (including critical care time)  Medications Ordered in UC Medications - No data to display  Initial Impression / Assessment and Plan / UC Course  I have reviewed the triage vital signs and the nursing notes.  Pertinent labs & imaging results that were available during my care of the patient were reviewed by me and considered in my medical decision making (see chart for details).  Patient with history of constipation, with worsening constipation over the past week.  Will start patient on MiraLAX  17 g powder along  with Senokot 8.6/50 mg tablets.  Supportive care recommendations were provided and discussed with the patient to include over-the-counter Tylenol  for pain or discomfort, increasing fluids, and ensuring she is eating plenty of fruits and vegetables.  Discussed strict ER follow-up precautions with the patient.  Patient was also advised to follow-up with gastroenterology if symptoms fail to improve, and to follow-up on colonoscopy.  Patient was in agreement with this plan of care and verbalizes understanding.  All questions were answered.  Patient stable for discharge.   Final Clinical Impressions(s) / UC Diagnoses   Final diagnoses:  None   Discharge Instructions   None    ED Prescriptions   None    PDMP not reviewed this encounter.   Gilmer Etta PARAS, NP 07/14/24 1130

## 2024-07-14 NOTE — ED Triage Notes (Addendum)
 Pt reports has not had bowel movement x1 week. Pt reports abdominal cramps, belching. Denies fever, chills.   Has tried laxatives at home.

## 2024-07-14 NOTE — ED Notes (Signed)
 Pt reports voided prior to leaving home. Pt unable to provide urine sample at this time. Water  provided.

## 2024-07-15 ENCOUNTER — Encounter (INDEPENDENT_AMBULATORY_CARE_PROVIDER_SITE_OTHER): Admitting: Gastroenterology

## 2024-07-23 DIAGNOSIS — H903 Sensorineural hearing loss, bilateral: Secondary | ICD-10-CM | POA: Diagnosis not present

## 2024-07-26 ENCOUNTER — Other Ambulatory Visit: Payer: Self-pay | Admitting: *Deleted

## 2024-07-26 ENCOUNTER — Encounter (INDEPENDENT_AMBULATORY_CARE_PROVIDER_SITE_OTHER): Admitting: Gastroenterology

## 2024-07-26 ENCOUNTER — Encounter: Payer: Self-pay | Admitting: *Deleted

## 2024-07-26 MED ORDER — PEG 3350-KCL-NA BICARB-NACL 420 G PO SOLR: 4000.0000 mL | Freq: Once | ORAL | 0 refills | Status: AC

## 2024-08-04 DIAGNOSIS — H40052 Ocular hypertension, left eye: Secondary | ICD-10-CM | POA: Diagnosis not present

## 2024-08-04 DIAGNOSIS — H0102A Squamous blepharitis right eye, upper and lower eyelids: Secondary | ICD-10-CM | POA: Diagnosis not present

## 2024-08-04 DIAGNOSIS — H0102B Squamous blepharitis left eye, upper and lower eyelids: Secondary | ICD-10-CM | POA: Diagnosis not present

## 2024-08-04 DIAGNOSIS — H1045 Other chronic allergic conjunctivitis: Secondary | ICD-10-CM | POA: Diagnosis not present

## 2024-08-09 NOTE — Patient Instructions (Signed)
 Kelsey Nelson  08/09/2024     @PREFPERIOPPHARMACY @   Your procedure is scheduled on  08/13/2024.   [facility] to [name] at  1115  A.M.   Call this number if you have problems the morning of surgery:  [phone]  If you experience any cold or flu symptoms such as cough, fever, chills, shortness of breath, etc. between now and your scheduled surgery, please notify us  at the above number.   Remember:  Follow the diet and prep instructions given to you by the office.   You may drink clear liquids until  0915 am on 08/13/2024.    Clear liquids allowed are:                    Water , Juice (No red color; non-citric and without pulp; diabetics please choose diet or no sugar options), Carbonated beverages (diabetics please choose diet or no sugar options), Clear Tea (No creamer, milk, or cream, including half & half and powdered creamer), Black Coffee Only (No creamer, milk or cream, including half & half and powdered creamer), and Clear Sports drink (No red color; diabetics please choose diet or no sugar options)    Take these medicines the morning of surgery with A SIP OF WATER                                                       None.    Do not wear jewelry, make-up or nail polish, including gel polish,  artificial nails, or any other type of covering on natural nails (fingers and  toes).  Do not wear lotions, powders, or perfumes, or deodorant.  Do not shave 48 hours prior to surgery.  Men may shave face and neck.  Do not bring valuables to the hospital.  [facility] is not responsible for any belongings or valuables.  Contacts, dentures or bridgework may not be worn into surgery.  Leave your suitcase in the car.  After surgery it may be brought to your room.  For patients admitted to the hospital, discharge time will be determined by your treatment team.  Patients discharged the day of surgery will not be allowed to drive home and must have someone with them for 24 hours.     Special instructions:  DO NOT smoke tobacco or vape for 24 hours before your procedure.  Please read over the following fact sheets that you were given. Anesthesia Post-op Instructions and Care and Recovery After Surgery      Colonoscopy, Adult, Care After The following information offers guidance on how to care for yourself after your procedure. Your health care provider may also give you more specific instructions. If you have problems or questions, contact your health care provider. What can I expect after the procedure? After the procedure, it is common to have: A small amount of blood in your stool for 24 hours after the procedure. Some gas. Mild cramping or bloating of your abdomen. Follow these instructions at home: Eating and drinking  Drink enough fluid to keep your urine pale yellow. Follow instructions from your health care provider about eating or drinking restrictions. Resume your normal diet as told by your health care provider. Avoid heavy or fried foods that are hard to digest. Activity Rest as told by your health care  provider. Avoid sitting for a long time without moving. Get up to take short walks every 1-2 hours. This is important to improve blood flow and breathing. Ask for help if you feel weak or unsteady. Return to your normal activities as told by your health care provider. Ask your health care provider what activities are safe for you. Managing cramping and bloating  Try walking around when you have cramps or feel bloated. If directed, apply heat to your abdomen as told by your health care provider. Use the heat source that your health care provider recommends, such as a moist heat pack or a heating pad. Place a towel between your skin and the heat source. Leave the heat on for 20-30 minutes. Remove the heat if your skin turns bright red. This is especially important if you are unable to feel pain, heat, or cold. You have a greater risk of getting  burned. General instructions If you were given a sedative during the procedure, it can affect you for several hours. Do not drive or operate machinery until your health care provider says that it is safe. For the first 24 hours after the procedure: Do not sign important documents. Do not drink alcohol. Do your regular daily activities at a slower pace than normal. Eat soft foods that are easy to digest. Take over-the-counter and prescription medicines only as told by your health care provider. Keep all follow-up visits. This is important. Contact a health care provider if: You have blood in your stool 2-3 days after the procedure. Get help right away if: You have more than a small spotting of blood in your stool. You have large blood clots in your stool. You have swelling of your abdomen. You have nausea or vomiting. You have a fever. You have increasing pain in your abdomen that is not relieved with medicine. These symptoms may be an emergency. Get help right away. Call 911. Do not wait to see if the symptoms will go away. Do not drive yourself to the hospital. Summary After the procedure, it is common to have a small amount of blood in your stool. You may also have mild cramping and bloating of your abdomen. If you were given a sedative during the procedure, it can affect you for several hours. Do not drive or operate machinery until your health care provider says that it is safe. Get help right away if you have a lot of blood in your stool, nausea or vomiting, a fever, or increased pain in your abdomen. This information is not intended to replace advice given to you by your health care provider. Make sure you discuss any questions you have with your health care provider. Document Revised: 01/21/2023 Document Reviewed: 08/01/2021 Elsevier Patient Education  2024 [employer]General Anesthesia, Adult, Care After The following information offers guidance on how to care for yourself  after your procedure. Your health care provider may also give you more specific instructions. If you have problems or questions, contact your health care provider. What can I expect after the procedure? After the procedure, it is common for people to: Have pain or discomfort at the IV site. Have nausea or vomiting. Have a sore throat or hoarseness. Have trouble concentrating. Feel cold or chills. Feel weak, sleepy, or tired (fatigue). Have soreness and body aches. These can affect parts of the body that were not involved in surgery. Follow these instructions at home: For the time period you were told by your health care provider:  Rest. Do  not participate in activities where you could fall or become injured. Do not drive or use machinery. Do not drink alcohol. Do not take sleeping pills or medicines that cause drowsiness. Do not make important decisions or sign legal documents. Do not take care of children on your own. General instructions Drink enough fluid to keep your urine pale yellow. If you have sleep apnea, surgery and certain medicines can increase your risk for breathing problems. Follow instructions from your health care provider about wearing your sleep device: Anytime you are sleeping, including during daytime naps. While taking prescription pain medicines, sleeping medicines, or medicines that make you drowsy. Return to your normal activities as told by your health care provider. Ask your health care provider what activities are safe for you. Take over-the-counter and prescription medicines only as told by your health care provider. Do not use any products that contain nicotine or tobacco. These products include cigarettes, chewing tobacco, and vaping devices, such as e-cigarettes. These can delay incision healing after surgery. If you need help quitting, ask your health care provider. Contact a health care provider if: You have nausea or vomiting that does not get better  with medicine. You vomit every time you eat or drink. You have pain that does not get better with medicine. You cannot urinate or have bloody urine. You develop a skin rash. You have a fever. Get help right away if: You have trouble breathing. You have chest pain. You vomit blood. These symptoms may be an emergency. Get help right away. Call 911. Do not wait to see if the symptoms will go away. Do not drive yourself to the hospital. Summary After the procedure, it is common to have a sore throat, hoarseness, nausea, vomiting, or to feel weak, sleepy, or fatigue. For the time period you were told by your health care provider, do not drive or use machinery. Get help right away if you have difficulty breathing, have chest pain, or vomit blood. These symptoms may be an emergency. This information is not intended to replace advice given to you by your health care provider. Make sure you discuss any questions you have with your health care provider. Document Revised: 03/08/2022 Document Reviewed: 03/08/2022 Elsevier Patient Education  2024 [employer].

## 2024-08-11 ENCOUNTER — Encounter (HOSPITAL_COMMUNITY)
Admission: RE | Admit: 2024-08-11 | Discharge: 2024-08-11 | Disposition: A | Discharge status: Home / Self Care | Source: Ambulatory Visit | Attending: Gastroenterology | Admitting: Gastroenterology

## 2024-08-11 ENCOUNTER — Encounter (HOSPITAL_COMMUNITY): Payer: Self-pay

## 2024-08-11 VITALS — BP 120/62 | HR 76 | Resp 18 | Ht 65.5 in | Wt 124.8 lb

## 2024-08-11 DIAGNOSIS — I10 Essential (primary) hypertension: Secondary | ICD-10-CM

## 2024-08-11 DIAGNOSIS — I129 Hypertensive chronic kidney disease with stage 1 through stage 4 chronic kidney disease, or unspecified chronic kidney disease: Secondary | ICD-10-CM | POA: Diagnosis not present

## 2024-08-11 DIAGNOSIS — N1831 Chronic kidney disease, stage 3a: Secondary | ICD-10-CM | POA: Diagnosis not present

## 2024-08-11 DIAGNOSIS — Z01818 Encounter for other preprocedural examination: Secondary | ICD-10-CM | POA: Diagnosis not present

## 2024-08-11 LAB — BASIC METABOLIC PANEL WITH GFR
Anion gap: 11 (ref 5–15)
BUN: 21 mg/dL (ref 8–23)
CO2: 28 mmol/L (ref 22–32)
Calcium: 9 mg/dL (ref 8.9–10.3)
Chloride: 101 mmol/L (ref 98–111)
Creatinine, Ser: 0.91 mg/dL (ref 0.44–1.00)
GFR, Estimated: >60 mL/min (ref >60)
Glucose, Bld: 123 mg/dL — ABNORMAL HIGH (ref 70–99)
Potassium: 3.6 mmol/L (ref 3.5–5.1)
Sodium: 140 mmol/L (ref 135–145)

## 2024-08-13 ENCOUNTER — Ambulatory Visit (HOSPITAL_COMMUNITY): Admitting: Anesthesiology

## 2024-08-13 ENCOUNTER — Ambulatory Visit (HOSPITAL_BASED_OUTPATIENT_CLINIC_OR_DEPARTMENT_OTHER): Admitting: Anesthesiology

## 2024-08-13 ENCOUNTER — Ambulatory Visit (HOSPITAL_COMMUNITY)
Admission: RE | Admit: 2024-08-13 | Discharge: 2024-08-13 | Disposition: A | Discharge status: Home / Self Care | Attending: Gastroenterology | Admitting: Gastroenterology

## 2024-08-13 ENCOUNTER — Other Ambulatory Visit: Payer: Self-pay

## 2024-08-13 ENCOUNTER — Encounter (HOSPITAL_COMMUNITY)
Admission: RE | Disposition: A | Discharge status: Home / Self Care | Payer: Self-pay | Source: Home / Self Care | Attending: Gastroenterology

## 2024-08-13 ENCOUNTER — Encounter (HOSPITAL_COMMUNITY): Payer: Self-pay | Admitting: Gastroenterology

## 2024-08-13 DIAGNOSIS — I10 Essential (primary) hypertension: Secondary | ICD-10-CM | POA: Diagnosis not present

## 2024-08-13 DIAGNOSIS — Z860101 Personal history of adenomatous and serrated colon polyps: Secondary | ICD-10-CM

## 2024-08-13 DIAGNOSIS — D122 Benign neoplasm of ascending colon: Secondary | ICD-10-CM

## 2024-08-13 DIAGNOSIS — N183 Chronic kidney disease, stage 3 unspecified: Secondary | ICD-10-CM | POA: Diagnosis not present

## 2024-08-13 DIAGNOSIS — K5730 Diverticulosis of large intestine without perforation or abscess without bleeding: Secondary | ICD-10-CM

## 2024-08-13 DIAGNOSIS — D123 Benign neoplasm of transverse colon: Secondary | ICD-10-CM | POA: Diagnosis not present

## 2024-08-13 DIAGNOSIS — I129 Hypertensive chronic kidney disease with stage 1 through stage 4 chronic kidney disease, or unspecified chronic kidney disease: Secondary | ICD-10-CM

## 2024-08-13 DIAGNOSIS — K635 Polyp of colon: Secondary | ICD-10-CM | POA: Diagnosis not present

## 2024-08-13 DIAGNOSIS — N289 Disorder of kidney and ureter, unspecified: Secondary | ICD-10-CM | POA: Insufficient documentation

## 2024-08-13 DIAGNOSIS — Z1211 Encounter for screening for malignant neoplasm of colon: Secondary | ICD-10-CM | POA: Diagnosis not present

## 2024-08-13 DIAGNOSIS — D120 Benign neoplasm of cecum: Secondary | ICD-10-CM

## 2024-08-13 DIAGNOSIS — Z860100 Personal history of colon polyps, unspecified: Secondary | ICD-10-CM

## 2024-08-13 DIAGNOSIS — K219 Gastro-esophageal reflux disease without esophagitis: Secondary | ICD-10-CM | POA: Insufficient documentation

## 2024-08-13 HISTORY — PX: COLONOSCOPY: SHX5424

## 2024-08-13 LAB — HM COLONOSCOPY

## 2024-08-13 SURGERY — COLONOSCOPY
Anesthesia: Monitor Anesthesia Care

## 2024-08-13 MED ORDER — LACTATED RINGERS IV SOLN: INTRAVENOUS | Status: DC

## 2024-08-13 MED ORDER — PROPOFOL 10 MG/ML IV BOLUS
INTRAVENOUS | Status: AC
  Filled 2024-08-13: qty 20

## 2024-08-13 MED ORDER — LABETALOL HCL 5 MG/ML IV SOLN
INTRAVENOUS | Status: DC | PRN
  Administered 2024-08-13: 5 mg via INTRAVENOUS

## 2024-08-13 MED ORDER — LABETALOL HCL 5 MG/ML IV SOLN
INTRAVENOUS | Status: AC
  Filled 2024-08-13: qty 4

## 2024-08-13 MED ORDER — PROPOFOL 10 MG/ML IV BOLUS
INTRAVENOUS | Status: DC | PRN
Start: 2024-08-13 — End: 2024-08-13
  Administered 2024-08-13: 100 mg via INTRAVENOUS
  Administered 2024-08-13 (×4): 50 mg via INTRAVENOUS

## 2024-08-13 MED ORDER — LACTATED RINGERS IV SOLN
INTRAVENOUS | Status: DC | PRN
Start: 2024-08-13 — End: 2024-08-13

## 2024-08-13 NOTE — Anesthesia Preprocedure Evaluation (Signed)
 Anesthesia Evaluation  Patient identified by MRN, date of birth, ID band Patient awake    Reviewed: Allergy & Precautions, H&P , NPO status , Patient's Chart, lab work & pertinent test results, reviewed documented beta blocker date and time   Airway Mallampati: II  TM Distance: >3 FB Neck ROM: full    Dental no notable dental hx.    Pulmonary neg pulmonary ROS   Pulmonary exam normal breath sounds clear to auscultation       Cardiovascular Exercise Tolerance: Good hypertension,  Rhythm:regular Rate:Normal     Neuro/Psych negative neurological ROS  negative psych ROS   GI/Hepatic Neg liver ROS,GERD  ,,  Endo/Other  negative endocrine ROS    Renal/GU Renal disease  negative genitourinary   Musculoskeletal   Abdominal   Peds  Hematology negative hematology ROS (+)   Anesthesia Other Findings   Reproductive/Obstetrics negative OB ROS                              Anesthesia Physical Anesthesia Plan  ASA: 3  Anesthesia Plan: General   Post-op Pain Management:    Induction:   PONV Risk Score and Plan: Propofol  infusion  Airway Management Planned:   Additional Equipment:   Intra-op Plan:   Post-operative Plan:   Informed Consent: I have reviewed the patients History and Physical, chart, labs and discussed the procedure including the risks, benefits and alternatives for the proposed anesthesia with the patient or authorized representative who has indicated his/her understanding and acceptance.     Dental Advisory Given  Plan Discussed with: CRNA  Anesthesia Plan Comments:         Anesthesia Quick Evaluation

## 2024-08-13 NOTE — Transfer of Care (Signed)
 Immediate Anesthesia Transfer of Care Note  Patient: Kelsey Nelson  Procedure(s) Performed: COLONOSCOPY  Patient Location: PACU  Anesthesia Type:MAC  Level of Consciousness: drowsy  Airway & Oxygen Therapy: Patient Spontanous Breathing  Post-op Assessment: Report given to RN  Post vital signs: Reviewed and stable  Last Vitals:  Vitals Value Taken Time  BP    Temp    Pulse    Resp    SpO2      Last Pain:  Vitals:   08/13/24 1135  TempSrc: Oral  PainSc: 0-No pain         Complications: No notable events documented.

## 2024-08-13 NOTE — H&P (Signed)
 Kelsey Nelson is an 89 y.o. female.   Chief Complaint: history of piecemeal polypectomy. HPI: Kelsey Nelson is a 89 y.o. female with past medical history of GERD, hypertension, hyperlipidemia who comes for history of piecemeal polypectomy.  The patient denies having any nausea, vomiting, fever, chills, hematochezia, melena, hematemesis, abdominal distention, abdominal pain, diarrhea, jaundice, pruritus or weight loss.  Patient had colonoscopy in July 2024, had an 18 mm ascending colon polyp removed in a piecemeal fashion, also had 10 other polyps removed.  Past Medical History:  Diagnosis Date   Acute cystitis with hematuria 10/26/2013   Allergic rhinitis 11/08/2017   Blindness of right eye    BMI between 19-24,adult 2010 145 LBS   Diverticula of small intestine Dec 2010   Diverticulosis    [place]   DIVERTICULOSIS, SIGMOID COLON 03/31/2008   Qualifier: Diagnosis of  By: Tita Lipps  Noted on abdominal and pelvic scan in 04/2011.  Extensive colonic diverticulosis , and possible muscular hypertrophy at rectosigmoid junction    Family hx of colon cancer 11/09/2013   Functional constipation 2010   GERD (gastroesophageal reflux disease)    HTN (hypertension)    Hyperlipemia    Pancreatic cyst 02/05/2013   Renal cyst 05/27/2011   Right eye trauma 1949   Loss of vision resulted at age 37   Schatzki's ring Dec 2010   Skin lesions, generalized 06/19/2015   Tubular adenoma 11/09/2013    Past Surgical History:  Procedure Laterality Date   ABDOMINAL HYSTERECTOMY     BREAST REDUCTION SURGERY  1992 BIL   COLONOSCOPY  DEC 2010   SIMPLE ADENOMAS (6-8), [place] TICS, SML IH   COLONOSCOPY N/A 12/08/2013   Procedure: COLONOSCOPY;  Surgeon: Claudis RAYMOND Rivet, MD;  [facility]: [facility];  Service: Endoscopy;  Laterality: N/A;  225   COLONOSCOPY WITH PROPOFOL  N/A 07/08/2023   Procedure: COLONOSCOPY WITH PROPOFOL ;  Surgeon: Eartha Angelia Sieving, MD;  Location: [facility];  Service: Gastroenterology;   Laterality: N/A;  9:15am; asa 3   ESOPHAGOGASTRODUODENOSCOPY (EGD) WITH PROPOFOL  N/A 01/13/2024   Procedure: ESOPHAGOGASTRODUODENOSCOPY (EGD) WITH PROPOFOL ;  Surgeon: Eartha Angelia Sieving, MD;  [facility];  Service: Gastroenterology;  Laterality: N/A;  2:30PM;ASA 3   EVISCERATION Right 05/08/2015   Procedure: EVISCERATION WITH IMPLANT RIGHT EYE;  Surgeon: Dow JULIANNA Burke, MD;  Location: [facility];  Service: Ophthalmology;  Laterality: Right;   EYE SURGERY  RIGHT 2005   EYE SURGERY  right 2016   removed and false eye placed in 08/2015   HEMOSTASIS CLIP PLACEMENT  07/08/2023   Procedure: HEMOSTASIS CLIP PLACEMENT;  Surgeon: Eartha Angelia Sieving, MD;  [facility];  Service: Gastroenterology;;   POLYPECTOMY  07/08/2023   Procedure: POLYPECTOMY;  Surgeon: Eartha Angelia Sieving, MD;  [facility];  Service: Gastroenterology;;   SUBMUCOSAL LIFTING INJECTION  07/08/2023   Procedure: SUBMUCOSAL LIFTING INJECTION;  Surgeon: Eartha Angelia Sieving, MD;  [facility];  Service: Gastroenterology;;   SUBMUCOSAL TATTOO INJECTION  07/08/2023   Procedure: SUBMUCOSAL TATTOO INJECTION;  Surgeon: Eartha Angelia Sieving, MD;  [facility];  Service: Gastroenterology;;   TOTAL ABDOMINAL HYSTERECTOMY W/ BILATERAL SALPINGOOPHORECTOMY  1985 FIBROIDS   UPPER GASTROINTESTINAL ENDOSCOPY  DEC 2010   Bx-MILD GASTRITIS/DUODENITIS, DUODENAL DIVERTICULA, SCHATZKI'S RING    Family History  Problem Relation Age of Onset   Pancreatic cancer Sister    Colon cancer Paternal Aunt    Colon polyps Neg Hx    Social History:  reports that she has never smoked. She has been exposed to tobacco smoke. She has never used smokeless tobacco. She reports that she does not drink alcohol and does not use drugs.  Allergies:  Allergies  Allergen Reactions   Codeine    Crestor  [Rosuvastatin ] Other (See Comments)    Can't recall   Fosamax [Alendronate Sodium]      heartburn    Facility-Administered Medications Prior to Admission  Medication Dose Route Frequency Provider Last Rate Last Admin   omalizumab  (XOLAIR ) prefilled syringe 300 mg  300 mg Subcutaneous Q28 days Iva Marty Saltness, MD   300 mg at 09/15/23 9176   Medications Prior to Admission  Medication Sig Dispense Refill   Cholecalciferol (VITAMIN D3 PO) Take 25 mcg by mouth daily.     clobetasol  ointment (TEMOVATE ) 0.05 % Use bid x 2 weeks then 2 x weekly on external tissue of vulva 30 g 1   conjugated estrogens  (PREMARIN ) vaginal cream Place 1 applicator vaginally. Every other day     diphenhydrAMINE  (BENADRYL  ALLERGY) 25 mg capsule Takes one at bedtime then 3 to 4 hours later takes a 2nd one     donepezil  (ARICEPT ) 10 MG tablet Take 1 tablet (10 mg total) by mouth at bedtime. 90 tablet 3   estradiol  (ESTRACE ) 0.1 MG/GM vaginal cream Discard plastic applicator. Insert a blueberry size amount (approximately 1 gram) of cream on fingertip inside vagina at bedtime every night for 1 week then every other night. For long term use. 30 g 3   fexofenadine  (ALLEGRA  ALLERGY) 180 MG tablet Take 1 tablet (180 mg total) by mouth every morning. 30 tablet 5   fluticasone  (FLONASE ) 50 MCG/ACT nasal spray Place 1 spray into both nostrils daily. 16 g 5   MAGNESIUM  PO Take by mouth.     Multiple Vitamin (MULTIVITAMIN PO) Take by mouth.     OVER THE COUNTER MEDICATION Multi Collagen Protein - Patient states that she takes 1 scoop a day.     OVER THE COUNTER MEDICATION One daily hylands leg cramp relief     OVER THE COUNTER MEDICATION Omega xl joint and muscle support daily     prednisoLONE acetate (PRED FORTE) 1 % ophthalmic suspension SMARTSIG:In Eye(s)     ramipril  (ALTACE ) 10 MG capsule Take one capsule by mouth once daily for blood pressure 90 capsule 3   rifaximin  (XIFAXAN ) 550 MG TABS tablet Take 1 tablet (550 mg total) by mouth 3 (three) times daily. 42 tablet 0   senna-docusate (SENOKOT-S) 8.6-50 MG  tablet Take 2 tablets by mouth at bedtime as needed for mild constipation. 30 tablet 0   polyethylene glycol (MIRALAX ) 17 g packet Take 17 g by mouth daily. 30 each 0    Results for orders placed or performed during the hospital encounter of 08/11/24 (from the past 48 hours)  Basic metabolic panel     Status: Abnormal   Collection Time: 08/11/24  1:00 PM  Result Value Ref Range   Sodium 140 135 - 145 mmol/L   Potassium 3.6 3.5 - 5.1 mmol/L   Chloride 101 98 - 111 mmol/L   CO2 28 22 - 32 mmol/L   Glucose, Bld 123 (H) 70 - 99 mg/dL    Comment: Glucose reference range applies only to samples taken after fasting for at least 8 hours.   BUN 21 8 - 23 mg/dL   Creatinine, Ser 9.08 0.44 - 1.00 mg/dL   Calcium  9.0 8.9 - 10.3 mg/dL   GFR, Estimated >  60 >60 mL/min    Comment: (NOTE) Calculated using the CKD-EPI Creatinine Equation (2021)    Anion gap 11 5 - 15    Comment: Performed at [facility], [address], [place], [place] [address]   No results found.  Review of Systems  All other systems reviewed and are negative.   Blood pressure (!) 148/67, pulse 68, temperature 98.6 F (37 C), temperature source Oral, resp. rate 17, height 5' 5 (1.651 m), weight 56.6 kg, SpO2 97%. Physical Exam  GENERAL: The patient is AO x3, in no acute distress. HEENT: Head is normocephalic and atraumatic. EOMI are intact. Mouth is well hydrated and without lesions. NECK: Supple. No masses LUNGS: Clear to auscultation. No presence of rhonchi/wheezing/rales. Adequate chest expansion HEART: RRR, normal s1 and s2. ABDOMEN: Soft, nontender, no guarding, no peritoneal signs, and nondistended. BS +. No masses. EXTREMITIES: Without any cyanosis, clubbing, rash, lesions or edema. NEUROLOGIC: AOx3, no focal motor deficit. SKIN: no jaundice, no rashes  Assessment/Plan TAHIRAH SARA is a 89 y.o. female with past medical history of GERD, hypertension, hyperlipidemia who comes for history of piecemeal polypectomy.   Will proceed with colonoscopy.  Toribio Eartha Flavors, MD 08/13/2024, 11:58 AM

## 2024-08-13 NOTE — Op Note (Signed)
 [facility] Patient Name: Kelsey Nelson Procedure Date: 08/13/2024 11:58 AM MRN: [id number] Date of Birth: [birth year] Attending MD: Toribio Fortune , , [id number] CSN: [id number] Age: 89 Admit Type: Outpatient Procedure:                Colonoscopy Indications:              Surveillance: History of numerous (> 10) adenomas                            on last colonoscopy (< 3 yrs), Surveillance:                            Personal history of piecemeal removal of adenoma on                            last colonoscopy (less than 1 year ago) Providers:                Toribio Fortune, Olam Ada, RN, Jon Loge Referring MD:              Medicines:                Monitored Anesthesia Care Complications:            No immediate complications. Estimated Blood Loss:     Estimated blood loss: none. Procedure:                Pre-Anesthesia Assessment:                           - Prior to the procedure, a History and Physical                            was performed, and patient medications, allergies                            and sensitivities were reviewed. The patient's                            tolerance of previous anesthesia was reviewed.                           - The risks and benefits of the procedure and the                            sedation options and risks were discussed with the                            patient. All questions were answered and informed                            consent was obtained.                           - ASA Grade Assessment: II - A patient with mild  systemic disease.                           - Adequate visualization was aided with the use of                            a transparent cap attached to the distal part of                            the endoscope.                           After obtaining informed consent, the colonoscope                            was passed under direct vision. Throughout the                             procedure, the patient's blood pressure, pulse, and                            oxygen saturations were monitored continuously. The                            [id number]-[id number] ([id number]) Peds Colon was introduced                            through the anus and advanced to the the cecum,                            identified by appendiceal orifice and ileocecal                            valve. The colonoscopy was performed without                            difficulty. The patient tolerated the procedure                            well. The quality of the bowel preparation was good. Scope In: 12:17:28 PM Scope Out: 12:44:16 PM Scope Withdrawal Time: 0 hours 18 minutes 16 seconds  Total Procedure Duration: 0 hours 26 minutes 48 seconds  Findings:      The perianal and digital rectal examinations were normal.      A 1 mm polyp was found in the cecum. The polyp was sessile. The polyp       was removed with a cold biopsy forceps. Resection and retrieval were       complete. To close a defect after polypectomy due to significant       bleeding, two hemostatic clips were successfully placed (MR safe). Clip       manufacturer: [employer]. There was no bleeding at the end of the       procedure.      Five sessile polyps were found in the transverse colon, ascending colon       and cecum.  The polyps were 2 to 5 mm in size. These polyps were removed       with a cold snare. Resection and retrieval were complete.      Scattered small-mouthed diverticula were found in the sigmoid colon and       descending colon.      The retroflexed view of the distal rectum and anal verge was normal and       showed no anal or rectal abnormalities. Impression:               - One 1 mm polyp in the cecum, removed with a cold                            biopsy forceps. Resected and retrieved. Clip                            manufacturer: [employer]. Clips (MR safe)                             were placed.                           - Five 2 to 5 mm polyps in the transverse colon, in                            the ascending colon and in the cecum, removed with                            a cold snare. Resected and retrieved.                           - Diverticulosis in the sigmoid colon and in the                            descending colon.                           - The distal rectum and anal verge are normal on                            retroflexion view. Moderate Sedation:      Per Anesthesia Care Recommendation:           - Discharge patient to home (ambulatory).                           - Resume previous diet.                           - Await pathology results.                           - Repeat colonoscopy is not recommended due to                            current age (30 years or older) for screening  purposes. Procedure Code(s):        --- Professional ---                           [phone], Colonoscopy, flexible; with removal of                            tumor(s), polyp(s), or other lesion(s) by snare                            technique                           45380, 59, Colonoscopy, flexible; with biopsy,                            single or multiple Diagnosis Code(s):        --- Professional ---                           Z86.010, Personal history of colonic polyps                           D12.0, Benign neoplasm of cecum                           D12.3, Benign neoplasm of transverse colon (hepatic                            flexure or splenic flexure)                           D12.2, Benign neoplasm of ascending colon                           Z09, Encounter for follow-up examination after                            completed treatment for conditions other than                            malignant neoplasm                           K57.30, Diverticulosis of large intestine without                            perforation or abscess  without bleeding CPT copyright 2022 American Medical Association. All rights reserved. The codes documented in this report are preliminary and upon coder review may  be revised to meet current compliance requirements. Toribio Fortune, MD Toribio Fortune,  08/13/2024 12:58:11 PM This report has been signed electronically. Number of Addenda: 0

## 2024-08-13 NOTE — Discharge Instructions (Signed)
You are being discharged to home.  Resume your previous diet.  We are waiting for your pathology results.  Your physician has indicated that a repeat colonoscopy is not recommended due to your current age (66 years or older) for screening purposes.  

## 2024-08-16 ENCOUNTER — Encounter (INDEPENDENT_AMBULATORY_CARE_PROVIDER_SITE_OTHER): Payer: Self-pay | Admitting: *Deleted

## 2024-08-16 LAB — SURGICAL PATHOLOGY

## 2024-08-17 ENCOUNTER — Ambulatory Visit: Payer: Self-pay | Admitting: Gastroenterology

## 2024-08-21 NOTE — Anesthesia Postprocedure Evaluation (Signed)
 Anesthesia Post Note  Patient: Kelsey Nelson  Procedure(s) Performed: COLONOSCOPY  Patient location during evaluation: Phase II Anesthesia Type: MAC Level of consciousness: awake Pain management: pain level controlled Vital Signs Assessment: post-procedure vital signs reviewed and stable Respiratory status: spontaneous breathing and respiratory function stable Cardiovascular status: blood pressure returned to baseline and stable Postop Assessment: no headache and no apparent nausea or vomiting Anesthetic complications: no Comments: Late entry   No notable events documented.   Last Vitals:  Vitals:   08/13/24 1135 08/13/24 1249  BP: (!) 148/67 (!) 140/69  Pulse: 68 61  Resp: 17 20  Temp: 37 C 36.6 C  SpO2: 97% 97%    Last Pain:  Vitals:   08/16/24 1621  TempSrc:   PainSc: 0-No pain                 Yvonna JINNY Bosworth

## 2024-08-25 ENCOUNTER — Telehealth (INDEPENDENT_AMBULATORY_CARE_PROVIDER_SITE_OTHER): Payer: Self-pay | Admitting: Gastroenterology

## 2024-08-25 ENCOUNTER — Encounter (HOSPITAL_COMMUNITY): Payer: Self-pay

## 2024-08-25 ENCOUNTER — Inpatient Hospital Stay (HOSPITAL_COMMUNITY)
Admission: RE | Admit: 2024-08-25 | Discharge: 2024-08-25 | Disposition: A | Discharge status: Home / Self Care | Source: Ambulatory Visit

## 2024-08-25 ENCOUNTER — Encounter: Payer: Self-pay | Admitting: *Deleted

## 2024-08-25 NOTE — Telephone Encounter (Signed)
 Pt called into office and asked to speak to [facility]. Advised pt that Mitzie is not in today and if I could take a messages. Pt stated she had a questions about her bowels. Advised pt that I could take a message and send it to another provider. Pt seemed to hesitate. Pt then stated she had a colonoscopy and is not having normal bowel movements, pt states that is not unusual for her. Pt states she has been having black stool for the last 2 days. Also have a lot of gas. Pt does have history of abdominal pain so pt states nothing new. Please advise. Thank you!

## 2024-08-25 NOTE — Telephone Encounter (Signed)
 Spoke with the patient today.  She reports that prior to her colonoscopy her stools were loose but they were brown.  States that the week after her colonoscopy her stools were the same but since Sunday she has noticed the stools are black and tarry.  Denies taking any NSAIDs or anticoagulants.  I discussed with her that I suspect her symptoms are likely related to an upper GI bleeding.  She denies any chest pain, shortness of breath, nausea, vomiting or abdominal pain.  We will proceed with an EGD tomorrow to evaluate her symptoms further.  I also discussed with her that I thought it was less likely related to lower gastrointestinal bleeding as the changes in the stool happened a week after her colonoscopy.  Mindy/Tammy/Autumn, can you please schedule a esophagogastroduodenospy for tomorrow? Dx: Melena. Room: 3  Thanks,  Toribio Fortune, MD Gastroenterology and Hepatology [facility]

## 2024-08-25 NOTE — Telephone Encounter (Signed)
 Pt has been scheduled for 08/26/24, went over instructions for EGD with pt and pt verbalized understanding of instructions.Kelsey Nelson

## 2024-08-25 NOTE — Telephone Encounter (Signed)
 Noted

## 2024-08-25 NOTE — Anesthesia Preprocedure Evaluation (Signed)
 Anesthesia Evaluation  Patient identified by MRN, date of birth, ID band Patient awake    Reviewed: Allergy & Precautions, H&P , NPO status , Patient's Chart, lab work & pertinent test results, reviewed documented beta blocker date and time   Airway Mallampati: II  TM Distance: >3 FB Neck ROM: full    Dental no notable dental hx. (+) Dental Advisory Given, Teeth Intact,    Pulmonary neg pulmonary ROS   Pulmonary exam normal breath sounds clear to auscultation       Cardiovascular Exercise Tolerance: Good hypertension, Normal cardiovascular exam Rhythm:regular Rate:Normal     Neuro/Psych negative neurological ROS  negative psych ROS   GI/Hepatic Neg liver ROS,GERD  ,,  Endo/Other  negative endocrine ROS    Renal/GU Renal disease  negative genitourinary   Musculoskeletal   Abdominal   Peds  Hematology negative hematology ROS (+)   Anesthesia Other Findings   Reproductive/Obstetrics negative OB ROS                              Anesthesia Physical Anesthesia Plan  ASA: 2  Anesthesia Plan: General   Post-op Pain Management: Minimal or no pain anticipated   Induction: Intravenous  PONV Risk Score and Plan: Propofol  infusion  Airway Management Planned: Natural Airway and Nasal Cannula  Additional Equipment: None  Intra-op Plan:   Post-operative Plan:   Informed Consent: I have reviewed the patients History and Physical, chart, labs and discussed the procedure including the risks, benefits and alternatives for the proposed anesthesia with the patient or authorized representative who has indicated his/her understanding and acceptance.     Dental Advisory Given  Plan Discussed with: CRNA  Anesthesia Plan Comments:          Anesthesia Quick Evaluation

## 2024-08-26 ENCOUNTER — Ambulatory Visit (HOSPITAL_COMMUNITY)
Admission: RE | Admit: 2024-08-26 | Discharge: 2024-08-26 | Disposition: A | Discharge status: Home / Self Care | Attending: Gastroenterology | Admitting: Gastroenterology

## 2024-08-26 ENCOUNTER — Encounter (HOSPITAL_COMMUNITY): Payer: Self-pay | Admitting: Gastroenterology

## 2024-08-26 ENCOUNTER — Ambulatory Visit (HOSPITAL_BASED_OUTPATIENT_CLINIC_OR_DEPARTMENT_OTHER): Payer: Self-pay | Admitting: Anesthesiology

## 2024-08-26 ENCOUNTER — Encounter (HOSPITAL_COMMUNITY)
Admission: RE | Disposition: A | Discharge status: Home / Self Care | Payer: Self-pay | Source: Home / Self Care | Attending: Gastroenterology

## 2024-08-26 ENCOUNTER — Other Ambulatory Visit: Payer: Self-pay

## 2024-08-26 ENCOUNTER — Ambulatory Visit (INDEPENDENT_AMBULATORY_CARE_PROVIDER_SITE_OTHER): Payer: Self-pay | Admitting: Gastroenterology

## 2024-08-26 ENCOUNTER — Ambulatory Visit (HOSPITAL_COMMUNITY): Payer: Self-pay | Admitting: Anesthesiology

## 2024-08-26 DIAGNOSIS — D175 Benign lipomatous neoplasm of intra-abdominal organs: Secondary | ICD-10-CM | POA: Diagnosis not present

## 2024-08-26 DIAGNOSIS — I10 Essential (primary) hypertension: Secondary | ICD-10-CM | POA: Insufficient documentation

## 2024-08-26 DIAGNOSIS — Z7722 Contact with and (suspected) exposure to environmental tobacco smoke (acute) (chronic): Secondary | ICD-10-CM | POA: Insufficient documentation

## 2024-08-26 DIAGNOSIS — K317 Polyp of stomach and duodenum: Secondary | ICD-10-CM | POA: Diagnosis not present

## 2024-08-26 DIAGNOSIS — K449 Diaphragmatic hernia without obstruction or gangrene: Secondary | ICD-10-CM | POA: Insufficient documentation

## 2024-08-26 DIAGNOSIS — K921 Melena: Secondary | ICD-10-CM | POA: Diagnosis not present

## 2024-08-26 HISTORY — PX: ESOPHAGOGASTRODUODENOSCOPY: SHX5428

## 2024-08-26 LAB — CBC
HCT: 37.3 % (ref 36.0–46.0)
Hemoglobin: 12.2 g/dL (ref 12.0–15.0)
MCH: 31 pg (ref 26.0–34.0)
MCHC: 32.7 g/dL (ref 30.0–36.0)
MCV: 94.9 fL (ref 80.0–100.0)
Platelets: 265 K/uL (ref 150–400)
RBC: 3.93 MIL/uL (ref 3.87–5.11)
RDW: 13.2 % (ref 11.5–15.5)
WBC: 5.7 K/uL (ref 4.0–10.5)
nRBC: 0 % (ref 0.0–0.2)

## 2024-08-26 SURGERY — EGD (ESOPHAGOGASTRODUODENOSCOPY)
Anesthesia: General

## 2024-08-26 MED ORDER — LIDOCAINE 2% (20 MG/ML) 5 ML SYRINGE
INTRAMUSCULAR | Status: DC | PRN
  Administered 2024-08-26: 50 mg via INTRAVENOUS

## 2024-08-26 MED ORDER — PROPOFOL 10 MG/ML IV BOLUS
INTRAVENOUS | Status: DC | PRN
  Administered 2024-08-26: 50 mg via INTRAVENOUS
  Administered 2024-08-26: 100 mg via INTRAVENOUS

## 2024-08-26 MED ORDER — LACTATED RINGERS IV SOLN: INTRAVENOUS | Status: DC

## 2024-08-26 NOTE — Op Note (Addendum)
 [facility] Patient Name: Kelsey Nelson Procedure Date: 08/26/2024 10:32 AM MRN: [id number] Date of Birth: 1934-06-02 Attending MD: Toribio Fortune , , [id number] CSN: [id number] Age: 89 Admit Type: Outpatient Procedure:                Upper GI endoscopy Indications:              Melena Providers:                Toribio Fortune, Harlene Lips Rosina Sprague Referring MD:              Medicines:                Monitored Anesthesia Care Complications:            No immediate complications. Estimated Blood Loss:     Estimated blood loss: none. Procedure:                Pre-Anesthesia Assessment:                           - Prior to the procedure, a History and Physical                            was performed, and patient medications, allergies                            and sensitivities were reviewed. The patient's                            tolerance of previous anesthesia was reviewed.                           - The risks and benefits of the procedure and the                            sedation options and risks were discussed with the                            patient. All questions were answered and informed                            consent was obtained.                           - ASA Grade Assessment: II - A patient with mild                            systemic disease.                           After obtaining informed consent, the endoscope was                            passed under direct vision. Throughout the                            procedure, the patient's blood pressure, pulse, and  oxygen saturations were monitored continuously. The                            [id number] ([id number]) Upper was introduced through                            the mouth, and advanced to the second part of                            duodenum. The upper GI endoscopy was accomplished                            without difficulty. The patient tolerated the                             procedure well. Scope In: 10:55:35 AM Scope Out: 10:58:34 AM Total Procedure Duration: 0 hours 2 minutes 59 seconds  Findings:      A 2 cm hiatal hernia was present.      A few small sessile fundic gland polyps with no bleeding were found in       the gastric body.      There was a small lipoma in the second portion of the duodenum. Impression:               - 2 cm hiatal hernia.                           - A few fundic gland polyps.                           - Duodenal lipoma.                           - No specimens collected. Moderate Sedation:      Per Anesthesia Care Recommendation:           - Discharge patient to home (ambulatory).                           - Resume previous diet.                           - Continue present medications.                           - No ibuprofen, naproxen, or other non-steroidal                            anti-inflammatory drugs.                           - Check CBC Procedure Code(s):        --- Professional ---                           [phone], Esophagogastroduodenoscopy, flexible,  transoral; diagnostic, including collection of                            specimen(s) by brushing or washing, when performed                            (separate procedure) Diagnosis Code(s):        --- Professional ---                           K44.9, Diaphragmatic hernia without obstruction or                            gangrene                           K31.7, Polyp of stomach and duodenum                           D17.5, Benign lipomatous neoplasm of                            intra-abdominal organs                           K92.1, Melena (includes Hematochezia) CPT copyright 2022 American Medical Association. All rights reserved. The codes documented in this report are preliminary and upon coder review may  be revised to meet current compliance requirements. Toribio Fortune, MD Toribio Fortune,  08/26/2024  11:03:00 AM This report has been signed electronically. Number of Addenda: 0

## 2024-08-26 NOTE — Anesthesia Postprocedure Evaluation (Signed)
 Anesthesia Post Note  Patient: Kelsey Nelson  Procedure(s) Performed: EGD (ESOPHAGOGASTRODUODENOSCOPY)  Patient location during evaluation: Short Stay Anesthesia Type: General Level of consciousness: awake and alert Pain management: pain level controlled Vital Signs Assessment: post-procedure vital signs reviewed and stable Respiratory status: spontaneous breathing Cardiovascular status: blood pressure returned to baseline Postop Assessment: no apparent nausea or vomiting Anesthetic complications: no   No notable events documented.   Last Vitals:  Vitals:   08/26/24 1009 08/26/24 1108  BP: 125/69 (!) 105/45  Pulse: 64 65  Resp: 12 12  Temp: 36.6 C (!) 36.4 C  SpO2: 99% 99%    Last Pain:  Vitals:   08/26/24 1108  TempSrc: Oral  PainSc: 0-No pain                 Vieno Tarrant

## 2024-08-26 NOTE — Discharge Instructions (Signed)
 You are being discharged to home.  Resume your previous diet.  Continue your present medications.  Do not take any ibuprofen (including Advil, Motrin or Nuprin), naproxen, or other non-steroidal anti-inflammatory drugs.

## 2024-08-26 NOTE — Transfer of Care (Signed)
 Immediate Anesthesia Transfer of Care Note  Patient: Kelsey Nelson  Procedure(s) Performed: EGD (ESOPHAGOGASTRODUODENOSCOPY)  Patient Location: Short Stay  Anesthesia Type:General  Level of Consciousness: awake  Airway & Oxygen Therapy: Patient Spontanous Breathing  Post-op Assessment: Report given to RN  Post vital signs: Reviewed and stable  Last Vitals:  Vitals Value Taken Time  BP    Temp    Pulse    Resp    SpO2      Last Pain:  Vitals:   08/26/24 1050  TempSrc:   PainSc: 0-No pain      Patients Stated Pain Goal: 6 (08/26/24 1009)  Complications: No notable events documented.

## 2024-08-26 NOTE — H&P (Signed)
 Kelsey Nelson is an 89 y.o. female.   Chief Complaint: melena HPI: Kelsey Nelson is a 89 y.o. female with past medical history of GERD, hypertension, hyperlipidemia who comes for evaluation of melena.  States having some episodes of melena since Sunday. No NSAID use. The patient denies having any nausea, vomiting, fever, chills, hematochezia,  hematemesis, abdominal distention, abdominal pain, diarrhea, jaundice, pruritus or weight loss.   Past Medical History:  Diagnosis Date   Acute cystitis with hematuria 10/26/2013   Allergic rhinitis 11/08/2017   Blindness of right eye    BMI between 19-24,adult 2010 145 LBS   Diverticula of small intestine Dec 2010   Diverticulosis    [place]   DIVERTICULOSIS, SIGMOID COLON 03/31/2008   Qualifier: Diagnosis of  By: Tita Lipps  Noted on abdominal and pelvic scan in 04/2011.  Extensive colonic diverticulosis , and possible muscular hypertrophy at rectosigmoid junction    Family hx of colon cancer 11/09/2013   Functional constipation 2010   GERD (gastroesophageal reflux disease)    HTN (hypertension)    Hyperlipemia    Pancreatic cyst 02/05/2013   Renal cyst 05/27/2011   Right eye trauma 1949   Loss of vision resulted at age 26   Schatzki's ring Dec 2010   Skin lesions, generalized 06/19/2015   Tubular adenoma 11/09/2013    Past Surgical History:  Procedure Laterality Date   ABDOMINAL HYSTERECTOMY     BREAST REDUCTION SURGERY  1992 BIL   COLONOSCOPY  DEC 2010   SIMPLE ADENOMAS (6-8), [place] TICS, S[facility] IH   COLONOSCOPY N/A 12/08/2013   Procedure: COLONOSCOPY;  Surgeon: Claudis RAYMOND Rivet, MD;  Location: [facility];  Service: Endoscopy;  Laterality: N/A;  225   COLONOSCOPY N/A 08/13/2024   Procedure: COLONOSCOPY;  Surgeon: Eartha Angelia Sieving, MD;  Location: [facility];  Service: Gastroenterology;  Laterality: N/A;  1:15 pm, asa 3   COLONOSCOPY WITH PROPOFOL  N/A 07/08/2023   Procedure: COLONOSCOPY WITH PROPOFOL ;  Surgeon: Eartha Angelia Sieving, MD;  Location: [facility];  Service: Gastroenterology;  Laterality: N/A;  9:15am; asa 3   ESOPHAGOGASTRODUODENOSCOPY (EGD) WITH PROPOFOL  N/A 01/13/2024   Procedure: ESOPHAGOGASTRODUODENOSCOPY (EGD) WITH PROPOFOL ;  Surgeon: Eartha Angelia Sieving, MD;  Location: [facility];  Service: Gastroenterology;  Laterality: N/A;  2:30PM;ASA 3   EVISCERATION Right 05/08/2015   Procedure: EVISCERATION WITH IMPLANT RIGHT EYE;  Surgeon: Dow JULIANNA Burke, MD;  Location: [facility];  Service: Ophthalmology;  Laterality: Right;   EYE SURGERY  RIGHT 2005   EYE SURGERY  right 2016   removed and false eye placed in 08/2015   HEMOSTASIS CLIP PLACEMENT  07/08/2023   Procedure: HEMOSTASIS CLIP PLACEMENT;  Surgeon: Eartha Angelia Sieving, MD;  Location: [facility];  Service: Gastroenterology;;   POLYPECTOMY  07/08/2023   Procedure: POLYPECTOMY;  Surgeon: Eartha Angelia Sieving, MD;  [facility];  Service: Gastroenterology;;   SUBMUCOSAL LIFTING INJECTION  07/08/2023   Procedure: SUBMUCOSAL LIFTING INJECTION;  Surgeon: Eartha Angelia Sieving, MD;  [facility];  Service: Gastroenterology;;   SUBMUCOSAL TATTOO INJECTION  07/08/2023   Procedure: SUBMUCOSAL TATTOO INJECTION;  Surgeon: Eartha Angelia Sieving, MD;  [facility]: [facility];  Service: Gastroenterology;;   TOTAL ABDOMINAL HYSTERECTOMY W/ BILATERAL SALPINGOOPHORECTOMY  1985 FIBROIDS   UPPER GASTROINTESTINAL ENDOSCOPY  DEC 2010   Bx-MILD GASTRITIS/DUODENITIS, DUODENAL DIVERTICULA, SCHATZKI'S RING    Family History  Problem Relation Age of Onset   Pancreatic cancer Sister    Colon cancer  Paternal Aunt    Colon polyps Neg Hx    Social History:  reports that she has never smoked. She has been exposed to tobacco smoke. She has never used smokeless tobacco. She reports that she does not drink alcohol and does not use drugs.  Allergies:  Allergies  Allergen Reactions   Codeine    Crestor  [Rosuvastatin ] Other (See  Comments)    Can't recall   Fosamax [Alendronate Sodium]     heartburn    Facility-Administered Medications Prior to Admission  Medication Dose Route Frequency Provider Last Rate Last Admin   omalizumab  (XOLAIR ) prefilled syringe 300 mg  300 mg Subcutaneous Q28 days Iva Marty Saltness, MD   300 mg at 09/15/23 9176   Medications Prior to Admission  Medication Sig Dispense Refill   Cholecalciferol (VITAMIN D3 PO) Take 25 mcg by mouth daily.     clobetasol  ointment (TEMOVATE ) 0.05 % Use bid x 2 weeks then 2 x weekly on external tissue of vulva 30 g 1   conjugated estrogens  (PREMARIN ) vaginal cream Place 1 applicator vaginally. Every other day     diphenhydrAMINE  (BENADRYL  ALLERGY) 25 mg capsule Takes one at bedtime then 3 to 4 hours later takes a 2nd one     donepezil  (ARICEPT ) 10 MG tablet Take 1 tablet (10 mg total) by mouth at bedtime. 90 tablet 3   estradiol  (ESTRACE ) 0.1 MG/GM vaginal cream Discard plastic applicator. Insert a blueberry size amount (approximately 1 gram) of cream on fingertip inside vagina at bedtime every night for 1 week then every other night. For long term use. 30 g 3   fexofenadine  (ALLEGRA  ALLERGY) 180 MG tablet Take 1 tablet (180 mg total) by mouth every morning. 30 tablet 5   fluticasone  (FLONASE ) 50 MCG/ACT nasal spray Place 1 spray into both nostrils daily. 16 g 5   MAGNESIUM  PO Take by mouth.     Multiple Vitamin (MULTIVITAMIN PO) Take by mouth.     OVER THE COUNTER MEDICATION Multi Collagen Protein - Patient states that she takes 1 scoop a day.     OVER THE COUNTER MEDICATION One daily hylands leg cramp relief     OVER THE COUNTER MEDICATION Omega xl joint and muscle support daily     polyethylene glycol (MIRALAX ) 17 g packet Take 17 g by mouth daily. 30 each 0   prednisoLONE acetate (PRED FORTE) 1 % ophthalmic suspension SMARTSIG:In Eye(s)     ramipril  (ALTACE ) 10 MG capsule Take one capsule by mouth once daily for blood pressure 90 capsule 3    senna-docusate (SENOKOT-S) 8.6-50 MG tablet Take 2 tablets by mouth at bedtime as needed for mild constipation. 30 tablet 0   rifaximin  (XIFAXAN ) 550 MG TABS tablet Take 1 tablet (550 mg total) by mouth 3 (three) times daily. 42 tablet 0    No results found for this or any previous visit (from the past 48 hours). No results found.  Review of Systems  Gastrointestinal:  Positive for blood in stool.  All other systems reviewed and are negative.   Blood pressure 125/69, pulse 64, temperature 97.8 F (36.6 C), temperature source Oral, resp. rate 12, height 5' 5 (1.651 m), weight 56.6 kg, SpO2 99%. Physical Exam  GENERAL: The patient is AO x3, in no acute distress. HEENT: Head is normocephalic and atraumatic. EOMI are intact. Mouth is well hydrated and without lesions. NECK: Supple. No masses LUNGS: Clear to auscultation. No presence of rhonchi/wheezing/rales. Adequate chest expansion HEART: RRR, normal s1 and s2.  ABDOMEN: Soft, nontender, no guarding, no peritoneal signs, and nondistended. BS +. No masses. EXTREMITIES: Without any cyanosis, clubbing, rash, lesions or edema. NEUROLOGIC: AOx3, no focal motor deficit. SKIN: no jaundice, no rashes  Assessment/Plan BERGEN MELLE is a 89 y.o. female with past medical history of GERD, hypertension, hyperlipidemia who comes for evaluation of melena. Will proceed with EGD.  Toribio Eartha Flavors, MD 08/26/2024, 10:13 AM

## 2024-08-30 ENCOUNTER — Encounter (INDEPENDENT_AMBULATORY_CARE_PROVIDER_SITE_OTHER): Payer: Self-pay | Admitting: *Deleted

## 2024-08-30 NOTE — Progress Notes (Signed)
 Patient result letter mailed Patient's PCP is on EPIC

## 2024-08-31 ENCOUNTER — Encounter (HOSPITAL_COMMUNITY): Payer: Self-pay | Admitting: Gastroenterology

## 2024-09-13 DIAGNOSIS — H5789 Other specified disorders of eye and adnexa: Secondary | ICD-10-CM | POA: Diagnosis not present

## 2024-09-13 DIAGNOSIS — L539 Erythematous condition, unspecified: Secondary | ICD-10-CM | POA: Diagnosis not present

## 2024-09-13 DIAGNOSIS — Z970 Presence of artificial eye: Secondary | ICD-10-CM | POA: Diagnosis not present

## 2024-09-13 DIAGNOSIS — Z9889 Other specified postprocedural states: Secondary | ICD-10-CM | POA: Diagnosis not present

## 2024-09-13 NOTE — Progress Notes (Signed)
 Ophthalmology Department Clinical Visit Note  09/13/24      CHIEF COMPLAINT Patient presents for Consult Kelsey Nelson is a 89 y.o. female who presents in to oculoplastic clinic today for squamous blepharitis on BLL and BUL. Pt has a hx of ocular hypertension, keratoconjuctivitis Sicca, Pseudo os, and other chronic allergic conjunctivitis od. Right eye is a prosthetic and va in the os sees wnl. Denies any pains/discomforts at this time but c/o intermittent infections/severe itchiness. Symptoms have been going on for about 1 year./)   HISTORY OF PRESENT ILLNESS: Kelsey Nelson is a 89 y.o. female who presents to clinic for: evaluation of recurrent right lid swelling and discharge in the setting of prior enucleation with prosthesis   HPI     Consult    Additional comments: Kelsey Nelson is a 89 y.o. female who presents in to oculoplastic clinic today for squamous blepharitis on BLL and BUL. Pt has a hx of ocular hypertension, keratoconjuctivitis Sicca, Pseudo os, and other chronic allergic conjunctivitis od. Right eye is a prosthetic and va in the os sees wnl. Denies any pains/discomforts at this time but c/o intermittent infections/severe itchiness. Symptoms have been going on for about 1 year.         Comments   Gtts: Systane, emycin and olopatadine No DM. Takes multivitamin.  No GLP1 meds.       Last edited by Nidia Bartley Hutchinson, COA on 09/13/2024  9:37 AM.        HISTORICAL INFORMATION:   Selected notes from the MEDICAL RECORD NUMBER    CURRENT MEDICATIONS: Encounter Medications[1]  Referring physician: Referral Self No address on file  REVIEW OF SYSTEMS: ROS   Positive for: Eyes Last edited by Nidia Bartley Hutchinson, COA on 09/13/2024  9:32 AM.      ALLERGIES Allergies[2]  PAST MEDICAL HISTORY Medical History[3] Surgical History[4]  FAMILY HISTORY Family History[5]  SOCIAL HISTORY Social History[6]      OPHTHALMIC EXAM:  Base Eye Exam     Visual Acuity  (Snellen - Linear)       Right Left   Dist [place] Prothesis 20/30 -2   Dist ph [place]  NI         Tonometry (Tonopen: Tetracaine  OS, 9:41 AM)       Right Left   Pressure  19         Pupils       APD   Right prothesis   Left None         Neuro/Psych     Oriented x3: Yes   Mood/Affect: Normal         Dilation   Last DFE was a couple months ago.           Slit Lamp and Fundus Exam     External Exam       Right Left   External Prosthesis Normal appearance and texture         Slit Lamp Exam       Right Left   Lids/Lashes MGD with UL ptosis and dermatochalasis, lower lid erythema with fissures Lower lid laxity, MGD 2+, lower lid erythema   Conjunctiva/Sclera Mucoid stranding, irregular temporal ridge Clear conjunctiva, White and quiet episclera/sclera   Cornea  Clear cornea, No fluorescein  staining noted   Anterior Chamber  Deep and Quiet   Iris  Round and reactive   Lens  Posterior chamber intraocular lens   Anterior Vitreous  Normal  IMAGING AND PROCEDURES:         ASSESSMENT/PLAN:    ICD-10-CM   1. History of evisceration of eye  Z98.890     2. Discharge from eye  H57.89     3. Prosthetic eye globe  Z97.0       Kelsey Nelson is a 89 y.o. female who presents with:   Enucleation 10 years ago for blind, painful eye in [place]. Trauma as child.  Prosthesis with recurrent discharge, itching, and lower eyelid erythema due to irregular conjunctival surface. Improves with topical antibiotics and antihistamine No indication for surgery, especially given age Recommend antibiotic ointment nightly and discussion with ocularist regarding possible vaulting of the prosthesis  No follow-ups on file.  Explained the diagnoses, plan, and follow up with the patient and they expressed understanding.  Patient expressed understanding of the importance of proper follow up care.   Case discussed with and patient examined by Dr. Eluterio  I  have reviewed the portions of the history and exam completed by the technician, including review of systems, and agree.   Chauncey Grills, DO Ophthalmology PGY-3     Ophthalmic Meds/Labs Ordered this visit:  No orders of the defined types were placed in this encounter.   New Medications Ordered This Visit  Medications  . erythromycin  (ROMYCIN) 5 mg/gram (0.5 %) ophthalmic ointment    Sig: Apply 0.5 inches to right eye 4 (four) times a day.    Dispense:  3.5 g    Refill:  3          Patient Instructions  Suspect discharge related to irregular conjunctival surface. Does not need surgery.  Recommend routine cleaning with nightly ointment Discuss with ocularist about possible vaulted options to minimize chafing against implant Return as needed.      Abbreviations: M myopia (nearsighted); A astigmatism; H hyperopia (farsighted); P presbyopia; Mrx spectacle prescription;  CTL contact lenses; OD right eye; OS left eye; OU both eyes  XT exotropia; ET esotropia; PEK punctate epithelial keratitis; PEE punctate epithelial erosions; DES dry eye syndrome; MGD meibomian gland dysfunction; ATs artificial tears; PFAT's preservative free artificial tears; NSC nuclear sclerotic cataract; PSC posterior subcapsular cataract; ERM epi-retinal membrane; PVD posterior vitreous detachment; RD retinal detachment; DM diabetes mellitus; DR diabetic retinopathy; NPDR non-proliferative diabetic retinopathy; PDR proliferative diabetic retinopathy; CSME clinically significant macular edema; DME diabetic macular edema; dbh dot blot hemorrhages; CWS cotton wool spot; POAG primary open angle glaucoma; C/D cup-to-disc ratio; HVF humphrey visual field; GVF goldmann visual field; OCT optical coherence tomography; IOP intraocular pressure; BRVO Branch retinal vein occlusion; CRVO central retinal vein occlusion; CRAO central retinal artery occlusion; BRAO branch retinal artery occlusion; RT retinal tear; SB scleral buckle;  PPV pars plana vitrectomy; VH Vitreous hemorrhage; PRP panretinal laser photocoagulation; IVK intravitreal kenalog ; VMT vitreomacular traction; MH Macular hole;  NVD neovascularization of the disc; NVE neovascularization elsewhere; AREDS age related eye disease study; ARMD age related macular degeneration; POAG primary open angle glaucoma; EBMD epithelial/anterior basement membrane dystrophy; ACIOL anterior chamber intraocular lens; IOL intraocular lens; PCIOL posterior chamber intraocular lens; Phaco/IOL phacoemulsification with intraocular lens placement; PRK photorefractive keratectomy; LASIK laser assisted in situ keratomileusis; HTN hypertension; DM diabetes mellitus; COPD chronic obstructive pulmonary disease       [1] Outpatient Encounter Medications as of 09/13/2024  Medication Sig Dispense Refill  . cholecalciferol (VITAMIN D3) 1,000 unit (25 mcg) tablet Take  by mouth.    . donepeziL  (ARICEPT ) 10 mg tablet Take 1 tablet by mouth nightly.    SABRA  erythromycin  (ROMYCIN) 5 mg/gram (0.5 %) ophthalmic ointment Apply 0.5 inches to right eye 4 (four) times a day. 3.5 g 3  . fluticasone  propionate (FLONASE ) 50 mcg/spray nasal spray Administer 1 spray into affected nostril(s).    . magnesium  gluconate 27.5 mg magne- sium (500 mg) tab Take by mouth.    . methenamine  (HIPREX ) 1 gram tablet Take 1 g by mouth.    . microfibrillar collagen (Avitene Flour) powder Apply 1 g topically as needed.    . omalizumab  (Xolair ) 150 mg/mL Syringe Inject 300 mg into the skin every 28 days. 2 mL 5  . omeprazole  (PriLOSEC) 20 mg DR capsule Take 20 mg by mouth daily.    . Premarin  0.625 mg/gram vaginal cream PLACE 1 APPLICATORFUL VAGINALLY EVERY OTHER DAY    . ramipriL  (ALTACE ) 5 mg capsule Take 10 mg by mouth Once Daily.    . triamcinolone  (KENALOG ) 0.1 % cream Apply 1 Application topically.    . white petrolatum-mineral oiL (Systane Nighttime) 94-3 % oint Apply to right eye twice a day as needed 1 Tube 11  . zinc  gluconate 50 mg tab tablet Take 1 tablet by mouth Once Daily.     No facility-administered encounter medications on file as of 09/13/2024.  [2] Allergies Allergen Reactions  . Alendronate GI Intolerance  . Codeine Other (See Comments)    unknown  . Rosuvastatin  Other (See Comments)    Can't recall  [3] Past Medical History: Diagnosis Date  . Abnormal pancreas function test 04/2012   spots on pancreas told to check agin in a year  . Allergy   . GERD (gastroesophageal reflux disease)   . H/O: hysterectomy 1980  . History of CT scan of abdomen 04/2011  . Hypertension   . Nausea 04/21/2012  . Normal colonoscopy 01/2009  . Varicella   [4] Past Surgical History: Procedure Laterality Date  . BREAST SURGERY Bilateral    Procedure: BREAST SURGERY; reduction  . CATARACT EXTRACTION Right about 2004   Procedure: CATARACT EXTRACTION  . ECTROPION REPAIR Right 11/24/2015   Procedure: ROMANA REPAIR / CONJUNCTIVAL CYST REMOVAL;  Surgeon: Lamar Belvie Seashore, MD;  [facility];  Service: Ophthalmology;  Laterality: Right;  . ENUCLEATION Right 05/16/2015   Procedure: ENUCLEATION W/ IMPLANT  . HYSTERECTOMY      Procedure: HYSTERECTOMY  [5] Family History Problem Relation Name Age of Onset  . Cancer Sister    . Cancer Paternal Aunt    . Cancer Paternal Aunt    [6] Social History Tobacco Use  . Smoking status: Never  Substance Use Topics  . Alcohol use: No  . Drug use: No

## 2024-09-16 ENCOUNTER — Ambulatory Visit: Admitting: Adult Health

## 2024-09-22 ENCOUNTER — Ambulatory Visit: Admitting: Urology

## 2024-10-05 ENCOUNTER — Ambulatory Visit (INDEPENDENT_AMBULATORY_CARE_PROVIDER_SITE_OTHER): Admitting: Gastroenterology

## 2024-10-06 ENCOUNTER — Encounter (INDEPENDENT_AMBULATORY_CARE_PROVIDER_SITE_OTHER): Payer: Self-pay | Admitting: Gastroenterology

## 2024-10-18 ENCOUNTER — Emergency Department (HOSPITAL_COMMUNITY)

## 2024-10-18 ENCOUNTER — Ambulatory Visit: Payer: Self-pay

## 2024-10-18 ENCOUNTER — Encounter (HOSPITAL_COMMUNITY): Payer: Self-pay

## 2024-10-18 ENCOUNTER — Other Ambulatory Visit: Payer: Self-pay

## 2024-10-18 ENCOUNTER — Inpatient Hospital Stay (HOSPITAL_COMMUNITY)
Admission: EM | Admit: 2024-10-18 | Discharge: 2024-10-20 | DRG: 291 | Disposition: A | Discharge status: Home / Self Care | Source: Ambulatory Visit | Attending: Family Medicine | Admitting: Family Medicine

## 2024-10-18 DIAGNOSIS — R002 Palpitations: Secondary | ICD-10-CM | POA: Diagnosis not present

## 2024-10-18 DIAGNOSIS — R0602 Shortness of breath: Secondary | ICD-10-CM | POA: Diagnosis not present

## 2024-10-18 DIAGNOSIS — Z79818 Long term (current) use of other agents affecting estrogen receptors and estrogen levels: Secondary | ICD-10-CM | POA: Diagnosis not present

## 2024-10-18 DIAGNOSIS — Z79899 Other long term (current) drug therapy: Secondary | ICD-10-CM | POA: Diagnosis not present

## 2024-10-18 DIAGNOSIS — I110 Hypertensive heart disease with heart failure: Principal | ICD-10-CM | POA: Diagnosis present

## 2024-10-18 DIAGNOSIS — I5031 Acute diastolic (congestive) heart failure: Secondary | ICD-10-CM | POA: Diagnosis present

## 2024-10-18 DIAGNOSIS — J90 Pleural effusion, not elsewhere classified: Secondary | ICD-10-CM | POA: Diagnosis not present

## 2024-10-18 DIAGNOSIS — I493 Ventricular premature depolarization: Secondary | ICD-10-CM | POA: Diagnosis present

## 2024-10-18 DIAGNOSIS — K219 Gastro-esophageal reflux disease without esophagitis: Secondary | ICD-10-CM | POA: Diagnosis present

## 2024-10-18 DIAGNOSIS — Z860100 Personal history of colon polyps, unspecified: Secondary | ICD-10-CM

## 2024-10-18 DIAGNOSIS — H5461 Unqualified visual loss, right eye, normal vision left eye: Secondary | ICD-10-CM | POA: Diagnosis not present

## 2024-10-18 DIAGNOSIS — Z885 Allergy status to narcotic agent status: Secondary | ICD-10-CM

## 2024-10-18 DIAGNOSIS — Z8616 Personal history of COVID-19: Secondary | ICD-10-CM | POA: Diagnosis not present

## 2024-10-18 DIAGNOSIS — E785 Hyperlipidemia, unspecified: Secondary | ICD-10-CM | POA: Diagnosis present

## 2024-10-18 DIAGNOSIS — R918 Other nonspecific abnormal finding of lung field: Secondary | ICD-10-CM | POA: Diagnosis not present

## 2024-10-18 DIAGNOSIS — I10 Essential (primary) hypertension: Secondary | ICD-10-CM | POA: Diagnosis not present

## 2024-10-18 DIAGNOSIS — F0390 Unspecified dementia without behavioral disturbance: Secondary | ICD-10-CM | POA: Diagnosis not present

## 2024-10-18 DIAGNOSIS — J309 Allergic rhinitis, unspecified: Secondary | ICD-10-CM | POA: Diagnosis not present

## 2024-10-18 DIAGNOSIS — Z888 Allergy status to other drugs, medicaments and biological substances status: Secondary | ICD-10-CM | POA: Diagnosis not present

## 2024-10-18 DIAGNOSIS — R0609 Other forms of dyspnea: Secondary | ICD-10-CM | POA: Diagnosis not present

## 2024-10-18 DIAGNOSIS — R252 Cramp and spasm: Secondary | ICD-10-CM | POA: Diagnosis not present

## 2024-10-18 DIAGNOSIS — Z800 Family history of malignant neoplasm of digestive organs: Secondary | ICD-10-CM

## 2024-10-18 DIAGNOSIS — J811 Chronic pulmonary edema: Secondary | ICD-10-CM | POA: Diagnosis not present

## 2024-10-18 DIAGNOSIS — I2489 Other forms of acute ischemic heart disease: Secondary | ICD-10-CM | POA: Diagnosis not present

## 2024-10-18 DIAGNOSIS — I509 Heart failure, unspecified: Secondary | ICD-10-CM | POA: Diagnosis not present

## 2024-10-18 DIAGNOSIS — I700 Atherosclerosis of aorta: Secondary | ICD-10-CM | POA: Diagnosis not present

## 2024-10-18 LAB — CBC WITH DIFFERENTIAL/PLATELET
Abs Immature Granulocytes: 0.02 K/uL (ref 0.00–0.07)
Basophils Absolute: 0 K/uL (ref 0.0–0.1)
Basophils Relative: 1 %
Eosinophils Absolute: 0.1 K/uL (ref 0.0–0.5)
Eosinophils Relative: 1 %
HCT: 38.5 % (ref 36.0–46.0)
Hemoglobin: 12.5 g/dL (ref 12.0–15.0)
Immature Granulocytes: 0 %
Lymphocytes Relative: 12 %
Lymphs Abs: 0.9 K/uL (ref 0.7–4.0)
MCH: 30.9 pg (ref 26.0–34.0)
MCHC: 32.5 g/dL (ref 30.0–36.0)
MCV: 95.3 fL (ref 80.0–100.0)
Monocytes Absolute: 1 K/uL (ref 0.1–1.0)
Monocytes Relative: 14 %
Neutro Abs: 5.1 K/uL (ref 1.7–7.7)
Neutrophils Relative %: 72 %
Platelets: 286 K/uL (ref 150–400)
RBC: 4.04 MIL/uL (ref 3.87–5.11)
RDW: 13.6 % (ref 11.5–15.5)
WBC: 7.2 K/uL (ref 4.0–10.5)
nRBC: 0 % (ref 0.0–0.2)

## 2024-10-18 LAB — COMPREHENSIVE METABOLIC PANEL WITH GFR
ALT: 28 U/L (ref 0–44)
AST: 24 U/L (ref 15–41)
Albumin: 3.8 g/dL (ref 3.5–5.0)
Alkaline Phosphatase: 102 U/L (ref 38–126)
Anion gap: 10 (ref 5–15)
BUN: 14 mg/dL (ref 8–23)
CO2: 25 mmol/L (ref 22–32)
Calcium: 9.2 mg/dL (ref 8.9–10.3)
Chloride: 100 mmol/L (ref 98–111)
Creatinine, Ser: 0.91 mg/dL (ref 0.44–1.00)
GFR, Estimated: >60 mL/min (ref >60)
Glucose, Bld: 103 mg/dL — ABNORMAL HIGH (ref 70–99)
Potassium: 4.3 mmol/L (ref 3.5–5.1)
Sodium: 136 mmol/L (ref 135–145)
Total Bilirubin: 0.5 mg/dL (ref 0.0–1.2)
Total Protein: 6.7 g/dL (ref 6.5–8.1)

## 2024-10-18 LAB — URINALYSIS, ROUTINE W REFLEX MICROSCOPIC
Bacteria, UA: NONE SEEN
Bilirubin Urine: NEGATIVE
Glucose, UA: NEGATIVE mg/dL
Ketones, ur: NEGATIVE mg/dL
Leukocytes,Ua: NEGATIVE
Nitrite: NEGATIVE
Protein, ur: NEGATIVE mg/dL
Specific Gravity, Urine: 1.009 (ref 1.005–1.030)
pH: 6 (ref 5.0–8.0)

## 2024-10-18 LAB — TROPONIN T, HIGH SENSITIVITY
Troponin T High Sensitivity: 26 ng/L — ABNORMAL HIGH (ref 0–19)
Troponin T High Sensitivity: 23 ng/L — ABNORMAL HIGH (ref 0–19)

## 2024-10-18 LAB — PRO BRAIN NATRIURETIC PEPTIDE: Pro Brain Natriuretic Peptide: 1986 pg/mL — ABNORMAL HIGH (ref <300.0)

## 2024-10-18 MED ORDER — FUROSEMIDE 10 MG/ML IJ SOLN
40.0000 mg | Freq: Once | INTRAMUSCULAR | Status: AC
  Administered 2024-10-18: 40 mg via INTRAVENOUS
  Filled 2024-10-18: qty 4

## 2024-10-18 MED ORDER — ONDANSETRON HCL 4 MG PO TABS: 4.0000 mg | ORAL_TABLET | Freq: Four times a day (QID) | ORAL | Status: DC | PRN

## 2024-10-18 MED ORDER — IOHEXOL 350 MG/ML SOLN
75.0000 mL | Freq: Once | INTRAVENOUS | Status: AC | PRN
Start: 2024-10-18 — End: 2024-10-18
  Administered 2024-10-18: 75 mL via INTRAVENOUS

## 2024-10-18 MED ORDER — PANTOPRAZOLE SODIUM 40 MG PO TBEC
40.0000 mg | DELAYED_RELEASE_TABLET | Freq: Every day | ORAL | Status: DC
  Administered 2024-10-18 – 2024-10-19 (×2): 40 mg via ORAL
  Filled 2024-10-18 (×2): qty 1

## 2024-10-18 MED ORDER — FLUTICASONE PROPIONATE 50 MCG/ACT NA SUSP
1.0000 | Freq: Every day | NASAL | Status: DC
  Administered 2024-10-19 – 2024-10-20 (×2): 1 via NASAL
  Filled 2024-10-18: qty 16

## 2024-10-18 MED ORDER — ACETAMINOPHEN 650 MG RE SUPP: 650.0000 mg | Freq: Four times a day (QID) | RECTAL | Status: DC | PRN

## 2024-10-18 MED ORDER — MELATONIN 3 MG PO TABS
3.0000 mg | ORAL_TABLET | Freq: Every evening | ORAL | Status: DC | PRN
Start: 2024-10-18 — End: 2024-10-20
  Administered 2024-10-18 – 2024-10-19 (×2): 3 mg via ORAL
  Filled 2024-10-18 (×2): qty 1

## 2024-10-18 MED ORDER — PREDNISOLONE ACETATE 1 % OP SUSP
1.0000 [drp] | Freq: Four times a day (QID) | OPHTHALMIC | Status: DC
  Administered 2024-10-18 – 2024-10-20 (×6): 1 [drp] via OPHTHALMIC
  Filled 2024-10-18: qty 1

## 2024-10-18 MED ORDER — TROLAMINE SALICYLATE 10 % EX CREA: TOPICAL_CREAM | Freq: Two times a day (BID) | CUTANEOUS | Status: DC | PRN

## 2024-10-18 MED ORDER — FUROSEMIDE 10 MG/ML IJ SOLN
20.0000 mg | Freq: Once | INTRAMUSCULAR | Status: AC
  Administered 2024-10-18: 20 mg via INTRAVENOUS
  Filled 2024-10-18: qty 2

## 2024-10-18 MED ORDER — FUROSEMIDE 10 MG/ML IJ SOLN
40.0000 mg | Freq: Two times a day (BID) | INTRAMUSCULAR | Status: DC
  Administered 2024-10-19: 40 mg via INTRAVENOUS
  Filled 2024-10-18: qty 4

## 2024-10-18 MED ORDER — RAMIPRIL 5 MG PO CAPS
10.0000 mg | ORAL_CAPSULE | Freq: Every day | ORAL | Status: DC
  Administered 2024-10-19 – 2024-10-20 (×2): 10 mg via ORAL
  Filled 2024-10-18 (×2): qty 2

## 2024-10-18 MED ORDER — ONDANSETRON HCL 4 MG/2ML IJ SOLN
4.0000 mg | Freq: Four times a day (QID) | INTRAMUSCULAR | Status: DC | PRN
  Administered 2024-10-20: 4 mg via INTRAVENOUS
  Filled 2024-10-18: qty 2

## 2024-10-18 MED ORDER — ACETAMINOPHEN 325 MG PO TABS
650.0000 mg | ORAL_TABLET | Freq: Four times a day (QID) | ORAL | Status: DC | PRN
  Administered 2024-10-19: 650 mg via ORAL
  Filled 2024-10-18: qty 2

## 2024-10-18 MED ORDER — POLYETHYLENE GLYCOL 3350 17 G PO PACK
17.0000 g | PACK | Freq: Every day | ORAL | Status: DC
  Administered 2024-10-19 – 2024-10-20 (×3): 17 g via ORAL
  Filled 2024-10-18 (×3): qty 1

## 2024-10-18 MED ORDER — ENOXAPARIN SODIUM 40 MG/0.4ML IJ SOSY
40.0000 mg | PREFILLED_SYRINGE | INTRAMUSCULAR | Status: DC
  Administered 2024-10-18 – 2024-10-19 (×2): 40 mg via SUBCUTANEOUS
  Filled 2024-10-18 (×2): qty 0.4

## 2024-10-18 MED ORDER — DONEPEZIL HCL 5 MG PO TABS
10.0000 mg | ORAL_TABLET | Freq: Every day | ORAL | Status: DC
  Administered 2024-10-18 – 2024-10-19 (×2): 10 mg via ORAL
  Filled 2024-10-18 (×2): qty 2

## 2024-10-18 NOTE — Progress Notes (Incomplete)
 HTN, HLD, aortic atherosclerosis (CT scan in 2014)  Was last seen in heart care in 2019 by Dr. Jeffrie for evaluation of chest pain.  At that time, reported chest pressure described as pressure-like sensation in the morning that has worsened over the past 3 days and sometimes travels down the left arm.  Noted walking 3 miles over an hour every morning.  Suspect related to GI issues. Ordered Lexiscan , which showed low risk study, no ischemia/infarction, EF 78%.  Has been lost to follow-up since that time.   Presented to [facility] ED 10/27 with SOB. BNP 1986, TN 26>23, CMP/CBC WNL EKG showed NSR, HR 71, PVC (no acute ischemic changes)  CXR showed mild bilateral interstitial lung markings suggestive of possible atypical pneumonia or noncardiogenic edema, small bilateral pleural effusion, aortic atherosclerosis, thoracolumbar spine levoscoliosis CTA chest showed no PE, C/W pulmonary edema with moderate right and small left pleural effusion, reflux of contrast material into hepatic veins suggesting a degree of right heart dysfunction, aortic atherosclerosis Echo pending Treated with IV Lasix 20 mg X1 then increased to 40 mg.

## 2024-10-18 NOTE — H&P (Signed)
 History and Physical    Patient: Kelsey Nelson FMW:[id number] DOB: 1934-08-06 DOA: 10/18/2024 DOS: the patient was seen and examined on 10/18/2024 PCP: [name], MD   Patient coming from: Home  Chief Complaint:  Chief Complaint  Patient presents with   Shortness of Breath   HPI: Kelsey Nelson is a 89 y.o. female with medical history significant of hypertension, hyperlipidemia, GERD and history of diverticulosis; who presented to the hospital secondary to shortness of breath and dyspnea exertion.  Patient reported symptom has been present for the last week or so and worsening.  She used to be able to walk roughly 3 miles and is now having difficulty to be able to walk back-and-forth to her mailbox. There has not been any fever, chest pain, nausea, vomiting, abdominal pain, dysuria, hematuria, focal weaknesses or sick contacts.  Workup in the ED demonstrating elevated proBNP, chest x-ray with vascular congestion and CT angiogram with negative pulmonary embolism and positive interstitial edema of the lungs.  Case discussed with cardiology service who will see patient in consultation for most likely acute heart failure.  TRH consulted to place patient in the hospital for further evaluation and management.   Review of Systems: As mentioned in the history of present illness. All other systems reviewed and are negative. Past Medical History:  Diagnosis Date   Acute cystitis with hematuria 10/26/2013   Allergic rhinitis 11/08/2017   Blindness of right eye    BMI between 19-24,adult 2010 145 LBS   Diverticula of small intestine Dec 2010   Diverticulosis    [place]   DIVERTICULOSIS, SIGMOID COLON 03/31/2008   Qualifier: Diagnosis of  By: Tita Lipps  Noted on abdominal and pelvic scan in 04/2011.  Extensive colonic diverticulosis , and possible muscular hypertrophy at rectosigmoid junction    Family hx of colon cancer 11/09/2013   Functional constipation 2010   GERD  (gastroesophageal reflux disease)    HTN (hypertension)    Hyperlipemia    Pancreatic cyst 02/05/2013   Renal cyst 05/27/2011   Right eye trauma 1949   Loss of vision resulted at age 54   Schatzki's ring Dec 2010   Skin lesions, generalized 06/19/2015   Tubular adenoma 11/09/2013   Past Surgical History:  Procedure Laterality Date   ABDOMINAL HYSTERECTOMY     BREAST REDUCTION SURGERY  1992 BIL   COLONOSCOPY  DEC 2010   SIMPLE ADENOMAS (6-8), [place] TICS, SML IH   COLONOSCOPY N/A 12/08/2013   Procedure: COLONOSCOPY;  Surgeon: Claudis RAYMOND Rivet, MD;  [facility];  Service: Endoscopy;  Laterality: N/A;  225   COLONOSCOPY N/A 08/13/2024   Procedure: COLONOSCOPY;  Surgeon: Eartha Angelia Sieving, MD;  [facility];  Service: Gastroenterology;  Laterality: N/A;  1:15 pm, asa 3   COLONOSCOPY WITH PROPOFOL  N/A 07/08/2023   Procedure: COLONOSCOPY WITH PROPOFOL ;  Surgeon: Eartha Angelia Sieving, MD;  [facility];  Service: Gastroenterology;  Laterality: N/A;  9:15am; asa 3   ESOPHAGOGASTRODUODENOSCOPY N/A 08/26/2024   Procedure: EGD (ESOPHAGOGASTRODUODENOSCOPY);  Surgeon: Eartha Angelia, Sieving, MD;  [facility];  Service: Gastroenterology;  Laterality: N/A;  10:30 AM, ASA 3   ESOPHAGOGASTRODUODENOSCOPY (EGD) WITH PROPOFOL  N/A 01/13/2024   Procedure: ESOPHAGOGASTRODUODENOSCOPY (EGD) WITH PROPOFOL ;  Surgeon: Eartha Angelia Sieving, MD;  [facility];  Service: Gastroenterology;  Laterality: N/A;  2:30PM;ASA 3   EVISCERATION Right 05/08/2015   Procedure: EVISCERATION WITH IMPLANT RIGHT EYE;  Surgeon: Dow JULIANNA Burke, MD;  Location:  [facility];  Service: Ophthalmology;  Laterality: Right;   EYE SURGERY  RIGHT 2005   EYE SURGERY  right 2016   removed and false eye placed in 08/2015   HEMOSTASIS CLIP PLACEMENT  07/08/2023   Procedure: HEMOSTASIS CLIP PLACEMENT;  Surgeon: Eartha Angelia Sieving, MD;  [facility];  Service:  Gastroenterology;;   POLYPECTOMY  07/08/2023   Procedure: POLYPECTOMY;  Surgeon: Eartha Angelia Sieving, MD;  [facility];  Service: Gastroenterology;;   SUBMUCOSAL LIFTING INJECTION  07/08/2023   Procedure: SUBMUCOSAL LIFTING INJECTION;  Surgeon: Eartha Angelia Sieving, MD;  [facility];  Service: Gastroenterology;;   SUBMUCOSAL TATTOO INJECTION  07/08/2023   Procedure: SUBMUCOSAL TATTOO INJECTION;  Surgeon: Eartha Angelia Sieving, MD;  [facility];  Service: Gastroenterology;;   TOTAL ABDOMINAL HYSTERECTOMY W/ BILATERAL SALPINGOOPHORECTOMY  1985 FIBROIDS   UPPER GASTROINTESTINAL ENDOSCOPY  DEC 2010   Bx-MILD GASTRITIS/DUODENITIS, DUODENAL DIVERTICULA, SCHATZKI'S RING   Social History:  reports that she has never smoked. She has been exposed to tobacco smoke. She has never used smokeless tobacco. She reports that she does not drink alcohol and does not use drugs.  Allergies  Allergen Reactions   Codeine    Crestor  [Rosuvastatin ] Other (See Comments)    Can't recall   Fosamax [Alendronate Sodium]     heartburn    Family History  Problem Relation Age of Onset   Pancreatic cancer Sister    Colon cancer Paternal Aunt    Colon polyps Neg Hx     Prior to Admission medications   Medication Sig Start Date End Date Taking? Authorizing Provider  Cholecalciferol (VITAMIN D3 PO) Take 25 mcg by mouth daily.    [provider]  clobetasol  ointment (TEMOVATE ) 0.05 % Use bid x 2 weeks then 2 x weekly on external tissue of vulva 12/09/23   Signa Nest A, NP  conjugated estrogens  (PREMARIN ) vaginal cream Place 1 applicator vaginally. Every other day    [provider]  diphenhydrAMINE  (BENADRYL  ALLERGY) 25 mg capsule Takes one at bedtime then 3 to 4 hours later takes a 2nd one 02/11/24   [name], MD  donepezil  (ARICEPT ) 10 MG tablet Take 1 tablet (10 mg total) by mouth at bedtime. 06/09/24   [name], MD   estradiol  (ESTRACE ) 0.1 MG/GM vaginal cream Discard plastic applicator. Insert a blueberry size amount (approximately 1 gram) of cream on fingertip inside vagina at bedtime every night for 1 week then every other night. For long term use. 03/31/24   Gerldine Lauraine BROCKS, FNP  fexofenadine  (ALLEGRA  ALLERGY) 180 MG tablet Take 1 tablet (180 mg total) by mouth every morning. 04/21/23   Tobie Arleta SQUIBB, MD  fluticasone  (FLONASE ) 50 MCG/ACT nasal spray Place 1 spray into both nostrils daily. 04/21/23   Tobie Arleta SQUIBB, MD  MAGNESIUM  PO Take by mouth.    [provider]  Multiple Vitamin (MULTIVITAMIN PO) Take by mouth.    [provider]  OVER THE COUNTER MEDICATION Multi Collagen Protein - Patient states that she takes 1 scoop a day.    [provider]  OVER THE COUNTER MEDICATION One daily hylands leg cramp relief    [provider]  OVER THE COUNTER MEDICATION Omega xl joint and muscle support daily    [provider]  polyethylene glycol (MIRALAX ) 17 g packet Take 17 g by mouth daily. 07/14/24   Leath-Warren, Etta PARAS, NP  prednisoLONE acetate (PRED FORTE) 1 % ophthalmic suspension  SMARTSIG:In Eye(s) 08/21/23   [provider]  ramipril  (ALTACE ) 10 MG capsule Take one capsule by mouth once daily for blood pressure 03/25/24   [name], MD  rifaximin  (XIFAXAN ) 550 MG TABS tablet Take 1 tablet (550 mg total) by mouth 3 (three) times daily. 06/24/24   Carlan, Chelsea L, NP  senna-docusate (SENOKOT-S) 8.6-50 MG tablet Take 2 tablets by mouth at bedtime as needed for mild constipation. 07/14/24   Gilmer Etta PARAS, NP    Physical Exam: Vitals:   10/18/24 1645 10/18/24 1730 10/18/24 1739 10/18/24 1758  BP: (!) 141/45 (!) 144/48  (!) 153/59  Pulse: 70 84  87  Resp: 17 14  17   Temp:   98.4 F (36.9 C) 98 F (36.7 C)  TempSrc:    Oral  SpO2: 95% 91%  96%  Weight:    57.6 kg  Height:    5' 5 (1.651 m)   General exam: Alert, awake, oriented  x 3; in no major distress.  Denies chest pain. Respiratory system: Fine crackles at the bases; no using accessory muscles.  Good saturation on room air. Cardiovascular system: Rate controlled, no rubs, no gallops, no JVD on exam. Gastrointestinal system: Abdomen is nondistended, soft and nontender. No organomegaly or masses felt. Normal bowel sounds heard. Central nervous system: Moving 4 limbs spontaneously.  No focal neurological deficits. Extremities: No cyanosis or clubbing. Skin: No petechiae. Psychiatry: Judgement and insight appear normal. Mood & affect appropriate.   Data Reviewed: CBC: White blood cell 7.2, hemoglobin 12.5, platelet count 2 86K Comprehensive metabolic panel: Sodium 136, potassium 4.3, chloride 100, bicarb 25, BUN 14, creatinine 0.91, normal LFTs and GFR >60 High sensitive troponin: 26 >>23 proBNP: 1986.  Assessment and Plan: 1-shortness of breath/dyspnea exertion/acute CHF - 2D echo has been ordered - Mild flat troponin elevation in the setting of demand ischemia - Patient denies chest pain - Continue IV diuresis - Low-sodium diet, daily weights and strict intake and output will be provide - Follow Reds clip measurements - Cardiology service has been consulted and will follow any further recommendations. - Follow clinical response.  2-hypertension - Continue lisinopril - Follow vital signs.  3-history of chronic liver impairment/early dementia - Continue Aricept .  4-increase ocular pressure - Continue Pred forte eyedrops  5-history of constipation - Continue MiraLAX .  6-Allergic rhinitis - Will continue treatment with Flonase .  7-hyperlipidemia - Continue heart healthy diet - Pronation no using a statin.  8-GERD - Continue PPI.    Advance Care Planning:   Code Status: Full Code   Consults: Cardiology service.  Family Communication: No family at bedside.  Severity of Illness: The appropriate patient status for this patient is  INPATIENT. Inpatient status is judged to be reasonable and necessary in order to provide the required intensity of service to ensure the patient's safety. The patient's presenting symptoms, physical exam findings, and initial radiographic and laboratory data in the context of their chronic comorbidities is felt to place them at high risk for further clinical deterioration. Furthermore, it is not anticipated that the patient will be medically stable for discharge from the hospital within 2 midnights of admission.   * I certify that at the point of admission it is my clinical judgment that the patient will require inpatient hospital care spanning beyond 2 midnights from the point of admission due to high intensity of service, high risk for further deterioration and high frequency of surveillance required.*  Author: Eric Nunnery, MD 10/18/2024 6:12 PM  For on call review [email or web address].

## 2024-10-18 NOTE — ED Triage Notes (Signed)
 Pt arrived via POV from home c/o worsening SOB. Pt reports she used to be able to walk 3 miles a day, but now gets short of breath after walking to her mail box. Pt also reports she has been struggling with digestive issues for over the past year as well.

## 2024-10-18 NOTE — ED Notes (Signed)
 Pt ambulated to the bathroom with no assistance.

## 2024-10-18 NOTE — Plan of Care (Signed)
   Problem: Clinical Measurements: Goal: Respiratory complications will improve Outcome: Progressing Goal: Cardiovascular complication will be avoided Outcome: Progressing   Problem: Activity: Goal: Risk for activity intolerance will decrease Outcome: Progressing   Problem: Nutrition: Goal: Adequate nutrition will be maintained Outcome: Progressing

## 2024-10-18 NOTE — ED Notes (Signed)
 patient states she is pooping out more liquid than peeing with the lasix. This nurse asked if she was having diarrhea before and pt states no. PA Rigney notified.

## 2024-10-18 NOTE — ED Notes (Signed)
 Pt also reports she has been having dark black stools. Pt denies being on a blood thinner.

## 2024-10-18 NOTE — Telephone Encounter (Signed)
 FYI Only or Action Required?: Action required by provider: request for appointment.  Patient was last seen in primary care on 06/09/2024 by [name], MD.  Called Nurse Triage reporting No chief complaint on file..  Symptoms began several weeks ago.  Interventions attempted: Nothing.  Symptoms are: rapidly worsening.  Triage Disposition: No disposition on file.  Patient/caregiver understands and will follow disposition?:    Copied from CRM [id number]. Topic: Clinical - Red Word Triage >> Oct 18, 2024  8:23 AM Ivette P wrote: Red Word that prompted transfer to Nurse Triage: very breathless and weak, and wont go to ER, pt only wants to see dr. antonetta. dont know what else to do. Reason for Disposition  [1] MODERATE difficulty breathing (e.g., speaks in phrases, SOB even at rest, pulse 100-120) AND [2] NEW-onset or WORSE than normal  Answer Assessment - Initial Assessment Questions 1. RESPIRATORY STATUS: Describe your breathing? (e.g., wheezing, shortness of breath, unable to speak, severe coughing)      Dyspnea, Constant  2. ONSET: When did this breathing problem begin?       2 Weeks ago  3. PATTERN Does the difficult breathing come and go, or has it been constant since it started?      Constant  4. SEVERITY: How bad is your breathing? (e.g., mild, moderate, severe)      Moderate to Severe  5. RECURRENT SYMPTOM: Have you had difficulty breathing before? If Yes, ask: When was the last time? and What happened that time?      No  6. CARDIAC HISTORY: Do you have any history of heart disease? (e.g., heart attack, angina, bypass surgery, angioplasty)      Hypertension  7. LUNG HISTORY: Do you have any history of lung disease?  (e.g., pulmonary embolus, asthma, emphysema)     Denies  8. CAUSE: What do you think is causing the breathing problem?      Unsure  9. OTHER SYMPTOMS: Do you have any other symptoms? (e.g., chest pain, cough, dizziness,  fever, runny nose)     Weakness, Lethargy  10. O2 SATURATION MONITOR:  Do you use an oxygen saturation monitor (pulse oximeter) at home? If Yes, ask: What is your reading (oxygen level) today? What is your usual oxygen saturation reading? (e.g., 95%)       No, does not own one.  11. PREGNANCY: Is there any chance you are pregnant? When was your last menstrual period?       No and No  12. TRAVEL: Have you traveled out of the country in the last month? (e.g., travel history, exposures)       No, denies  Protocols used: Breathing Difficulty-A-AH

## 2024-10-18 NOTE — ED Provider Notes (Signed)
 [place] EMERGENCY DEPARTMENT AT [facility] Provider Note   CSN: [id number] Arrival date & time: 10/18/24  9072     Patient presents with: Shortness of Breath   Kelsey Nelson is a 89 y.o. female.   Patient is an 89 year old female who presents to the emergency department with a chief complaint of increased exertional dyspnea over the past 2 weeks.  Patient notes that she normally would walk 3 miles a day but has been unable to do so.  She notes that she has had no associated chest pain.  She has had no abdominal pain at this point but is being worked up by gastroenterology secondary to ongoing belching and epigastric pain.  She does note that she has had some darker stools than normal.  She denies any associated dizziness, lightheadedness or syncope.  She denies any nausea, vomiting, diarrhea.  There is been no associated dysuria or hematuria.  She denies any edema to lower extremities.   Shortness of Breath      Prior to Admission medications   Medication Sig Start Date End Date Taking? Authorizing Provider  Cholecalciferol (VITAMIN D3 PO) Take 25 mcg by mouth daily.    [provider]  clobetasol  ointment (TEMOVATE ) 0.05 % Use bid x 2 weeks then 2 x weekly on external tissue of vulva 12/09/23   Signa Nest A, NP  conjugated estrogens  (PREMARIN ) vaginal cream Place 1 applicator vaginally. Every other day    [provider]  diphenhydrAMINE  (BENADRYL  ALLERGY) 25 mg capsule Takes one at bedtime then 3 to 4 hours later takes a 2nd one 02/11/24   [name], MD  donepezil  (ARICEPT ) 10 MG tablet Take 1 tablet (10 mg total) by mouth at bedtime. 06/09/24   [name], MD  estradiol  (ESTRACE ) 0.1 MG/GM vaginal cream Discard plastic applicator. Insert a blueberry size amount (approximately 1 gram) of cream on fingertip inside vagina at bedtime every night for 1 week then every other night. For long term use. 03/31/24   Gerldine Lauraine BROCKS, FNP   fexofenadine  (ALLEGRA  ALLERGY) 180 MG tablet Take 1 tablet (180 mg total) by mouth every morning. 04/21/23   Tobie Arleta SQUIBB, MD  fluticasone  (FLONASE ) 50 MCG/ACT nasal spray Place 1 spray into both nostrils daily. 04/21/23   Tobie Arleta SQUIBB, MD  MAGNESIUM  PO Take by mouth.    [provider]  Multiple Vitamin (MULTIVITAMIN PO) Take by mouth.    [provider]  OVER THE COUNTER MEDICATION Multi Collagen Protein - Patient states that she takes 1 scoop a day.    [provider]  OVER THE COUNTER MEDICATION One daily hylands leg cramp relief    [provider]  OVER THE COUNTER MEDICATION Omega xl joint and muscle support daily    [provider]  polyethylene glycol (MIRALAX ) 17 g packet Take 17 g by mouth daily. 07/14/24   Leath-Warren, Etta JINNY, NP  prednisoLONE acetate (PRED FORTE) 1 % ophthalmic suspension SMARTSIG:In Eye(s) 08/21/23   [provider]  ramipril  (ALTACE ) 10 MG capsule Take one capsule by mouth once daily for blood pressure 03/25/24   [name], MD  rifaximin  (XIFAXAN ) 550 MG TABS tablet Take 1 tablet (550 mg total) by mouth 3 (three) times daily. 06/24/24   Carlan, Chelsea L, NP  senna-docusate (SENOKOT-S) 8.6-50 MG tablet Take 2 tablets by mouth at bedtime as needed for mild constipation. 07/14/24   Leath-Warren, Etta JINNY, NP    Allergies: Codeine, Crestor  [  rosuvastatin ], and Fosamax [alendronate sodium]    Review of Systems  Respiratory:  Positive for shortness of breath.   All other systems reviewed and are negative.   Updated Vital Signs BP (!) 146/41 (BP Location: Right Arm)   Pulse 72   Temp 97.8 F (36.6 C) (Oral)   Resp 16   Ht 5' 5 (1.651 m)   Wt 56.6 kg   SpO2 96%   BMI 20.76 kg/m   Physical Exam Vitals and nursing note reviewed.  Constitutional:      General: She is not in acute distress.    Appearance: Normal appearance. She is not ill-appearing.  HENT:     Head: Normocephalic and  atraumatic.     Nose: Nose normal.     Mouth/Throat:     Mouth: Mucous membranes are moist.  Eyes:     Extraocular Movements: Extraocular movements intact.     Conjunctiva/sclera: Conjunctivae normal.     Pupils: Pupils are equal, round, and reactive to light.  Cardiovascular:     Rate and Rhythm: Normal rate and regular rhythm.     Pulses: Normal pulses.     Heart sounds: Normal heart sounds. No murmur heard.    No gallop.  Pulmonary:     Effort: Pulmonary effort is normal. No tachypnea.     Breath sounds: Normal breath sounds. No decreased breath sounds, wheezing, rhonchi or rales.  Chest:     Chest wall: No tenderness or edema.  Abdominal:     General: Abdomen is flat. Bowel sounds are normal.     Palpations: Abdomen is soft. There is no mass.     Tenderness: There is no abdominal tenderness. There is no guarding.  Musculoskeletal:        General: Normal range of motion.     Cervical back: Normal range of motion and neck supple.     Right lower leg: No edema.     Left lower leg: No edema.  Skin:    General: Skin is warm and dry.     Findings: No rash.  Neurological:     General: No focal deficit present.     Mental Status: She is alert and oriented to person, place, and time. Mental status is at baseline.     Cranial Nerves: No cranial nerve deficit.     Motor: No weakness.  Psychiatric:        Mood and Affect: Mood normal.        Behavior: Behavior normal.        Thought Content: Thought content normal.        Judgment: Judgment normal.     (all labs ordered are listed, but only abnormal results are displayed) Labs Reviewed  CBC WITH DIFFERENTIAL/PLATELET  COMPREHENSIVE METABOLIC PANEL WITH GFR  URINALYSIS, ROUTINE W REFLEX MICROSCOPIC  PRO BRAIN NATRIURETIC PEPTIDE  TROPONIN T, HIGH SENSITIVITY    EKG: EKG Interpretation Date/Time:  Monday October 18 2024 09:43:19 EDT Ventricular Rate:  71 PR Interval:  152 QRS Duration:  99 QT Interval:  406 QTC  Calculation: 442 R Axis:   60  Text Interpretation: Sinus rhythm Premature ventricular complexes Confirmed by Bernard Drivers ([id number]) on 10/18/2024 9:46:01 AM  Radiology: No results found.   Procedures   Medications Ordered in the ED - No data to display  Clinical Course as of 10/18/24 1431  Mon Oct 18, 2024  1316 Pro Brain natriuretic peptide(!) [CR]    Clinical Course User Index [CR] Germaine, [place],  Student-PA                                 Medical Decision Making Amount and/or Complexity of Data Reviewed Labs: ordered. Radiology: ordered.  Risk Prescription drug management. Decision regarding hospitalization.   This patient presents to the ED for concern of knee on exertion, this involves an extensive number of treatment options, and is a complaint that carries with it a high risk of complications and morbidity.  The differential diagnosis includes CHF, ACS, pulmonary embolus, pericarditis, myocarditis, endocarditis, aortic aneurysm or dissection, pneumonia, pneumothorax, hemothorax   Co morbidities that complicate the patient evaluation  Hypertension, hyperlipidemia   Additional history obtained:  Additional history obtained from family External records from outside source obtained and reviewed including medical records   Lab Tests:  I Ordered, and personally interpreted labs.  The pertinent results include: No leukocytosis, no anemia, normal kidney function liver function, normal electrolytes, elevated BNP, stable serial troponins   Imaging Studies ordered:  I ordered imaging studies including CTA chest, chest x-ray I independently visualized and interpreted imaging which showed pulmonary edema and bilateral pleural effusions I agree with the radiologist interpretation   Cardiac Monitoring: / EKG:  The patient was maintained on a cardiac monitor.  I personally viewed and interpreted the cardiac monitored which showed an underlying rhythm of: Normal  sinus rhythm with PVCs, no ST/T wave changes, no ischemic changes, no STEMI   Consultations Obtained:  I requested consultation with the cardiology, Dr. Alvan,  and discussed lab and imaging findings as well as pertinent plan - they recommend: Admission, diuresis, will see the patient in consult   Problem List / ED Course / Critical interventions / Medication management  Patient is doing well at this time and does remain stable.  She is not requiring any oxygen support at this point.  She does appear to have new onset CHF.  Patient has been given dose of Lasix in the emergency department.  Did discuss patient case with Dr. Alvan with cardiology who was in agreement for admission and he will see the patient in consult.  Have discussed patient case with Dr. Ricky with the hospitalist service who has excepted for admission as well.  Patient had no indication for pulmonary embolus.  Do not suspect ACS at this time. I ordered medication including Lasix for CHF Reevaluation of the patient after these medicines showed that the patient improved I have reviewed the patients home medicines and have made adjustments as needed   Social Determinants of Health:  None   Test / Admission - Considered:  Admission     Final diagnoses:  None    ED Discharge Orders     None          Daralene Lonni JONETTA DEVONNA 10/18/24 1433    Bernard Drivers, MD 10/20/24 1429

## 2024-10-19 ENCOUNTER — Inpatient Hospital Stay (HOSPITAL_COMMUNITY)

## 2024-10-19 DIAGNOSIS — I10 Essential (primary) hypertension: Secondary | ICD-10-CM | POA: Diagnosis not present

## 2024-10-19 DIAGNOSIS — R0609 Other forms of dyspnea: Secondary | ICD-10-CM | POA: Diagnosis not present

## 2024-10-19 DIAGNOSIS — J90 Pleural effusion, not elsewhere classified: Secondary | ICD-10-CM

## 2024-10-19 DIAGNOSIS — I509 Heart failure, unspecified: Secondary | ICD-10-CM | POA: Diagnosis not present

## 2024-10-19 DIAGNOSIS — R002 Palpitations: Secondary | ICD-10-CM

## 2024-10-19 DIAGNOSIS — E785 Hyperlipidemia, unspecified: Secondary | ICD-10-CM

## 2024-10-19 LAB — BASIC METABOLIC PANEL WITH GFR
Anion gap: 11 (ref 5–15)
BUN: 16 mg/dL (ref 8–23)
CO2: 27 mmol/L (ref 22–32)
Calcium: 8.7 mg/dL — ABNORMAL LOW (ref 8.9–10.3)
Chloride: 100 mmol/L (ref 98–111)
Creatinine, Ser: 0.93 mg/dL (ref 0.44–1.00)
GFR, Estimated: 59 mL/min — ABNORMAL LOW (ref >60)
Glucose, Bld: 89 mg/dL (ref 70–99)
Potassium: 3.7 mmol/L (ref 3.5–5.1)
Sodium: 138 mmol/L (ref 135–145)

## 2024-10-19 LAB — TSH: TSH: 2.46 u[IU]/mL (ref 0.350–4.500)

## 2024-10-19 LAB — ECHOCARDIOGRAM COMPLETE
AR max vel: 2.15 cm2
AV Area VTI: 2.26 cm2
AV Area mean vel: 2.04 cm2
AV Mean grad: 4 mmHg
AV Peak grad: 8.5 mmHg
Ao pk vel: 1.46 m/s
Area-P 1/2: 3.48 cm2
Calc EF: 58.7 %
Height: 65 in
MV M vel: 3.35 m/s
MV Peak grad: 44.9 mmHg
MV VTI: 2.09 cm2
P 1/2 time: 317 ms
S' Lateral: 2.7 cm
Single Plane A2C EF: 57.8 %
Single Plane A4C EF: 58.8 %
Weight: 1950.63 [oz_av]

## 2024-10-19 LAB — CBC
HCT: 36.2 % (ref 36.0–46.0)
Hemoglobin: 12 g/dL (ref 12.0–15.0)
MCH: 31.3 pg (ref 26.0–34.0)
MCHC: 33.1 g/dL (ref 30.0–36.0)
MCV: 94.3 fL (ref 80.0–100.0)
Platelets: 289 K/uL (ref 150–400)
RBC: 3.84 MIL/uL — ABNORMAL LOW (ref 3.87–5.11)
RDW: 13.4 % (ref 11.5–15.5)
WBC: 6.9 K/uL (ref 4.0–10.5)
nRBC: 0 % (ref 0.0–0.2)

## 2024-10-19 LAB — MAGNESIUM: Magnesium: 2.1 mg/dL (ref 1.7–2.4)

## 2024-10-19 MED ORDER — SENNA 8.6 MG PO TABS
1.0000 | ORAL_TABLET | Freq: Every day | ORAL | Status: DC | PRN
  Administered 2024-10-19: 8.6 mg via ORAL
  Filled 2024-10-19: qty 1

## 2024-10-19 MED ORDER — METHOCARBAMOL 1000 MG/10ML IJ SOLN
750.0000 mg | Freq: Once | INTRAMUSCULAR | Status: AC
  Administered 2024-10-19: 750 mg via INTRAVENOUS
  Filled 2024-10-19: qty 10

## 2024-10-19 MED ORDER — POTASSIUM CHLORIDE CRYS ER 20 MEQ PO TBCR
40.0000 meq | EXTENDED_RELEASE_TABLET | Freq: Once | ORAL | Status: AC
  Administered 2024-10-19: 40 meq via ORAL
  Filled 2024-10-19: qty 2

## 2024-10-19 MED ORDER — MUSCLE RUB 10-15 % EX CREA
TOPICAL_CREAM | Freq: Two times a day (BID) | CUTANEOUS | Status: DC | PRN
  Filled 2024-10-19: qty 85

## 2024-10-19 MED ORDER — METHOCARBAMOL 500 MG PO TABS
500.0000 mg | ORAL_TABLET | Freq: Three times a day (TID) | ORAL | Status: DC | PRN
  Administered 2024-10-20: 500 mg via ORAL
  Filled 2024-10-19: qty 1

## 2024-10-19 MED ORDER — SIMETHICONE 40 MG/0.6ML PO SUSP
40.0000 mg | Freq: Three times a day (TID) | ORAL | Status: DC
  Administered 2024-10-19: 40 mg via ORAL
  Filled 2024-10-19: qty 0.6

## 2024-10-19 MED ORDER — PANTOPRAZOLE SODIUM 40 MG PO TBEC
40.0000 mg | DELAYED_RELEASE_TABLET | Freq: Two times a day (BID) | ORAL | Status: DC
  Administered 2024-10-19 – 2024-10-20 (×2): 40 mg via ORAL
  Filled 2024-10-19 (×2): qty 1

## 2024-10-19 MED ORDER — FUROSEMIDE 10 MG/ML IJ SOLN
20.0000 mg | Freq: Two times a day (BID) | INTRAMUSCULAR | Status: DC
  Administered 2024-10-19 – 2024-10-20 (×2): 20 mg via INTRAVENOUS
  Filled 2024-10-19 (×2): qty 2

## 2024-10-19 MED ORDER — ALUM & MAG HYDROXIDE-SIMETH 200-200-20 MG/5ML PO SUSP
30.0000 mL | Freq: Four times a day (QID) | ORAL | Status: DC | PRN
  Administered 2024-10-19 – 2024-10-20 (×3): 30 mL via ORAL
  Filled 2024-10-19 (×3): qty 30

## 2024-10-19 NOTE — Consult Note (Addendum)
 Cardiology Consultation   Patient ID: Kelsey Nelson MRN: [id number]; DOB: 1934/09/08  Admit date: 10/18/2024 Date of Consult: 10/19/2024  PCP:  Kelsey Rollene BRAVO, MD   [facility] Providers Cardiologist:  None    - NEW    Patient Profile: Kelsey Nelson is a 89 y.o. female with a hx of  HTN, HLD, aortic atherosclerosis (CT scan in 2014), GERD, diverticulosis who is being seen 10/19/2024 for the evaluation of new HF at the request of Dr. Ricky.  History of Present Illness: [name] was last seen in heart care in 2019 by Dr. Jeffrie for evaluation of chest pain.  At that time, reported chest pressure described as pressure-like sensation in the morning that has worsened over the past 3 days and sometimes travels down the left arm.  Noted walking 3 miles over a hour every morning.  Suspect related to GI issues. Ordered Lexiscan , which showed low risk study, no ischemia/infarction, EF 78%.  Has been lost to follow-up since that time.   Presented to [facility] ED 10/27 with SOB/DOE. BNP 1986, TN 26>23, CMP/CBC WNL EKG showed NSR, HR 71, PVC (no acute ischemic changes)  CXR showed mild bilateral interstitial lung markings suggestive of possible atypical pneumonia or noncardiogenic edema, small bilateral pleural effusion, aortic atherosclerosis, thoracolumbar spine levoscoliosis CTA chest showed no PE, C/W pulmonary edema with moderate right and small left pleural effusion, reflux of contrast material into hepatic veins suggesting a degree of right heart dysfunction, aortic atherosclerosis Echo pending Treated with IV Lasix 20 mg X1 then increased to 40 mg. I/O -1200 ml, wt 126 >121 lbs, Cr 0.91 >0.93  On interview, patient reports SOB with minimal exertion, abdominal/upper arm edema, and palpitations for 1 week. Notes good urine and abd/upper arm edema output since IV diuresis. She has been walked to restroom w/o any SOB, however states did not get SOB w/ this short of distance prior.  Denies any prior history of the symptoms.  Patient is usually able to walk 3 miles per day, however more recently SOB and fatigue with walking to mailbox and not able to walk daily anymore.  She eats whatever she wants. Drinks approx 1/2 gallon or more of water  daily. Palpitations described as heart beating hard associated with exertion and resolves with rest.  Denies any chest pain, orthopnea, LE edema, dizziness, syncope.  Denies any recent illnesses.  Denies any tobacco/EtOH/drug use. Patient is not interested in any HLD medication at this time. She prefer to continue with lifestyle modifications and recently ordered unknown OTC regimen for HLD. She confirms she has not followed with cardiology since 2019.    Past Medical History:  Diagnosis Date   Acute cystitis with hematuria 10/26/2013   Allergic rhinitis 11/08/2017   Blindness of right eye    BMI between 19-24,adult 2010 145 LBS   Diverticula of small intestine Dec 2010   Diverticulosis    [place]   DIVERTICULOSIS, SIGMOID COLON 03/31/2008   Qualifier: Diagnosis of  By: Tita Lipps  Noted on abdominal and pelvic scan in 04/2011.  Extensive colonic diverticulosis , and possible muscular hypertrophy at rectosigmoid junction    Family hx of colon cancer 11/09/2013   Functional constipation 2010   GERD (gastroesophageal reflux disease)    HTN (hypertension)    Hyperlipemia    Pancreatic cyst 02/05/2013   Renal cyst 05/27/2011   Right eye trauma 1949   Loss of vision resulted at age 58   Schatzki's ring Dec 2010  Skin lesions, generalized 06/19/2015   Tubular adenoma 11/09/2013    Past Surgical History:  Procedure Laterality Date   ABDOMINAL HYSTERECTOMY     BREAST REDUCTION SURGERY  1992 BIL   COLONOSCOPY  DEC 2010   SIMPLE ADENOMAS (6-8), [place] TICS, SML IH   COLONOSCOPY N/A 12/08/2013   Procedure: COLONOSCOPY;  Surgeon: Kelsey RAYMOND Rivet, MD;  Location[facility];  Service: Endoscopy;  Laterality: N/A;  225   COLONOSCOPY N/A 08/13/2024    Procedure: COLONOSCOPY;  Surgeon: Kelsey Angelia Sieving, MD;  Location: [facility];  Service: Gastroenterology;  Laterality: N/A;  1:15 pm, asa 3   COLONOSCOPY WITH PROPOFOL  N/A 07/08/2023   Procedure: COLONOSCOPY WITH PROPOFOL ;  Surgeon: Kelsey Angelia Sieving, MD;  [facility];  Service: Gastroenterology;  Laterality: N/A;  9:15am; asa 3   ESOPHAGOGASTRODUODENOSCOPY N/A 08/26/2024   Procedure: EGD (ESOPHAGOGASTRODUODENOSCOPY);  Surgeon: Kelsey Angelia, Sieving, MD;  Location[facility];  Service: Gastroenterology;  Laterality: N/A;  10:30 AM, ASA 3   ESOPHAGOGASTRODUODENOSCOPY (EGD) WITH PROPOFOL  N/A 01/13/2024   Procedure: ESOPHAGOGASTRODUODENOSCOPY (EGD) WITH PROPOFOL ;  Surgeon: Kelsey Angelia Sieving, MD;  [facility];  Service: Gastroenterology;  Laterality: N/A;  2:30PM;ASA 3   EVISCERATION Right 05/08/2015   Procedure: EVISCERATION WITH IMPLANT RIGHT EYE;  Surgeon: Kelsey JULIANNA Burke, MD;  Location: [facility];  Service: Ophthalmology;  Laterality: Right;   EYE SURGERY  RIGHT 2005   EYE SURGERY  right 2016   removed and false eye placed in 08/2015   HEMOSTASIS CLIP PLACEMENT  07/08/2023   Procedure: HEMOSTASIS CLIP PLACEMENT;  Surgeon: Kelsey Angelia Sieving, MD;  [facility];  Service: Gastroenterology;;   POLYPECTOMY  07/08/2023   Procedure: POLYPECTOMY;  Surgeon: Kelsey Angelia Sieving, MD;  [facility];  Service: Gastroenterology;;   SUBMUCOSAL LIFTING INJECTION  07/08/2023   Procedure: SUBMUCOSAL LIFTING INJECTION;  Surgeon: Kelsey Angelia Sieving, MD;  [facility];  Service: Gastroenterology;;   SUBMUCOSAL TATTOO INJECTION  07/08/2023   Procedure: SUBMUCOSAL TATTOO INJECTION;  Surgeon: Kelsey Angelia Sieving, MD;  [facility];  Service: Gastroenterology;;   TOTAL ABDOMINAL HYSTERECTOMY W/ BILATERAL SALPINGOOPHORECTOMY  1985 FIBROIDS   UPPER GASTROINTESTINAL ENDOSCOPY  DEC 2010   Bx-MILD  GASTRITIS/DUODENITIS, DUODENAL DIVERTICULA, SCHATZKI'S RING     Home Medications:  Prior to Admission medications   Medication Sig Start Date End Date Taking? Authorizing Provider  Cholecalciferol (VITAMIN D3 PO) Take 25 mcg by mouth daily.    [provider]  clobetasol  ointment (TEMOVATE ) 0.05 % Use bid x 2 weeks then 2 x weekly on external tissue of vulva 12/09/23   Signa Nest A, NP  conjugated estrogens  (PREMARIN ) vaginal cream Place 1 applicator vaginally. Every other day    [provider]  diphenhydrAMINE  (BENADRYL  ALLERGY) 25 mg capsule Takes one at bedtime then 3 to 4 hours later takes a 2nd one 02/11/24   Kelsey Rollene BRAVO, MD  donepezil  (ARICEPT ) 10 MG tablet Take 1 tablet (10 mg total) by mouth at bedtime. 06/09/24   Kelsey Rollene BRAVO, MD  estradiol  (ESTRACE ) 0.1 MG/GM vaginal cream Discard plastic applicator. Insert a blueberry size amount (approximately 1 gram) of cream on fingertip inside vagina at bedtime every night for 1 week then every other night. For long term use. 03/31/24   Gerldine Lauraine BROCKS, FNP  fexofenadine  (ALLEGRA  ALLERGY) 180 MG tablet Take 1 tablet (180 mg total) by mouth every morning. 04/21/23   Tobie Arleta SQUIBB, MD  fluticasone  (FLONASE ) 50 MCG/ACT nasal spray Place 1 spray into both nostrils daily. 04/21/23   Tobie Arleta SQUIBB, MD  MAGNESIUM  PO Take by mouth.    [provider]  Multiple Vitamin (MULTIVITAMIN PO) Take by mouth.    [provider]  OVER THE COUNTER MEDICATION Multi Collagen Protein - Patient states that she takes 1 scoop a day.    [provider]  OVER THE COUNTER MEDICATION One daily hylands leg cramp relief    [provider]  OVER THE COUNTER MEDICATION Omega xl joint and muscle support daily    [provider]  polyethylene glycol (MIRALAX ) 17 g packet Take 17 g by mouth daily. 07/14/24   Leath-Warren, Etta PARAS, NP  prednisoLONE acetate (PRED FORTE) 1 % ophthalmic suspension  SMARTSIG:In Eye(s) 08/21/23   [provider]  ramipril  (ALTACE ) 10 MG capsule Take one capsule by mouth once daily for blood pressure 03/25/24   Kelsey Rollene BRAVO, MD  senna-docusate (SENOKOT-S) 8.6-50 MG tablet Take 2 tablets by mouth at bedtime as needed for mild constipation. 07/14/24   Leath-Warren, Etta PARAS, NP    Scheduled Meds:  donepezil   10 mg Oral QHS   enoxaparin (LOVENOX) injection  40 mg Subcutaneous Q24H   fluticasone   1 spray Each Nare Daily   furosemide  40 mg Intravenous Q12H   pantoprazole   40 mg Oral Daily   polyethylene glycol  17 g Oral Daily   prednisoLONE acetate  1 drop Both Eyes QID   ramipril   10 mg Oral Daily   Continuous Infusions:  PRN Meds: acetaminophen  **OR** acetaminophen , melatonin, ondansetron  **OR** ondansetron  (ZOFRAN ) IV, trolamine salicylate  Allergies:    Allergies  Allergen Reactions   Codeine    Crestor  [Rosuvastatin ] Other (See Comments)    Can't recall   Fosamax [Alendronate Sodium]     heartburn    Social History:   Social History   Socioeconomic History   Marital status: Married    Spouse name: [name]    Number of children: 3   Years of education: 12   Highest education level: 12th grade  Occupational History   Occupation: retired   Tobacco Use   Smoking status: Never    Passive exposure: Past   Smokeless tobacco: Never  Vaping Use   Vaping status: Never Used  Substance and Sexual Activity   Alcohol use: No    Alcohol/week: 0.0 standard drinks of alcohol   Drug use: No   Sexual activity: Not Currently    Birth control/protection: Post-menopausal, Surgical    Comment: hysterectomy  Other Topics Concern   Not on file  Social History Narrative   MARRIED TO DAVID Zeiter. 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  All  other ROS reviewed and negative.     Physical Exam/Data: Vitals:   10/18/24 2153 10/19/24 0225 10/19/24 0229 10/19/24 0607  BP: (!) 152/50 (!) 104/93  (!) 139/40  Pulse: 79 77  72  Resp: 18 18  18   Temp: 98.3 F (36.8 C) 99 F (37.2 C)  98.2 F (36.8 C)  TempSrc:  Oral  Oral  SpO2: 93% 91%  94%  Weight:   55.3 kg   Height:        Intake/Output  Summary (Last 24 hours) at 10/19/2024 0737 Last data filed at 10/18/2024 2100 Gross per 24 hour  Intake --  Output 1200 ml  Net -1200 ml      10/19/2024    2:29 AM 10/18/2024    5:58 PM 10/18/2024    9:34 AM  Last 3 Weights  Weight (lbs) 121 lb 14.6 oz 126 lb 15.8 oz 124 lb 12.5 oz  Weight (kg) 55.3 kg 57.6 kg 56.6 kg     Body mass index is 20.29 kg/m.  General:  Well nourished, well developed, in no acute distress HEENT: normal Neck: no JVD Vascular: No carotid bruits; Distal pulses 2+ bilaterally Cardiac:  normal S1, S2; RRR; no murmur  Lungs:  crackle in lung bases Abd: soft, nontender, no hepatomegaly  Ext: no edema Musculoskeletal:  No deformities, BUE and BLE strength normal and equal Skin: warm and dry  Neuro:  CNs 2-12 intact, no focal abnormalities noted Psych:  Normal affect   EKG:  The EKG was personally reviewed and demonstrates:  NSR, HR 71, PVC (no acute ischemic changes)  Telemetry:  Telemetry was personally reviewed and demonstrates:  not on tele.   Relevant CV Studies: ECHO pending   7 day monitor 2016 Event monitor reviewed. Sinus rhythm present throughout without arrhythmias or pauses.   Lexiscan  2019 Nuclear stress EF: 78%. The left ventricular ejection fraction is hyperdynamic (>65%). There was no ST segment deviation noted during stress. The study is normal. There is no evidence of ischemia. There is no evidence of previous infarction. This is a low risk study.  Laboratory Data: High Sensitivity Troponin:  No results for input(s): TROPONINIHS in the last 720 hours.   Chemistry Recent  Labs  Lab 10/18/24 0951 10/19/24 0450  NA 136 138  K 4.3 3.7  CL 100 100  CO2 25 27  GLUCOSE 103* 89  BUN 14 16  CREATININE 0.91 0.93  CALCIUM  9.2 8.7*  MG  --  2.1  GFRNONAA >60 59*  ANIONGAP 10 11    Recent Labs  Lab 10/18/24 0951  PROT 6.7  ALBUMIN 3.8  AST 24  ALT 28  ALKPHOS 102  BILITOT 0.5   Lipids No results for input(s): CHOL, TRIG, HDL, LABVLDL, LDLCALC, CHOLHDL in the last 168 hours.  Hematology Recent Labs  Lab 10/18/24 0951 10/19/24 0450  WBC 7.2 6.9  RBC 4.04 3.84*  HGB 12.5 12.0  HCT 38.5 36.2  MCV 95.3 94.3  MCH 30.9 31.3  MCHC 32.5 33.1  RDW 13.6 13.4  PLT 286 289   Thyroid   Recent Labs  Lab 10/19/24 0450  TSH 2.460    BNP Recent Labs  Lab 10/18/24 0951  PROBNP 1,986.0*    DDimer No results for input(s): DDIMER in the last 168 hours.  Radiology/Studies:  CT Angio Chest PE W/Cm &/Or Wo Cm Result Date: 10/18/2024 CLINICAL DATA:  Dyspnea on exertion EXAM: CT ANGIOGRAPHY CHEST WITH CONTRAST TECHNIQUE: Multidetector CT imaging of the chest was performed using the standard protocol during bolus administration of intravenous contrast. Multiplanar CT image reconstructions and MIPs were obtained to evaluate the vascular anatomy. RADIATION DOSE REDUCTION: This exam was performed according to the departmental dose-optimization program which includes automated exposure control, adjustment of the mA and/or kV according to patient size and/or use of iterative reconstruction technique. CONTRAST:  75mL OMNIPAQUE  IOHEXOL  350 MG/ML SOLN COMPARISON:  Same day chest radiograph FINDINGS: Cardiovascular: The study is high quality for the evaluation of pulmonary embolism. There are no filling  defects in the central, lobar, segmental or subsegmental pulmonary artery branches to suggest acute pulmonary embolism. Great vessels are normal in course and caliber. Mild left atrial enlargement. No significant pericardial fluid/thickening. Aortic  atherosclerosis. Reflux of contrast material into the hepatic veins, suggesting a degree of right heart dysfunction. Mediastinum/Nodes: Imaged thyroid  gland without nodules meeting criteria for imaging follow-up by size. Normal esophagus. No pathologically enlarged axillary, supraclavicular, mediastinal, or hilar lymph nodes. Lungs/Pleura: The central airways are patent. Diffuse ground-glass opacities and interlobular septal thickening. No focal consolidation. No pneumothorax. Moderate right and small left pleural effusions. Upper abdomen: Normal. Musculoskeletal: No acute or abnormal lytic or blastic osseous lesions. Multilevel degenerative changes of the thoracic spine. Bifid left fourth rib. Review of the MIP images confirms the above findings. IMPRESSION: 1. No evidence of pulmonary embolism. 2. Findings of pulmonary edema and moderate right and small left pleural effusions. 3. Reflux of contrast material into the hepatic veins, suggesting a degree of right heart dysfunction. 4.  Aortic Atherosclerosis (ICD10-I70.0). Electronically Signed   By: Limin  Xu M.D.   On: 10/18/2024 14:03   DG Chest Port 1 View Result Date: 10/18/2024 EXAM: 1 VIEW(S) XRAY OF THE CHEST 10/18/2024 09:51:00 AM COMPARISON: 06/10/2022 CLINICAL HISTORY: Dyspnea, worsening shortness of breath, and history of hypertension. FINDINGS: LUNGS AND PLEURA: Mild bilateral interstitial lung markings, potentially a manifestation of atypical pneumonia or noncardiogenic edema. Small bilateral pleural effusions. No focal pulmonary opacity. No pneumothorax. HEART AND MEDIASTINUM: Aortic atherosclerosis. No acute abnormality of the cardiac and mediastinal silhouettes. BONES AND SOFT TISSUES: Thoracolumbar spine levoscoliosis. No acute osseous abnormality. IMPRESSION: 1. Mild bilateral interstitial lung markings, possibly atypical pneumonia or noncardiogenic edema. 2. Small bilateral pleural effusions. 3. Aortic atherosclerosis. 4. Thoracolumbar spine  levoscoliosis. Electronically signed by: Ryan Salvage MD 10/18/2024 10:28 AM EDT RP Workstation: [id number]     Assessment and Plan: Possibly acute CHF Pleural effusion Presented with SOB/DOE and abdominal/upper arm edema. Notes good urine and abd/upper arm edema output since IV diuresis. She is not able to assess any change with SOB but denies any SOB w/ walking to restroom.  BNP 1986.  CXR and CTA suggestive of HF. TN 26>23.  Denies any angina.  EKG without ischemic changes.  Most likely secondary to demand ischemia. Echo pending. Treated with IV Lasix 20 mg X1 then increased to 40 mg x2. I/O -1200 ml, wt 126 >121 lbs, Cr 0.91 >0.93. Crackles in lung but otherwise appear euvolemic. REDS Pending. Continue IV lasix 40 mg BID Can reassess based on ECHO and REDS.  Encourage <2 g of salt daily.    Palpitations Presents with palpitations x1 week described as heart beating hard associated with exertion and resolves with rest Order Telemetry  Can consider outpatient monitor  HTN BP 139/40 Continue Ramipril  10 mg and IV lasix   HLD  LDL 174 in 12/2023 and LFT WNL 09/2024 Patient is not interested in any HLD medication at this time. She prefer to continue with lifestyle modifications and recently ordered unknown OTC regimen for HLD.   Risk Assessment/Risk Scores:   [place]  Heart Association (NYHA) Functional Class NYHA Class III       For questions or updates, please contact [facility] Please consult [email or web address] for contact info under      Signed, Lorette CINDERELLA Kapur, PA-C  10/19/2024 7:37 AM   Attending Note  Patient seen and discussed with PA Kapur, I agree with her documentation. 89 yo female history of HTN, HLD, admitted with SOB/DOE.  She used to be able to walk roughly 3 miles and is now having difficulty to be able to walk back-and-forth to her mailbox.   WBC 7.2 Hgb 12.5 Plt 286 K 4.3 BUN 14 Cr 0.91 ProBNP 1986  Trop 26-->23 EKG NSR, no acute  ischemic changes CXR: mild interstitial marking, atypical pneumonia vs edema CT PE: no PE< +pulm edema, mod right and small left pleural effusion.    1.Acute heart failure, unknown type - presented with SOB/DOE - CXR/CT PE with signs of pulmonary edema. proBNP 1986 - received IV lasix 20mg  x 1 and 40mg  x 1 yesterday. I/Os are incomplete. Wt 125-->121 lbs. Renal function is stable.  - echo pending -of note on ACEi, if ARNI is required will need washout period.  - reports chronic muscle cramps worst last night, perhaps exacerbated by diuretic. Lower lasix to 20mg  bid    2.Palpitations -will add telemetry    Dorn Ross MD

## 2024-10-19 NOTE — Plan of Care (Signed)

## 2024-10-19 NOTE — TOC CM/SW Note (Signed)
 Transition of Care [facility]) - Inpatient Brief Assessment   Patient Details  Name: Kelsey Nelson MRN: [id number] Date of Birth: 12-05-1934  [facility] CM/SW Contact:    Lucie Lunger, LCSWA Phone Number: 10/19/2024, 8:43 AM   Clinical Narrative: Transition of Care Department [facility]) has reviewed patient and no TOC needs have been identified at this time. We will continue to monitor patient advancement through interdiciplinary progression rounds. If new patient transition needs arise, please place a TOC consult.  Transition of Care Asessment: Insurance and Status: Insurance coverage has been reviewed Patient has primary care physician: Yes Home environment has been reviewed: From home Prior level of function:: Independent Prior/Current Home Services: No current home services Social Drivers of Health Review: SDOH reviewed no interventions necessary Readmission risk has been reviewed: Yes Transition of care needs: no transition of care needs at this time

## 2024-10-19 NOTE — Progress Notes (Signed)
 Per Guidelines for [place] (body mass index of 22 to 36) unable to obtain [place] value due to BMI less than 22. Patient BMI 20.29.

## 2024-10-19 NOTE — Progress Notes (Signed)
 Progress Note   Patient: Kelsey Nelson FMW:[id number] DOB: 05/25/1934 DOA: 10/18/2024     1 DOS: the patient was seen and examined on 10/19/2024   Brief hospital admission narrative: Kelsey Nelson is a 89 y.o. female with medical history significant of hypertension, hyperlipidemia, GERD and history of diverticulosis; who presented to the hospital secondary to shortness of breath and dyspnea exertion.  Patient reported symptom has been present for the last week or so and worsening.  She used to be able to walk roughly 3 miles and is now having difficulty to be able to walk back-and-forth to her mailbox. There has not been any fever, chest pain, nausea, vomiting, abdominal pain, dysuria, hematuria, focal weaknesses or sick contacts.   Workup in the ED demonstrating elevated proBNP, chest x-ray with vascular congestion and CT angiogram with negative pulmonary embolism and positive interstitial edema of the lungs.   Case discussed with cardiology service who will see patient in consultation for most likely acute heart failure.  TRH consulted to place patient in the hospital for further evaluation and management.  Assessment and plan 1-shortness of breath/increased on exertion/presumed acute CHF - Follow-up 2D echo - Continue IV diuresis (dose adjusted) - Follow daily weights, strict I's and O's and low-sodium diet. - Patient reported increased use of pickle juice and also increase sodium intake/salt after pretty much losing taste from COVID infection multiple month ago. -Cardiology service has been consulted and will follow any further recommendations.  2-hypertension - Continue lisinopril for now - Follow vital signs. - If Entresto needed patient will require washout period.  3-history of cognitive impairment/early dementia - Continue Aricept .  4-hyperlipidemia - Continue statin.  5-increased ocular pressure - Continue Pred forte eyedrops.  6-allergy denied - Continue  MiraLAX .  7-GERD/belching - Continue PPI - As needed muscle relaxants (Robaxin) and simethicone  along with Maalox will be provided.   Subjective:  No chest pain, no nausea, no vomiting.  Patient reports some improvement in her overall shortness of breath sensation.  No significant swelling in her legs currently appreciated.  Physical Exam: Vitals:   10/19/24 0225 10/19/24 0229 10/19/24 0607 10/19/24 1318  BP: (!) 104/93  (!) 139/40 (!) 139/40  Pulse: 77  72 76  Resp: 18  18 18   Temp: 99 F (37.2 C)  98.2 F (36.8 C) 97.7 F (36.5 C)  TempSrc: Oral  Oral Oral  SpO2: 91%  94% 96%  Weight:  55.3 kg    Height:       General exam: Alert, awake, in no acute distress and following commands appropriately.  Denies chest pain. Respiratory system: Decreased breath sounds at the bases; no using accessory muscles and no wheezing. Cardiovascular system: Rate controlled, no rubs, no gallops, no JVD. Gastrointestinal system: Abdomen is nondistended, soft and nontender. No organomegaly or masses felt. Normal bowel sounds heard. Central nervous system: Moving 4 limbs spontaneously.  No focal neurological deficits. Extremities: No cyanosis or clubbing; trace edema appreciated bilaterally. Skin: No petechiae. Psychiatry: Judgement and insight appear normal.  Flat affect appreciated on exam.   Data Reviewed: TSH: 2.460 Magnesium : 2.1 Basic metabolic panel: Sodium 138, potassium 3.7, chloride 100, bicarb 27, BUN 16, creatinine 0.93 and GFR 59 CBC: WBC 6.9, hemoglobin 12.0 and platelet count 29K  Family Communication: No family at bedside.  Disposition: Status is: Inpatient Remains inpatient appropriate because: Continue IV diuresis.  Anticipating discharge back home once medically stable.  Time spent: 50 minutes  Author: Eric Nunnery, MD 10/19/2024 6:16 PM  For on call review [email or web address].

## 2024-10-19 NOTE — Progress Notes (Signed)
 Mobility Specialist Progress Note:    10/19/24 1430  Mobility  Activity Ambulated with assistance  Level of Assistance Independent  Assistive Device None  Distance Ambulated (ft) 750 ft  Range of Motion/Exercises Active;All extremities  Activity Response Tolerated well  Mobility Referral Yes  Mobility visit 1 Mobility  Mobility Specialist Start Time (ACUTE ONLY) 1430  Mobility Specialist Stop Time (ACUTE ONLY) 1450  Mobility Specialist Time Calculation (min) (ACUTE ONLY) 20 min   Pt received in bed, agreeable to mobility. Independently able to stand and ambulate with no AD. Tolerated well, c/o mild lightheadedness. Left pt standing in room, all needs met.  Terrisa Curfman Mobility Specialist Please contact via Special Educational Needs Teacher or  Rehab office at [phone]

## 2024-10-20 DIAGNOSIS — I5031 Acute diastolic (congestive) heart failure: Secondary | ICD-10-CM

## 2024-10-20 LAB — BASIC METABOLIC PANEL WITH GFR
Anion gap: 9 (ref 5–15)
BUN: 18 mg/dL (ref 8–23)
CO2: 29 mmol/L (ref 22–32)
Calcium: 8.9 mg/dL (ref 8.9–10.3)
Chloride: 98 mmol/L (ref 98–111)
Creatinine, Ser: 1.14 mg/dL — ABNORMAL HIGH (ref 0.44–1.00)
GFR, Estimated: 46 mL/min — ABNORMAL LOW (ref >60)
Glucose, Bld: 98 mg/dL (ref 70–99)
Potassium: 3.8 mmol/L (ref 3.5–5.1)
Sodium: 136 mmol/L (ref 135–145)

## 2024-10-20 LAB — MAGNESIUM: Magnesium: 2.3 mg/dL (ref 1.7–2.4)

## 2024-10-20 MED ORDER — MAGNESIUM CITRATE PO SOLN
1.0000 | Freq: Once | ORAL | Status: AC
  Administered 2024-10-20: 1 via ORAL
  Filled 2024-10-20: qty 296

## 2024-10-20 MED ORDER — FUROSEMIDE 20 MG PO TABS: 20.0000 mg | ORAL_TABLET | Freq: Every day | ORAL | Status: DC

## 2024-10-20 MED ORDER — METHOCARBAMOL 500 MG PO TABS: 500.0000 mg | ORAL_TABLET | Freq: Three times a day (TID) | ORAL | 1 refills | Status: AC | PRN

## 2024-10-20 MED ORDER — POTASSIUM CHLORIDE CRYS ER 20 MEQ PO TBCR
40.0000 meq | EXTENDED_RELEASE_TABLET | Freq: Once | ORAL | Status: AC
  Administered 2024-10-20: 40 meq via ORAL
  Filled 2024-10-20: qty 2

## 2024-10-20 MED ORDER — SENNOSIDES 8.8 MG/5ML PO SYRP
10.0000 mL | ORAL_SOLUTION | Freq: Once | ORAL | Status: AC
  Administered 2024-10-20: 10 mL via ORAL
  Filled 2024-10-20: qty 10

## 2024-10-20 MED ORDER — EMPAGLIFLOZIN 10 MG PO TABS: 10.0000 mg | ORAL_TABLET | Freq: Every day | ORAL | 2 refills | Status: DC

## 2024-10-20 MED ORDER — ENOXAPARIN SODIUM 30 MG/0.3ML IJ SOSY: 30.0000 mg | PREFILLED_SYRINGE | INTRAMUSCULAR | Status: DC

## 2024-10-20 MED ORDER — FUROSEMIDE 20 MG PO TABS: 20.0000 mg | ORAL_TABLET | Freq: Every day | ORAL | 2 refills | Status: DC

## 2024-10-20 MED ORDER — EMPAGLIFLOZIN 10 MG PO TABS
10.0000 mg | ORAL_TABLET | Freq: Every day | ORAL | Status: DC
  Administered 2024-10-20: 10 mg via ORAL
  Filled 2024-10-20: qty 1

## 2024-10-20 NOTE — Discharge Summary (Signed)
 Physician Discharge Summary   Patient: Kelsey Nelson MRN: [id number] DOB: 08-15-1934  Admit date:     10/18/2024  Discharge date:   Discharge Physician: Kelsey Nelson   PCP: Kelsey Rollene BRAVO, MD   Recommendations at discharge:    Follow-up with PCP in 1 week Follow-up with cardiologist in 1-2 weeks Continue current recommended medication, daily weight, Current medications of substance use per PCP and cardiology   Kelsey Nelson is a 89 y.o. female with medical history significant of hypertension, hyperlipidemia, GERD and history of diverticulosis; who presented to the hospital secondary to shortness of breath and dyspnea exertion.  Patient reported symptom has been present for the last week or so and worsening.  She used to be able to walk roughly 3 miles and is now having difficulty to be able to walk back-and-forth to her mailbox. There has not been any fever, chest pain, nausea, vomiting, abdominal pain, dysuria, hematuria, focal weaknesses or sick contacts.   Workup in the ED demonstrating elevated proBNP, chest x-ray with vascular congestion and CT angiogram with negative pulmonary embolism and positive interstitial edema of the lungs.   Case discussed with cardiology service who will see patient in consultation for most likely acute heart failure.  TRH consulted to place patient in the hospital for further evaluation and management.    shortness of breath/increased on exertion/presumed acute CHF - HFpEF - LVEF 55-60%, no WMAs, indet diastolic function, normal RV function  - Continue IV diuresis (dose adjusted) >>  per cardiology she was changed to 20 mg daily - Patient I's and O's and daily weight was monitored, continue monitoring daily weight  I Xarelto 126 >>.  141 pound - Patient reported increased use of pickle juice and also increase sodium intake/salt after pretty much losing taste from COVID infection multiple month ago. - Cardiology was consulted, per Kelsey Nelson  to continue 20 mg daily, started Jardiance 10 mg daily   2-hypertension - Continue lisinopril for now - Benefit from Entresto-follow-up with cardiology as an outpatient    3-history of cognitive impairment/early dementia - Continue Aricept .   4-hyperlipidemia - Continue statin.   5-increased ocular pressure - Continue Pred forte eyedrops.   6-allergy denied - Continue MiraLAX .   7-GERD/belching - Continue PPI - As needed muscle relaxants (Robaxin) and simethicone  along with Maalox will be provided.           Consultants: Cardiology Procedures performed: See department Disposition: Home Diet recommendation:  Discharge Diet Orders (From admission, onward)     Start     Ordered   10/20/24 0000  Diet - low sodium heart healthy        10/20/24 1020           Cardiac diet DISCHARGE MEDICATION: Allergies as of 10/20/2024       Reactions   Codeine    Crestor  [rosuvastatin ] Other (See Comments)   Can't recall   Fosamax [alendronate Sodium]    heartburn        Medication List     TAKE these medications    Benadryl  Allergy 25 mg capsule Generic drug: diphenhydrAMINE  Takes one at bedtime then 3 to 4 hours later takes a 2nd one   donepezil  10 MG tablet Commonly known as: ARICEPT  Take 1 tablet (10 mg total) by mouth at bedtime.   empagliflozin 10 MG Tabs tablet Commonly known as: JARDIANCE Take 1 tablet (10 mg total) by mouth daily. Start taking on: October 21, 2024   erythromycin   ophthalmic ointment Apply 0.5 inches to eye 4 (four) times daily.   estradiol  0.1 MG/GM vaginal cream Commonly known as: ESTRACE  Discard plastic applicator. Insert a blueberry size amount (approximately 1 gram) of cream on fingertip inside vagina at bedtime every night for 1 week then every other night. For long term use.   fexofenadine  180 MG tablet Commonly known as: Allegra  Allergy Take 1 tablet (180 mg total) by mouth every morning.   fluticasone  50 MCG/ACT nasal  spray Commonly known as: FLONASE  Place 1 spray into both nostrils daily.   furosemide 20 MG tablet Commonly known as: LASIX Take 1 tablet (20 mg total) by mouth daily. Start taking on: October 21, 2024   MAGNESIUM  PO Take by mouth.   methocarbamol 500 MG tablet Commonly known as: ROBAXIN Take 1 tablet (500 mg total) by mouth every 8 (eight) hours as needed for up to 8 days for muscle spasms.   MULTIVITAMIN PO Take by mouth.   OMEGA 3 FISH OIL PO Take 1 capsule by mouth daily.   OVER THE COUNTER MEDICATION Multi Collagen Protein - Patient states that she takes 1 scoop a day.   OVER THE COUNTER MEDICATION One daily hylands leg cramp relief   OVER THE COUNTER MEDICATION Omega xl joint and muscle support daily   polyethylene glycol 17 g packet Commonly known as: MiraLax  Take 17 g by mouth daily.   prednisoLONE acetate 1 % ophthalmic suspension Commonly known as: PRED FORTE SMARTSIG:In Eye(s)   ramipril  10 MG capsule Commonly known as: ALTACE  Take one capsule by mouth once daily for blood pressure   senna-docusate 8.6-50 MG tablet Commonly known as: Senokot-S Take 2 tablets by mouth at bedtime as needed for mild constipation.   VITAMIN D3 PO Take 25 mcg by mouth daily.        Follow-up Information     Kelsey Lum CROME, NP Follow up on 11/02/2024.   Specialty: Cardiology Why: [facility]iology [facility] Follow-up on 11/02/2024 at 2:20 PM. Contact information: [address] Kelsey Nelson [address] [phone]                Discharge Exam: Kelsey Nelson   10/18/24 1758 10/19/24 0229 10/20/24 0422  Weight: 57.6 kg 55.3 kg 54.9 kg        General:  AAO x 3,  cooperative, no distress;   HEENT:  Normocephalic, PERRL, otherwise with in Normal limits   Neuro:  CNII-XII intact. , normal motor and sensation, reflexes intact   Lungs:   Clear to auscultation BL, Respirations unlabored,  No wheezes / crackles  Cardio:    S1/S2, RRR, No murmure, No  Rubs or Gallops   Abdomen:  Soft, non-tender, bowel sounds active all four quadrants, no guarding or peritoneal signs.  Muscular  skeletal:  Limited exam -global generalized weaknesses - in bed, able to move all 4 extremities,   2+ pulses,  symmetric, No pitting edema  Skin:  Dry, warm to touch, negative for any Rashes,  Wounds: Please see nursing documentation         Condition at discharge: fair  The results of significant diagnostics from this hospitalization (including imaging, microbiology, ancillary and laboratory) are listed below for reference.   Imaging Studies: ECHOCARDIOGRAM COMPLETE Result Date: 10/19/2024    ECHOCARDIOGRAM REPORT   Patient Name:   Kelsey Nelson Date of Exam: 10/19/2024 Medical Rec #:  [id number]      Height:       65.0 in Accession #:  [id number]     Weight:       121.9 lb Date of Birth:  1934/11/08       BSA:          1.603 m Patient Age:    89 years       BP:           139/40 mmHg Patient Gender: F              HR:           69 bpm. Exam Location:  [name] Procedure: 2D Echo, Cardiac Doppler and Color Doppler (Both Spectral and Color            Flow Doppler were utilized during procedure). Indications:    Dyspnea R06.00  History:        Patient has no prior history of Echocardiogram examinations.                 CHF, Signs/Symptoms:Dyspnea; Risk Factors:Hypertension. H/O                 Hyperlipidemia.  Sonographer:    BERNARDA ROCKS Referring Phys: [id number] JONATHAN F BRANCH IMPRESSIONS  1. Left ventricular ejection fraction, by estimation, is 55 to 60%. The left ventricle has normal function. The left ventricle has no regional wall motion abnormalities. Left ventricular diastolic parameters are indeterminate.  2. Right ventricular systolic function is normal. The right ventricular size is normal. Tricuspid regurgitation signal is inadequate for assessing PA pressure.  3. A small pericardial effusion is present. The pericardial effusion is circumferential.  4.  The mitral valve is normal in structure. Trivial mitral valve regurgitation. No evidence of mitral stenosis.  5. The aortic valve is tricuspid. There is mild calcification of the aortic valve. There is mild thickening of the aortic valve. Aortic valve regurgitation is moderate. No aortic stenosis is present.  6. The inferior vena cava is normal in size with <50% respiratory variability, suggesting right atrial pressure of 8 mmHg. FINDINGS  Left Ventricle: Left ventricular ejection fraction, by estimation, is 55 to 60%. The left ventricle has normal function. The left ventricle has no regional wall motion abnormalities. The left ventricular internal cavity size was normal in size. There is  no left ventricular hypertrophy. Left ventricular diastolic parameters are indeterminate. Right Ventricle: The right ventricular size is normal. Right vetricular wall thickness was not well visualized. Right ventricular systolic function is normal. Tricuspid regurgitation signal is inadequate for assessing PA pressure. Left Atrium: Left atrial size was normal in size. Right Atrium: Right atrial size was normal in size. Pericardium: A small pericardial effusion is present. The pericardial effusion is circumferential. Mitral Valve: The mitral valve is normal in structure. Trivial mitral valve regurgitation. No evidence of mitral valve stenosis. MV peak gradient, 3.4 mmHg. The mean mitral valve gradient is 2.0 mmHg. Tricuspid Valve: The tricuspid valve is not well visualized. Tricuspid valve regurgitation is not demonstrated. No evidence of tricuspid stenosis. Aortic Valve: The aortic valve is tricuspid. There is mild calcification of the aortic valve. There is mild thickening of the aortic valve. There is mild aortic valve annular calcification. Aortic valve regurgitation is moderate. Aortic regurgitation PHT  measures 317 msec. No aortic stenosis is present. Aortic valve mean gradient measures 4.0 mmHg. Aortic valve peak gradient  measures 8.5 mmHg. Aortic valve area, by VTI measures 2.26 cm. Pulmonic Valve: The pulmonic valve was not well visualized. Pulmonic valve regurgitation is not visualized. No evidence of pulmonic stenosis. Aorta: The  aortic root and ascending aorta are structurally normal, with no evidence of dilitation. Venous: The inferior vena cava is normal in size with less than 50% respiratory variability, suggesting right atrial pressure of 8 mmHg. IAS/Shunts: No atrial level shunt detected by color flow Doppler.  LEFT VENTRICLE PLAX 2D LVIDd:         4.20 cm      Diastology LVIDs:         2.70 cm      LV e' medial:    5.55 cm/s LV PW:         0.90 cm      LV E/e' medial:  13.1 LV IVS:        0.80 cm      LV e' lateral:   6.74 cm/s LVOT diam:     1.70 cm      LV E/e' lateral: 10.8 LV SV:         66 LV SV Index:   41 LVOT Area:     2.27 cm  LV Volumes (MOD) LV vol d, MOD A2C: 112.0 ml LV vol d, MOD A4C: 114.0 ml LV vol s, MOD A2C: 47.3 ml LV vol s, MOD A4C: 47.0 ml LV SV MOD A2C:     64.7 ml LV SV MOD A4C:     114.0 ml LV SV MOD BP:      67.6 ml RIGHT VENTRICLE             IVC RV Basal diam:  2.90 cm     IVC diam: 1.70 cm RV S prime:     13.60 cm/s TAPSE (M-mode): 2.6 cm LEFT ATRIUM             Index        RIGHT ATRIUM          Index LA diam:        3.20 cm 2.00 cm/m   RA Area:     9.43 cm LA Vol (A2C):   41.4 ml 25.83 ml/m  RA Volume:   17.90 ml 11.17 ml/m LA Vol (A4C):   29.3 ml 18.28 ml/m LA Biplane Vol: 36.5 ml 22.77 ml/m  AORTIC VALVE                    PULMONIC VALVE AV Area (Vmax):    2.15 cm     PV Vmax:          0.82 m/s AV Area (Vmean):   2.04 cm     PV Peak grad:     2.7 mmHg AV Area (VTI):     2.26 cm     PR End Diast Vel: 1.73 msec AV Vmax:           146.00 cm/s AV Vmean:          92.000 cm/s AV VTI:            0.290 m AV Peak Grad:      8.5 mmHg AV Mean Grad:      4.0 mmHg LVOT Vmax:         138.00 cm/s LVOT Vmean:        82.600 cm/s LVOT VTI:          0.289 m LVOT/AV VTI ratio: 1.00 AI PHT:             317 msec  AORTA Ao Root diam: 2.80 cm Ao Asc diam:  3.40 cm MITRAL VALVE MV Area (PHT): 3.48 cm  SHUNTS MV Area VTI:   2.09 cm    Systemic VTI:  0.29 m MV Peak grad:  3.4 mmHg    Systemic Diam: 1.70 cm MV Mean grad:  2.0 mmHg MV Vmax:       0.92 m/s MV Vmean:      59.7 cm/s MV Decel Time: 218 msec MR Peak grad: 44.9 mmHg MR Vmax:      335.00 cm/s MV E velocity: 72.70 cm/s MV A velocity: 66.30 cm/s MV E/A ratio:  1.10 Dorn Ross MD Electronically signed by Dorn Ross MD Signature Date/Time: 10/19/2024/4:29:27 PM    Final    CT Angio Chest PE W/Cm &/Or Wo Cm Result Date: 10/18/2024 CLINICAL DATA:  Dyspnea on exertion EXAM: CT ANGIOGRAPHY CHEST WITH CONTRAST TECHNIQUE: Multidetector CT imaging of the chest was performed using the standard protocol during bolus administration of intravenous contrast. Multiplanar CT image reconstructions and MIPs were obtained to evaluate the vascular anatomy. RADIATION DOSE REDUCTION: This exam was performed according to the departmental dose-optimization program which includes automated exposure control, adjustment of the mA and/or kV according to patient size and/or use of iterative reconstruction technique. CONTRAST:  75mL OMNIPAQUE  IOHEXOL  350 MG/ML SOLN COMPARISON:  Same day chest radiograph FINDINGS: Cardiovascular: The study is high quality for the evaluation of pulmonary embolism. There are no filling defects in the central, lobar, segmental or subsegmental pulmonary artery branches to suggest acute pulmonary embolism. Great vessels are normal in course and caliber. Mild left atrial enlargement. No significant pericardial fluid/thickening. Aortic atherosclerosis. Reflux of contrast material into the hepatic veins, suggesting a degree of right heart dysfunction. Mediastinum/Nodes: Imaged thyroid  gland without nodules meeting criteria for imaging follow-up by size. Normal esophagus. No pathologically enlarged axillary, supraclavicular, mediastinal, or hilar lymph  nodes. Lungs/Pleura: The central airways are patent. Diffuse ground-glass opacities and interlobular septal thickening. No focal consolidation. No pneumothorax. Moderate right and small left pleural effusions. Upper abdomen: Normal. Musculoskeletal: No acute or abnormal lytic or blastic osseous lesions. Multilevel degenerative changes of the thoracic spine. Bifid left fourth rib. Review of the MIP images confirms the above findings. IMPRESSION: 1. No evidence of pulmonary embolism. 2. Findings of pulmonary edema and moderate right and small left pleural effusions. 3. Reflux of contrast material into the hepatic veins, suggesting a degree of right heart dysfunction. 4.  Aortic Atherosclerosis (ICD10-I70.0). Electronically Signed   By: Limin  Xu M.D.   On: 10/18/2024 14:03   DG Chest Port 1 View Result Date: 10/18/2024 EXAM: 1 VIEW(S) XRAY OF THE CHEST 10/18/2024 09:51:00 AM COMPARISON: 06/10/2022 CLINICAL HISTORY: Dyspnea, worsening shortness of breath, and history of hypertension. FINDINGS: LUNGS AND PLEURA: Mild bilateral interstitial lung markings, potentially a manifestation of atypical pneumonia or noncardiogenic edema. Small bilateral pleural effusions. No focal pulmonary opacity. No pneumothorax. HEART AND MEDIASTINUM: Aortic atherosclerosis. No acute abnormality of the cardiac and mediastinal silhouettes. BONES AND SOFT TISSUES: Thoracolumbar spine levoscoliosis. No acute osseous abnormality. IMPRESSION: 1. Mild bilateral interstitial lung markings, possibly atypical pneumonia or noncardiogenic edema. 2. Small bilateral pleural effusions. 3. Aortic atherosclerosis. 4. Thoracolumbar spine levoscoliosis. Electronically signed by: Ryan Salvage MD 10/18/2024 10:28 AM EDT RP Workstation: [id number]    Microbiology: Results for orders placed or performed in visit on 11/28/23  Microscopic Examination     Status: None   Collection Time: 11/28/23 12:25 PM   Urine  Result Value Ref Range Status   WBC,  UA 0-5 0 - 5 /hpf Final   RBC, Urine 0-2 0 - 2 /hpf Final  Epithelial Cells (non renal) 0-10 0 - 10 /hpf Final   Bacteria, UA None seen None seen/Few Final    Labs: CBC: Recent Labs  Lab 10/18/24 0951 10/19/24 0450  WBC 7.2 6.9  NEUTROABS 5.1  --   HGB 12.5 12.0  HCT 38.5 36.2  MCV 95.3 94.3  PLT 286 289   Basic Metabolic Panel: Recent Labs  Lab 10/18/24 0951 10/19/24 0450 10/20/24 0451  NA 136 138 136  K 4.3 3.7 3.8  CL 100 100 98  CO2 25 27 29   GLUCOSE 103* 89 98  BUN 14 16 18   CREATININE 0.91 0.93 1.14*  CALCIUM  9.2 8.7* 8.9  MG  --  2.1 2.3   Liver Function Tests: Recent Labs  Lab 10/18/24 0951  AST 24  ALT 28  ALKPHOS 102  BILITOT 0.5  PROT 6.7  ALBUMIN 3.8   CBG: No results for input(s): GLUCAP in the last 168 hours.  Discharge time spent: greater than 45 minutes.  Signed: Adriana DELENA Grams, MD [facility] 10/20/2024

## 2024-10-20 NOTE — Progress Notes (Signed)
 Rounding Note   Patient Name: Kelsey Nelson Date of Encounter: 10/20/2024  [facility] Cardiologist: New, requests Dr Francyne  Subjective SOB has resolved.   Scheduled Meds:  donepezil   10 mg Oral QHS   enoxaparin (LOVENOX) injection  40 mg Subcutaneous Q24H   fluticasone   1 spray Each Nare Daily   furosemide  20 mg Intravenous Q12H   pantoprazole   40 mg Oral BID   polyethylene glycol  17 g Oral Daily   prednisoLONE acetate  1 drop Both Eyes QID   ramipril   10 mg Oral Daily   simethicone   40 mg Oral TID   Continuous Infusions:  PRN Meds: acetaminophen  **OR** acetaminophen , alum & mag hydroxide-simeth, melatonin, methocarbamol, Muscle Rub, ondansetron  **OR** ondansetron  (ZOFRAN ) IV, senna   Vital Signs  Vitals:   10/19/24 1318 10/19/24 2013 10/20/24 0420 10/20/24 0422  BP: (!) 139/40 (!) 115/50 (!) 141/39   Pulse: 76 79 74   Resp: 18 18 18    Temp: 97.7 F (36.5 C) 97.9 F (36.6 C) 98.8 F (37.1 C)   TempSrc: Oral Oral Oral   SpO2: 96% 95% 96%   Weight:    54.9 kg  Height:       No intake or output data in the 24 hours ending 10/20/24 0812    10/20/2024    4:22 AM 10/19/2024    2:29 AM 10/18/2024    5:58 PM  Last 3 Weights  Weight (lbs) 121 lb 0.5 oz 121 lb 14.6 oz 126 lb 15.8 oz  Weight (kg) 54.9 kg 55.3 kg 57.6 kg      Telemetry SR, rare PVCs - Personally Reviewed  ECG  N/a - Personally Reviewed  Physical Exam  GEN: No acute distress.   Neck: No JVD Cardiac: RRR, no murmurs, rubs, or gallops.  Respiratory: Clear to auscultation bilaterally. GI: Soft, nontender, non-distended  MS: No edema; No deformity. Neuro:  Nonfocal  Psych: Normal affect   Labs High Sensitivity Troponin:  No results for input(s): TROPONINIHS in the last 720 hours.   Chemistry Recent Labs  Lab 10/18/24 0951 10/19/24 0450 10/20/24 0451  NA 136 138 136  K 4.3 3.7 3.8  CL 100 100 98  CO2 25 27 29   GLUCOSE 103* 89 98  BUN 14 16 18   CREATININE 0.91  0.93 1.14*  CALCIUM  9.2 8.7* 8.9  MG  --  2.1 2.3  PROT 6.7  --   --   ALBUMIN 3.8  --   --   AST 24  --   --   ALT 28  --   --   ALKPHOS 102  --   --   BILITOT 0.5  --   --   GFRNONAA >60 59* 46*  ANIONGAP 10 11 9     Lipids No results for input(s): CHOL, TRIG, HDL, LABVLDL, LDLCALC, CHOLHDL in the last 168 hours.  Hematology Recent Labs  Lab 10/18/24 0951 10/19/24 0450  WBC 7.2 6.9  RBC 4.04 3.84*  HGB 12.5 12.0  HCT 38.5 36.2  MCV 95.3 94.3  MCH 30.9 31.3  MCHC 32.5 33.1  RDW 13.6 13.4  PLT 286 289   Thyroid   Recent Labs  Lab 10/19/24 0450  TSH 2.460    BNP Recent Labs  Lab 10/18/24 0951  PROBNP 1,986.0*    DDimer No results for input(s): DDIMER in the last 168 hours.   Radiology  ECHOCARDIOGRAM COMPLETE Result Date: 10/19/2024    ECHOCARDIOGRAM REPORT   Patient Name:  Kelsey Nelson Date of Exam: 10/19/2024 Medical Rec #:  [id number]      Height:       65.0 in Accession #:    [id number]     Weight:       121.9 lb Date of Birth:  March 10, 1934       BSA:          1.603 m Patient Age:    89 years       BP:           139/40 mmHg Patient Gender: F              HR:           69 bpm. Exam Location:  [name] Procedure: 2D Echo, Cardiac Doppler and Color Doppler (Both Spectral and Color            Flow Doppler were utilized during procedure). Indications:    Dyspnea R06.00  History:        Patient has no prior history of Echocardiogram examinations.                 CHF, Signs/Symptoms:Dyspnea; Risk Factors:Hypertension. H/O                 Hyperlipidemia.  Sonographer:    BERNARDA ROCKS Referring Phys: [id number] Anikin Prosser F Berdell Nevitt IMPRESSIONS  1. Left ventricular ejection fraction, by estimation, is 55 to 60%. The left ventricle has normal function. The left ventricle has no regional wall motion abnormalities. Left ventricular diastolic parameters are indeterminate.  2. Right ventricular systolic function is normal. The right ventricular size is normal. Tricuspid  regurgitation signal is inadequate for assessing PA pressure.  3. A small pericardial effusion is present. The pericardial effusion is circumferential.  4. The mitral valve is normal in structure. Trivial mitral valve regurgitation. No evidence of mitral stenosis.  5. The aortic valve is tricuspid. There is mild calcification of the aortic valve. There is mild thickening of the aortic valve. Aortic valve regurgitation is moderate. No aortic stenosis is present.  6. The inferior vena cava is normal in size with <50% respiratory variability, suggesting right atrial pressure of 8 mmHg. FINDINGS  Left Ventricle: Left ventricular ejection fraction, by estimation, is 55 to 60%. The left ventricle has normal function. The left ventricle has no regional wall motion abnormalities. The left ventricular internal cavity size was normal in size. There is  no left ventricular hypertrophy. Left ventricular diastolic parameters are indeterminate. Right Ventricle: The right ventricular size is normal. Right vetricular wall thickness was not well visualized. Right ventricular systolic function is normal. Tricuspid regurgitation signal is inadequate for assessing PA pressure. Left Atrium: Left atrial size was normal in size. Right Atrium: Right atrial size was normal in size. Pericardium: A small pericardial effusion is present. The pericardial effusion is circumferential. Mitral Valve: The mitral valve is normal in structure. Trivial mitral valve regurgitation. No evidence of mitral valve stenosis. MV peak gradient, 3.4 mmHg. The mean mitral valve gradient is 2.0 mmHg. Tricuspid Valve: The tricuspid valve is not well visualized. Tricuspid valve regurgitation is not demonstrated. No evidence of tricuspid stenosis. Aortic Valve: The aortic valve is tricuspid. There is mild calcification of the aortic valve. There is mild thickening of the aortic valve. There is mild aortic valve annular calcification. Aortic valve regurgitation is  moderate. Aortic regurgitation PHT  measures 317 msec. No aortic stenosis is present. Aortic valve mean gradient measures 4.0 mmHg. Aortic valve peak gradient measures 8.5  mmHg. Aortic valve area, by VTI measures 2.26 cm. Pulmonic Valve: The pulmonic valve was not well visualized. Pulmonic valve regurgitation is not visualized. No evidence of pulmonic stenosis. Aorta: The aortic root and ascending aorta are structurally normal, with no evidence of dilitation. Venous: The inferior vena cava is normal in size with less than 50% respiratory variability, suggesting right atrial pressure of 8 mmHg. IAS/Shunts: No atrial level shunt detected by color flow Doppler.  LEFT VENTRICLE PLAX 2D LVIDd:         4.20 cm      Diastology LVIDs:         2.70 cm      LV e' medial:    5.55 cm/s LV PW:         0.90 cm      LV E/e' medial:  13.1 LV IVS:        0.80 cm      LV e' lateral:   6.74 cm/s LVOT diam:     1.70 cm      LV E/e' lateral: 10.8 LV SV:         66 LV SV Index:   41 LVOT Area:     2.27 cm  LV Volumes (MOD) LV vol d, MOD A2C: 112.0 ml LV vol d, MOD A4C: 114.0 ml LV vol s, MOD A2C: 47.3 ml LV vol s, MOD A4C: 47.0 ml LV SV MOD A2C:     64.7 ml LV SV MOD A4C:     114.0 ml LV SV MOD BP:      67.6 ml RIGHT VENTRICLE             IVC RV Basal diam:  2.90 cm     IVC diam: 1.70 cm RV S prime:     13.60 cm/s TAPSE (M-mode): 2.6 cm LEFT ATRIUM             Index        RIGHT ATRIUM          Index LA diam:        3.20 cm 2.00 cm/m   RA Area:     9.43 cm LA Vol (A2C):   41.4 ml 25.83 ml/m  RA Volume:   17.90 ml 11.17 ml/m LA Vol (A4C):   29.3 ml 18.28 ml/m LA Biplane Vol: 36.5 ml 22.77 ml/m  AORTIC VALVE                    PULMONIC VALVE AV Area (Vmax):    2.15 cm     PV Vmax:          0.82 m/s AV Area (Vmean):   2.04 cm     PV Peak grad:     2.7 mmHg AV Area (VTI):     2.26 cm     PR End Diast Vel: 1.73 msec AV Vmax:           146.00 cm/s AV Vmean:          92.000 cm/s AV VTI:            0.290 m AV Peak Grad:      8.5 mmHg  AV Mean Grad:      4.0 mmHg LVOT Vmax:         138.00 cm/s LVOT Vmean:        82.600 cm/s LVOT VTI:          0.289 m LVOT/AV VTI ratio: 1.00 AI PHT:  317 msec  AORTA Ao Root diam: 2.80 cm Ao Asc diam:  3.40 cm MITRAL VALVE MV Area (PHT): 3.48 cm    SHUNTS MV Area VTI:   2.09 cm    Systemic VTI:  0.29 m MV Peak grad:  3.4 mmHg    Systemic Diam: 1.70 cm MV Mean grad:  2.0 mmHg MV Vmax:       0.92 m/s MV Vmean:      59.7 cm/s MV Decel Time: 218 msec MR Peak grad: 44.9 mmHg MR Vmax:      335.00 cm/s MV E velocity: 72.70 cm/s MV A velocity: 66.30 cm/s MV E/A ratio:  1.10 Dorn Ross MD Electronically signed by Dorn Ross MD Signature Date/Time: 10/19/2024/4:29:27 PM    Final    CT Angio Chest PE W/Cm &/Or Wo Cm Result Date: 10/18/2024 CLINICAL DATA:  Dyspnea on exertion EXAM: CT ANGIOGRAPHY CHEST WITH CONTRAST TECHNIQUE: Multidetector CT imaging of the chest was performed using the standard protocol during bolus administration of intravenous contrast. Multiplanar CT image reconstructions and MIPs were obtained to evaluate the vascular anatomy. RADIATION DOSE REDUCTION: This exam was performed according to the departmental dose-optimization program which includes automated exposure control, adjustment of the mA and/or kV according to patient size and/or use of iterative reconstruction technique. CONTRAST:  75mL OMNIPAQUE  IOHEXOL  350 MG/ML SOLN COMPARISON:  Same day chest radiograph FINDINGS: Cardiovascular: The study is high quality for the evaluation of pulmonary embolism. There are no filling defects in the central, lobar, segmental or subsegmental pulmonary artery branches to suggest acute pulmonary embolism. Great vessels are normal in course and caliber. Mild left atrial enlargement. No significant pericardial fluid/thickening. Aortic atherosclerosis. Reflux of contrast material into the hepatic veins, suggesting a degree of right heart dysfunction. Mediastinum/Nodes: Imaged thyroid  gland  without nodules meeting criteria for imaging follow-up by size. Normal esophagus. No pathologically enlarged axillary, supraclavicular, mediastinal, or hilar lymph nodes. Lungs/Pleura: The central airways are patent. Diffuse ground-glass opacities and interlobular septal thickening. No focal consolidation. No pneumothorax. Moderate right and small left pleural effusions. Upper abdomen: Normal. Musculoskeletal: No acute or abnormal lytic or blastic osseous lesions. Multilevel degenerative changes of the thoracic spine. Bifid left fourth rib. Review of the MIP images confirms the above findings. IMPRESSION: 1. No evidence of pulmonary embolism. 2. Findings of pulmonary edema and moderate right and small left pleural effusions. 3. Reflux of contrast material into the hepatic veins, suggesting a degree of right heart dysfunction. 4.  Aortic Atherosclerosis (ICD10-I70.0). Electronically Signed   By: Limin  Xu M.D.   On: 10/18/2024 14:03   DG Chest Port 1 View Result Date: 10/18/2024 EXAM: 1 VIEW(S) XRAY OF THE CHEST 10/18/2024 09:51:00 AM COMPARISON: 06/10/2022 CLINICAL HISTORY: Dyspnea, worsening shortness of breath, and history of hypertension. FINDINGS: LUNGS AND PLEURA: Mild bilateral interstitial lung markings, potentially a manifestation of atypical pneumonia or noncardiogenic edema. Small bilateral pleural effusions. No focal pulmonary opacity. No pneumothorax. HEART AND MEDIASTINUM: Aortic atherosclerosis. No acute abnormality of the cardiac and mediastinal silhouettes. BONES AND SOFT TISSUES: Thoracolumbar spine levoscoliosis. No acute osseous abnormality. IMPRESSION: 1. Mild bilateral interstitial lung markings, possibly atypical pneumonia or noncardiogenic edema. 2. Small bilateral pleural effusions. 3. Aortic atherosclerosis. 4. Thoracolumbar spine levoscoliosis. Electronically signed by: Ryan Salvage MD 10/18/2024 10:28 AM EDT RP Workstation: [id number]    Cardiac Studies   Patient Profile    Kelsey Nelson is a 89 y.o. female with a hx of  HTN, HLD, aortic atherosclerosis (CT scan in 2014), GERD, diverticulosis  who is being seen 10/19/2024 for the evaluation of new HF at the request of Dr. Ricky.   Assessment & Plan   1.Acute HFpEF - presented with SOB/DOE - CXR/CT PE with signs of pulmonary edema. proBNP 1986 - echo: LVEF 55-60%, no WMAs, indet diastolic function, normal RV function  - she is on IV lasix 20mg  bid. I/Os incomplete. 126 >121 lbs. Uptrend in Cr, will d/c IV lasix, did get 20mg  early this AM.  - euvolemic by exam, SOB has resolved.  - can start oral diuretic tomorrow lasix 20mg  daily.  - start jardiance 10mg  daily in setting of HFpE   Ok for discharge from cardiac standpoint. She has family/friends who see Dr Francyne and would like to establish with him. We will arrange. We will sign off inpatient care.        For questions or updates, please contact [facility] Please consult [email or web address] for contact info under       Signed, Alvan Carrier, MD  10/20/2024, 8:12 AM

## 2024-10-20 NOTE — Progress Notes (Signed)
  Per Guidelines for [place] (body mass index of 22 to 36) unable to obtain [place] value due to BMI less than 22. Patient BMI 20.14

## 2024-10-20 NOTE — Care Management Important Message (Signed)
 Important Message  Patient Details  Name: Kelsey Nelson MRN: [id number] Date of Birth: 17-May-1934   Important Message Given:  N/A - LOS <3 / Initial given by admissions     [name] 10/20/2024, 11:15 AM

## 2024-10-21 ENCOUNTER — Telehealth: Payer: Self-pay

## 2024-10-21 NOTE — Transitions of Care (Post Inpatient/ED Visit) (Signed)
 10/21/2024  Name: Kelsey Nelson MRN: [id number] DOB: March 12, 1934  Today's TOC FU Call Status: Today's TOC FU Call Status:: Successful TOC FU Call Completed TOC FU Call Complete Date: 10/21/24 Patient's Name and Date of Birth confirmed.  Transition Care Management Follow-up Telephone Call Date of Discharge: 10/20/24 Discharge [facility] Type of Discharge: Inpatient Admission Primary Inpatient Discharge Diagnosis:: Acute CHF How have you been since you were released from the hospital?: Same Any questions or concerns?: No  Items Reviewed: Did you receive and understand the discharge instructions provided?: Yes Medications obtained,verified, and reconciled?: Yes (Medications Reviewed) Any new allergies since your discharge?: No Dietary orders reviewed?: Yes Type of Diet Ordered:: Heart Healthy low sodium Do you have support at home?: Yes People in Home [RPT]: spouse Name of Support/Comfort Primary Source: [name], (spouse) but my daughter [name] likes to be at my appointments and helps me  Medications Reviewed Today: Medications Reviewed Today     Reviewed by Eilleen Richerd GRADE, RN (Registered Nurse) on 10/21/24 at [phone]  Med List Status: <None>   Medication Order Taking? Sig Documenting Provider Last Dose Status Informant  Cholecalciferol (VITAMIN D3 PO) [id number] Yes Take 25 mcg by mouth daily. [provider]  Active Self, Pharmacy Records  diphenhydrAMINE  (BENADRYL  ALLERGY) 25 mg capsule [id number] Yes Takes one at bedtime then 3 to 4 hours later takes a 2nd one [name], MD  Active Self, Pharmacy Records  donepezil  (ARICEPT ) 10 MG tablet [id number] Yes Take 1 tablet (10 mg total) by mouth at bedtime. [name], MD  Active Self, Pharmacy Records  empagliflozin (JARDIANCE) 10 MG TABS tablet [id number]  Take 1 tablet (10 mg total) by mouth daily. Willette Adriana LABOR, MD  Active   erythromycin  ophthalmic ointment [id number] Yes Apply 0.5  inches to eye 4 (four) times daily. [provider]  Active Self, Pharmacy Records  estradiol  (ESTRACE ) 0.1 MG/GM vaginal cream [id number] Yes Discard plastic applicator. Insert a blueberry size amount (approximately 1 gram) of cream on fingertip inside vagina at bedtime every night for 1 week then every other night. For long term use. Gerldine Lauraine BROCKS, FNP  Active Self, Pharmacy Records  fexofenadine  (ALLEGRA  ALLERGY) 180 MG tablet [id number] Yes Take 1 tablet (180 mg total) by mouth every morning. Tobie Arleta SQUIBB, MD  Active Self, Pharmacy Records           Med Note Rufus, [facility] M   Tue Apr 22, 2023  9:36 AM) As needed   fluticasone  (FLONASE ) 50 MCG/ACT nasal spray [id number] Yes Place 1 spray into both nostrils daily. Tobie Arleta SQUIBB, MD  Active Self, Pharmacy Records  furosemide (LASIX) 20 MG tablet [id number]  Take 1 tablet (20 mg total) by mouth daily. Willette Adriana LABOR, MD  Active   MAGNESIUM  PO [id number] Yes Take by mouth. [provider]  Active Self, Pharmacy Records  methocarbamol (ROBAXIN) 500 MG tablet [id number]  Take 1 tablet (500 mg total) by mouth every 8 (eight) hours as needed for up to 8 days for muscle spasms. Willette Adriana LABOR, MD  Active   Multiple Vitamin (MULTIVITAMIN PO) [id number] Yes Take by mouth. [provider]  Active Self, Pharmacy Records  omalizumab  (XOLAIR ) prefilled syringe 300 mg [id number]   Iva Marty Saltness, MD  Active            Med Note CARLYLE, TRACY P   Wed Aug 11, 2024  1:14 PM) Patient not taking  Omega-3 Fatty Acids (OMEGA 3 FISH  OIL PO) [id number] Yes Take 1 capsule by mouth daily. [provider]  Active Self, Pharmacy Records  OVER THE COUNTER MEDICATION [id number] Yes Multi Collagen Protein - Patient states that she takes 1 scoop a day. [provider]  Active Self, Pharmacy Records  OVER THE COUNTER MEDICATION [id number] Yes One daily hylands leg cramp relief [provider]  Active Self, Pharmacy  Records  OVER THE COUNTER MEDICATION [id number] Yes Omega xl joint and muscle support daily [provider]  Active Self, Pharmacy Records  polyethylene glycol (MIRALAX ) 17 g packet [id number]  Take 17 g by mouth daily. Leath-Warren, Etta PARAS, NP  Active Self, Pharmacy Records  prednisoLONE acetate (PRED FORTE) 1 % ophthalmic suspension [id number] Yes SMARTSIG:In Eye(s) [provider]  Active Self, Pharmacy Records  ramipril  (ALTACE ) 10 MG capsule [id number] Yes Take one capsule by mouth once daily for blood pressure [name], MD  Active Self, Pharmacy Records  senna-docusate (SENOKOT-S) 8.6-50 MG tablet [id number] Yes Take 2 tablets by mouth at bedtime as needed for mild constipation. Leath-Warren, Etta PARAS, NP  Active Self, Pharmacy Records            Home Care and Equipment/Supplies: Were Home Health Services Ordered?: NA Any new equipment or medical supplies ordered?: NA  Functional Questionnaire: Do you need assistance with bathing/showering or dressing?: No Do you need assistance with meal preparation?: No Do you need assistance with eating?: No Do you have difficulty maintaining continence: No Do you need assistance with getting out of bed/getting out of a chair/moving?: No Do you have difficulty managing or taking your medications?: No  Follow up appointments reviewed: PCP Follow-up appointment confirmed?: No (Offered assistance in Book It however, patient states, It would be best if [place] calls and makes the appointment because she wants to go with me, and I don't know her work schedule - reviewed in AVS 1-2 weeks reccomendation) Specialist Hospital Follow-up appointment confirmed?: Yes Date of Specialist follow-up appointment?: 11/02/24 Follow-Up Specialty Provider:: Lum Louis, NP - Cardiology Do you need transportation to your follow-up appointment?: No (My daughter is off 11/02/24 because it's [employer] Day and will take me) Do you  understand care options if your condition(s) worsen?: Yes-patient verbalized understanding  SDOH Interventions Today    Flowsheet Row Most Recent Value  SDOH Interventions   Food Insecurity Interventions Intervention Not Indicated  Housing Interventions Intervention Not Indicated  Transportation Interventions Intervention Not Indicated  Utilities Interventions Intervention Not Indicated    Goals Addressed             This Visit's Progress    VBCI Transitions of Care (TOC) Care Plan       Problems:  Recent Hospitalization for treatment of CHF Knowledge Deficit Related to HF and regimen for follow up  Goal:  Over the next 30 days, the patient will not experience hospital readmission  Interventions:  10/21/24 Reviewed AVS with patient: (HEART FAILURE PATIENTS) Call MD: Anytime you have any of the following symptoms: 1) 3 pound weight gain in 24 hours or 5 pounds in 1 week 2) shortness of breath, with or without a dry hacking cough 3) swelling in the hands, feet or stomach 4) if you have to sleep on extra pillows at night in order to breathe. Activity as tolerated - No restrictions Call MD for: difficulty breathing, headache or visual disturbances Call MD for: hives Call MD for: persistant dizziness or light-headedness Call MD for: persistant nausea and vomiting Call MD for: redness, tenderness,  or signs of infection (pain, swelling, redness, odor or green/yellow discharge around incision site) Call MD for: severe uncontrolled pain.  Patient verbalized understanding. Heart Failure Interventions: Basic overview and discussion of pathophysiology of Heart Failure reviewed Provided education on low sodium diet Reviewed Heart Failure Action Plan in depth and provided written copy Assessed need for readable accurate scales in home Provided education about placing scale on hard, flat surface Advised patient to weigh each morning after emptying bladder Discussed importance of daily  weight and advised patient to weigh and record daily Reviewed role of diuretics in prevention of fluid overload and management of heart failure; Discussed the importance of keeping all appointments with provider Provided patient with education about the role of exercise in the management of heart failure Screening for signs and symptoms of depression related to chronic disease state  Assessed social determinant of health barriers   Patient Self Care Activities:  Attend all scheduled provider appointments Call pharmacy for medication refills 3-7 days in advance of running out of medications Call provider office for new concerns or questions  Notify RN Care Manager of TOC call rescheduling needs Participate in Transition of Care Program/Attend TOC scheduled calls Take medications as prescribed    Plan:  The care management team will reach out to the patient again over the next 5 - 10 business days. The patient has been provided with contact information for the care management team and has been advised to call with any health related questions or concerns.  The patient will call [facility] for Cardiac Rehab as advised to call today as on page 3 of AVS. Patient verbalized understanding. Discussed and offered 30 day TOC program.  Patient  accepted.  The patient has been provided with contact information for the care management team and has been advised to call with any health -related questions or concerns.  The patient verbalized understanding with current plan of care.  The patient is directed to their insurance card regarding availability of benefits coverage.  Encouraged to check for blood pressure monitor.          Richerd Fish, RN, BSN, CCM [facility], [facility] Management Coordinator Direct Dial: [phone]

## 2024-10-21 NOTE — Patient Instructions (Signed)
 Visit Information  Thank you for taking time to visit with me today. Please don't hesitate to contact me if I can be of assistance to you before our next scheduled telephone appointment.  Our next appointment is by telephone on 10/29/24 at 11:00 AM  Following is a copy of your care plan:   Goals Addressed             This Visit's Progress    VBCI Transitions of Care (TOC) Care Plan       Problems:  Recent Hospitalization for treatment of CHF Knowledge Deficit Related to HF and regimen for follow up  Goal:  Over the next 30 days, the patient will not experience hospital readmission  Interventions:  10/21/24 Reviewed AVS with patient: (HEART FAILURE PATIENTS) Call MD: Anytime you have any of the following symptoms: 1) 3 pound weight gain in 24 hours or 5 pounds in 1 week 2) shortness of breath, with or without a dry hacking cough 3) swelling in the hands, feet or stomach 4) if you have to sleep on extra pillows at night in order to breathe. Activity as tolerated - No restrictions Call MD for: difficulty breathing, headache or visual disturbances Call MD for: hives Call MD for: persistant dizziness or light-headedness Call MD for: persistant nausea and vomiting Call MD for: redness, tenderness, or signs of infection (pain, swelling, redness, odor or green/yellow discharge around incision site) Call MD for: severe uncontrolled pain.  Patient verbalized understanding. Heart Failure Interventions: Basic overview and discussion of pathophysiology of Heart Failure reviewed Provided education on low sodium diet Reviewed Heart Failure Action Plan in depth and provided written copy Assessed need for readable accurate scales in home Provided education about placing scale on hard, flat surface Advised patient to weigh each morning after emptying bladder Discussed importance of daily weight and advised patient to weigh and record daily Reviewed role of diuretics in prevention of fluid  overload and management of heart failure; Discussed the importance of keeping all appointments with provider Provided patient with education about the role of exercise in the management of heart failure Screening for signs and symptoms of depression related to chronic disease state  Assessed social determinant of health barriers   Patient Self Care Activities:  Attend all scheduled provider appointments Call pharmacy for medication refills 3-7 days in advance of running out of medications Call provider office for new concerns or questions  Notify RN Care Manager of TOC call rescheduling needs Participate in Transition of Care Program/Attend TOC scheduled calls Take medications as prescribed    Plan:  The care management team will reach out to the patient again over the next 5 - 10 business days. The patient has been provided with contact information for the care management team and has been advised to call with any health related questions or concerns.  The patient will call [facility] for Cardiac Rehab as advised to call today as on page 3 of AVS. Patient verbalized understanding. Discussed and offered 30 day TOC program.  Patient  accepted.  The patient has been provided with contact information for the care management team and has been advised to call with any health -related questions or concerns.  The patient verbalized understanding with current plan of care.  The patient is directed to their insurance card regarding availability of benefits coverage.  Encouraged to check for blood pressure monitor.         The patient verbalized understanding of instructions, educational materials,  and care plan provided today and DECLINED offer to receive copy of patient instructions, educational materials, and care plan.  Patient is going to get daughter [name] to sign up for MyChart per instructions on page 7 of Hospital AVS.  The patient has been provided with contact  information for the care management team and has been advised to call with any health related questions or concerns.  The patient will call PCP office* as advised to hospital follow up with daughter [name] and Cardiac Rehab Phase II.   Call your doctor: (Anytime you feel any of the following symptoms) Reviewed Hospital AVS page 2-- 3-4 pound weight gain in 1-2 days or 2 pounds overnight Shortness of breath, with or without a dry hacking cough Swelling in the hands, feet or stomach If you have to sleep on extra pillows at night in order to breathe  Please call the care guide team at [phone] if you need to cancel or reschedule your appointment.   Please call the [place]  National Suicide Prevention Lifeline: (859)852-5045 or TTY: 431-350-9240 TTY 272-365-5143) to talk to a trained counselor if you are experiencing a Mental Health or Behavioral Health Crisis or need someone to talk to.  Richerd Fish, RN, BSN, CCM [facility], [facility] Management Coordinator Direct Dial: [phone]

## 2024-10-22 ENCOUNTER — Ambulatory Visit: Admitting: Family Medicine

## 2024-10-25 ENCOUNTER — Ambulatory Visit: Payer: Self-pay

## 2024-10-25 NOTE — Telephone Encounter (Signed)
 Patient/caller refused triage.  Reason for refusal: appointment tomorrow with cardiology.   Patient's daughter [name] was on the line with someone else as the call was transferred to NT. She says her mom has an appointment tomorrow, so she doesn't need to speak to me now.   Copied from CRM [id number]. Topic: Clinical - Red Word Triage >> Oct 25, 2024 10:00 AM Willma SAUNDERS wrote: [facility] that prompted transfer to Nurse Triage: Patient was seen in the ER on 10/18/24. Daughter [name] says she is worse now then when she went there. Has no appetite, is weak and is unable to stand, and is unable to get a good breath. Says she was referred to Cardiology on 11/02/24 but is concerned if she is not treated sooner will not make it until then.

## 2024-10-25 NOTE — Telephone Encounter (Signed)
 I called and left a message on  daughter, Jennifer's phone encouraging her to take her Mom to the ED based on symptoms reported

## 2024-10-26 ENCOUNTER — Ambulatory Visit: Attending: Physician Assistant | Admitting: Physician Assistant

## 2024-10-26 VITALS — BP 108/56 | HR 69 | Wt 123.0 lb

## 2024-10-26 DIAGNOSIS — R110 Nausea: Secondary | ICD-10-CM | POA: Diagnosis not present

## 2024-10-26 DIAGNOSIS — I5032 Chronic diastolic (congestive) heart failure: Secondary | ICD-10-CM | POA: Diagnosis not present

## 2024-10-26 DIAGNOSIS — R142 Eructation: Secondary | ICD-10-CM

## 2024-10-26 DIAGNOSIS — Z79899 Other long term (current) drug therapy: Secondary | ICD-10-CM

## 2024-10-26 DIAGNOSIS — I10 Essential (primary) hypertension: Secondary | ICD-10-CM | POA: Diagnosis not present

## 2024-10-26 DIAGNOSIS — I351 Nonrheumatic aortic (valve) insufficiency: Secondary | ICD-10-CM | POA: Diagnosis not present

## 2024-10-26 NOTE — Patient Instructions (Signed)
 Medication Instructions:  Your physician recommends that you continue on your current medications as directed. Please refer to the Current Medication list given to you today.  *If you need a refill on your cardiac medications before your next appointment, please call your pharmacy*  Lab Work: BMET today  Testing/Procedures: NONE ordered at this time of appointment   Follow-Up: At [facility], you and your health needs are our priority.  As part of our continuing mission to provide you with exceptional heart care, our providers are all part of one team.  This team includes your primary Cardiologist (physician) and Advanced Practice Providers or APPs (Physician Assistants and Nurse Practitioners) who all work together to provide you with the care you need, when you need it.  Your next appointment:   6 week Hao Meng PA 3-4 months Dr. Francyne   We recommend signing up for the patient portal called MyChart.  Sign up information is provided on this After Visit Summary.  MyChart is used to connect with patients for Virtual Visits (Telemedicine).  Patients are able to view lab/test results, encounter notes, upcoming appointments, etc.  Non-urgent messages can be sent to your provider as well.   To learn more about what you can do with MyChart, go to [email or web address].

## 2024-10-26 NOTE — Progress Notes (Addendum)
 Cardiology Office Note   Date:  10/26/2024  ID:  Kelsey Nelson, DOB 1934-12-12, MRN [id number] PCP: Kelsey Rollene BRAVO, MD  [facility] Providers Cardiologist:  Previously Dr. Jeffrie - Plan to set up with Dr. Francyne at the patient's request  History of Present Illness Kelsey Nelson is a 89 y.o. female with past medical history of hypertension, hyperlipidemia, aortic atherosclerosis seen on CT scan, GERD and history of diverticulosis.  She was remotely seen by Dr. Jeffrie in 2019 for evaluation of chest pain.  Myoview  ordered at the time was low risk with no ischemia or infarction, EF 78%.  She has been lost to follow-up since.  Patient was recently admitted to the hospital in October 2025 with dyspnea on exertion.  BNP came back elevated at 1986.  Serial enzyme borderline elevated but remained flat.  Chest x-ray suggestive of small bilateral pleural effusion and bilateral interstitial lung markings that could be either atypical pneumonia versus edema.  Chest CTA showed no PE but consistent with pulmonary edema was moderate right and a small left pleural effusion, reflux of contrast material into the hepatic veins suggestive of right heart dysfunction.  Cardiology service was consulted.  Echocardiogram obtained on 10/19/2024 showed EF 55 to 60%, no regional wall motion abnormality, small pericardial effusion, trivial MR, moderate AI.  She was treated with IV Lasix for acute diastolic heart failure.  She was started on Jardiance 10 mg daily for HFpEF and was ultimately discharged on Lasix 20 mg daily.  She was seen by Dr. Dorn Nelson during the hospitalization and establish with Dr. Francyne in [place] who she has heard from family and friends.  Discharge dry weight was 121 pounds.  Patient presents today for posthospital follow-up accompanied by daughter.  She has chronic burping going back several years.  She underwent EGD and a colonoscopy which has been unrevealing.  EGD did show a 2  cm hiatal hernia.  She is has been followed by Dr. Eartha of GI service.  She also has chronic nausea that is going back 3-month however workup has been negative as well.  She mentioned that she has altered taste since COVID infection 5 years ago, she cannot taste meat.  Her weight at home has been hovering around 119 pounds to 120 pounds which is stable from her discharge dry weight.  She has been fatigued, I will obtain a basic metabolic panel to make sure she is not being dehydrated.  She has been compliant with Jardiance and Lasix 20 mg daily.  On physical exam, she does not have any lower extremity edema.  She continues to have trace amount of crackles in the base of the lung and mild JVD, however there is no sign of significant tricuspid valve regurgitation seen on recent echocardiogram.  According to the patient, she was walking several miles a day until about a week prior to the hospitalization when she started noticing worsening dyspnea.  She denies any chest pain.  EKG showed no significant ST-T wave changes and no occasional PVCs.  I will obtain basic metabolic panel, if there is any sign of dehydration, I may change the Lasix to as needed.  Otherwise, patient appears to be euvolemic on exam.  I plan to see the patient back myself in 6 weeks and have her establish with Dr. Francyne in 3 to 4 months.  We discussed heart failure prevention strategy including low-salt diet, fluid restriction to between 32 to 64 ounces per day and daily weight.  She has been instructed to contact cardiology service if her weight increased by more than 3 pounds overnight or 5 pounds in single week.    ROS:   Patient complains of dyspnea on exertion that started about a week prior to the recent hospitalization.  She has chronic burping and nausea but no vomiting.  She denies any chest pain.  Studies Reviewed      Cardiac Studies & Procedures    ______________________________________________________________________________________________   STRESS TESTS  MYOCARDIAL PERFUSION IMAGING 09/18/2018  Interpretation Summary  Nuclear stress EF: 78%. The left ventricular ejection fraction is hyperdynamic (>65%).  There was no ST segment deviation noted during stress.  The study is normal. There is no evidence of ischemia. There is no evidence of previous infarction.  This is a low risk study.   ECHOCARDIOGRAM  ECHOCARDIOGRAM COMPLETE 10/19/2024  Narrative ECHOCARDIOGRAM REPORT    Patient Name:   [name] Date of Exam: 10/19/2024 Medical Rec #:  [id number]      Height:       65.0 in Accession #:    [id number]     Weight:       121.9 lb Date of Birth:  Mar 25, 1934       BSA:          1.603 m Patient Age:    89 years       BP:           139/40 mmHg Patient Gender: F              HR:           69 bpm. Exam Location:  [name]  Procedure: 2D Echo, Cardiac Doppler and Color Doppler (Both Spectral and Color Flow Doppler were utilized during procedure).  Indications:    Dyspnea R06.00  History:        Patient has no prior history of Echocardiogram examinations. CHF, Signs/Symptoms:Dyspnea; Risk Factors:Hypertension. H/O Hyperlipidemia.  Sonographer:    BERNARDA ROCKS Referring Phys: [id number] JONATHAN F BRANCH  IMPRESSIONS   1. Left ventricular ejection fraction, by estimation, is 55 to 60%. The left ventricle has normal function. The left ventricle has no regional wall motion abnormalities. Left ventricular diastolic parameters are indeterminate. 2. Right ventricular systolic function is normal. The right ventricular size is normal. Tricuspid regurgitation signal is inadequate for assessing PA pressure. 3. A small pericardial effusion is present. The pericardial effusion is circumferential. 4. The mitral valve is normal in structure. Trivial mitral valve regurgitation. No evidence of mitral stenosis. 5. The aortic  valve is tricuspid. There is mild calcification of the aortic valve. There is mild thickening of the aortic valve. Aortic valve regurgitation is moderate. No aortic stenosis is present. 6. The inferior vena cava is normal in size with <50% respiratory variability, suggesting right atrial pressure of 8 mmHg.  FINDINGS Left Ventricle: Left ventricular ejection fraction, by estimation, is 55 to 60%. The left ventricle has normal function. The left ventricle has no regional wall motion abnormalities. The left ventricular internal cavity size was normal in size. There is no left ventricular hypertrophy. Left ventricular diastolic parameters are indeterminate.  Right Ventricle: The right ventricular size is normal. Right vetricular wall thickness was not well visualized. Right ventricular systolic function is normal. Tricuspid regurgitation signal is inadequate for assessing PA pressure.  Left Atrium: Left atrial size was normal in size.  Right Atrium: Right atrial size was normal in size.  Pericardium: A small pericardial effusion is present. The pericardial effusion  is circumferential.  Mitral Valve: The mitral valve is normal in structure. Trivial mitral valve regurgitation. No evidence of mitral valve stenosis. MV peak gradient, 3.4 mmHg. The mean mitral valve gradient is 2.0 mmHg.  Tricuspid Valve: The tricuspid valve is not well visualized. Tricuspid valve regurgitation is not demonstrated. No evidence of tricuspid stenosis.  Aortic Valve: The aortic valve is tricuspid. There is mild calcification of the aortic valve. There is mild thickening of the aortic valve. There is mild aortic valve annular calcification. Aortic valve regurgitation is moderate. Aortic regurgitation PHT measures 317 msec. No aortic stenosis is present. Aortic valve mean gradient measures 4.0 mmHg. Aortic valve peak gradient measures 8.5 mmHg. Aortic valve area, by VTI measures 2.26 cm.  Pulmonic Valve: The pulmonic valve  was not well visualized. Pulmonic valve regurgitation is not visualized. No evidence of pulmonic stenosis.  Aorta: The aortic root and ascending aorta are structurally normal, with no evidence of dilitation.  Venous: The inferior vena cava is normal in size with less than 50% respiratory variability, suggesting right atrial pressure of 8 mmHg.  IAS/Shunts: No atrial level shunt detected by color flow Doppler.   LEFT VENTRICLE PLAX 2D LVIDd:         4.20 cm      Diastology LVIDs:         2.70 cm      LV e' medial:    5.55 cm/s LV PW:         0.90 cm      LV E/e' medial:  13.1 LV IVS:        0.80 cm      LV e' lateral:   6.74 cm/s LVOT diam:     1.70 cm      LV E/e' lateral: 10.8 LV SV:         66 LV SV Index:   41 LVOT Area:     2.27 cm  LV Volumes (MOD) LV vol d, MOD A2C: 112.0 ml LV vol d, MOD A4C: 114.0 ml LV vol s, MOD A2C: 47.3 ml LV vol s, MOD A4C: 47.0 ml LV SV MOD A2C:     64.7 ml LV SV MOD A4C:     114.0 ml LV SV MOD BP:      67.6 ml  RIGHT VENTRICLE             IVC RV Basal diam:  2.90 cm     IVC diam: 1.70 cm RV S prime:     13.60 cm/s TAPSE (M-mode): 2.6 cm  LEFT ATRIUM             Index        RIGHT ATRIUM          Index LA diam:        3.20 cm 2.00 cm/m   RA Area:     9.43 cm LA Vol (A2C):   41.4 ml 25.83 ml/m  RA Volume:   17.90 ml 11.17 ml/m LA Vol (A4C):   29.3 ml 18.28 ml/m LA Biplane Vol: 36.5 ml 22.77 ml/m AORTIC VALVE                    PULMONIC VALVE AV Area (Vmax):    2.15 cm     PV Vmax:          0.82 m/s AV Area (Vmean):   2.04 cm     PV Peak grad:     2.7 mmHg AV Area (VTI):  2.26 cm     PR End Diast Vel: 1.73 msec AV Vmax:           146.00 cm/s AV Vmean:          92.000 cm/s AV VTI:            0.290 m AV Peak Grad:      8.5 mmHg AV Mean Grad:      4.0 mmHg LVOT Vmax:         138.00 cm/s LVOT Vmean:        82.600 cm/s LVOT VTI:          0.289 m LVOT/AV VTI ratio: 1.00 AI PHT:            317 msec  AORTA Ao Root diam: 2.80  cm Ao Asc diam:  3.40 cm  MITRAL VALVE MV Area (PHT): 3.48 cm    SHUNTS MV Area VTI:   2.09 cm    Systemic VTI:  0.29 m MV Peak grad:  3.4 mmHg    Systemic Diam: 1.70 cm MV Mean grad:  2.0 mmHg MV Vmax:       0.92 m/s MV Vmean:      59.7 cm/s MV Decel Time: 218 msec MR Peak grad: 44.9 mmHg MR Vmax:      335.00 cm/s MV E velocity: 72.70 cm/s MV A velocity: 66.30 cm/s MV E/A ratio:  1.10  Kelsey Ross MD Electronically signed by Kelsey Ross MD Signature Date/Time: 10/19/2024/4:29:27 PM    Final    MONITORS  CARDIAC EVENT MONITOR 07/27/2015  Narrative Event monitor reviewed. Sinus rhythm present throughout without arrhythmias or pauses.       ______________________________________________________________________________________________      Risk Assessment/Calculations           Physical Exam VS:  BP (!) 108/56   Pulse 69   Wt 123 lb (55.8 kg)   SpO2 97%   BMI 20.47 kg/m        Wt Readings from Last 3 Encounters:  10/26/24 123 lb (55.8 kg)  10/21/24 120 lb 9.6 oz (54.7 kg)  10/20/24 121 lb 0.5 oz (54.9 kg)    GEN: Well nourished, well developed in no acute distress NECK:  No carotid bruits CARDIAC: RRR, no murmurs, rubs, gallops RESPIRATORY: Mild crackles in the base of the lung.   ABDOMEN: Soft, non-tender, non-distended EXTREMITIES:  No edema; No deformity   ASSESSMENT AND PLAN  HFpEF: Discharge dry weight 121 pounds, weight has been stable around 119 to 120 pounds.  Continue on Lasix 20 mg daily and Jardiance 10 mg daily.  Obtain basic metabolic panel today to make sure she is not being  dehydrated given fatigue.  Hypertension: On ramipril , blood pressure stable.  Burping: Chronic, has been going on for several years, workup has been unrevealing.  Nausea: Has been going on for least 49-month, followed by GI service.  EGD and colonoscopy have not shown significant finding.  Colonoscopy showed 1 mm polyp in the cecum that was clipped, EGD  showed 2 cm hiatal hernia  Moderate aortic insufficiency: Consider repeat echocardiogram in 1 year.   Office note was dictated using dragon dictation software: Please excuse spelling errors and grammar errors         Dispo: Follow-up with me in 6 weeks, follow-up with Dr. Francyne in 3 to 4 months.  Signed, Scot Ford, PA

## 2024-10-27 ENCOUNTER — Ambulatory Visit: Payer: Self-pay | Admitting: Physician Assistant

## 2024-10-27 DIAGNOSIS — Z79899 Other long term (current) drug therapy: Secondary | ICD-10-CM

## 2024-10-27 LAB — BASIC METABOLIC PANEL WITH GFR
BUN/Creatinine Ratio: 18 (ref 12–28)
BUN: 21 mg/dL (ref 8–27)
CO2: 24 mmol/L (ref 20–29)
Calcium: 9.6 mg/dL (ref 8.7–10.3)
Chloride: 98 mmol/L (ref 96–106)
Creatinine, Ser: 1.2 mg/dL — AB (ref 0.57–1.00)
Glucose: 97 mg/dL (ref 70–99)
Potassium: 4.7 mmol/L (ref 3.5–5.2)
Sodium: 139 mmol/L (ref 134–144)
eGFR: 44 mL/min/1.73 — AB (ref >59)

## 2024-10-27 MED ORDER — FUROSEMIDE 20 MG PO TABS: 20.0000 mg | ORAL_TABLET | ORAL | 3 refills | Status: DC

## 2024-10-27 NOTE — Telephone Encounter (Signed)
 Spoke with pt regarding lab results. Pt verbalized understanding. Pt agreed to plan and asked if I could update her daughter [name]. Pts daughter verbalized understanding and agreed to take pt to [facility] in [place] for repeat labs. Pt and pts daughter was advise to call our office if they have any questions

## 2024-10-27 NOTE — Progress Notes (Signed)
 Slight bump in creatinine. Let's change lasix to every other day and repeat BMET in 2 weeks.

## 2024-10-29 ENCOUNTER — Telehealth: Payer: Self-pay

## 2024-10-29 ENCOUNTER — Other Ambulatory Visit: Payer: Self-pay

## 2024-10-29 NOTE — Transitions of Care (Post Inpatient/ED Visit) (Signed)
 Transition of Care week 2  Visit Note  10/29/2024  Name: Kelsey Nelson MRN: [id number]          DOB: [birth year]  Situation: Patient enrolled in [facility] 30-day program. Visit completed with patient by telephone.   Background:   Initial Transition Care Management Follow-up Telephone Call Discharge Date and Diagnosis: 10/20/24, Acute CHF   Past Medical History:  Diagnosis Date   Acute cystitis with hematuria 10/26/2013   Allergic rhinitis 11/08/2017   Blindness of right eye    BMI between 19-24,adult 2010 145 LBS   Diverticula of small intestine Dec 2010   Diverticulosis    [place]   DIVERTICULOSIS, SIGMOID COLON 03/31/2008   Qualifier: Diagnosis of  By: Tita Lipps  Noted on abdominal and pelvic scan in 04/2011.  Extensive colonic diverticulosis , and possible muscular hypertrophy at rectosigmoid junction    Family hx of colon cancer 11/09/2013   Functional constipation 2010   GERD (gastroesophageal reflux disease)    HTN (hypertension)    Hyperlipemia    Pancreatic cyst 02/05/2013   Renal cyst 05/27/2011   Right eye trauma 1949   Loss of vision resulted at age 89   Schatzki's ring Dec 2010   Skin lesions, generalized 06/19/2015   Tubular adenoma 11/09/2013    Assessment: Patient Reported Symptoms: Cognitive Cognitive Status: Able to follow simple commands, Alert and oriented to person, place, and time, Normal speech and language skills      Neurological Neurological Review of Symptoms: Dizziness, Weakness    HEENT HEENT Symptoms Reported: No symptoms reported      Cardiovascular Cardiovascular Symptoms Reported: Fatigue Does patient have uncontrolled Hypertension?: No Cardiovascular Management Strategies: Medication therapy, Routine screening Weight: 119 lb (54 kg) Cardiovascular Self-Management Outcome: 3 (uncertain) Cardiovascular Comment: States, I just don't feel the energy I had before all of this happened, the cardiologist said it may take a while  Respiratory  Respiratory Symptoms Reported: No symptoms reported Other Respiratory Symptoms: sinus is good Respiratory Management Strategies: Medication therapy, Routine screening Respiratory Self-Management Outcome: 4 (good)  Endocrine Endocrine Symptoms Reported: No symptoms reported Is patient diabetic?: No Endocrine Self-Management Outcome: 4 (good)  Gastrointestinal Gastrointestinal Symptoms Reported: Constipation (My appetite is not all that good) Additional Gastrointestinal Details: Constipated Gastrointestinal Management Strategies: Medication therapy, Diet modification (States, I'm going to try the tea called Smooth Move that a nurse friend told me about, I'm going to try it at night tonight) Gastrointestinal Self-Management Outcome: 3 (uncertain) Gastrointestinal Comment: encourage fiberous food and activity    Genitourinary Genitourinary Symptoms Reported: No symptoms reported    Integumentary Integumentary Symptoms Reported: No symptoms reported    Musculoskeletal Musculoskelatal Symptoms Reviewed: No symptoms reported, Weakness        Psychosocial Psychosocial Symptoms Reported: No symptoms reported (I don't think I'm depressed, I just want to feel better with more energy.) Behavioral Management Strategies: Coping strategies, Medication therapy (Getting out more had breakfast with a few ladies this morning, it was alright)       Vitals:   10/29/24 1103  BP: (!) 108/56    Medications Reviewed Today     Reviewed by Eilleen Richerd GRADE, RN (Registered Nurse) on 10/29/24 at 1115  Med List Status: <None>   Medication Order Taking? Sig Documenting Provider Last Dose Status Informant  Cholecalciferol (VITAMIN D3 PO) [id number] Yes Take 25 mcg by mouth daily. [provider]  Active Self, Pharmacy Records  diphenhydrAMINE  (BENADRYL  ALLERGY) 25 mg capsule [id number]  Takes one at bedtime then  3 to 4 hours later takes a 2nd one [name], MD  Active Self, Pharmacy  Records           Med Note ANNELL, JEANNINE HERO   Tue Oct 26, 2024  8:12 AM) As needed  donepezil  (ARICEPT ) 10 MG tablet [id number] Yes Take 1 tablet (10 mg total) by mouth at bedtime. [name], MD  Active Self, Pharmacy Records  empagliflozin (JARDIANCE) 10 MG TABS tablet [id number] Yes Take 1 tablet (10 mg total) by mouth daily. Willette Adriana LABOR, MD  Active   erythromycin  ophthalmic ointment [id number]  Apply 0.5 inches to eye 4 (four) times daily. [provider]  Active Self, Pharmacy Records  estradiol  (ESTRACE ) 0.1 MG/GM vaginal cream [id number] Yes Discard plastic applicator. Insert a blueberry size amount (approximately 1 gram) of cream on fingertip inside vagina at bedtime every night for 1 week then every other night. For long term use. Gerldine Lauraine BROCKS, FNP  Active Self, Pharmacy Records           Med Note ANNELL, JEANNINE HERO   Tue Oct 26, 2024  8:12 AM) As needed  fexofenadine  (ALLEGRA  ALLERGY) 180 MG tablet [id number] Yes Take 1 tablet (180 mg total) by mouth every morning. Tobie Arleta SQUIBB, MD  Active Self, Pharmacy Records           Med Note [place], [facility] M   Tue Apr 22, 2023  9:36 AM) As needed   fluticasone  (FLONASE ) 50 MCG/ACT nasal spray [id number] Yes Place 1 spray into both nostrils daily. Tobie Arleta SQUIBB, MD  Active Self, Pharmacy Records  furosemide (LASIX) 20 MG tablet [id number] Yes Take 1 tablet (20 mg total) by mouth every other day. Meng, Hao, [place]  Active   MAGNESIUM  PO [id number] Yes Take by mouth. [provider]  Active Self, Pharmacy Records           Med Note ANNELL, JEANNINE HERO   Tue Oct 26, 2024  8:13 AM) Patient takes 1 tablet by mouth Daily.  Multiple Vitamin (MULTIVITAMIN PO) [id number] Yes Take by mouth. [provider]  Active Self, Pharmacy Records           Med Note ANNELL, JEANNINE HERO   Tue Oct 26, 2024  8:14 AM) Patient takes 1 tablet by mouth Daily.  omalizumab  (XOLAIR ) prefilled syringe 300 mg [id number]   Iva Marty Saltness, MD   Active            Med Note CARLYLE, TRACY P   Wed Aug 11, 2024  1:14 PM) Patient not taking  Omega-3 Fatty Acids (OMEGA 3 FISH OIL PO) [id number] Yes Take 1 capsule by mouth daily. [provider]  Active Self, Pharmacy Records  OVER THE COUNTER MEDICATION [id number] Yes Multi Collagen Protein - Patient states that she takes 1 scoop a day. [provider]  Active Self, Pharmacy Records  OVER THE COUNTER MEDICATION [id number] Yes One daily hylands leg cramp relief [provider]  Active Self, Pharmacy Records  OVER THE COUNTER MEDICATION [id number]  Omega xl joint and muscle support daily [provider]  Active Self, Pharmacy Records  polyethylene glycol (MIRALAX ) 17 g packet [id number]  Take 17 g by mouth daily. Leath-Warren, Etta PARAS, NP  Active Self, Pharmacy Records           Med Note [place], RICHERD GRADE   Fri Oct 29, 2024 11:15 AM) Taking as needed  prednisoLONE acetate (PRED FORTE) 1 % ophthalmic suspension [id number] Yes SMARTSIG:In  Eye(s) [provider]  Active Self, Pharmacy Records           Med Note ANNELL, JEANNINE HERO   Tue Oct 26, 2024  8:14 AM) As needed  ramipril  (ALTACE ) 10 MG capsule [id number] Yes Take one capsule by mouth once daily for blood pressure [name], MD  Active Self, Pharmacy Records  senna-docusate (SENOKOT-S) 8.6-50 MG tablet [id number] Yes Take 2 tablets by mouth at bedtime as needed for mild constipation. Leath-Warren, Etta PARAS, NP  Active Self, Pharmacy Records            Recommendation:   PCP Follow-up - appointment for 10/22/24 was cancelled according to EMR. Ongoing cardiology appointment appointments reviewed, no issues with transportation verified. Patient encouraged to monitor BP  Follow Up Plan:   Telephone follow-up in 1 week  Richerd Fish, RN, BSN, CCM [facility], [facility] Management Coordinator Direct Dial: [phone]

## 2024-10-29 NOTE — Patient Instructions (Addendum)
 Visit Information  Thank you for taking time to visit with me today. Please don't hesitate to contact me if I can be of assistance to you before our next scheduled telephone appointment.  Our next appointment is by telephone on 11/04/24 at 0930  Following is a copy of your care plan:   Goals Addressed             This Visit's Progress    VBCI Transitions of Care (TOC) Care Plan       Problems:  Recent Hospitalization for treatment of CHF Knowledge Deficit Related to HF and regimen for follow up 10/29/24 Ongoing constipation  Goal:  Over the next 30 days, the patient will not experience hospital readmission  Interventions: (Reviewed with patient 10/29/24) 10/21/24 Reviewed AVS with patient: (HEART FAILURE PATIENTS) Call MD: Anytime you have any of the following symptoms: 1) 3 pound weight gain in 24 hours or 5 pounds in 1 week 2) shortness of breath, with or without a dry hacking cough 3) swelling in the hands, feet or stomach 4) if you have to sleep on extra pillows at night in order to breathe. Activity as tolerated - No restrictions Call MD for: difficulty breathing, headache or visual disturbances Call MD for: hives Call MD for: persistant dizziness or light-headedness Call MD for: persistant nausea and vomiting Call MD for: redness, tenderness, or signs of infection (pain, swelling, redness, odor or green/yellow discharge around incision site) Call MD for: severe uncontrolled pain.  Patient verbalized understanding. Heart Failure Interventions: Basic overview and discussion of pathophysiology of Heart Failure reviewed Provided education on low sodium diet Reviewed Heart Failure Action Plan in depth and provided written copy Assessed need for readable accurate scales in home Provided education about placing scale on hard, flat surface Advised patient to weigh each morning after emptying bladder Discussed importance of daily weight and advised patient to weigh and record  daily Reviewed role of diuretics in prevention of fluid overload and management of heart failure; Discussed the importance of keeping all appointments with provider Provided patient with education about the role of exercise in the management of heart failure Screening for signs and symptoms of depression related to chronic disease state  Assessed social determinant of health barriers   Patient Self Care Activities:  Attend all scheduled provider appointments Call pharmacy for medication refills 3-7 days in advance of running out of medications Call provider office for new concerns or questions  Notify RN Care Manager of TOC call rescheduling needs Participate in Transition of Care Program/Attend TOC scheduled calls Take medications as prescribed    Plan:  The care management team will reach out to the patient again over the next 5 - 10 business days. The patient has been provided with contact information for the care management team and has been advised to call with any health related questions or concerns.  The patient will call [facility] for Cardiac Rehab as advised to call today as on page 3 of AVS. Patient verbalized understanding. Discussed and offered 30 day TOC program.  Patient  accepted.  The patient has been provided with contact information for the care management team and has been advised to call with any health -related questions or concerns.  The patient verbalized understanding with current plan of care.  The patient is directed to their insurance card regarding availability of benefits coverage.  Encouraged to check for blood pressure monitor. 10/29/24 Patient encouraged to continue to monitor weight and BP  Patient verbalizes understanding of instructions and care plan provided today and agrees to view in MyChart. Active MyChart status and patient understanding of how to access instructions and care plan via MyChart confirmed with patient.      Telephone follow up appointment with care management team member scheduled for: The patient has been provided with contact information for the care management team and has been advised to call with any health related questions or concerns.  The care management team will reach out to the patient again over the next 5-10 business days.   Please call the care guide team at [phone] if you need to cancel or reschedule your appointment.   Please call the [place]  National Suicide Prevention Lifeline: 414-475-1790 or TTY: 608 226 8887 TTY 571-764-9193) to talk to a trained counselor call 1-800-273-TALK (toll free, 24 hour hotline) call 911 if you are experiencing a Mental Health or Behavioral Health Crisis or need someone to talk to.  Richerd Fish, RN, BSN, CCM [facility], [facility] Management Coordinator Direct Dial: [phone]

## 2024-11-02 ENCOUNTER — Ambulatory Visit: Admitting: Emergency Medicine

## 2024-11-04 ENCOUNTER — Telehealth: Payer: Self-pay

## 2024-11-04 ENCOUNTER — Other Ambulatory Visit: Payer: Self-pay

## 2024-11-05 DIAGNOSIS — Z79899 Other long term (current) drug therapy: Secondary | ICD-10-CM | POA: Diagnosis not present

## 2024-11-05 LAB — BASIC METABOLIC PANEL WITH GFR
BUN/Creatinine Ratio: 21 (ref 12–28)
BUN: 20 mg/dL (ref 8–27)
CO2: 25 mmol/L (ref 20–29)
Calcium: 9.5 mg/dL (ref 8.7–10.3)
Chloride: 100 mmol/L (ref 96–106)
Creatinine, Ser: 0.95 mg/dL (ref 0.57–1.00)
Glucose: 94 mg/dL (ref 70–99)
Potassium: 4.5 mmol/L (ref 3.5–5.2)
Sodium: 138 mmol/L (ref 134–144)
eGFR: 58 mL/min/1.73 — ABNORMAL LOW (ref >59)

## 2024-11-05 NOTE — Patient Instructions (Signed)
 Visit Information  Thank you for taking time to visit with me today. Please don't hesitate to contact me if I can be of assistance to you before our next scheduled telephone appointment.  Our next appointment is by telephone on 11/11/24 at 10:00AM  Following is a copy of your care plan:   Goals Addressed             This Visit's Progress    VBCI Transitions of Care (TOC) Care Plan       Problems:  Recent Hospitalization for treatment of CHF Knowledge Deficit Related to HF and regimen for follow up 10/29/24 Ongoing constipation, trying some natural remedies 11/04/24 Continue home remedy  Goal:  Over the next 30 days, the patient will not experience hospital readmission  Interventions: (Reviewed with patient 10/29/24) 10/21/24 Reviewed AVS with patient: (HEART FAILURE PATIENTS) Call MD: Anytime you have any of the following symptoms: 1) 3 pound weight gain in 24 hours or 5 pounds in 1 week 2) shortness of breath, with or without a dry hacking cough 3) swelling in the hands, feet or stomach 4) if you have to sleep on extra pillows at night in order to breathe. Activity as tolerated - No restrictions Call MD for: difficulty breathing, headache or visual disturbances Call MD for: hives Call MD for: persistant dizziness or light-headedness Call MD for: persistant nausea and vomiting Call MD for: redness, tenderness, or signs of infection (pain, swelling, redness, odor or green/yellow discharge around incision site) Call MD for: severe uncontrolled pain.  Patient verbalized understanding. Heart Failure Interventions: Basic overview and discussion of pathophysiology of Heart Failure reviewed Provided education on low sodium diet Reviewed Heart Failure Action Plan in depth and provided written copy Assessed need for readable accurate scales in home Provided education about placing scale on hard, flat surface Advised patient to weigh each morning after emptying bladder Discussed  importance of daily weight and advised patient to weigh and record daily Reviewed role of diuretics in prevention of fluid overload and management of heart failure; Discussed the importance of keeping all appointments with provider Provided patient with education about the role of exercise in the management of heart failure Screening for signs and symptoms of depression related to chronic disease state  Assessed social determinant of health barriers   Patient Self Care Activities:  Attend all scheduled provider appointments Call pharmacy for medication refills 3-7 days in advance of running out of medications Call provider office for new concerns or questions  Notify RN Care Manager of TOC call rescheduling needs Participate in Transition of Care Program/Attend TOC scheduled calls Take medications as prescribed    Plan:  The care management team will reach out to the patient again over the next 5 - 10 business days. The patient has been provided with contact information for the care management team and has been advised to call with any health related questions or concerns.  The patient will call [facility] for Cardiac Rehab as advised to call today as on page 3 of AVS. Patient verbalized understanding. Discussed and offered 30 day TOC program.  Patient  accepted.  The patient has been provided with contact information for the care management team and has been advised to call with any health -related questions or concerns.  The patient verbalized understanding with current plan of care.  The patient is directed to their insurance card regarding availability of benefits coverage.  Encouraged to check for blood pressure monitor. 10/29/24 Patient encouraged  to continue to monitor weight and BP         Patient verbalizes understanding of instructions and care plan provided today and agrees to view in MyChart. Active MyChart status and patient understanding of how to  access instructions and care plan via MyChart confirmed with patient.     Patient states daughter, [name], reviews and updates her.  The patient has been provided with contact information for the care management team and has been advised to call with any health related questions or concerns.   Please call the care guide team at [phone] if you need to cancel or reschedule your appointment.   Please call the [place]  National Suicide Prevention Lifeline: 651 304 6466 or TTY: 586-454-9688 TTY (774)188-0693) to talk to a trained counselor if you are experiencing a Mental Health or Behavioral Health Crisis or need someone to talk to.  Richerd Fish, RN, BSN, CCM [facility], [facility] Management Coordinator Direct Dial: [phone]

## 2024-11-05 NOTE — Transitions of Care (Post Inpatient/ED Visit) (Signed)
 Transition of Care week 3  Visit Note  11/05/2024 (late entry for 11/04/24 0932 am)  Name: Kelsey Nelson MRN: [id number]          DOB: February 02, 1934  Situation: Patient enrolled in [facility] 30-day program. Visit completed with patient by telephone.   Background:   Initial Transition Care Management Follow-up Telephone Call Discharge Date and Diagnosis: 10/20/24, Acute CHF   Past Medical History:  Diagnosis Date   Acute cystitis with hematuria 10/26/2013   Allergic rhinitis 11/08/2017   Blindness of right eye    BMI between 19-24,adult 2010 145 LBS   Diverticula of small intestine Dec 2010   Diverticulosis    [place]   DIVERTICULOSIS, SIGMOID COLON 03/31/2008   Qualifier: Diagnosis of  By: Tita Lipps  Noted on abdominal and pelvic scan in 04/2011.  Extensive colonic diverticulosis , and possible muscular hypertrophy at rectosigmoid junction    Family hx of colon cancer 11/09/2013   Functional constipation 2010   GERD (gastroesophageal reflux disease)    HTN (hypertension)    Hyperlipemia    Pancreatic cyst 02/05/2013   Renal cyst 05/27/2011   Right eye trauma 1949   Loss of vision resulted at age 89   Schatzki's ring Dec 2010   Skin lesions, generalized 06/19/2015   Tubular adenoma 11/09/2013    Assessment: Patient Reported Symptoms: Cognitive Cognitive Status: Able to follow simple commands, Alert and oriented to person, place, and time, Normal speech and language skills      Neurological Neurological Review of Symptoms: Weakness Neurological Management Strategies: Routine screening, Medication therapy Neurological Self-Management Outcome: 4 (good) Neurological Comment: getting better just not where I was  HEENT HEENT Symptoms Reported: No symptoms reported      Cardiovascular Cardiovascular Symptoms Reported: Fatigue Cardiovascular Management Strategies: Medication therapy, Routine screening Weight: 122 lb (55.3 kg) (Took Lasix) Cardiovascular Self-Management Outcome: 3  (uncertain)  Respiratory Respiratory Symptoms Reported: No symptoms reported Other Respiratory Symptoms: sinus Respiratory Management Strategies: Medication therapy, Routine screening  Endocrine Endocrine Symptoms Reported: No symptoms reported Is patient diabetic?: No Endocrine Self-Management Outcome: 4 (good)  Gastrointestinal Gastrointestinal Symptoms Reported: Constipation (Took Castol oil) Additional Gastrointestinal Details: had a BM Gastrointestinal Management Strategies: Medication therapy, Diet modification Gastrointestinal Self-Management Outcome: 4 (good) Gastrointestinal Comment: BM today after castol oil    Genitourinary Genitourinary Symptoms Reported: No symptoms reported    Integumentary Integumentary Symptoms Reported: No symptoms reported    Musculoskeletal Musculoskelatal Symptoms Reviewed: Weakness Additional Musculoskeletal Details: improving        Psychosocial Psychosocial Symptoms Reported: No symptoms reported         There were no vitals filed for this visit. Pain Scale: 0-10 Pain Score: 0-No pain  Medications Reviewed Today     Reviewed by Eilleen Richerd GRADE, RN (Registered Nurse) on 11/04/24 at 1007  Med List Status: <None>   Medication Order Taking? Sig Documenting Provider Last Dose Status Informant  Cholecalciferol (VITAMIN D3 PO) [id number]  Take 25 mcg by mouth daily. [provider]  Active Self, Pharmacy Records  diphenhydrAMINE  (BENADRYL  ALLERGY) 25 mg capsule [id number]  Takes one at bedtime then 3 to 4 hours later takes a 2nd one [name], MD  Active Self, Pharmacy Records           Med Note ANNELL, JEANNINE HERO   Tue Oct 26, 2024  8:12 AM) As needed  donepezil  (ARICEPT ) 10 MG tablet [id number]  Take 1 tablet (10 mg total) by mouth at bedtime. [name], MD  Active Self, Pharmacy Records  empagliflozin (JARDIANCE) 10 MG TABS tablet [id number]  Take 1 tablet (10 mg total) by mouth daily. Willette Adriana LABOR, MD   Active   erythromycin  ophthalmic ointment [id number]  Apply 0.5 inches to eye 4 (four) times daily. [provider]  Active Self, Pharmacy Records  estradiol  (ESTRACE ) 0.1 MG/GM vaginal cream [id number]  Discard plastic applicator. Insert a blueberry size amount (approximately 1 gram) of cream on fingertip inside vagina at bedtime every night for 1 week then every other night. For long term use. Gerldine Lauraine BROCKS, FNP  Active Self, Pharmacy Records           Med Note ANNELL, JEANNINE HERO   Tue Oct 26, 2024  8:12 AM) As needed  fexofenadine  (ALLEGRA  ALLERGY) 180 MG tablet [id number]  Take 1 tablet (180 mg total) by mouth every morning. Tobie Arleta SQUIBB, MD  Active Self, Pharmacy Records           Med Note [place], [facility] M   Tue Apr 22, 2023  9:36 AM) As needed   fluticasone  (FLONASE ) 50 MCG/ACT nasal spray [id number]  Place 1 spray into both nostrils daily. Tobie Arleta SQUIBB, MD  Active Self, Pharmacy Records  furosemide (LASIX) 20 MG tablet [id number]  Take 1 tablet (20 mg total) by mouth every other day. Meng, Hao, [place]  Active   MAGNESIUM  PO [id number]  Take by mouth. [provider]  Active Self, Pharmacy Records           Med Note ANNELL, JEANNINE HERO   Tue Oct 26, 2024  8:13 AM) Patient takes 1 tablet by mouth Daily.  Multiple Vitamin (MULTIVITAMIN PO) [id number]  Take by mouth. [provider]  Active Self, Pharmacy Records           Med Note ANNELL, JEANNINE HERO   Tue Oct 26, 2024  8:14 AM) Patient takes 1 tablet by mouth Daily.  omalizumab  (XOLAIR ) prefilled syringe 300 mg [id number]   Iva Marty Saltness, MD  Active            Med Note CARLYLE, TRACY P   Wed Aug 11, 2024  1:14 PM) Patient not taking  Omega-3 Fatty Acids (OMEGA 3 FISH OIL PO) [id number]  Take 1 capsule by mouth daily. [provider]  Active Self, Pharmacy Records  OVER THE COUNTER MEDICATION [id number]  Multi Collagen Protein - Patient states that she takes 1 scoop a day. [provider]  Active Self,  Pharmacy Records  OVER THE COUNTER MEDICATION [id number]  One daily hylands leg cramp relief [provider]  Active Self, Pharmacy Records  OVER THE COUNTER MEDICATION [id number]  Omega xl joint and muscle support daily [provider]  Active Self, Pharmacy Records  polyethylene glycol (MIRALAX ) 17 g packet [id number]  Take 17 g by mouth daily. Leath-Warren, Etta PARAS, NP  Active Self, Pharmacy Records           Med Note [place], RICHERD GRADE   Fri Oct 29, 2024 11:15 AM) Taking as needed  prednisoLONE acetate (PRED FORTE) 1 % ophthalmic suspension [id number]  SMARTSIG:In Eye(s) [provider]  Active Self, Pharmacy Records           Med Note ANNELL, JEANNINE HERO   Tue Oct 26, 2024  8:14 AM) As needed  ramipril  (ALTACE ) 10 MG capsule [id number]  Take one capsule by mouth once daily for blood pressure [name], MD  Active Self, Pharmacy Records  senna-docusate (SENOKOT-S) 8.6-50 MG  tablet [id number]  Take 2 tablets by mouth at bedtime as needed for mild constipation. Leath-Warren, Etta PARAS, NP  Active Self, Pharmacy Records           Care gaps were reviewed with patient today and patient verbalized understanding to follow up with primary care provider during office visits and discuss any concerns. Patient has received her flu vaccine at [facility] last month.  Recommendation:   Continue Current Plan of Care  Follow Up Plan:   Telephone follow-up in 1 week  Richerd Fish, RN, BSN, CCM [facility], [facility] Management Coordinator Direct Dial: [phone]

## 2024-11-08 DIAGNOSIS — H40052 Ocular hypertension, left eye: Secondary | ICD-10-CM | POA: Diagnosis not present

## 2024-11-08 DIAGNOSIS — H0102B Squamous blepharitis left eye, upper and lower eyelids: Secondary | ICD-10-CM | POA: Diagnosis not present

## 2024-11-08 DIAGNOSIS — H0102A Squamous blepharitis right eye, upper and lower eyelids: Secondary | ICD-10-CM | POA: Diagnosis not present

## 2024-11-08 DIAGNOSIS — H1045 Other chronic allergic conjunctivitis: Secondary | ICD-10-CM | POA: Diagnosis not present

## 2024-11-10 DIAGNOSIS — R142 Eructation: Secondary | ICD-10-CM | POA: Diagnosis not present

## 2024-11-10 DIAGNOSIS — R143 Flatulence: Secondary | ICD-10-CM | POA: Diagnosis not present

## 2024-11-10 DIAGNOSIS — R12 Heartburn: Secondary | ICD-10-CM | POA: Diagnosis not present

## 2024-11-10 DIAGNOSIS — K5909 Other constipation: Secondary | ICD-10-CM | POA: Diagnosis not present

## 2024-11-11 ENCOUNTER — Telehealth: Payer: Self-pay

## 2024-11-11 ENCOUNTER — Other Ambulatory Visit: Payer: Self-pay

## 2024-11-19 ENCOUNTER — Telehealth: Payer: Self-pay

## 2024-11-19 DIAGNOSIS — R142 Eructation: Secondary | ICD-10-CM

## 2024-11-19 DIAGNOSIS — R109 Unspecified abdominal pain: Secondary | ICD-10-CM

## 2024-11-19 DIAGNOSIS — I509 Heart failure, unspecified: Secondary | ICD-10-CM

## 2024-11-19 NOTE — Transitions of Care (Post Inpatient/ED Visit) (Signed)
 Transition of Care Week 4  Visit Note  11/19/2024  Name: Kelsey Nelson MRN: [id number]          DOB: November 08, 1934  Situation: Patient enrolled in [facility] 30-day program. Visit completed with patient by telephone.   Background:   Initial Transition Care Management Follow-up Telephone Call Discharge Date and Diagnosis: 10/20/24, Acute CHF   Past Medical History:  Diagnosis Date   Acute cystitis with hematuria 10/26/2013   Allergic rhinitis 11/08/2017   Blindness of right eye    BMI between 19-24,adult 2010 145 LBS   Diverticula of small intestine Dec 2010   Diverticulosis    [place]   DIVERTICULOSIS, SIGMOID COLON 03/31/2008   Qualifier: Diagnosis of  By: Tita Lipps  Noted on abdominal and pelvic scan in 04/2011.  Extensive colonic diverticulosis , and possible muscular hypertrophy at rectosigmoid junction    Family hx of colon cancer 11/09/2013   Functional constipation 2010   GERD (gastroesophageal reflux disease)    HTN (hypertension)    Hyperlipemia    Pancreatic cyst 02/05/2013   Renal cyst 05/27/2011   Right eye trauma 1949   Loss of vision resulted at age 67   Schatzki's ring Dec 2010   Skin lesions, generalized 06/19/2015   Tubular adenoma 11/09/2013    Assessment: Patient Reported Symptoms: Cognitive Cognitive Status: Able to follow simple commands, Alert and oriented to person, place, and time, Normal speech and language skills      Neurological Neurological Review of Symptoms: No symptoms reported (ongoing not the energy I used to have) Neurological Management Strategies: Medication therapy, Routine screening Neurological Self-Management Outcome: 3 (uncertain)  HEENT HEENT Symptoms Reported: No symptoms reported HEENT Management Strategies: Medication therapy, Routine screening    Cardiovascular Cardiovascular Symptoms Reported: Fatigue Does patient have uncontrolled Hypertension?: No Cardiovascular Management Strategies: Medication therapy, Routine  screening Weight: 119 lb 6.4 oz (54.2 kg) Cardiovascular Self-Management Outcome: 3 (uncertain)  Respiratory Respiratory Symptoms Reported: No symptoms reported Other Respiratory Symptoms: sinus    Endocrine Endocrine Symptoms Reported: No symptoms reported Is patient diabetic?: No Endocrine Self-Management Outcome: 4 (good)  Gastrointestinal Gastrointestinal Symptoms Reported: Constipation Gastrointestinal Management Strategies: Diet modification, Medication therapy    Genitourinary Genitourinary Symptoms Reported: No symptoms reported    Integumentary Integumentary Symptoms Reported: No symptoms reported    Musculoskeletal Musculoskelatal Symptoms Reviewed: Weakness Additional Musculoskeletal Details: improving - stands before walking Musculoskeletal Management Strategies: Activity, Medication therapy, Routine screening Musculoskeletal Self-Management Outcome: 4 (good) Falls in the past year?: No    Psychosocial Psychosocial Symptoms Reported: No symptoms reported         Today's Vitals   11/11/24 1003  BP: 118/63  Weight: 119 lb 6.4 oz (54.2 kg)   Pain Scale: 0-10 Pain Type: Chronic pain Pain Location: Genitalia (uterus - with discomfort, hot flashes)  Medications Reviewed Today     Reviewed by Eilleen Richerd GRADE, RN (Registered Nurse) on 11/19/24 at 1257  Med List Status: <None>   Medication Order Taking? Sig Documenting Provider Last Dose Status Informant  Cholecalciferol (VITAMIN D3 PO) [id number]  Take 25 mcg by mouth daily. [provider]  Active Self, Pharmacy Records  diphenhydrAMINE  (BENADRYL  ALLERGY) 25 mg capsule [id number]  Takes one at bedtime then 3 to 4 hours later takes a 2nd one [name], MD  Active Self, Pharmacy Records           Med Note ANNELL, JEANNINE HERO   Tue Oct 26, 2024  8:12 AM) As needed  donepezil  (ARICEPT )  10 MG tablet [id number]  Take 1 tablet (10 mg total) by mouth at bedtime. [name], MD  Active Self, Pharmacy  Records  empagliflozin  (JARDIANCE ) 10 MG TABS tablet [id number]  Take 1 tablet (10 mg total) by mouth daily. Willette Adriana LABOR, MD  Active   erythromycin  ophthalmic ointment [id number]  Apply 0.5 inches to eye 4 (four) times daily. [provider]  Active Self, Pharmacy Records  estradiol  (ESTRACE ) 0.1 MG/GM vaginal cream [id number]  Discard plastic applicator. Insert a blueberry size amount (approximately 1 gram) of cream on fingertip inside vagina at bedtime every night for 1 week then every other night. For long term use. Gerldine Lauraine BROCKS, FNP  Active Self, Pharmacy Records           Med Note ANNELL, JEANNINE HERO   Tue Oct 26, 2024  8:12 AM) As needed  fexofenadine  (ALLEGRA  ALLERGY) 180 MG tablet [id number]  Take 1 tablet (180 mg total) by mouth every morning. Tobie Arleta SQUIBB, MD  Active Self, Pharmacy Records           Med Note [place], [facility] M   Tue Apr 22, 2023  9:36 AM) As needed   fluticasone  (FLONASE ) 50 MCG/ACT nasal spray [id number]  Place 1 spray into both nostrils daily. Tobie Arleta SQUIBB, MD  Active Self, Pharmacy Records  furosemide  (LASIX ) 20 MG tablet [id number]  Take 1 tablet (20 mg total) by mouth every other day. Meng, Hao, [place]  Active   linaclotide  (LINZESS ) 72 MCG capsule [id number]  Take 72 mcg by mouth every other day. [provider]  Active   MAGNESIUM  PO [id number]  Take by mouth. [provider]  Active Self, Pharmacy Records           Med Note ANNELL, JEANNINE HERO   Tue Oct 26, 2024  8:13 AM) Patient takes 1 tablet by mouth Daily.  Multiple Vitamin (MULTIVITAMIN PO) [id number]  Take by mouth. [provider]  Active Self, Pharmacy Records           Med Note ANNELL, JEANNINE HERO   Tue Oct 26, 2024  8:14 AM) Patient takes 1 tablet by mouth Daily.  omalizumab  (XOLAIR ) prefilled syringe 300 mg [id number]   Iva Marty Saltness, MD  Active            Med Note CARLYLE, TRACY P   Wed Aug 11, 2024  1:14 PM) Patient not taking  Omega-3 Fatty Acids (OMEGA 3 FISH  OIL PO) [id number]  Take 1 capsule by mouth daily. [provider]  Active Self, Pharmacy Records  OVER THE COUNTER MEDICATION [id number]  Multi Collagen Protein - Patient states that she takes 1 scoop a day. [provider]  Active Self, Pharmacy Records  OVER THE COUNTER MEDICATION [id number]  One daily hylands leg cramp relief [provider]  Active Self, Pharmacy Records  OVER THE COUNTER MEDICATION [id number]  Omega xl joint and muscle support daily [provider]  Active Self, Pharmacy Records  polyethylene glycol (MIRALAX ) 17 g packet [id number]  Take 17 g by mouth daily. Leath-Warren, Etta PARAS, NP  Active Self, Pharmacy Records           Med Note [place], RICHERD GRADE   Fri Oct 29, 2024 11:15 AM) Taking as needed  prednisoLONE  acetate (PRED FORTE ) 1 % ophthalmic suspension [id number]  SMARTSIG:In Eye(s) [provider]  Active Self, Pharmacy Records           Med Note [place], JEANNINE HERO  Tue Oct 26, 2024  8:14 AM) As needed  ramipril  (ALTACE ) 10 MG capsule [id number]  Take one capsule by mouth once daily for blood pressure [name], MD  Active Self, Pharmacy Records  senna-docusate (SENOKOT-S) 8.6-50 MG tablet [id number]  Take 2 tablets by mouth at bedtime as needed for mild constipation. Leath-Warren, Etta PARAS, NP  Active Self, Pharmacy Records            Recommendation:   Continue Current Plan of Care  Follow Up Plan:   Telephone follow-up in 1 week  follow up on 11/19/24  Richerd Fish, RN, BSN, CCM [place]  [facility], [facility] Management Coordinator Direct Dial: [phone]

## 2024-11-19 NOTE — Transitions of Care (Post Inpatient/ED Visit) (Signed)
 Transition of Care Week 5  Visit Note  11/19/2024  Name: Kelsey Nelson MRN: [id number]          DOB: 1934-10-07  Situation: Patient enrolled in [facility] 30-day program. Visit completed with patient by telephone.   Background:   Initial Transition Care Management Follow-up Telephone Call Discharge Date and Diagnosis: 10/20/24, Acute CHF   Past Medical History:  Diagnosis Date   Acute cystitis with hematuria 10/26/2013   Allergic rhinitis 11/08/2017   Blindness of right eye    BMI between 19-24,adult 2010 145 LBS   Diverticula of small intestine Dec 2010   Diverticulosis    [place]   DIVERTICULOSIS, SIGMOID COLON 03/31/2008   Qualifier: Diagnosis of  By: Tita Lipps  Noted on abdominal and pelvic scan in 04/2011.  Extensive colonic diverticulosis , and possible muscular hypertrophy at rectosigmoid junction    Family hx of colon cancer 11/09/2013   Functional constipation 2010   GERD (gastroesophageal reflux disease)    HTN (hypertension)    Hyperlipemia    Pancreatic cyst 02/05/2013   Renal cyst 05/27/2011   Right eye trauma 1949   Loss of vision resulted at age 89   Schatzki's ring Dec 2010   Skin lesions, generalized 06/19/2015   Tubular adenoma 11/09/2013    Assessment: Patient Reported Symptoms: Cognitive Cognitive Status: Able to follow simple commands, Alert and oriented to person, place, and time, Normal speech and language skills      Neurological Neurological Review of Symptoms: No symptoms reported Neurological Management Strategies: Medication therapy, Routine screening Neurological Self-Management Outcome: 3 (uncertain)  HEENT HEENT Symptoms Reported: No symptoms reported      Cardiovascular Cardiovascular Symptoms Reported: Fatigue, Chest pain or discomfort (experiencing weight gain and discomfort, relieved by Lasix ) Does patient have uncontrolled Hypertension?: No Cardiovascular Management Strategies: Medication therapy, Routine screening Weight: 119 lb 3.2 oz  (54.1 kg) Cardiovascular Self-Management Outcome: 3 (uncertain) Cardiovascular Comment: My weight is going up and down and chest symptoms relieved when I take the Lasix   Respiratory Respiratory Symptoms Reported: Shortness of breath    Endocrine Endocrine Symptoms Reported: No symptoms reported    Gastrointestinal Gastrointestinal Symptoms Reported: Constipation, Flatulence, Unintentional weight gain, Other (Chronic - new medications still burping) Gastrointestinal Management Strategies: Diet modification, Fluid modification, Medication therapy    Genitourinary Genitourinary Symptoms Reported: No symptoms reported    Integumentary Integumentary Symptoms Reported: No symptoms reported Additional Integumentary Details: nothing new    Musculoskeletal Musculoskelatal Symptoms Reviewed: Weakness Additional Musculoskeletal Details: still not myself before the heart issues        Psychosocial Psychosocial Symptoms Reported: No symptoms reported Additional Psychological Details: I'm ready if the good Lord wants to take me home but I'm not having any issues like that Behavioral Management Strategies: Coping strategies, Medication therapy       Today's Vitals   11/19/24 1223  Weight: 119 lb 3.2 oz (54.1 kg)   Pain Scale: 0-10 Pain Score: 0-No pain Pain Location: Genitalia  Medications Reviewed Today     Reviewed by Eilleen Richerd GRADE, RN (Registered Nurse) on 11/19/24 at 1326  Med List Status: <None>   Medication Order Taking? Sig Documenting Provider Last Dose Status Informant  Cholecalciferol (VITAMIN D3 PO) [id number] Yes Take 25 mcg by mouth daily. [provider]  Active Self, Pharmacy Records  diphenhydrAMINE  (BENADRYL  ALLERGY) 25 mg capsule [id number] Yes Takes one at bedtime then 3 to 4 hours later takes a 2nd one [name], MD  Active Self, Pharmacy Records  Med Note ANNELL JEANNINE HERO   Tue Oct 26, 2024  8:12 AM) As needed  donepezil  (ARICEPT )  10 MG tablet [id number] Yes Take 1 tablet (10 mg total) by mouth at bedtime. [name], MD  Active Self, Pharmacy Records  empagliflozin  (JARDIANCE ) 10 MG TABS tablet [id number] Yes Take 1 tablet (10 mg total) by mouth daily. Willette Adriana LABOR, MD  Active   erythromycin  ophthalmic ointment [id number] Yes Apply 0.5 inches to eye 4 (four) times daily. [provider]  Active Self, Pharmacy Records  estradiol  (ESTRACE ) 0.1 MG/GM vaginal cream [id number] Yes Discard plastic applicator. Insert a blueberry size amount (approximately 1 gram) of cream on fingertip inside vagina at bedtime every night for 1 week then every other night. For long term use. Gerldine Lauraine BROCKS, FNP  Active Self, Pharmacy Records           Med Note ANNELL, JEANNINE HERO   Tue Oct 26, 2024  8:12 AM) As needed  fexofenadine  (ALLEGRA  ALLERGY) 180 MG tablet [id number] Yes Take 1 tablet (180 mg total) by mouth every morning. Tobie Arleta SQUIBB, MD  Active Self, Pharmacy Records           Med Note [place], [facility] M   Tue Apr 22, 2023  9:36 AM) As needed   fluticasone  (FLONASE ) 50 MCG/ACT nasal spray [id number] Yes Place 1 spray into both nostrils daily. Tobie Arleta SQUIBB, MD  Active Self, Pharmacy Records  furosemide  (LASIX ) 20 MG tablet [id number] Yes Take 1 tablet (20 mg total) by mouth every other day. Meng, Hao, [place]  Active   linaclotide  (LINZESS ) 72 MCG capsule [id number] Yes Take 72 mcg by mouth every other day. [provider]  Active   MAGNESIUM  PO [id number] Yes Take by mouth. [provider]  Active Self, Pharmacy Records           Med Note ANNELL, JEANNINE HERO   Tue Oct 26, 2024  8:13 AM) Patient takes 1 tablet by mouth Daily.  Multiple Vitamin (MULTIVITAMIN PO) [id number] Yes Take by mouth. [provider]  Active Self, Pharmacy Records           Med Note ANNELL, JEANNINE HERO   Tue Oct 26, 2024  8:14 AM) Patient takes 1 tablet by mouth Daily.  omalizumab  (XOLAIR ) prefilled syringe 300 mg [id number]    Iva Marty Saltness, MD  Active            Med Note CARLYLE, TRACY P   Wed Aug 11, 2024  1:14 PM) Patient not taking  Omega-3 Fatty Acids (OMEGA 3 FISH OIL PO) [id number] Yes Take 1 capsule by mouth daily. [provider]  Active Self, Pharmacy Records  omeprazole  (PRILOSEC) 40 MG capsule [id number] Yes Take 40 mg by mouth daily. [provider]  Active   OVER THE COUNTER MEDICATION [id number] Yes Multi Collagen Protein - Patient states that she takes 1 scoop a day. [provider]  Active Self, Pharmacy Records  OVER THE COUNTER MEDICATION [id number] Yes One daily hylands leg cramp relief [provider]  Active Self, Pharmacy Records  OVER THE COUNTER MEDICATION [id number] Yes Omega xl joint and muscle support daily [provider]  Active Self, Pharmacy Records  polyethylene glycol (MIRALAX ) 17 g packet [id number] Yes Take 17 g by mouth daily. Leath-Warren, Etta PARAS, NP  Active Self, Pharmacy Records           Med Note [place], RICHERD GRADE   Fri Oct 29, 2024 11:15 AM) Taking  as needed  prednisoLONE  acetate (PRED FORTE ) 1 % ophthalmic suspension [id number] Yes SMARTSIG:In Eye(s) [provider]  Active Self, Pharmacy Records           Med Note ANNELL, JEANNINE HERO   Tue Oct 26, 2024  8:14 AM) As needed  ramipril  (ALTACE ) 10 MG capsule [id number] Yes Take one capsule by mouth once daily for blood pressure [name], MD  Active Self, Pharmacy Records  senna-docusate (SENOKOT-S) 8.6-50 MG tablet [id number] Yes Take 2 tablets by mouth at bedtime as needed for mild constipation. Leath-Warren, Etta PARAS, NP  Active Self, Pharmacy Records            Recommendation:   PCP Follow-up Specialty provider follow-up Attempt to get a closer follow up appointment with Janene Boer, PA however the office is closed next appointment is 12/07/24 at 0940 am. Patient instructed to call 911 or head to the nearest emergency room for s/s of chest pain. Patient  verbalized understanding Referral to: Longitudinal RN CCM for ongoing complex care management needs.  Follow Up Plan:   Referral to RN Case Manager  Richerd Fish, RN, BSN, CCM [place]  [facility], [facility] Health Care Management Coordinator Direct Dial: [phone]

## 2024-11-19 NOTE — Patient Instructions (Signed)
 Visit Information  Thank you for taking time to visit with me today. Please don't hesitate to contact me if I can be of assistance to you before our next scheduled telephone appointment.  Our next appointment is referral to longitudinal RN CCM.  Following is a copy of your care plan:   Goals Addressed             This Visit's Progress    COMPLETED: V[facility] Transitions of Care (TOC) Care Plan       Problems:  Recent Hospitalization for treatment of CHF Knowledge Deficit Related to HF and regimen for follow up 10/29/24 Ongoing constipation, trying some natural remedies 11/04/24 Continue home remedy 11/11/24 Monitoring weight gain and bowel regimen, new medications reviewed  Goal:  Over the next 30 days, the patient will not experience hospital readmission  Interventions: (Reviewed with patient 11/19/24)  10/21/24 Reviewed AVS with patient: (HEART FAILURE PATIENTS) Call MD: Anytime you have any of the following symptoms: 1) 3 pound weight gain in 24 hours or 5 pounds in 1 week 2) shortness of breath, with or without a dry hacking cough 3) swelling in the hands, feet or stomach 4) if you have to sleep on extra pillows at night in order to breathe. Activity as tolerated - No restrictions Call MD for: difficulty breathing, headache or visual disturbances Call MD for: hives Call MD for: persistant dizziness or light-headedness Call MD for: persistant nausea and vomiting Call MD for: redness, tenderness, or signs of infection (pain, swelling, redness, odor or green/yellow discharge around incision site)  Call MD for: severe uncontrolled pain.  Patient verbalized understanding. 11/19/24 Patient states she has some pain relieved with Lasix  and with resting. Attempts to get a closer cardiology follow up encouraged and attempted to check scheduled today however office is currently closed.  Patient reminded to call 911 or proceed to the nearest ED.  Heart Failure Interventions: Basic overview and  discussion of pathophysiology of Heart Failure reviewed Provided education on low sodium diet Reviewed Heart Failure Action Plan in depth and provided written copy Assessed need for readable accurate scales in home Provided education about placing scale on hard, flat surface Advised patient to weigh each morning after emptying bladder Discussed importance of daily weight and advised patient to weigh and record daily Reviewed role of diuretics in prevention of fluid overload and management of heart failure; Discussed the importance of keeping all appointments with provider Provided patient with education about the role of exercise in the management of heart failure Screening for signs and symptoms of depression related to chronic disease state  Assessed social determinant of health barriers   Patient Self Care Activities:  Attend all scheduled provider appointments Call pharmacy for medication refills 3-7 days in advance of running out of medications Call provider office for new concerns or questions  Notify RN Care Manager of TOC call rescheduling needs Participate in Transition of Care Program/Attend TOC scheduled calls Take medications as prescribed    Plan:  The care management team will reach out to the patient again over the next 5 - 10 business days. The patient has been provided with contact information for the care management team and has been advised to call with any health related questions or concerns.  The patient will call [facility] for Cardiac Rehab as advised to call today as on page 3 of AVS. Patient verbalized understanding. Discussed and offered 30 day TOC program.  Patient  accepted.  The patient has  been provided with contact information for the care management team and has been advised to call with any health -related questions or concerns.  The patient verbalized understanding with current plan of care.  The patient is directed to their  insurance card regarding availability of benefits coverage.  Encouraged to check for blood pressure monitor. 10/29/24 Patient encouraged to continue to monitor weight and BP 11/11/24 Ongoing monitoring contact providers for unrelieved symptoms. Follow up with GI as needed with new regimen, 11/19/24 Patient agrees to longitudinal RN follow up for complex disease management, will refer.         Patient verbalizes understanding of instructions and care plan provided today and agrees to view in MyChart. Active MyChart status and patient understanding of how to access instructions and care plan via MyChart confirmed with patient.    Patient states her daughter [name] reviews for her.  The patient has been provided with contact information for the care management team and has been advised to call with any health related questions or concerns.  The patient will call to call PCP or cardiology* as advised to chest pain not relieved or call 911 or proceed to the nearest emergency room, patient states relief with Lasix  but verbalized understanding, encouraged follow up and get a closer cardiology appointment.  Follow up with provider re: medication refills and concerns with needs for ongoing GI issues as well. Contact GI provider with instructions for follow up reviewed  Please call the care guide team at [phone] if you need to cancel or reschedule your appointment.   Please call the [place]  National Suicide Prevention Lifeline: (629)366-5612 or TTY: 845-025-2400 TTY 856 470 6170) to talk to a trained counselor call 911 if you are experiencing a Mental Health or Behavioral Health Crisis or need someone to talk to.  Richerd Fish, RN, BSN, CCM [facility], [facility] Management Coordinator Direct Dial: [phone]

## 2024-11-29 NOTE — Progress Notes (Unsigned)
 Cardiology Office Note:    Date:  11/29/2024   ID:  Kelsey Nelson, DOB 1934/01/09, MRN [id number]  PCP:  [name], MD   [facility] Providers Cardiologist:  None { Click to update primary MD,subspecialty MD or APP then REFRESH:1}    Referring MD: [name], MD   No chief complaint on file. ***  History of Present Illness:    Kelsey Nelson is a 89 y.o. female with a hx of hypertension, hyperlipidemia, aortic atherosclerosis on CT scan, GERD, and history of diverticulosis.  She was seen by Dr. Jeffrie in 2019 for evaluation of chest pain.  Nuclear stress test in 2019 was low risk with no ischemia or infarction, EF 78%.  She was lost to follow-up.  She was admitted to the hospital October 2025 with dyspnea on exertion, elevated BNP.  Echocardiogram 10/19/2024 with an LVEF 35 to 60%, no RWMA, small pericardial effusion, trivial MR, moderate AI.  She was diuresed with IV Lasix  for acute diastolic heart failure.  GDMT was titrated to include Jardiance  and 20 mg Lasix  daily.  Dry weight felt to be 119-121 pounds.  She request to establish with Dr. Francyne.  She returns for scheduled follow-up.      Chronic diastolic heart failure GDMT: Jardiance  10 mg daily, Lasix  20 mg daily   Hypertension - BP well-controlled on ramipril    Moderate aortic insufficiency - Will follow clinically, consider repeat echocardiogram in 1 to 2 years    Past Medical History:  Diagnosis Date   Acute cystitis with hematuria 10/26/2013   Allergic rhinitis 11/08/2017   Blindness of right eye    BMI between 19-24,adult 2010 145 LBS   Diverticula of small intestine Dec 2010   Diverticulosis    [place]   DIVERTICULOSIS, SIGMOID COLON 03/31/2008   Qualifier: Diagnosis of  By: Tita Lipps  Noted on abdominal and pelvic scan in 04/2011.  Extensive colonic diverticulosis , and possible muscular hypertrophy at rectosigmoid junction    Family hx of colon cancer 11/09/2013    Functional constipation 2010   GERD (gastroesophageal reflux disease)    HTN (hypertension)    Hyperlipemia    Pancreatic cyst 02/05/2013   Renal cyst 05/27/2011   Right eye trauma 1949   Loss of vision resulted at age 59   Schatzki's ring Dec 2010   Skin lesions, generalized 06/19/2015   Tubular adenoma 11/09/2013    Past Surgical History:  Procedure Laterality Date   ABDOMINAL HYSTERECTOMY     BREAST REDUCTION SURGERY  1992 BIL   COLONOSCOPY  DEC 2010   SIMPLE ADENOMAS (6-8), [place] TICS, SML IH   COLONOSCOPY N/A 12/08/2013   Procedure: COLONOSCOPY;  Surgeon: Claudis RAYMOND Rivet, MD;  [facility];  Service: Endoscopy;  Laterality: N/A;  225   COLONOSCOPY N/A 08/13/2024   Procedure: COLONOSCOPY;  Surgeon: Eartha Angelia Sieving, MD;  [facility];  Service: Gastroenterology;  Laterality: N/A;  1:15 pm, asa 3   COLONOSCOPY WITH PROPOFOL  N/A 07/08/2023   Procedure: COLONOSCOPY WITH PROPOFOL ;  Surgeon: Eartha Angelia Sieving, MD;  [facility];  Service: Gastroenterology;  Laterality: N/A;  9:15am; asa 3   ESOPHAGOGASTRODUODENOSCOPY N/A 08/26/2024   Procedure: EGD (ESOPHAGOGASTRODUODENOSCOPY);  Surgeon: Eartha Angelia, Sieving, MD;  [facility];  Service: Gastroenterology;  Laterality: N/A;  10:30 AM, ASA 3   ESOPHAGOGASTRODUODENOSCOPY (EGD) WITH PROPOFOL  N/A 01/13/2024   Procedure: ESOPHAGOGASTRODUODENOSCOPY (EGD) WITH PROPOFOL ;  Surgeon: Eartha Angelia Sieving, MD;  [facility];  Service: Gastroenterology;  Laterality: N/A;  2:30PM;ASA 3   EVISCERATION Right 05/08/2015   Procedure: EVISCERATION WITH IMPLANT RIGHT EYE;  Surgeon: Dow JULIANNA Burke, MD;  Location: [facility];  Service: Ophthalmology;  Laterality: Right;   EYE SURGERY  RIGHT 2005   EYE SURGERY  right 2016   removed and false eye placed in 08/2015   HEMOSTASIS CLIP PLACEMENT  07/08/2023   Procedure: HEMOSTASIS CLIP PLACEMENT;  Surgeon: Eartha Angelia Sieving, MD;   [facility];  Service: Gastroenterology;;   POLYPECTOMY  07/08/2023   Procedure: POLYPECTOMY;  Surgeon: Eartha Angelia Sieving, MD;  [facility];  Service: Gastroenterology;;   SUBMUCOSAL LIFTING INJECTION  07/08/2023   Procedure: SUBMUCOSAL LIFTING INJECTION;  Surgeon: Eartha Angelia Sieving, MD;  [facility];  Service: Gastroenterology;;   SUBMUCOSAL TATTOO INJECTION  07/08/2023   Procedure: SUBMUCOSAL TATTOO INJECTION;  Surgeon: Eartha Angelia, Sieving, MD;  [facility];  Service: Gastroenterology;;   TOTAL ABDOMINAL HYSTERECTOMY W/ BILATERAL SALPINGOOPHORECTOMY  1985 FIBROIDS   UPPER GASTROINTESTINAL ENDOSCOPY  DEC 2010   Bx-MILD GASTRITIS/DUODENITIS, DUODENAL DIVERTICULA, SCHATZKI'S RING    Current Medications: No outpatient medications have been marked as taking for the 12/01/24 encounter (Appointment) with Madie Jon Garre, PA.   Current Facility-Administered Medications for the 12/01/24 encounter (Appointment) with Madie Jon Garre, PA  Medication   omalizumab  (XOLAIR ) prefilled syringe 300 mg     Allergies:   Codeine, Crestor  [rosuvastatin ], and Fosamax [alendronate sodium]   Social History   Socioeconomic History   Marital status: Married    Spouse name: [name]    Number of children: 3   Years of education: 12   Highest education level: 12th grade  Occupational History   Occupation: retired   Tobacco Use   Smoking status: Never    Passive exposure: Past   Smokeless tobacco: Never  Vaping Use   Vaping status: Never Used  Substance and Sexual Activity   Alcohol use: No    Alcohol/week: 0.0 standard drinks of alcohol   Drug use: No   Sexual activity: Not Currently    Birth control/protection: Post-menopausal, Surgical    Comment: hysterectomy  Other Topics Concern   Not on file  Social History Narrative   MARRIED TO DAVID Blust. 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/   Social Drivers of Research Scientist (physical Sciences) Strain: Low Risk  (12/09/2023)   Overall Financial Resource Strain (CARDIA)    Difficulty of Paying Living Expenses: Not hard at all  Food Insecurity: No Food Insecurity (10/21/2024)   Hunger Vital Sign    Worried About Running Out of Food in the Last Year: Never true    Ran Out of Food in the Last Year: Never true  Transportation Needs: No Transportation Needs (10/21/2024)   PRAPARE - Administrator, Civil Service (Medical): No    Lack of Transportation (Non-Medical): No  Physical Activity: Sufficiently Active (12/09/2023)   Exercise Vital Sign    Days of Exercise per Week: 5 days    Minutes of Exercise per Session: 60 min  Stress: No Stress Concern Present (12/09/2023)   [employer] of Occupational Health - Occupational Stress Questionnaire    Feeling of Stress : Not at all  Social Connections: Socially Integrated (10/18/2024)   Social Connection and Isolation Panel    Frequency of Communication with Friends and Family: More than three times a week    Frequency of Social Gatherings with Friends and  Family: More than three times a week    Attends Religious Services: More than 4 times per year    Active Member of Clubs or Organizations: Yes    Attends Engineer, Structural: More than 4 times per year    Marital Status: Married     Family History: The patient's ***family history includes Colon cancer in her paternal aunt; Pancreatic cancer in her sister. There is no history of Colon polyps.  ROS:   Please see the history of present illness.    *** All other systems reviewed and are negative.  EKGs/Labs/Other Studies Reviewed:    The following studies were reviewed today: ***      Recent Labs: 10/18/2024: ALT 28; Pro Brain Natriuretic Peptide 1,986.0 10/19/2024: Hemoglobin 12.0; Platelets 289; TSH 2.460 10/20/2024: Magnesium  2.3 11/05/2024: BUN 20; Creatinine, Ser 0.95; Potassium 4.5; Sodium 138  Recent Lipid Panel    Component Value  Date/Time   CHOL 303 (H) 12/30/2023 0801   TRIG 103 12/30/2023 0801   HDL 112 12/30/2023 0801   CHOLHDL 2.7 12/30/2023 0801   CHOLHDL 2.5 10/01/2019 0726   VLDL 26 06/26/2017 0732   LDLCALC 174 (H) 12/30/2023 0801   LDLCALC 122 (H) 10/01/2019 0726     Risk Assessment/Calculations:   {Does this patient have ATRIAL FIBRILLATION?:[phone]}  No BP recorded.  {Refresh Note OR Click here to enter BP  :1}***         Physical Exam:    VS:  There were no vitals taken for this visit.    Wt Readings from Last 3 Encounters:  11/19/24 119 lb 3.2 oz (54.1 kg)  11/11/24 119 lb 6.4 oz (54.2 kg)  11/04/24 122 lb (55.3 kg)     GEN: *** Well nourished, well developed in no acute distress HEENT: Normal NECK: No JVD; No carotid bruits LYMPHATICS: No lymphadenopathy CARDIAC: ***RRR, no murmurs, rubs, gallops RESPIRATORY:  Clear to auscultation without rales, wheezing or rhonchi  ABDOMEN: Soft, non-tender, non-distended MUSCULOSKELETAL:  No edema; No deformity  SKIN: Warm and dry NEUROLOGIC:  Alert and oriented x 3 PSYCHIATRIC:  Normal affect   ASSESSMENT:    No diagnosis found. PLAN:    In order of problems listed above:  ***      {Are you ordering a CV Procedure (e.g. stress test, cath, DCCV, TEE, etc)?   Press F2        :[id number]}    Medication Adjustments/Labs and Tests Ordered: Current medicines are reviewed at length with the patient today.  Concerns regarding medicines are outlined above.  No orders of the defined types were placed in this encounter.  No orders of the defined types were placed in this encounter.   There are no Patient Instructions on file for this visit.   Signed, Jon Nat Hails, [place]  11/29/2024 7:46 PM    [facility]

## 2024-12-01 ENCOUNTER — Ambulatory Visit: Attending: Physician Assistant | Admitting: Physician Assistant

## 2024-12-01 ENCOUNTER — Encounter: Payer: Self-pay | Admitting: *Deleted

## 2024-12-01 ENCOUNTER — Encounter: Payer: Self-pay | Admitting: Physician Assistant

## 2024-12-01 ENCOUNTER — Telehealth: Payer: Self-pay | Admitting: *Deleted

## 2024-12-01 VITALS — BP 126/62 | HR 63 | Ht 65.5 in | Wt 121.8 lb

## 2024-12-01 DIAGNOSIS — G4700 Insomnia, unspecified: Secondary | ICD-10-CM | POA: Diagnosis not present

## 2024-12-01 DIAGNOSIS — I5032 Chronic diastolic (congestive) heart failure: Secondary | ICD-10-CM | POA: Diagnosis not present

## 2024-12-01 DIAGNOSIS — I10 Essential (primary) hypertension: Secondary | ICD-10-CM | POA: Diagnosis not present

## 2024-12-01 DIAGNOSIS — N951 Menopausal and female climacteric states: Secondary | ICD-10-CM | POA: Diagnosis not present

## 2024-12-01 DIAGNOSIS — K219 Gastro-esophageal reflux disease without esophagitis: Secondary | ICD-10-CM | POA: Diagnosis not present

## 2024-12-01 DIAGNOSIS — I351 Nonrheumatic aortic (valve) insufficiency: Secondary | ICD-10-CM | POA: Diagnosis not present

## 2024-12-01 DIAGNOSIS — N1831 Chronic kidney disease, stage 3a: Secondary | ICD-10-CM | POA: Diagnosis not present

## 2024-12-01 NOTE — Patient Instructions (Addendum)
 Medication Instructions:  CHANGE the way you take Lasix  to as needed if weight gain of three (3) pounds overnight or five (5) pounds in a week.  *If you need a refill on your cardiac medications before your next appointment, please call your pharmacy*  Lab Work: None ordered If you have labs (blood work) drawn today and your tests are completely normal, you will receive your results only by: MyChart Message (if you have MyChart) OR A paper copy in the mail If you have any lab test that is abnormal or we need to change your treatment, we will call you to review the results.  Testing/Procedures: None ordered  Follow-Up: At [facility], you and your health needs are our priority.  As part of our continuing mission to provide you with exceptional heart care, our providers are all part of one team.  This team includes your primary Cardiologist (physician) and Advanced Practice Providers or APPs (Physician Assistants and Nurse Practitioners) who all work together to provide you with the care you need, when you need it.  Your next appointment:   2 month(s)  Provider:   Jerel Balding, MD    We recommend signing up for the patient portal called MyChart.  Sign up information is provided on this After Visit Summary.  MyChart is used to connect with patients for Virtual Visits (Telemedicine).  Patients are able to view lab/test results, encounter notes, upcoming appointments, etc.  Non-urgent messages can be sent to your provider as well.   To learn more about what you can do with MyChart, go to [email or web address].   Other Instructions We are Referring you to [facility] w Dr. Albertus  We are referring you [facility].

## 2024-12-01 NOTE — Patient Instructions (Signed)
 Kelsey Nelson - I am sorry I was unable to reach you today for our scheduled appointment. I work with [name], MD and am calling to support your healthcare needs. Please contact me at [phone] at your earliest convenience. I look forward to speaking with you soon.   Thank you,  Rosina Forte, BSN RN [facility], [facility] RN Care Manager Direct Dial: [phone]  Fax: [phone]

## 2024-12-07 ENCOUNTER — Encounter: Payer: Self-pay | Admitting: Internal Medicine

## 2024-12-07 ENCOUNTER — Ambulatory Visit: Admitting: Internal Medicine

## 2024-12-07 ENCOUNTER — Ambulatory Visit: Admitting: Physician Assistant

## 2024-12-07 VITALS — BP 138/72 | HR 80 | Ht 65.5 in | Wt 123.4 lb

## 2024-12-07 DIAGNOSIS — F5104 Psychophysiologic insomnia: Secondary | ICD-10-CM

## 2024-12-07 MED ORDER — HYDROXYZINE PAMOATE 25 MG PO CAPS: 25.0000 mg | ORAL_CAPSULE | Freq: Every evening | ORAL | 2 refills | Status: DC | PRN

## 2024-12-07 NOTE — Progress Notes (Signed)
 Acute Office Visit  Subjective:    Patient ID: Kelsey Nelson, female    DOB: Mar 05, 1934, 89 y.o.   MRN: [id number]  Chief Complaint  Patient presents with   Insomnia    Ongoing for years, last night was not a good night    HPI Patient is in today for complaint of longstanding insomnia.  She has noticed worsening of insomnia recently, was not able to sleep last night.  She has tried melatonin without much relief.  She takes Benadryl  usually, which helps her sleep for about 2 hours.  She denies any recent worsening of anxiety.  She has tried trazodone , Remeron , temazepam , doxylamine, clomipramine and Belsomra without much relief.  Past Medical History:  Diagnosis Date   Acute cystitis with hematuria 10/26/2013   Allergic rhinitis 11/08/2017   Blindness of right eye    BMI between 19-24,adult 2010 145 LBS   Diverticula of small intestine Dec 2010   Diverticulosis    [place]   DIVERTICULOSIS, SIGMOID COLON 03/31/2008   Qualifier: Diagnosis of  By: Tita Lipps  Noted on abdominal and pelvic scan in 04/2011.  Extensive colonic diverticulosis , and possible muscular hypertrophy at rectosigmoid junction    Family hx of colon cancer 11/09/2013   Functional constipation 2010   GERD (gastroesophageal reflux disease)    HTN (hypertension)    Hyperlipemia    Pancreatic cyst 02/05/2013   Renal cyst 05/27/2011   Right eye trauma 1949   Loss of vision resulted at age 57   Schatzki's ring Dec 2010   Skin lesions, generalized 06/19/2015   Tubular adenoma 11/09/2013    Past Surgical History:  Procedure Laterality Date   ABDOMINAL HYSTERECTOMY     BREAST REDUCTION SURGERY  1992 BIL   COLONOSCOPY  DEC 2010   SIMPLE ADENOMAS (6-8), [place] TICS, SML IH   COLONOSCOPY N/A 12/08/2013   Procedure: COLONOSCOPY;  Surgeon: Claudis RAYMOND Rivet, MD;  [facility];  Service: Endoscopy;  Laterality: N/A;  225   COLONOSCOPY N/A 08/13/2024   Procedure: COLONOSCOPY;  Surgeon: Eartha Angelia Sieving, MD;   [facility];  Service: Gastroenterology;  Laterality: N/A;  1:15 pm, asa 3   COLONOSCOPY WITH PROPOFOL  N/A 07/08/2023   Procedure: COLONOSCOPY WITH PROPOFOL ;  Surgeon: Eartha Angelia Sieving, MD;  [facility];  Service: Gastroenterology;  Laterality: N/A;  9:15am; asa 3   ESOPHAGOGASTRODUODENOSCOPY N/A 08/26/2024   Procedure: EGD (ESOPHAGOGASTRODUODENOSCOPY);  Surgeon: Eartha Angelia, Sieving, MD;  [facility];  Service: Gastroenterology;  Laterality: N/A;  10:30 AM, ASA 3   ESOPHAGOGASTRODUODENOSCOPY (EGD) WITH PROPOFOL  N/A 01/13/2024   Procedure: ESOPHAGOGASTRODUODENOSCOPY (EGD) WITH PROPOFOL ;  Surgeon: Eartha Angelia Sieving, MD;  [facility];  Service: Gastroenterology;  Laterality: N/A;  2:30PM;ASA 3   EVISCERATION Right 05/08/2015   Procedure: EVISCERATION WITH IMPLANT RIGHT EYE;  Surgeon: Dow JULIANNA Burke, MD;  Location: [facility];  Service: Ophthalmology;  Laterality: Right;   EYE SURGERY  RIGHT 2005   EYE SURGERY  right 2016   removed and false eye placed in 08/2015   HEMOSTASIS CLIP PLACEMENT  07/08/2023   Procedure: HEMOSTASIS CLIP PLACEMENT;  Surgeon: Eartha Angelia Sieving, MD;  [facility];  Service: Gastroenterology;;   POLYPECTOMY  07/08/2023   Procedure: POLYPECTOMY;  Surgeon: Eartha Angelia Sieving, MD;  [facility];  Service: Gastroenterology;;   SUBMUCOSAL LIFTING INJECTION  07/08/2023   Procedure: SUBMUCOSAL LIFTING INJECTION;  Surgeon: Eartha Angelia Sieving, MD;  [facility];  Service: Gastroenterology;;   SUBMUCOSAL TATTOO INJECTION  07/08/2023   Procedure: SUBMUCOSAL TATTOO INJECTION;  Surgeon: Eartha Flavors, Toribio, MD;  [facility];  Service: Gastroenterology;;   TOTAL ABDOMINAL HYSTERECTOMY W/ BILATERAL SALPINGOOPHORECTOMY  1985 FIBROIDS   UPPER GASTROINTESTINAL ENDOSCOPY  DEC 2010   Bx-MILD GASTRITIS/DUODENITIS, DUODENAL DIVERTICULA, SCHATZKI'S RING    Family  History  Problem Relation Age of Onset   Pancreatic cancer Sister    Colon cancer Paternal Aunt    Colon polyps Neg Hx     Social History   Socioeconomic History   Marital status: Married    Spouse name: [name]    Number of children: 3   Years of education: 12   Highest education level: 12th grade  Occupational History   Occupation: retired   Tobacco Use   Smoking status: Never    Passive exposure: Past   Smokeless tobacco: Never  Vaping Use   Vaping status: Never Used  Substance and Sexual Activity   Alcohol use: No    Alcohol/week: 0.0 standard drinks of alcohol   Drug use: No   Sexual activity: Not Currently    Birth control/protection: Post-menopausal, Surgical    Comment: hysterectomy  Other Topics Concern   Not on file  Social History Narrative   MARRIED TO DAVID Wellbrock. 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/   Social Drivers of Health   Tobacco Use: Low Risk (12/07/2024)   Patient History    Smoking Tobacco Use: Never    Smokeless Tobacco Use: Never    Passive Exposure: Past  Financial Resource Strain: Low Risk (12/09/2023)   Overall Financial Resource Strain (CARDIA)    Difficulty of Paying Living Expenses: Not hard at all  Food Insecurity: No Food Insecurity (10/21/2024)   Epic    Worried About Programme Researcher, Broadcasting/film/video in the Last Year: Never true    Ran Out of Food in the Last Year: Never true  Transportation Needs: No Transportation Needs (10/21/2024)   Epic    Lack of Transportation (Medical): No    Lack of Transportation (Non-Medical): No  Physical Activity: Sufficiently Active (12/09/2023)   Exercise Vital Sign    Days of Exercise per Week: 5 days    Minutes of Exercise per Session: 60 min  Stress: No Stress Concern Present (12/09/2023)   [employer] of Occupational Health - Occupational Stress Questionnaire    Feeling of Stress : Not at all  Social Connections: Socially Integrated (10/18/2024)   Social Connection and Isolation Panel    Frequency of  Communication with Friends and Family: More than three times a week    Frequency of Social Gatherings with Friends and Family: More than three times a week    Attends Religious Services: More than 4 times per year    Active Member of Clubs or Organizations: Yes    Attends Banker Meetings: More than 4 times per year    Marital Status: Married  Catering Manager Violence: Not At Risk (10/21/2024)   Epic    Fear of Current or Ex-Partner: No    Emotionally Abused: No    Physically Abused: No    Sexually Abused: No  Depression (PHQ2-9): Low Risk (12/07/2024)   Depression (PHQ2-9)    PHQ-2 Score: 0  Alcohol Screen: Low Risk (12/09/2023)   Alcohol Screen    Last Alcohol Screening Score (AUDIT): 0  Housing: Low Risk (10/21/2024)   Epic    Unable to Pay for Housing in the Last  Year: No    Number of Times Moved in the Last Year: 0    Homeless in the Last Year: No  Utilities: Not At Risk (10/21/2024)   Epic    Threatened with loss of utilities: No  Health Literacy: Not on file    Outpatient Medications Prior to Visit  Medication Sig Dispense Refill   Cholecalciferol (VITAMIN D3 PO) Take 25 mcg by mouth daily.     diphenhydrAMINE  (BENADRYL  ALLERGY) 25 mg capsule Takes one at bedtime then 3 to 4 hours later takes a 2nd one     donepezil  (ARICEPT ) 10 MG tablet Take 1 tablet (10 mg total) by mouth at bedtime. 90 tablet 3   empagliflozin  (JARDIANCE ) 10 MG TABS tablet Take 1 tablet (10 mg total) by mouth daily. 30 tablet 2   erythromycin  ophthalmic ointment Apply 0.5 inches to eye 4 (four) times daily.     estradiol  (ESTRACE ) 0.1 MG/GM vaginal cream Discard plastic applicator. Insert a blueberry size amount (approximately 1 gram) of cream on fingertip inside vagina at bedtime every night for 1 week then every other night. For long term use. 30 g 3   fexofenadine  (ALLEGRA  ALLERGY) 180 MG tablet Take 1 tablet (180 mg total) by mouth every morning. 30 tablet 5   fluticasone  (FLONASE )  50 MCG/ACT nasal spray Place 1 spray into both nostrils daily. 16 g 5   furosemide  (LASIX ) 20 MG tablet Take 1 tablet (20 mg total) by mouth every other day. (Patient taking differently: Take 20 mg by mouth as needed. Take one (1) tablet by mouth for weight gain of three (3) pounds over night or five (5) pounds in a week.) 45 tablet 3   linaclotide  (LINZESS ) 72 MCG capsule Take 72 mcg by mouth every other day.     MAGNESIUM  PO Take by mouth.     Multiple Vitamin (MULTIVITAMIN PO) Take by mouth.     Omega-3 Fatty Acids (OMEGA 3 FISH OIL PO) Take 1 capsule by mouth daily.     omeprazole  (PRILOSEC) 40 MG capsule Take 40 mg by mouth daily.     OVER THE COUNTER MEDICATION Multi Collagen Protein - Patient states that she takes 1 scoop a day.     OVER THE COUNTER MEDICATION One daily hylands leg cramp relief     OVER THE COUNTER MEDICATION Omega xl joint and muscle support daily     polyethylene glycol (MIRALAX ) 17 g packet Take 17 g by mouth daily. 30 each 0   prednisoLONE  acetate (PRED FORTE ) 1 % ophthalmic suspension SMARTSIG:In Eye(s)     ramipril  (ALTACE ) 10 MG capsule Take one capsule by mouth once daily for blood pressure 90 capsule 3   senna-docusate (SENOKOT-S) 8.6-50 MG tablet Take 2 tablets by mouth at bedtime as needed for mild constipation. 30 tablet 0   Facility-Administered Medications Prior to Visit  Medication Dose Route Frequency Provider Last Rate Last Admin   omalizumab  (XOLAIR ) prefilled syringe 300 mg  300 mg Subcutaneous Q28 days Iva Marty Saltness, MD   300 mg at 09/15/23 9176    Allergies[1]  Review of Systems  Constitutional:  Negative for chills and fever.  HENT:  Negative for congestion, sinus pressure, sinus pain and sore throat.   Eyes:  Negative for pain and discharge.  Respiratory:  Negative for cough and shortness of breath.   Cardiovascular:  Negative for chest pain and palpitations.  Gastrointestinal:  Negative for abdominal pain, diarrhea, nausea and  vomiting.  Endocrine: Negative for polydipsia  and polyuria.  Genitourinary:  Negative for dysuria and hematuria.  Musculoskeletal:  Negative for neck pain and neck stiffness.  Skin:  Negative for rash.  Neurological:  Negative for dizziness and weakness.  Psychiatric/Behavioral:  Positive for sleep disturbance. Negative for agitation and behavioral problems.        Objective:    Physical Exam Vitals reviewed.  Constitutional:      General: She is not in acute distress.    Appearance: She is not diaphoretic.  HENT:     Head: Normocephalic and atraumatic.     Nose: Nose normal.     Mouth/Throat:     Mouth: Mucous membranes are moist.  Eyes:     General: No scleral icterus.    Extraocular Movements: Extraocular movements intact.  Cardiovascular:     Rate and Rhythm: Normal rate and regular rhythm.     Heart sounds: Normal heart sounds. No murmur heard. Pulmonary:     Breath sounds: Normal breath sounds. No wheezing or rales.  Musculoskeletal:     Cervical back: Neck supple. No tenderness.     Right lower leg: No edema.     Left lower leg: No edema.  Skin:    General: Skin is warm.     Findings: No rash.  Neurological:     General: No focal deficit present.     Mental Status: She is alert and oriented to person, place, and time.  Psychiatric:        Mood and Affect: Mood normal.        Behavior: Behavior normal.     BP 138/72 (BP Location: Left Arm)   Pulse 80   Ht 5' 5.5 (1.664 m)   Wt 123 lb 6.4 oz (56 kg)   SpO2 97%   BMI 20.22 kg/m  Wt Readings from Last 3 Encounters:  12/07/24 123 lb 6.4 oz (56 kg)  12/01/24 121 lb 12.8 oz (55.2 kg)  11/19/24 119 lb 3.2 oz (54.1 kg)        Assessment & Plan:   Problem List Items Addressed This Visit       Other   Insomnia - Primary   History of resistant insomnia Has tried trazodone , mirtazapine , temazepam , doxylamine, imipramine , and Belsomra She has noticed mild improvement with Benadryl  Prescribed  hydroxyzine  25 mg nightly as needed for insomnia Sleep hygiene material provided       Relevant Medications   hydrOXYzine  (VISTARIL ) 25 MG capsule     Meds ordered this encounter  Medications   hydrOXYzine  (VISTARIL ) 25 MG capsule    Sig: Take 1 capsule (25 mg total) by mouth at bedtime as needed for anxiety (Insomnia).    Dispense:  30 capsule    Refill:  2     Korie Streat MARLA Blanch, MD     [1]  Allergies Allergen Reactions   Codeine    Crestor  [Rosuvastatin ] Other (See Comments)    Can't recall   Fosamax [Alendronate Sodium]     heartburn

## 2024-12-07 NOTE — Assessment & Plan Note (Addendum)
 History of resistant insomnia Has tried trazodone , mirtazapine , temazepam , doxylamine, imipramine , and Belsomra She has noticed mild improvement with Benadryl  Prescribed hydroxyzine  25 mg nightly as needed for insomnia Sleep hygiene material provided

## 2024-12-07 NOTE — Patient Instructions (Signed)
 Please start taking Hydroxyzine  as prescribed. Please do not take Benadryl  with it.  Okay to continue Melatonin 5 mg at bedtime.  Please maintain simple sleep hygiene. - Maintain dark and non-noisy environment in the bedroom. - Please use the bedroom for sleeping only. - Do not use electronic devices in the bedroom. - Please take dinner at least 2 hours before bedtime. - Please avoid caffeinated products in the evening, including coffee, soft drinks. - Please try to maintain the regular sleep-wake cycle - Go to bed and wake up at the same time.

## 2024-12-13 ENCOUNTER — Telehealth: Payer: Self-pay

## 2024-12-13 ENCOUNTER — Ambulatory Visit: Admitting: Nurse Practitioner

## 2024-12-13 NOTE — Telephone Encounter (Signed)
 Copied from CRM [id number]. Topic: Appointments - Scheduling Inquiry for Clinic >> Dec 13, 2024  2:46 PM Rosaria E wrote: Reason for CRM: Pt's daughter called reporting that the patient is not sleeping. She is exhausted. Please advise   Best contact: [phone] Jesusa)

## 2024-12-14 ENCOUNTER — Other Ambulatory Visit: Payer: Self-pay | Admitting: Internal Medicine

## 2024-12-14 DIAGNOSIS — F5104 Psychophysiologic insomnia: Secondary | ICD-10-CM

## 2024-12-14 MED ORDER — ZOLPIDEM TARTRATE 5 MG PO TABS: 5.0000 mg | ORAL_TABLET | Freq: Every evening | ORAL | 0 refills | Status: DC | PRN

## 2024-12-14 NOTE — Telephone Encounter (Signed)
 Lvm to cb

## 2024-12-14 NOTE — Telephone Encounter (Signed)
 Pr daughter informed

## 2024-12-20 ENCOUNTER — Telehealth: Payer: Self-pay

## 2024-12-20 NOTE — Progress Notes (Unsigned)
 Complex Care Management Care Guide Note  12/20/2024 Name: Kelsey Nelson MRN: [id number] DOB: 1934-10-13  Kelsey Nelson is a 89 y.o. year old female who is a primary care patient of [name], MD and is actively engaged with the care management team. I reached out to [name] by phone today to assist with re-scheduling  with the RN Case Manager.  Follow up plan: Unsuccessful telephone outreach attempt made. A HIPAA compliant phone message was left for the patient providing contact information and requesting a return call.  Jeoffrey Buffalo , RMA     [facility]  [facility] Guide  Direct Dial: [phone]  Website: [email or web address]

## 2024-12-21 ENCOUNTER — Ambulatory Visit: Payer: Self-pay

## 2024-12-21 ENCOUNTER — Telehealth: Payer: Self-pay | Admitting: Internal Medicine

## 2024-12-21 NOTE — Telephone Encounter (Signed)
"  scheduled  "

## 2024-12-21 NOTE — Telephone Encounter (Signed)
 Would you like to work in sooner? Or should pt keep appt on 01/28 with Ronal Quant?

## 2024-12-21 NOTE — Telephone Encounter (Signed)
 Scheduled for soonest OV. PCP is unavailable until March.   FYI Only or Action Required?: FYI only for provider: appointment scheduled on 12/24/24.  Patient was last seen in primary care on 12/07/2024 by Tobie Suzzane POUR, MD.  Called Nurse Triage reporting Insomnia.  Symptoms began several years ago.  Interventions attempted: Prescription medications: ambien .  Symptoms are: gradually worsening.  Triage Disposition: See PCP Within 2 Weeks  Patient/caregiver understands and will follow disposition?: Yes Reason for Disposition  [1] Insomnia lasts > 2 weeks AND [2] no improvement after using Care Advice  Answer Assessment - Initial Assessment Questions Patient began ambien  on 12/05/24. Has taken nightly and still cannot sleep. Advised patients daughter to call or have pt seen sooner at [facility] if other symptoms develop, including pain, complaints of urinary symptoms, confusion, agitation. She verbalized understanding.  1. DESCRIPTION: Tell me about your sleeping problem. (e.g., waking frequently during night, sleeping during day and awake at night, trouble falling asleep) How bad is it?      Trouble falling asleep 2. ONSET: How long have you been having trouble sleeping? (e.g., days, weeks, months; longstanding sleep problems)     Years, worse past month 3. DAYTIME SLEEP PATTERN: How much time do you spend sleeping or napping during the day?     Denies napping 4. STRESSORS: Is there anything that is making you feel stressed? Is there something that worries you?     Denies 5. PAIN: Do you have any pain that is keeping you awake? (e.g., back pain, joint pain) If Yes, ask How bad is the pain? (e.g., scale 0-10; mild, moderate, severe).     Denies  Protocols used: Insomnia-A-AH Message from [place] [facility] sent at 12/21/2024 11:45 AM EST  Summary: rx concern   Reason for Triage: The patient's daughter would like to speak with a member of staff about the patient's recent reaction to  zolpidem  (AMBIEN ) 5 MG tablet [[id number]] and their inability to sleep. The patient has been unable to sleep regularly since consuming the medication on 12/05/24 Please contact when possible

## 2024-12-21 NOTE — Telephone Encounter (Signed)
 Patient aware appt on 12/24/2024 with Oneil Severin at [facility] has been cancelled. Aware to follow up with PCP for insomnia that she has had for several years (not an acute issue). Patient says she wasnt even aware that her appt was scheduled at another facility and will call her PCP office to make an appt.

## 2024-12-21 NOTE — Telephone Encounter (Signed)
Noted patient scheduled

## 2024-12-21 NOTE — Telephone Encounter (Signed)
"  Patient scheduled 01/19/2025  "

## 2024-12-21 NOTE — Telephone Encounter (Signed)
 Spoke with pts daughter and she is aware of recommendations per Dr. Albertus. She knows to contact the most recent gi provider her mother saw for a referral to [facility] or [facility].

## 2024-12-21 NOTE — Telephone Encounter (Signed)
 Given the clinical nature of this response and request I am sending this to Pod A triage Patient requesting an appointment for third opinion from GI provider.  She has been seen by [facility] earlier this year and then by [facility] in November. I have reviewed the notes and it is unlikely that I would have additional recommendations unless there is information we do not have. Perhaps the best move would be at tertiary care center such as [facility] or [facility]?

## 2024-12-21 NOTE — Telephone Encounter (Signed)
 Good morning Dr. Albertus,   Patients daughter [name] called stating she wanted to schedule her mother for an appointment off of a referral that was sent over to you for GERD. After looking at patients chart, she is a current patient at [facility] with Dr. Eartha. She states she is requesting transfer of care because she feels as if her mother is not getting help there. Patient was last seen with him in September of this year from an EGD. I also looks like she seen [facility] in November of this year.  Patients records are in epic for you to review. Will you please review and advise on scheduling patient?   Thank you.

## 2024-12-24 ENCOUNTER — Ambulatory Visit: Admitting: Family Medicine

## 2024-12-28 ENCOUNTER — Telehealth: Payer: Self-pay | Admitting: *Deleted

## 2024-12-28 ENCOUNTER — Encounter: Payer: Self-pay | Admitting: *Deleted

## 2024-12-28 NOTE — Patient Instructions (Signed)
 [name] - At your request, I have rescheduled you to Friday at 9 am.  I work with [name], MD and am calling to support your healthcare needs. Please contact me at [phone] at your earliest convenience. I look forward to speaking with you soon.   Thank you,  Rosina Forte, BSN RN [facility], [facility] RN Care Manager Direct Dial: [phone]  Fax: [phone]

## 2024-12-30 ENCOUNTER — Encounter: Payer: Self-pay | Admitting: Family Medicine

## 2024-12-30 ENCOUNTER — Ambulatory Visit: Payer: Self-pay | Admitting: Family Medicine

## 2024-12-30 VITALS — BP 157/60 | HR 87 | Resp 16 | Ht 65.5 in | Wt 122.0 lb

## 2024-12-30 DIAGNOSIS — I10 Essential (primary) hypertension: Secondary | ICD-10-CM | POA: Diagnosis not present

## 2024-12-30 DIAGNOSIS — I351 Nonrheumatic aortic (valve) insufficiency: Secondary | ICD-10-CM | POA: Diagnosis not present

## 2024-12-30 DIAGNOSIS — F5104 Psychophysiologic insomnia: Secondary | ICD-10-CM | POA: Diagnosis not present

## 2024-12-30 DIAGNOSIS — G3184 Mild cognitive impairment, so stated: Secondary | ICD-10-CM

## 2024-12-30 DIAGNOSIS — R432 Parageusia: Secondary | ICD-10-CM | POA: Diagnosis not present

## 2024-12-30 DIAGNOSIS — F321 Major depressive disorder, single episode, moderate: Secondary | ICD-10-CM

## 2024-12-30 MED ORDER — EMPAGLIFLOZIN 10 MG PO TABS: 10.0000 mg | ORAL_TABLET | Freq: Every day | ORAL | 2 refills | Status: AC

## 2024-12-30 MED ORDER — PREDNISONE 5 MG PO TABS: 5.0000 mg | ORAL_TABLET | Freq: Two times a day (BID) | ORAL | 0 refills | Status: AC

## 2024-12-30 NOTE — Patient Instructions (Signed)
 F/U end March   INCREASE zolpidem  5 mg to TWO at bedtime ( dose of 10 mg daily total)Call and let me know in the next 7 to 10 days if this works  Prednisone  is prescribed for 5 days to help with appetite  I suggest you contact the prosthesis center for recommendation for irritation you are experiencing  Thanks for choosing [facility], we consider it a privelige to serve you.

## 2024-12-31 ENCOUNTER — Other Ambulatory Visit: Payer: Self-pay | Admitting: *Deleted

## 2024-12-31 NOTE — Patient Instructions (Signed)
 Visit Information  Thank you for taking time to visit with me today. Please don't hesitate to contact me if I can be of assistance to you before our next scheduled appointment.  Our next appointment is by telephone on 01-14-2025 at 9:30 am Please call the care guide team at [phone] if you need to cancel or reschedule your appointment.   Following is a copy of your care plan:   Goals Addressed             This Visit's Progress    VBCI RN Care Plan - HF       Problems:  Chronic Disease Management support and education needs related to CHF  Goal: Over the next 90 days the Patient will attend all scheduled medical appointments: with primary care provider and specialist as evidenced by keeping all scheduled appointments        continue to work with RN Care Manager and/or Social Worker to address care management and care coordination needs related to CHF as evidenced by adherence to care management team scheduled appointments     take all medications exactly as prescribed and will call provider for medication related questions as evidenced by compliance with all medications    verbalize basic understanding of CHF disease process and self health management plan as evidenced by verbal explanation, recognizing/monitoring symptoms, lifestyle modifications  Interventions:   Heart Failure Interventions: Basic overview and discussion of pathophysiology of Heart Failure reviewed Provided education on low sodium diet Reviewed Heart Failure Action Plan in depth and provided written copy Advised patient to weigh each morning after emptying bladder Discussed importance of daily weight and advised patient to weigh and record daily Reviewed role of diuretics in prevention of fluid overload and management of heart failure; Discussed the importance of keeping all appointments with provider Provided patient with education about the role of exercise in the management of heart failure Advised patient  to discuss acute changes with provider Screening for signs and symptoms of depression related to chronic disease state  Assessed social determinant of health barriers  Wt Readings from Last 3 Encounters:  12/31/24 117 lb 11.2 oz (53.4 kg)  12/30/24 122 lb (55.3 kg)  12/07/24 123 lb 6.4 oz (56 kg)     Patient Self-Care Activities:  Attend all scheduled provider appointments Call provider office for new concerns or questions  Take medications as prescribed   call office if I gain more than 2 pounds in one day or 5 pounds in one week use salt in moderation watch for swelling in feet, ankles and legs every day weigh myself daily follow rescue plan if symptoms flare-up  Plan:  Telephone follow up appointment with care management team member scheduled for:  01-14-2025 at 9:30 am          VBCI RN Care Plan - HTN       Problems:  Chronic Disease Management support and education needs related to HTN  Goal: Over the next 90 days the Patient will attend all scheduled medical appointments: with primary care provider and specialist as evidenced by keeping all scheduled appointments        continue to work with RN Care Manager and/or Social Worker to address care management and care coordination needs related to HTN as evidenced by adherence to care management team scheduled appointments     take all medications exactly as prescribed and will call provider for medication related questions as evidenced by compliance with all medications    verbalize basic  understanding of HTN disease process and self health management plan as evidenced by verbal explanation, recognizing/monitoring symptoms, lifestyle modifications  Interventions:   Hypertension Interventions: Last practice recorded BP readings:  BP Readings from Last 3 Encounters:  12/30/24 (!) 157/53  12/07/24 138/72  12/01/24 126/62   Most recent eGFR/CrCl:  Lab Results  Component Value Date   EGFR 58 (L) 11/05/2024    No  components found for: CRCL  Evaluation of current treatment plan related to hypertension self management and patient's adherence to plan as established by provider Provided education to patient re: stroke prevention, s/s of heart attack and stroke Reviewed medications with patient and discussed importance of compliance Counseled on adverse effects of illicit drug and excessive alcohol use in patients with high blood pressure  Counseled on the importance of exercise goals with target of 150 minutes per week Discussed plans with patient for ongoing care management follow up and provided patient with direct contact information for care management team Advised patient, providing education and rationale, to monitor blood pressure daily and record, calling PCP for findings outside established parameters Reviewed scheduled/upcoming provider appointments including:  Advised patient to discuss elevated home BP readings with provider Provided education on prescribed diet DASH/Low Sodium Discussed complications of poorly controlled blood pressure such as heart disease, stroke, circulatory complications, vision complications, kidney impairment, sexual dysfunction Screening for signs and symptoms of depression related to chronic disease state  Assessed social determinant of health barriers  Patient Self-Care Activities:  Attend all scheduled provider appointments Call provider office for new concerns or questions  Take medications as prescribed   check blood pressure daily keep a blood pressure log call doctor for signs and symptoms of high blood pressure take medications for blood pressure exactly as prescribed report new symptoms to your doctor eat more whole grains, fruits and vegetables, lean meats and healthy fats  Plan:  Telephone follow up appointment with care management team member scheduled for:  01-14-2025 at 9:30 am             Please call the Suicide and Crisis Lifeline:  988 call the [place]  National Suicide Prevention Lifeline: (317) 409-5808 or TTY: 5038713998 TTY 407-495-9022) to talk to a trained counselor call 1-800-273-TALK (toll free, 24 hour hotline) if you are experiencing a Mental Health or Behavioral Health Crisis or need someone to talk to.  Patient verbalized understanding of Care plan and visit instructions communicated this visit  Kelsey Nelson, BSN RN [facility], [facility] RN Care Manager Direct Dial: [phone]  Fax: [phone]

## 2024-12-31 NOTE — Patient Outreach (Signed)
 Complex Care Management   Visit Note  12/31/2024  Name:  Kelsey Nelson MRN: [id number] DOB: 02-20-1934  Situation: Referral received for Complex Care Management related to Heart Failure and HTN I obtained verbal consent from Patient.  Visit completed with Patient  on the phone  Background:   Past Medical History:  Diagnosis Date   Acute cystitis with hematuria 10/26/2013   Allergic rhinitis 11/08/2017   Blindness of right eye    BMI between 19-24,adult 2010 145 LBS   Diverticula of small intestine Dec 2010   Diverticulosis       DIVERTICULOSIS, SIGMOID COLON 03/31/2008   Qualifier: Diagnosis of  By: Tita Lipps  Noted on abdominal and pelvic scan in 04/2011.  Extensive colonic diverticulosis , and possible muscular hypertrophy at rectosigmoid junction    Family hx of colon cancer 11/09/2013   Functional constipation 2010   GERD (gastroesophageal reflux disease)    HTN (hypertension)    Hyperlipemia    Pancreatic cyst 02/05/2013   Renal cyst 05/27/2011   Right eye trauma 1949   Loss of vision resulted at age 89   Schatzki's ring Dec 2010   Skin lesions, generalized 06/19/2015   Tubular adenoma 11/09/2013    Assessment: Patient Reported Symptoms:  Cognitive Cognitive Status: Struggling with memory recall, Able to follow simple commands, Alert and oriented to person, place, and time Cognitive/Intellectual Conditions Management [RPT]: None reported or documented in medical history or problem list   Health Maintenance Behaviors: Annual physical exam Healing Pattern: Average Health Facilitated by: Rest  Neurological Neurological Review of Symptoms: No symptoms reported Neurological Management Strategies: Routine screening Neurological Self-Management Outcome: 5 (very good)  HEENT HEENT Symptoms Reported: Tinnitus HEENT Management Strategies: Routine screening HEENT Self-Management Outcome: 3 (uncertain) HEENT Comment: reports some irritation with the eye prosthesis changed     Cardiovascular Cardiovascular Symptoms Reported: No symptoms reported Cardiovascular Management Strategies: Routine screening, Weight management Do You Have a Working Readable Scale?: Yes Weight: 117 lb 11.2 oz (53.4 kg) (patient reported) Cardiovascular Self-Management Outcome: 4 (good)  Respiratory Respiratory Symptoms Reported: No symptoms reported Respiratory Management Strategies: Routine screening Respiratory Self-Management Outcome: 5 (very good)  Endocrine Endocrine Symptoms Reported: No symptoms reported Is patient diabetic?: No Endocrine Self-Management Outcome: 5 (very good)  Gastrointestinal Gastrointestinal Symptoms Reported: No symptoms reported Gastrointestinal Self-Management Outcome: 4 (good)    Genitourinary Genitourinary Symptoms Reported: No symptoms reported Genitourinary Self-Management Outcome: 5 (very good)  Integumentary Integumentary Symptoms Reported: No symptoms reported Skin Management Strategies: Routine screening Skin Self-Management Outcome: 5 (very good)  Musculoskeletal Musculoskelatal Symptoms Reviewed: No symptoms reported Musculoskeletal Management Strategies: Routine screening Musculoskeletal Self-Management Outcome: 5 (very good) Falls in the past year?: No Number of falls in past year: 1 or less Was there an injury with Fall?: No Fall Risk Category Calculator: 0 Patient Fall Risk Level: Low Fall Risk Patient at Risk for Falls Due to: No Fall Risks Fall risk Follow up: Falls evaluation completed  Psychosocial Psychosocial Symptoms Reported: No symptoms reported Behavioral Management Strategies: Coping strategies Behavioral Health Self-Management Outcome: 4 (good) Major Change/Loss/Stressor/Fears (CP): Denies Techniques to Cope with Loss/Stress/Change: Not applicable Quality of Family Relationships: supportive, involved Do you feel physically threatened by others?: No    12/31/2024    PHQ2-9 Depression Screening   Little interest or  pleasure in doing things Several days  Feeling down, depressed, or hopeless Not at all  PHQ-2 - Total Score 1  Trouble falling or staying asleep, or sleeping too much  Feeling tired or having little energy    Poor appetite or overeating     Feeling bad about yourself - or that you are a failure or have let yourself or your family down    Trouble concentrating on things, such as reading the newspaper or watching television    Moving or speaking so slowly that other people could have noticed.  Or the opposite - being so fidgety or restless that you have been moving around a lot more than usual    Thoughts that you would be better off dead, or hurting yourself in some way    PHQ2-9 Total Score    If you checked off any problems, how difficult have these problems made it for you to do your work, take care of things at home, or get along with other people    Depression Interventions/Treatment      Today's Vitals   12/31/24 0926  Weight: 117 lb 11.2 oz (53.4 kg)   Pain Scale: 0-10 Pain Score: 0-No pain  Medications Reviewed Today     Reviewed by Bertrum Rosina HERO, RN (Registered Nurse) on 12/31/24 at 0911  Med List Status: <None>   Medication Order Taking? Sig Documenting Provider Last Dose Status Informant  Cholecalciferol (VITAMIN D3 PO) [id number] Yes Take 25 mcg by mouth daily. [provider]  Active Self, Pharmacy Records  diphenhydrAMINE  (BENADRYL  ALLERGY) 25 mg capsule [id number] Yes Takes one at bedtime then 3 to 4 hours later takes a 2nd one [name], MD  Active Self, Pharmacy Records           Med Note ANNELL, JEANNINE HERO   Tue Oct 26, 2024  8:12 AM) As needed  donepezil  (ARICEPT ) 10 MG tablet [id number] Yes Take 1 tablet (10 mg total) by mouth at bedtime. [name], MD  Active Self, Pharmacy Records  empagliflozin  (JARDIANCE ) 10 MG TABS tablet [id number] Yes Take 1 tablet (10 mg total) by mouth daily. [name], MD  Active   erythromycin   ophthalmic ointment [id number]  Apply 0.5 inches to eye 4 (four) times daily.  Patient not taking: Reported on 12/31/2024   [provider]  Active Self, Pharmacy Records  estradiol  (ESTRACE ) 0.1 MG/GM vaginal cream [id number] Yes Discard plastic applicator. Insert a blueberry size amount (approximately 1 gram) of cream on fingertip inside vagina at bedtime every night for 1 week then every other night. For long term use. Gerldine Lauraine BROCKS, FNP  Active Self, Pharmacy Records           Med Note ANNELL, JEANNINE HERO   Tue Oct 26, 2024  8:12 AM) As needed  fexofenadine  (ALLEGRA  ALLERGY) 180 MG tablet [id number] Yes Take 1 tablet (180 mg total) by mouth every morning. Tobie Arleta SQUIBB, MD  Active Self, Pharmacy Records           Med Note Dunbar, [facility] M   Tue Apr 22, 2023  9:36 AM) As needed   fluticasone  (FLONASE ) 50 MCG/ACT nasal spray [id number] Yes Place 1 spray into both nostrils daily. Tobie Arleta SQUIBB, MD  Active Self, Pharmacy Records  furosemide  (LASIX ) 20 MG tablet [id number] Yes Take 1 tablet (20 mg total) by mouth every other day.  Patient taking differently: Take 20 mg by mouth as needed. Take one (1) tablet by mouth for weight gain of three (3) pounds over night or five (5) pounds in a week.   Meng, Hao, [place]  Active   linaclotide  (LINZESS ) 72 MCG capsule [id number]  Take 72 mcg by mouth every other day.  Patient not taking: Reported on 12/31/2024   [provider]  Active   MAGNESIUM  PO [id number] Yes Take by mouth. [provider]  Active Self, Pharmacy Records           Med Note ANNELL, JEANNINE HERO   Tue Oct 26, 2024  8:13 AM) Patient takes 1 tablet by mouth Daily.  Multiple Vitamin (MULTIVITAMIN PO) [id number] Yes Take by mouth. [provider]  Active Self, Pharmacy Records           Med Note ANNELL, JEANNINE HERO   Tue Oct 26, 2024  8:14 AM) Patient takes 1 tablet by mouth Daily.  omalizumab  (XOLAIR ) prefilled syringe 300 mg [id number]   Iva Marty Saltness, MD  Active             Med Note CARLYLE, TRACY P   Wed Aug 11, 2024  1:14 PM) Patient not taking  Omega-3 Fatty Acids (OMEGA 3 FISH OIL PO) [id number] Yes Take 1 capsule by mouth daily. [provider]  Active Self, Pharmacy Records  omeprazole  [facility]) 40 MG capsule [id number] Yes Take 40 mg by mouth daily. [provider]  Active   OVER THE COUNTER MEDICATION [id number] Yes Multi Collagen Protein - Patient states that she takes 1 scoop a day. [provider]  Active Self, Pharmacy Records  OVER THE COUNTER MEDICATION [id number] Yes One daily hylands leg cramp relief [provider]  Active Self, Pharmacy Records  OVER THE COUNTER MEDICATION [id number] Yes Omega xl joint and muscle support daily [provider]  Active Self, Pharmacy Records  polyethylene glycol (MIRALAX ) 17 g packet [id number] Yes Take 17 g by mouth daily. Leath-Warren, Etta PARAS, NP  Active Self, Pharmacy Records           Med Note Brighton, RICHERD GRADE   Fri Oct 29, 2024 11:15 AM) Taking as needed  prednisoLONE  acetate (PRED FORTE ) 1 % ophthalmic suspension [id number] Yes SMARTSIG:In Eye(s) [provider]  Active Self, Pharmacy Records           Med Note ANNELL, JEANNINE HERO   Tue Oct 26, 2024  8:14 AM) As needed  predniSONE  (DELTASONE ) 5 MG tablet [id number]  Take 1 tablet (5 mg total) by mouth 2 (two) times daily for 5 days. [name], MD  Active   ramipril  (ALTACE ) 10 MG capsule [id number] Yes Take one capsule by mouth once daily for blood pressure [name], MD  Active Self, Pharmacy Records  senna-docusate (SENOKOT-S) 8.6-50 MG tablet [id number] Yes Take 2 tablets by mouth at bedtime as needed for mild constipation. Leath-Warren, Etta PARAS, NP  Active Self, Pharmacy Records  zolpidem  (AMBIEN ) 5 MG tablet [id number] Yes Take 1 tablet (5 mg total) by mouth at bedtime as needed for sleep. Tobie Suzzane POUR, MD  Active             Recommendation:   Continue Current Plan of  Care  Follow Up Plan:   Telephone follow-up in 2 weeks  Rosina Forte, BSN RN [facility], [facility] RN Care Manager Direct Dial: [phone]  Fax: [phone]

## 2025-01-03 ENCOUNTER — Encounter: Payer: Self-pay | Admitting: Family Medicine

## 2025-01-03 DIAGNOSIS — I351 Nonrheumatic aortic (valve) insufficiency: Secondary | ICD-10-CM | POA: Insufficient documentation

## 2025-01-03 DIAGNOSIS — F321 Major depressive disorder, single episode, moderate: Secondary | ICD-10-CM | POA: Insufficient documentation

## 2025-01-03 NOTE — Assessment & Plan Note (Signed)
 Has been on zoloft  in the past, no interest in medication or therapy currently but will re address, therapy would be beneficial if she can engage with a therapist

## 2025-01-03 NOTE — Assessment & Plan Note (Signed)
 No s/s of heart failure at visit , has had cardiology follow up and has upcoming visit scheduled

## 2025-01-03 NOTE — Assessment & Plan Note (Signed)
 Cardiology f/u in 3 weeks

## 2025-01-03 NOTE — Assessment & Plan Note (Signed)
 Continue aricept 

## 2025-01-03 NOTE — Assessment & Plan Note (Signed)
 Controlled, no change in medication DASH diet and commitment to daily physical activity for a minimum of 30 minutes discussed and encouraged, as a part of hypertension management. The importance of attaining a healthy weight is also discussed.     12/31/2024    9:26 AM 12/30/2024    1:47 PM 12/30/2024    1:38 PM 12/07/2024   10:50 AM 12/07/2024   10:45 AM 12/07/2024   10:40 AM 12/01/2024   10:15 AM  BP/Weight  Systolic BP  128 842 138 144 167 126  Diastolic BP  60 53 72 74 76 62  Wt. (Lbs) 117.7  122   123.4   BMI 19.29 kg/m2  19.99 kg/m2   20.22 kg/m2      '

## 2025-01-03 NOTE — Assessment & Plan Note (Signed)
 Short course prednisone  prescribed

## 2025-01-03 NOTE — Assessment & Plan Note (Addendum)
 Initial response to ambien  5 mg however nothing after 2 days, advised trial of zolpidem  10 mg taking 2 five mg tabs to call back in 7 toi 10 days. Would not maintain on nightly zolpidem  at this dose , problem is CHRONIC, maximum use would be 3 mights per week Sleep hygiene reviewed no script sent at this visit , she has 5 mg tabs which she is to use as above

## 2025-01-03 NOTE — Progress Notes (Signed)
 "  Kelsey Nelson     MRN: [id number]      DOB: 1934/06/17  Chief Complaint  Patient presents with   Hypertension    Follow up    Insomnia    Pt having ongoing insomnia. Saw Dr. Tobie last month but no improvement. No change with ambien     Eye Problem    Pt states she had her prosthetic eye replaced yesterday and has had irritation since. Asking for any advice on relief     HPI Kelsey Nelson is here for follow up and re-evaluation of chronic medical conditions, medication management and review of any available recent lab and radiology data.  Concerns as above Also c/o  intermittent lower abdominal pain no trigger or relieving factor, states she is tired of itall ready to go home , not suicidal or homicidal, knows her husband needs her No one is lestening to her complaints  No taste  ROS Denies recent fever or chills. Denies sinus pressure, nasal congestion, ear pain or sore throat. Denies chest congestion, productive cough or wheezing. Denies chest pains, palpitations and leg swelling Denies  nausea, vomiting,diarrhea or constipation.   Denies dysuria, frequency, hesitancy or incontinence. Denies joint pain, swelling and limitation in mobility. Denies headaches, seizures, numbness, or tingling.  Denies skin break down or rash.   PE  BP 128/60   Pulse 87   Resp 16   Ht 5' 5.5 (1.664 m)   Wt 122 lb (55.3 kg)   SpO2 93%   BMI 19.99 kg/m    Patient alert and oriented and in no cardiopulmonary distress.  HEENT: No facial asymmetry, EOMI,     Neck supple .  Chest: Clear to auscultation bilaterally.  CVS: S1, S2 diastolic  murmurs, no S3.Regular rate.  ABD: Soft non tender.   Ext: No edema  MS: Adequate ROM spine, shoulders, hips and knees.  Skin: Intact, no ulcerations or rash noted.  Psych: Good eye contact,  anxious and tearful at times , mildly  depressed appearing.  CNS: CN 2-12 intact, power,  normal throughout.no focal deficits noted.   Assessment &  Plan  HTN (hypertension) Controlled, no change in medication DASH diet and commitment to daily physical activity for a minimum of 30 minutes discussed and encouraged, as a part of hypertension management. The importance of attaining a healthy weight is also discussed.     12/31/2024    9:26 AM 12/30/2024    1:47 PM 12/30/2024    1:38 PM 12/07/2024   10:50 AM 12/07/2024   10:45 AM 12/07/2024   10:40 AM 12/01/2024   10:15 AM  BP/Weight  Systolic BP  128 842 138 144 167 126  Diastolic BP  60 53 72 74 76 62  Wt. (Lbs) 117.7  122   123.4   BMI 19.29 kg/m2  19.99 kg/m2   20.22 kg/m2      '  Loss of taste Short course prednisone  prescribed  MCI (mild cognitive impairment) Continue aricept    Insomnia Initial response to ambien  5 mg however nothing after 2 days, advised trial of zolpidem  10 mg taking 2 five mg tabs to call back in 7 toi 10 days. Would not maintain on nightly zolpidem  at this dose , problem is CHRONIC, maximum use would be 3 mights per week Sleep hygiene reviewed no script sent at this visit , she has 5 mg tabs which she is to use as above  Acute CHF (HCC) No s/s of heart failure at visit ,  has had cardiology follow up and has upcoming visit scheduled  Aortic incompetence Cardiology f/u in 3 weeks  Depression, major, single episode, moderate (HCC) Has been on zoloft  in the past, no interest in medication or therapy currently but will re address, therapy would be beneficial if she can engage with a therapist  "

## 2025-01-10 ENCOUNTER — Encounter: Payer: Self-pay | Admitting: Adult Health

## 2025-01-10 ENCOUNTER — Ambulatory Visit: Admitting: Adult Health

## 2025-01-10 VITALS — BP 146/55 | HR 68 | Ht 65.0 in | Wt 122.0 lb

## 2025-01-10 DIAGNOSIS — N993 Prolapse of vaginal vault after hysterectomy: Secondary | ICD-10-CM

## 2025-01-10 DIAGNOSIS — I10 Essential (primary) hypertension: Secondary | ICD-10-CM

## 2025-01-10 DIAGNOSIS — Z90721 Acquired absence of ovaries, unilateral: Secondary | ICD-10-CM | POA: Diagnosis not present

## 2025-01-10 DIAGNOSIS — N819 Female genital prolapse, unspecified: Secondary | ICD-10-CM

## 2025-01-10 DIAGNOSIS — L292 Pruritus vulvae: Secondary | ICD-10-CM | POA: Diagnosis not present

## 2025-01-10 DIAGNOSIS — N8110 Cystocele, unspecified: Secondary | ICD-10-CM

## 2025-01-10 DIAGNOSIS — Z90710 Acquired absence of both cervix and uterus: Secondary | ICD-10-CM

## 2025-01-10 NOTE — Progress Notes (Addendum)
" °  Subjective:     Patient ID: Kelsey Nelson, female   DOB: October 20, 1934, 89 y.o.   MRN: [id number]  HPI Kelsey Nelson is a 89 year old white female, sp hysterectomy, in complaining of vuvla itching since Friday, ha tired temovate , silver gel, and estrogen cream with out relief.  PCP is Dr Antonetta   Review of Systems Vulva itching since Friday Reviewed past medical,surgical, social and family history. Reviewed medications and allergies.     Objective:   Physical Exam BP (!) 146/55 (BP Location: Left Arm, Patient Position: Sitting, Cuff Size: Normal)   Pulse 68   Ht 5' 5 (1.651 m)   Wt 122 lb (55.3 kg)   BMI 20.30 kg/m     Skin warm and dry.Pelvic: external genitalia is normal in appearance. Vulva is red, skin is thin,painted with gentian violet, vagina is pale,urethra has no lesions or masses noted, cervix and uterus are absent, +prolapse and cystocele, (pessary fell out and does not want replaced),  adnexa: no masses or tenderness noted. Bladder is non tender and no masses felt.  Assessment:     1. Vulvar itching (Primary) Vulva is red  Just pat today, and do not use any creams  Painted with gentian violet   2. Cystocele, unspecified  3. Vaginal vault prolapse  4. S/P hysterectomy with oophorectomy    5. Hypertension Take meds and follow up with PCP Plan:     Follow up in 4 days, but if better can cancel    "

## 2025-01-12 ENCOUNTER — Ambulatory Visit: Admitting: Adult Health

## 2025-01-12 ENCOUNTER — Encounter: Payer: Self-pay | Admitting: Adult Health

## 2025-01-12 VITALS — BP 156/62 | HR 73 | Ht 65.0 in | Wt 122.0 lb

## 2025-01-12 DIAGNOSIS — B9689 Other specified bacterial agents as the cause of diseases classified elsewhere: Secondary | ICD-10-CM | POA: Insufficient documentation

## 2025-01-12 DIAGNOSIS — N9089 Other specified noninflammatory disorders of vulva and perineum: Secondary | ICD-10-CM | POA: Insufficient documentation

## 2025-01-12 DIAGNOSIS — N760 Acute vaginitis: Secondary | ICD-10-CM | POA: Diagnosis not present

## 2025-01-12 DIAGNOSIS — Z90710 Acquired absence of both cervix and uterus: Secondary | ICD-10-CM

## 2025-01-12 DIAGNOSIS — N898 Other specified noninflammatory disorders of vagina: Secondary | ICD-10-CM | POA: Insufficient documentation

## 2025-01-12 DIAGNOSIS — L292 Pruritus vulvae: Secondary | ICD-10-CM

## 2025-01-12 LAB — POCT WET PREP (WET MOUNT)
Clue Cells Wet Prep Whiff POC: NEGATIVE
WBC, Wet Prep HPF POC: POSITIVE

## 2025-01-12 MED ORDER — METRONIDAZOLE 0.75 % VA GEL: 1.0000 | Freq: Every day | VAGINAL | 0 refills | Status: DC

## 2025-01-12 MED ORDER — FLUCONAZOLE 150 MG PO TABS: ORAL_TABLET | ORAL | 1 refills | Status: DC

## 2025-01-12 MED ORDER — NYSTATIN-TRIAMCINOLONE 100000-0.1 UNIT/GM-% EX OINT: 1.0000 | TOPICAL_OINTMENT | Freq: Two times a day (BID) | CUTANEOUS | 0 refills | Status: DC

## 2025-01-12 NOTE — Progress Notes (Signed)
" °  Subjective:     Patient ID: [name], female   DOB: 02-21-1934, 89 y.o.   MRN: [id number]  HPI Frimy is a 89 year old white female, married, sp hysterectomy in complaining of itching in vulva area and now vagina, which treated with gentian violet on Monday. She says she was miserable last night.  PCP is Dr Antonetta  Review of Systems +itching in vulva area and now vagina Reviewed past medical,surgical, social and family history. Reviewed medications and allergies.     Objective:   Physical Exam BP (!) 156/62 (BP Location: Left Arm, Patient Position: Sitting, Cuff Size: Normal)   Pulse 73   Ht 5' 5 (1.651 m)   Wt 122 lb (55.3 kg)   BMI 20.30 kg/m     Skin warm and dry.Pelvic: external genitalia is red and irritated, vagina: white discharge without odor at introitus, deferred pelvic exam. Wet prep: + for clue cells and +WBCs.  Upstream - 01/12/25 0837       Pregnancy Intention Screening   Does the patient want to become pregnant in the next year? N/A    Does the patient's partner want to become pregnant in the next year? N/A    Would the patient like to discuss contraceptive options today? N/A      Contraception Wrap Up   Current Method Abstinence;Hysterectomy    End Method Abstinence;Hysterectomy    Contraception Counseling Provided No         Examination chaperoned by Clarita Salt LPN and NP student  ONEIDA Minerva.  Assessment:     1. Vulvar itching (Primary) Still itching She is on jardiance  Will rx mycolog   2. Vaginal itching +itching  Will rx diflucan   - POCT Wet Prep ([place])  3. Vaginal discharge White discharge  - POCT Wet Prep [employer])  4. BV (bacterial vaginosis) +clue cells on wet prep   Will rx Metrogel   Meds ordered this encounter  Medications   fluconazole  (DIFLUCAN ) 150 MG tablet    Sig: Take 1 now and 1 in 3 days and again in 3 days if needed    Dispense:  3 tablet    Refill:  1    Supervising Provider:   JAYNE MINDER H [[id number]]    nystatin -triamcinolone  ointment (MYCOLOG)    Sig: Apply 1 Application topically 2 (two) times daily.    Dispense:  30 g    Refill:  0    Supervising Provider:   JAYNE MINDER H [[id number]]   metroNIDAZOLE  (METROGEL ) 0.75 % vaginal gel    Sig: Place 1 Applicatorful vaginally at bedtime.    Dispense:  70 g    Refill:  0    Supervising Provider:   JAYNE, LUTHER H [[id number]]     5. Vulvar irritation Will rx mycolog Can try Aquaphor 3N1 cream     Plan:     Follow up in 2 days for recheck     "

## 2025-01-14 ENCOUNTER — Ambulatory Visit: Admitting: Adult Health

## 2025-01-14 ENCOUNTER — Encounter: Payer: Self-pay | Admitting: *Deleted

## 2025-01-14 ENCOUNTER — Encounter: Payer: Self-pay | Admitting: Adult Health

## 2025-01-14 ENCOUNTER — Telehealth: Payer: Self-pay | Admitting: *Deleted

## 2025-01-14 VITALS — BP 159/58 | HR 75 | Ht 65.0 in | Wt 122.0 lb

## 2025-01-14 DIAGNOSIS — N898 Other specified noninflammatory disorders of vagina: Secondary | ICD-10-CM

## 2025-01-14 DIAGNOSIS — L292 Pruritus vulvae: Secondary | ICD-10-CM | POA: Diagnosis not present

## 2025-01-14 NOTE — Patient Instructions (Signed)
 [name] - I am sorry I was unable to reach you today for our scheduled appointment. I work with [name], MD and am calling to support your healthcare needs. Please contact me at [phone] at your earliest convenience. I look forward to speaking with you soon.   Thank you,  Rosina Forte, BSN RN [facility], [facility] RN Care Manager Direct Dial: [phone]  Fax: [phone]

## 2025-01-14 NOTE — Progress Notes (Signed)
" °  Subjective:     Patient ID: Kelsey Nelson, female   DOB: 03/01/1934, 89 y.o.   MRN: [id number]  HPI Kelsey Nelson is a 89 year old white female, married, sp hysterectomy, back in follow up on itching in vulva and vagina area and is feeling a little better. She was seen 01/10/25 and painted with gentian violet and then 01/12/25 and prescribed diflucan  she miss read and took one daily for 3 days and mycolog and metrogel .  PCP is Dr Antonetta  Review of Systems Feels a little better Still itches Reviewed past medical,surgical, social and family history. Reviewed medications and allergies.     Objective:   Physical Exam BP (!) 159/58 (BP Location: Left Arm, Patient Position: Sitting, Cuff Size: Normal)   Pulse 75   Ht 5' 5 (1.651 m)   Wt 122 lb (55.3 kg)   BMI 20.30 kg/m     Skin warm and dry.Pelvic: external genitalia is no longer red, vagina: has some redness at introitus but not as much.   Upstream - 01/14/25 1108       Pregnancy Intention Screening   Does the patient want to become pregnant in the next year? N/A    Does the patient's partner want to become pregnant in the next year? N/A    Would the patient like to discuss contraceptive options today? N/A      Contraception Wrap Up   Current Method Abstinence;Hysterectomy    End Method Abstinence;Hysterectomy    Contraception Counseling Provided No         Examination chaperoned by Kelsey Salt LPN  Assessment:     1. Vulvar itching (Primary) Is feeling a little better Continue mycolog  2. Vaginal itching Continue metrogel      Plan:    Can get aqua phor 3N1 cream  Follow up in 1 week fir not better     "

## 2025-01-19 ENCOUNTER — Other Ambulatory Visit: Payer: Self-pay | Admitting: *Deleted

## 2025-01-19 ENCOUNTER — Ambulatory Visit: Payer: Self-pay | Admitting: Family Medicine

## 2025-01-19 NOTE — Patient Outreach (Signed)
 Complex Care Management   Visit Note  01/19/2025  Name:  Kelsey Nelson MRN: [id number] DOB: [birth year]  Situation: Referral received for Complex Care Management related to Heart Failure and HTN I obtained verbal consent from Patient.  Visit completed with Patient  on the phone  Background:   Past Medical History:  Diagnosis Date   Acute cystitis with hematuria 10/26/2013   Allergic rhinitis 11/08/2017   Blindness of right eye    BMI between 19-24,adult 2010 145 LBS   Diverticula of small intestine Dec 2010   Diverticulosis    [place]   DIVERTICULOSIS, SIGMOID COLON 03/31/2008   Qualifier: Diagnosis of  By: Tita Lipps  Noted on abdominal and pelvic scan in 04/2011.  Extensive colonic diverticulosis , and possible muscular hypertrophy at rectosigmoid junction    Family hx of colon cancer 11/09/2013   Functional constipation 2010   GERD (gastroesophageal reflux disease)    HTN (hypertension)    Hyperlipemia    Pancreatic cyst 02/05/2013   Renal cyst 05/27/2011   Right eye trauma 1949   Loss of vision resulted at age 50   Schatzki's ring Dec 2010   Skin lesions, generalized 06/19/2015   Tubular adenoma 11/09/2013    Assessment: Patient Reported Symptoms:  Cognitive Cognitive Status: Struggling with memory recall, Alert and oriented to person, place, and time, Able to follow simple commands Cognitive/Intellectual Conditions Management [RPT]: None reported or documented in medical history or problem list   Health Maintenance Behaviors: Annual physical exam Healing Pattern: Average Health Facilitated by: Rest  Neurological Neurological Review of Symptoms: No symptoms reported Neurological Management Strategies: Routine screening Neurological Self-Management Outcome: 5 (very good)  HEENT HEENT Symptoms Reported: No symptoms reported HEENT Management Strategies: Routine screening HEENT Self-Management Outcome: 4 (good)    Cardiovascular Cardiovascular Symptoms Reported: No symptoms  reported Does patient have uncontrolled Hypertension?: No Cardiovascular Management Strategies: Routine screening Do You Have a Working Readable Scale?: Yes Weight: 117 lb (53.1 kg) (patient reported)  Respiratory Respiratory Symptoms Reported: Shortness of breath Respiratory Management Strategies: Routine screening Respiratory Self-Management Outcome: 4 (good)  Endocrine Endocrine Symptoms Reported: No symptoms reported Is patient diabetic?: No Endocrine Self-Management Outcome: 4 (good)  Gastrointestinal Gastrointestinal Symptoms Reported: No symptoms reported Gastrointestinal Self-Management Outcome: 4 (good)    Genitourinary Genitourinary Symptoms Reported: No symptoms reported Genitourinary Self-Management Outcome: 4 (good)  Integumentary Integumentary Symptoms Reported: No symptoms reported Skin Management Strategies: Routine screening Skin Self-Management Outcome: 4 (good)  Musculoskeletal Musculoskelatal Symptoms Reviewed: No symptoms reported Musculoskeletal Management Strategies: Routine screening Musculoskeletal Self-Management Outcome: 4 (good) Falls in the past year?: No Number of falls in past year: 1 or less Was there an injury with Fall?: No Fall Risk Category Calculator: 0 Patient Fall Risk Level: Low Fall Risk Patient at Risk for Falls Due to: No Fall Risks Fall risk Follow up: Falls evaluation completed  Psychosocial Psychosocial Symptoms Reported: No symptoms reported Behavioral Management Strategies: Coping strategies Behavioral Health Self-Management Outcome: 4 (good) Major Change/Loss/Stressor/Fears (CP): Denies Techniques to Cope with Loss/Stress/Change: Not applicable      01/19/2025    PHQ2-9 Depression Screening   Little interest or pleasure in doing things Not at all  Feeling down, depressed, or hopeless Not at all  PHQ-2 - Total Score 0  Trouble falling or staying asleep, or sleeping too much    Feeling tired or having little energy    Poor  appetite or overeating     Feeling bad about yourself - or that you are a  failure or have let yourself or your family down    Trouble concentrating on things, such as reading the newspaper or watching television    Moving or speaking so slowly that other people could have noticed.  Or the opposite - being so fidgety or restless that you have been moving around a lot more than usual    Thoughts that you would be better off dead, or hurting yourself in some way    PHQ2-9 Total Score    If you checked off any problems, how difficult have these problems made it for you to do your work, take care of things at home, or get along with other people    Depression Interventions/Treatment      Today's Vitals   01/19/25 1055  Weight: 117 lb (53.1 kg)   Pain Score: 0-No pain  Medications Reviewed Today     Reviewed by Bertrum Rosina HERO, RN (Registered Nurse) on 01/19/25 at 1053  Med List Status: <None>   Medication Order Taking? Sig Documenting Provider Last Dose Status Informant  Cholecalciferol (VITAMIN D3 PO) [id number] Yes Take 25 mcg by mouth daily. [provider]  Active Self, Pharmacy Records  diphenhydrAMINE  (BENADRYL  ALLERGY) 25 mg capsule [id number] Yes Takes one at bedtime then 3 to 4 hours later takes a 2nd one [name], MD  Active Self, Pharmacy Records           Med Note ANNELL, JEANNINE HERO   Tue Oct 26, 2024  8:12 AM) As needed  donepezil  (ARICEPT ) 10 MG tablet [id number] Yes Take 1 tablet (10 mg total) by mouth at bedtime. [name], MD  Active Self, Pharmacy Records  empagliflozin  (JARDIANCE ) 10 MG TABS tablet [id number] Yes Take 1 tablet (10 mg total) by mouth daily. [name], MD  Active   erythromycin  ophthalmic ointment [id number] Yes Apply 0.5 inches to eye 4 (four) times daily. [provider]  Active Self, Pharmacy Records  estradiol  (ESTRACE ) 0.1 MG/GM vaginal cream [id number] Yes Discard plastic applicator. Insert a blueberry size  amount (approximately 1 gram) of cream on fingertip inside vagina at bedtime every night for 1 week then every other night. For long term use. Gerldine Lauraine BROCKS, FNP  Active Self, Pharmacy Records           Med Note ANNELL, JEANNINE HERO   Tue Oct 26, 2024  8:12 AM) As needed  fexofenadine  (ALLEGRA  ALLERGY) 180 MG tablet [id number] Yes Take 1 tablet (180 mg total) by mouth every morning. Tobie Arleta SQUIBB, MD  Active Self, Pharmacy Records           Med Note Ruby, [facility] M   Tue Apr 22, 2023  9:36 AM) As needed   fluconazole  (DIFLUCAN ) 150 MG tablet [id number] Yes Take 1 now and 1 in 3 days and again in 3 days if needed Signa [name] LABOR, NP  Active   fluticasone  (FLONASE ) 50 MCG/ACT nasal spray [id number] Yes Place 1 spray into both nostrils daily. Tobie Arleta SQUIBB, MD  Active Self, Pharmacy Records  MAGNESIUM  PO [id number] Yes Take by mouth. [provider]  Active Self, Pharmacy Records           Med Note ANNELL, JEANNINE HERO   Tue Oct 26, 2024  8:13 AM) Patient takes 1 tablet by mouth Daily.  metroNIDAZOLE  (METROGEL ) 0.75 % vaginal gel [id number] Yes Place 1 Applicatorful vaginally at bedtime. Signa [name] LABOR, NP  Active   Multiple Vitamin (MULTIVITAMIN PO) [id number] Yes Take by  mouth. [provider]  Active Self, Pharmacy Records           Med Note ANNELL, JEANNINE HERO   Tue Oct 26, 2024  8:14 AM) Patient takes 1 tablet by mouth Daily.  nystatin -triamcinolone  (MYCOLOG II) cream [id number] Yes Apply topically 2 (two) times daily. [provider]  Active   nystatin -triamcinolone  ointment [facility]) [id number] Yes Apply 1 Application topically 2 (two) times daily. Signa [name] LABOR, NP  Active   omalizumab  (XOLAIR ) prefilled syringe 300 mg [id number]   Iva Marty Saltness, MD  Active            Med Note CARLYLE, TRACY P   Wed Aug 11, 2024  1:14 PM) Patient not taking  Omega-3 Fatty Acids (OMEGA 3 FISH OIL PO) [id number] Yes Take 1 capsule by mouth daily. [provider]   Active Self, Pharmacy Records  omeprazole  [facility]) 40 MG capsule [id number] Yes Take 40 mg by mouth daily. [provider]  Active   OVER THE COUNTER MEDICATION [id number] Yes Multi Collagen Protein - Patient states that she takes 1 scoop a day. [provider]  Active Self, Pharmacy Records  OVER THE COUNTER MEDICATION [id number] Yes One daily hylands leg cramp relief [provider]  Active Self, Pharmacy Records  polyethylene glycol (MIRALAX ) 17 g packet [id number] Yes Take 17 g by mouth daily. Leath-Warren, Etta PARAS, NP  Active Self, Pharmacy Records           Med Note Langford, RICHERD GRADE   Fri Oct 29, 2024 11:15 AM) Taking as needed  prednisoLONE  acetate (PRED FORTE ) 1 % ophthalmic suspension [id number] Yes SMARTSIG:In Eye(s) [provider]  Active Self, Pharmacy Records           Med Note ANNELL, JEANNINE HERO   Tue Oct 26, 2024  8:14 AM) As needed  ramipril  (ALTACE ) 10 MG capsule [id number] Yes Take one capsule by mouth once daily for blood pressure [name], MD  Active Self, Pharmacy Records  senna-docusate (SENOKOT-S) 8.6-50 MG tablet [id number] Yes Take 2 tablets by mouth at bedtime as needed for mild constipation. Leath-Warren, Etta PARAS, NP  Active Self, Pharmacy Records            Recommendation:   Continue Current Plan of Care  Follow Up Plan:   Telephone follow-up in 1 month  Rosina Forte, BSN RN [facility], [facility] RN Care Manager Direct Dial: [phone]  Fax: [phone]

## 2025-01-19 NOTE — Patient Instructions (Signed)
 Visit Information  Thank you for taking time to visit with me today. Please don't hesitate to contact me if I can be of assistance to you before our next scheduled appointment.  Your next care management appointment is by telephone on 02-18-2025 at 11:00 am  Telephone follow-up in 1 month  Please call the care guide team at [phone] if you need to cancel, schedule, or reschedule an appointment.   Please call the Suicide and Crisis Lifeline: 988 call the [place]  National Suicide Prevention Lifeline: 253-779-0714 or TTY: 424-631-0587 TTY 772-834-2374) to talk to a trained counselor call 1-800-273-TALK (toll free, 24 hour hotline) if you are experiencing a Mental Health or Behavioral Health Crisis or need someone to talk to.  Rosina Forte, BSN RN [facility], [facility] RN Care Manager Direct Dial: [phone]  Fax: [phone]

## 2025-01-20 ENCOUNTER — Encounter: Payer: Self-pay | Admitting: Adult Health

## 2025-01-20 ENCOUNTER — Ambulatory Visit: Admitting: Adult Health

## 2025-01-20 VITALS — BP 135/51 | HR 70 | Ht 65.0 in | Wt 122.0 lb

## 2025-01-20 DIAGNOSIS — L292 Pruritus vulvae: Secondary | ICD-10-CM

## 2025-01-20 NOTE — Progress Notes (Signed)
" °  Subjective:     Patient ID: Kelsey Nelson, female   DOB: [birth year], 89 y.o.   MRN: [id number]  HPI Kelsey Nelson is a 89 year old white female, married, sp hysterectomy, back in follow up on vulva itching and it is better, but still has some at times.  PCP is Dr Antonetta  Review of Systems +vulva itching, better but still has some  Reviewed past medical,surgical, social and family history. Reviewed medications and allergies.     Objective:   Physical Exam BP (!) 135/51 (BP Location: Left Arm, Patient Position: Sitting, Cuff Size: Normal)   Pulse 70   Ht 5' 5 (1.651 m)   Wt 122 lb (55.3 kg)   BMI 20.30 kg/m     Skin warm and dry.Pelvic: external genitalia is normal in appearance no lesions, vagina: +Metrogel , still slightly red at introitus,urethra has no lesions or masses noted, cervix and uterus is absent, adnexa: no masses or tenderness noted. Bladder is non tender and no masses felt.     Upstream - 01/20/25 1511       Pregnancy Intention Screening   Does the patient want to become pregnant in the next year? N/A    Does the patient's partner want to become pregnant in the next year? N/A    Would the patient like to discuss contraceptive options today? N/A      Contraception Wrap Up   Current Method Hysterectomy    End Method Hysterectomy    Contraception Counseling Provided No         Examination chaperoned by Clarita Salt LPN  Assessment:     1. Vulvar itching (Primary) Itching is better, but still has at times Continue mycolog cream Can use estrogen cream too     Plan:     Follow up prn     "

## 2025-01-22 ENCOUNTER — Other Ambulatory Visit: Payer: Self-pay | Admitting: Adult Health

## 2025-01-27 ENCOUNTER — Ambulatory Visit: Admitting: Cardiovascular Disease

## 2025-01-27 ENCOUNTER — Encounter: Payer: Self-pay | Admitting: Cardiovascular Disease

## 2025-01-27 VITALS — BP 119/43 | HR 70 | Ht 65.0 in | Wt 121.4 lb

## 2025-01-27 DIAGNOSIS — R079 Chest pain, unspecified: Secondary | ICD-10-CM

## 2025-01-27 DIAGNOSIS — I351 Nonrheumatic aortic (valve) insufficiency: Secondary | ICD-10-CM | POA: Diagnosis not present

## 2025-01-27 DIAGNOSIS — E7800 Pure hypercholesterolemia, unspecified: Secondary | ICD-10-CM | POA: Diagnosis not present

## 2025-01-27 DIAGNOSIS — R0602 Shortness of breath: Secondary | ICD-10-CM

## 2025-01-27 MED ORDER — TRAZODONE HCL 50 MG PO TABS: 50.0000 mg | ORAL_TABLET | Freq: Every day | ORAL | 0 refills | Status: AC

## 2025-01-27 NOTE — Progress Notes (Signed)
 " Cardiology Office Note   Date:  01/27/2025  ID:  Kelsey Nelson, DOB January 06, 1934, MRN [id number] PCP: Kelsey Rollene BRAVO, MD  [facility] Providers Cardiologist:  Jerel Balding, MD     History of Present Illness Kelsey Nelson is a 89 y.o. female with aortic insufficiency, chronic diastolic heart failure, hypertension, hypercholesterolemia, aortic atherosclerosis, returning in follow-up.  She is accompanied by her daughter.  She is doing pretty well.  She lives independently with her husband who has dementia.  She takes care of housework and does not have much limitation from shortness of breath, becomes easily short of breath when walking the driveway.  She does not have orthopnea or PND.  She sometimes has chest tightness when she is lying in bed at night, but usually not with activity.  She denies dizziness, palpitations, orthostatic lightheadedness, syncope.  She does not have lower extremity edema, claudication or focal neurological complaints.    Before her hospitalization October 2025 she was able to walk 3 miles a day every day.  Now is limited by dyspnea.  Her dry weight has been estimated to be 119-121 pounds.  She weighs 121 pounds today.  Her biggest complaints are noncardiac, in particular her inability to sleep more than a few hours a night.  She usually wakes up after an hour or 2 and then will stay awake for another 3 hours, all told rarely gets more than 3 to 4 hours of sleep at night.  She does not wake up due to dyspnea.  Attempts to treat with Ambien , melatonin, diphenhydramine  have not helped.  Also has a lot of problems with gas (eructation and flatulence).  Does not have abdominal pain or diarrhea. She takes donepezil  for her memory and is doing reasonably well.  She was hospitalized in October 2025 with heart failure exacerbation (exertional dyspnea, elevated BNP).  Echocardiography showed normal LVEF 55 to 60% and moderate aortic insufficiency.  She responded  well to treatment with diuretics and is currently taking empagliflozin  and is not taking loop diuretics anymore.  When she was taking both Jardiance  and torsemide she had a worsening of her creatinine.  Her most recent creatinine is normal at 0.95 and her potassium was 4.5.  I reviewed her echocardiogram and I believe that she has severe aortic insufficiency.    She has a lifelong history of severely elevated cholesterol.  Most recent labs show total close of 303, HDL 112, LDL 174, triglycerides 103.  She is not on lipid-lowering medications.  She does not have diabetes mellitus and has never smoked    Studies Reviewed      Echocardiogram 10/19/2024 On my review the aortic insufficiency is severe.  The aortic valve is trileaflet, but has significant thickening and calcification.  There does not appear to be aortic stenosis.  The AI pressure half-time is around 260 ms.  There is holodiastolic flow reversal in the descending thoracic aorta.  Risk Assessment/Calculations           Physical Exam VS:  BP (!) 119/43 (BP Location: Right Arm, Patient Position: Sitting, Cuff Size: Normal)   Pulse 70   Ht 5' 5 (1.651 m)   Wt 121 lb 6.4 oz (55.1 kg)   SpO2 97%   BMI 20.20 kg/m        Wt Readings from Last 3 Encounters:  01/27/25 121 lb 6.4 oz (55.1 kg)  01/20/25 122 lb (55.3 kg)  01/19/25 117 lb (53.1 kg)    GEN: Well  nourished, well developed in no acute distress NECK: No JVD; No carotid bruits CARDIAC: RRR, normal S1 and S2, very prominent 3/6 ventricular decrescendo murmur of aortic insufficiency heard broadly throughout the precordium, loudest at the left lower sternal border, no rubs, clicks or gallops RESPIRATORY:  Clear to auscultation without rales, wheezing or rhonchi  ABDOMEN: Soft, non-tender, non-distended EXTREMITIES:  No edema; No deformity   ASSESSMENT AND PLAN HFPEF: Appears clinically euvolemic and is at her dry weight.  NYHA functional class II.  I think the cause for  her heart failure severe aortic insufficiency.  She is doing fine on empagliflozin  monotherapy and does not need daily loop diuretics.  Continue daily weights and avoid sodium rich foods.  Her very low diastolic blood pressure does not allow additional guideline directed medical therapy. AI: On my review of her echocardiogram the aortic insufficiency is severe.  The pressure half-time is very steep and there is holodiastolic reversal of flow in the descending thoracic aorta.  This explains her very low diastolic blood pressure, which is probably responsible for her decubitus angina. Afterload reduction is prevented by very low diastolic blood pressure.  Avoid beta-blockers which would prolong diastole and worsen the impact of aortic insufficiency.  There really is no room for additional medical therapy.   I think the aortic valve has enough degenerative change likely it could hold a TAVR stent in place, but I am not sure that we will have insurance coverage if she has pure aortic insufficiency.  May have to discuss surgical valve replacement. Angina: She may have decubitus angina just from aortic insufficiency, but could well have severe CAD as well, seeing that she has very severe and untreated hypercholesterolemia for many years.  Note that she had a normal perfusion study in 2019.  Check coronary CT angiogram. HLP: Has been resistant to lipid-lowering therapy.  Will talk about this again once we have the results of the coronary CT.       Dispo: Coronary CT angiogram.  Discuss with structural heart team meeting.  Signed, Jerel Balding, MD   "

## 2025-01-27 NOTE — Patient Instructions (Addendum)
 Medication Instructions:  Trazadone 50 mg every night for sleep *If you need a refill on your cardiac medications before your next appointment, please call your pharmacy*  Lab Work: None ordered If you have labs (blood work) drawn today and your tests are completely normal, you will receive your results only by: MyChart Message (if you have MyChart) OR A paper copy in the mail If you have any lab test that is abnormal or we need to change your treatment, we will call you to review the results.  Testing/Procedures:   Your cardiac CT will be scheduled at one of the below locations:   [facility] [address]  [place], [place] [address]  If scheduled at the [facility] at [address], please enter the parking lot using the [address] entrance and use the FREE valet service at the patient drop-off area. Enter the building and check-in with registration on the main floor.  Please follow these instructions carefully (unless otherwise directed):  An IV will be required for this test and Nitroglycerin  will be given.    On the Night Before the Test: Be sure to Drink plenty of water . Do not consume any caffeinated/decaffeinated beverages or chocolate 12 hours prior to your test. Do not take any antihistamines 12 hours prior to your test.   On the Day of the Test: Drink plenty of water  until 1 hour prior to the test. Do not eat any food 1 hour prior to test. You may take your regular medications prior to the test.  If you take Furosemide /Hydrochlorothiazide/Spironolactone/Chlorthalidone, please HOLD on the morning of the test. Patients who wear a continuous glucose monitor MUST remove the device prior to scanning. FEMALES- please wear underwire-free bra if available, avoid dresses & tight clothing         After the Test: Drink plenty of water . After receiving IV contrast, you may experience a mild flushed feeling. This is  normal. On occasion, you may experience a mild rash up to 24 hours after the test. This is not dangerous. If this occurs, you can take Benadryl  25 mg, Zyrtec , Claritin, or Allegra  and increase your fluid intake. (Patients taking Tikosyn should avoid Benadryl , and may take Zyrtec , Claritin, or Allegra ) If you experience trouble breathing, this can be serious. If it is severe call 911 IMMEDIATELY. If it is mild, please call our office.  We will call to schedule your test 2-4 weeks out understanding that some insurance companies will need an authorization prior to the service being performed.   For more information and frequently asked questions, please visit our website : [email or web address]  For non-scheduling related questions, please contact the cardiac imaging nurse navigator should you have any questions/concerns: Cardiac Imaging Nurse Navigators Direct Office Dial: [phone]   For scheduling needs, including cancellations and rescheduling, please call Brittany, [phone].  For billing questions, please call [phone].    Follow-Up: At [facility], you and your health needs are our priority.  As part of our continuing mission to provide you with exceptional heart care, our providers are all part of one team.  This team includes your primary Cardiologist (physician) and Advanced Practice Providers or APPs (Physician Assistants and Nurse Practitioners) who all work together to provide you with the care you need, when you need it.  Your next appointment:   Jon Hails, PA-C- 1-2 months  Dr Francyne- 3-4 months   We recommend signing up for the patient portal called MyChart.  Sign up information is provided on this After Visit Summary.  MyChart is used to connect with patients for Virtual Visits (Telemedicine).  Patients are able to view lab/test results, encounter notes, upcoming appointments, etc.  Non-urgent messages can be sent to your provider as well.    To learn more about what you can do with MyChart, go to [email or web address].

## 2025-02-15 ENCOUNTER — Ambulatory Visit: Admitting: Obstetrics & Gynecology

## 2025-02-18 ENCOUNTER — Telehealth: Admitting: *Deleted

## 2025-03-07 ENCOUNTER — Ambulatory Visit: Admitting: Physician Assistant

## 2025-03-17 ENCOUNTER — Ambulatory Visit: Payer: Self-pay | Admitting: Family Medicine

## 2025-05-17 ENCOUNTER — Ambulatory Visit: Admitting: Cardiovascular Disease
